# Patient Record
Sex: Female | Born: 1955
Health system: Southern US, Community
[De-identification: ages and names within clinical notes are randomized; demographics above are authoritative.]

## PROBLEM LIST (undated history)

## (undated) DIAGNOSIS — L309 Dermatitis, unspecified: Secondary | ICD-10-CM

## (undated) DIAGNOSIS — M797 Fibromyalgia: Secondary | ICD-10-CM

## (undated) DIAGNOSIS — B370 Candidal stomatitis: Secondary | ICD-10-CM

## (undated) DIAGNOSIS — F419 Anxiety disorder, unspecified: Secondary | ICD-10-CM

## (undated) DIAGNOSIS — C801 Malignant (primary) neoplasm, unspecified: Secondary | ICD-10-CM

## (undated) DIAGNOSIS — N390 Urinary tract infection, site not specified: Secondary | ICD-10-CM

## (undated) DIAGNOSIS — G4730 Sleep apnea, unspecified: Secondary | ICD-10-CM

## (undated) DIAGNOSIS — R7303 Prediabetes: Secondary | ICD-10-CM

## (undated) HISTORY — DX: Urinary tract infection, site not specified: N39.0

## (undated) HISTORY — DX: Candidal stomatitis: B37.0

## (undated) HISTORY — DX: Dermatitis, unspecified: L30.9

## (undated) HISTORY — PX: HERNIA REPAIR: SHX51

---

## 2004-03-30 LAB — HM COLONOSCOPY: HM Colonoscopy: NORMAL

## 2004-06-02 ENCOUNTER — Encounter: Payer: Self-pay | Admitting: Family Medicine

## 2004-10-17 ENCOUNTER — Ambulatory Visit: Payer: Self-pay | Admitting: Oncology

## 2004-12-12 ENCOUNTER — Ambulatory Visit: Payer: Self-pay | Admitting: Oncology

## 2005-02-27 ENCOUNTER — Ambulatory Visit: Payer: Self-pay | Admitting: Oncology

## 2005-04-29 ENCOUNTER — Ambulatory Visit: Payer: Self-pay | Admitting: Oncology

## 2008-09-25 ENCOUNTER — Encounter: Payer: Self-pay | Admitting: Family Medicine

## 2008-10-04 ENCOUNTER — Ambulatory Visit: Payer: Self-pay | Admitting: Family Medicine

## 2008-10-04 DIAGNOSIS — N951 Menopausal and female climacteric states: Secondary | ICD-10-CM | POA: Insufficient documentation

## 2008-10-04 DIAGNOSIS — M797 Fibromyalgia: Secondary | ICD-10-CM | POA: Insufficient documentation

## 2008-10-09 ENCOUNTER — Telehealth: Payer: Self-pay | Admitting: Family Medicine

## 2008-12-04 ENCOUNTER — Ambulatory Visit: Payer: Self-pay | Admitting: Family Medicine

## 2008-12-04 LAB — CONVERTED CEMR LAB: LDL Cholesterol: 108 mg/dL

## 2008-12-06 LAB — CONVERTED CEMR LAB
Direct LDL: 107 mg/dL
Hgb A1c MFr Bld: 5.3 % (ref 4.6–6.5)
Total CHOL/HDL Ratio: 6
VLDL: 40.8 mg/dL — ABNORMAL HIGH (ref 0.0–40.0)

## 2008-12-20 ENCOUNTER — Ambulatory Visit: Payer: Self-pay | Admitting: Family Medicine

## 2008-12-20 DIAGNOSIS — E781 Pure hyperglyceridemia: Secondary | ICD-10-CM | POA: Insufficient documentation

## 2009-01-08 ENCOUNTER — Encounter: Payer: Self-pay | Admitting: Family Medicine

## 2009-01-10 ENCOUNTER — Ambulatory Visit: Payer: Self-pay | Admitting: Family Medicine

## 2009-01-10 ENCOUNTER — Encounter: Payer: Self-pay | Admitting: Family Medicine

## 2009-01-23 ENCOUNTER — Encounter (INDEPENDENT_AMBULATORY_CARE_PROVIDER_SITE_OTHER): Payer: Self-pay | Admitting: *Deleted

## 2009-03-13 ENCOUNTER — Ambulatory Visit: Payer: Self-pay | Admitting: Family Medicine

## 2009-03-15 LAB — CONVERTED CEMR LAB
Total CHOL/HDL Ratio: 6
Vit D, 25-Hydroxy: 20 ng/mL — ABNORMAL LOW (ref 30–89)

## 2009-05-14 ENCOUNTER — Encounter: Payer: Self-pay | Admitting: Family Medicine

## 2009-05-20 ENCOUNTER — Telehealth: Payer: Self-pay | Admitting: Family Medicine

## 2009-06-07 ENCOUNTER — Telehealth: Payer: Self-pay | Admitting: Family Medicine

## 2009-06-20 ENCOUNTER — Telehealth: Payer: Self-pay | Admitting: Family Medicine

## 2009-09-23 ENCOUNTER — Telehealth (INDEPENDENT_AMBULATORY_CARE_PROVIDER_SITE_OTHER): Payer: Self-pay | Admitting: *Deleted

## 2009-10-01 ENCOUNTER — Encounter: Payer: Self-pay | Admitting: Family Medicine

## 2010-01-10 ENCOUNTER — Telehealth (INDEPENDENT_AMBULATORY_CARE_PROVIDER_SITE_OTHER): Payer: Self-pay | Admitting: *Deleted

## 2010-01-14 ENCOUNTER — Ambulatory Visit: Payer: Self-pay | Admitting: Family Medicine

## 2010-01-14 DIAGNOSIS — R5383 Other fatigue: Secondary | ICD-10-CM

## 2010-01-14 DIAGNOSIS — R5381 Other malaise: Secondary | ICD-10-CM | POA: Insufficient documentation

## 2010-01-14 LAB — CONVERTED CEMR LAB
ALT: 23 units/L (ref 0–35)
AST: 21 units/L (ref 0–37)
Alkaline Phosphatase: 95 units/L (ref 39–117)
Bilirubin, Direct: 0.1 mg/dL (ref 0.0–0.3)
Chloride: 107 meq/L (ref 96–112)
Direct LDL: 68 mg/dL
Potassium: 4.2 meq/L (ref 3.5–5.1)
Sodium: 141 meq/L (ref 135–145)
Total Bilirubin: 0.6 mg/dL (ref 0.3–1.2)
Total CHOL/HDL Ratio: 6
Triglycerides: 263 mg/dL — ABNORMAL HIGH (ref 0.0–149.0)

## 2010-01-15 LAB — CONVERTED CEMR LAB: Vit D, 25-Hydroxy: 22 ng/mL — ABNORMAL LOW (ref 30–89)

## 2010-01-16 ENCOUNTER — Ambulatory Visit: Payer: Self-pay | Admitting: Family Medicine

## 2010-01-16 ENCOUNTER — Other Ambulatory Visit: Admission: RE | Admit: 2010-01-16 | Discharge: 2010-01-16 | Payer: Self-pay | Admitting: Family Medicine

## 2010-01-16 LAB — HM PAP SMEAR

## 2010-01-24 LAB — CONVERTED CEMR LAB: Pap Smear: NEGATIVE

## 2010-01-27 ENCOUNTER — Encounter (INDEPENDENT_AMBULATORY_CARE_PROVIDER_SITE_OTHER): Payer: Self-pay | Admitting: *Deleted

## 2010-02-06 ENCOUNTER — Encounter: Payer: Self-pay | Admitting: Family Medicine

## 2010-02-06 ENCOUNTER — Ambulatory Visit: Payer: Self-pay | Admitting: Family Medicine

## 2010-02-14 ENCOUNTER — Encounter: Payer: Self-pay | Admitting: Family Medicine

## 2010-02-14 LAB — HM MAMMOGRAPHY: HM Mammogram: NORMAL

## 2010-05-01 NOTE — Letter (Signed)
Summary: Results Follow up Letter  [place] at [facility]  [address] [place], [place] [address]   Phone: [phone]  Fax: [phone]    01/27/2010 MRN: [id number]     Jillian [name] [address] [place], [place]  [address]    Dear [name],  The following are the results of your recent test(s):  Test         Result    Pap Smear:        Normal __x___  Not Normal _____ Comments:Repeat in 1 year ______________________________________________________ Cholesterol: LDL(Bad cholesterol):         Your goal is less than:         HDL (Good cholesterol):       Your goal is more than: Comments:  ______________________________________________________ Mammogram:        Normal _____  Not Normal _____ Comments:  ___________________________________________________________________ Hemoccult:        Normal _____  Not normal _______ Comments:    _____________________________________________________________________ Other Tests:    We routinely do not discuss normal results over the telephone.  If you desire a copy of the results, or you have any questions about this information we can discuss them at your next office visit.   Sincerely,  Kerby Nora MD

## 2010-05-01 NOTE — Progress Notes (Signed)
Summary: wellbutrin  Phone Note Refill Request Message from:  [employer] on June 20, 2009 7:39 AM  Refills Requested: Medication #1:  WELLBUTRIN SR 150 MG XR12H-TAB Take 1 tablet by mouth two times a day   Supply Requested: 1 month Patient was advised last refill which was on the 11th that she needed a follow up has not been seen in 1 year please advise   Method Requested: Electronic Initial call taken by: Benny Lennert CMA Duncan Dull),  June 20, 2009 7:41 AM  Follow-up for Phone Call        No further refills until CPE appt made. (overdue per my records).refill untill appt day.  Follow-up by: Kerby Nora MD,  June 20, 2009 11:38 AM  Additional Follow-up for Phone Call Additional follow up Details #1::        Pt has been seen within the last year, she had a physical in september.  She is upset that this has not been refilled. OK to send in refills until 9/11?      Lowella Petties CMA  June 21, 2009 11:30 AM  I don't know how Herbert Seta and i both missed that..okay to refill one year. Please call and apologize to patient for the mistake.   Additional Follow-up by: Kerby Nora MD,  June 21, 2009 12:52 PM    Prescriptions: WELLBUTRIN SR 150 MG XR12H-TAB (BUPROPION HCL) Take 1 tablet by mouth two times a day  #60 x 6   Entered by:   Benny Lennert CMA (AAMA)   Authorized by:   Kerby Nora MD   Signed by:   Benny Lennert CMA (AAMA) on 06/21/2009   Method used:   Electronically to        [facility] [address] [phone]* (retail)       [address], [place]  [address]       Ph: [phone]       Fax: [phone]   RxID:   [id number]

## 2010-05-01 NOTE — Progress Notes (Signed)
  Phone Note Refill Request Message from:  [employer] on May 20, 2009 2:40 PM  Refills Requested: Medication #1:  TRAZODONE HCL 50 MG TABS Takes 25 - 50 mg. at bedtime   Supply Requested: 1 month [facility] [address]   Method Requested: Telephone to Pharmacy Initial call taken by: Benny Lennert CMA Duncan Dull),  May 20, 2009 2:40 PM  Follow-up for Phone Call        Rx called to pharmacy Follow-up by: Benny Lennert CMA Duncan Dull),  May 21, 2009 8:34 AM    Prescriptions: TRAZODONE HCL 50 MG TABS (TRAZODONE HCL) Takes 25 - 50 mg. at bedtime  #30 x 3   Entered and Authorized by:   Kerby Nora MD   Signed by:   Kerby Nora MD on 05/20/2009   Method used:   Telephoned to ...       [facility] [address] [phone]* (retail)       [address] [place]  [address]       Ph: [phone]       Fax: [phone]   RxID:   [id number]

## 2010-05-01 NOTE — Progress Notes (Signed)
Summary: Rx Wellbutrin  Phone Note Refill Request Call back at [phone] Message from:  [facility] on June 07, 2009 7:47 AM  Refills Requested: Medication #1:  WELLBUTRIN SR 150 MG XR12H-TAB Take 1 tablet by mouth two times a day   Last Refilled: 05/07/2009 Received faxed refill request, please advise   Method Requested: Electronic Initial call taken by: Linde Gillis CMA [place] Dull),  June 07, 2009 7:48 AM  Follow-up for Phone Call        no office visit > 1 year, f/u with Dr. Ermalene Searing indicated  cc: Dr. Ermalene Searing Follow-up by: Hannah Beat MD,  June 07, 2009 8:09 AM  Additional Follow-up for Phone Call Additional follow up Details #1::        Patient advised as instructed.  She is very upset says that she doesn't understand why Dr. Patsy Lager will not fill her medication while Dr. Ermalene Searing is out.  She will be out of medication completely if she waits for Dr. Ermalene Searing to return.  She says if this is the kind treatment she will continue to get from our office regarding filling her medications, she may think about changing doctors.   Additional Follow-up by: Linde Gillis CMA Duncan Dull),  June 07, 2009 8:46 AM    Additional Follow-up for Phone Call Additional follow up Details #2::    ok to call in refill, #30, 0 refills for 1 month only.  Routine follow-up yearly minimally is needed for chronic medications, particularly antidepressants. Do not want her to run out acutely.  F/u Dr. Ermalene Searing  Follow-up by: Hannah Beat MD,  June 07, 2009 9:22 AM  Additional Follow-up for Phone Call Additional follow up Details #3:: Details for Additional Follow-up Action Taken: Refill called into [facility], and patient notified.   Additional Follow-up by: Linde Gillis CMA Duncan Dull),  June 07, 2009 9:37 AM

## 2010-05-01 NOTE — Letter (Signed)
Summary: [facility]  [facility]   Imported By: Lanelle Bal 10/09/2009 12:33:04  _____________________________________________________________________  External Attachment:    Type:   Image     Comment:   External Document

## 2010-05-01 NOTE — Letter (Signed)
Summary: [facility]  [facility]   Imported By: Lanelle Bal 05/21/2009 09:45:13  _____________________________________________________________________  External Attachment:    Type:   Image     Comment:   External Document

## 2010-05-01 NOTE — Letter (Signed)
Summary: Results Follow up Letter  [place] at [facility]  [address] [place], [place] [address]   Phone: [phone]  Fax: [phone]    02/14/2010 MRN: [id number]  Rosiland Vonbargen [address] [place], [place]  [address]  Dear Ms. FUNDERBURG,  The following are the results of your recent test(s):  Test         Result    Pap Smear:        Normal _____  Not Normal _____ Comments: ______________________________________________________ Cholesterol: LDL(Bad cholesterol):         Your goal is less than:         HDL (Good cholesterol):       Your goal is more than: Comments:  ______________________________________________________ Mammogram:        Normal __X___  Not Normal _____ Comments: Repeat in one year.  ___________________________________________________________________ Hemoccult:        Normal _____  Not normal _______ Comments:    _____________________________________________________________________ Other Tests:    We routinely do not discuss normal results over the telephone.  If you desire a copy of the results, or you have any questions about this information we can discuss them at your next office visit.   Sincerely,      Dr. Karleen [place] Copland

## 2010-05-01 NOTE — Assessment & Plan Note (Signed)
Summary: cpx/alc   Vital Signs:  Patient profile:   55 year old female Height:      68 inches Weight:      195.0 pounds BMI:     29.76 Temp:     98.6 degrees F oral Pulse rate:   84 / minute Pulse rhythm:   regular BP sitting:   100 / 70  (left arm) Cuff size:   large  Vitals Entered By: Benny Lennert CMA Duncan Dull) (January 16, 2010 11:14 AM)  History of Present Illness: Chief complaint cpx (3 negative pap)   The patient is here for annual wellness exam and preventative care.     Doing well overall. No acute concerns.  Has been in weight watchers in last few months.  Has lost 17 lbs in last year. Exercising 20 minutes 5 tmes a week walking.  Fibromyalgia, well controlled on wellbutrin, lyrica, tramadol and vitamin supplements.  Seeing Dr. Corliss Skains for this issue.  Working Community education officer.   Prediabetes resolved.Marland Kitchen FBS went from 105 to 90.  Problems Prior to Update: 1)  Neoplasm, Malignant, Colon, Family Hx, Father  (ICD-V16.0) 2)  Screening, Colon Cancer  (ICD-V76.51) 3)  Other Malaise and Fatigue  (ICD-780.79) 4)  Physical Examination  (ICD-V70.0) 5)  Routine Gynecological Examination  (ICD-V72.31) 6)  Other Screening Mammogram  (ICD-V76.12) 7)  Hypertriglyceridemia  (ICD-272.1) 8)  Menopause, Early  (ICD-627.2) 9)  R/O Fibroids, Uterus  (ICD-218.9) 10)  Fibromyalgia  (ICD-729.1)  Current Medications (verified): 1)  Wellbutrin Sr 150 Mg Xr12h-Tab (Bupropion Hcl) .... Take 1 Tablet By Mouth Two Times A Day 2)  Lyrica 75 Mg Caps (Pregabalin) .... Take 1 Tablet By Mouth Three Times A Day 3)  Tramadol Hcl 50 Mg Tabs (Tramadol Hcl) .... Take 2 Tablets By Mouth Two Times A Day  (May Go To 6 Tabs For Flares). 4)  Calcium Carbonate-Vitamin D 600-400 Mg-Unit  Tabs (Calcium Carbonate-Vitamin D) .... Take 1 Tablet By Mouth Three Times A Day With Meals 5)  Vitamin D (Ergocalciferol) 50000 Unit Caps (Ergocalciferol) .Marland Kitchen.. 1 Cap By Mouth Weekly X 6 Weeks 6)  Magnesium Malate  Powd  (Magnesium Malate) .... Take 1 Tablet By Mouth Three Times A Day With Meals 7)  Manganese 15 Mg Tabs (Manganese) .... 40 or 50 Mg.  Takes 1 Tablet At [employer] [facility] 8)  Thiamine Hcl 100 Mg Tabs (Thiamine Hcl) .... Takes 1 Tablet At [facility] 9)  Co Q 10 60 Mg Caps (Coenzyme Q10) .... Take 1 Capsule By Mouth Three Times A Day With Meals. 10)  Vitamin D3 50000 Unit Caps (Cholecalciferol) .Marland Kitchen.. 1 Cap By Mouth Weekly X 12 Weeks  Allergies: 1)  ! * All Grains 2)  ! * Yeast 3)  ! * Soy 4)  ! * Coffee 5)  ! * Vinegar 6)  ! * All Food Additives 7)  ! * Sugar Substitutes  Past History:  Past medical, surgical, family and social histories (including risk factors) reviewed, and no changes noted (except as noted below).  Past Surgical History: 2005 ventral hernia repair 2006 Bx of lymph nodes for ? sarcoid lymphoma per Dr. Honor Junes Onc.Marland KitchenMarland KitchenHad extensive work up..negative except fluctuating lymph node size. Neg neuro and rheum work up. 2009 MRI pelvis: no fibroid, no cancer seen...she just has uterus that is retroverted and protrudes on exam  Family History: Reviewed history from 10/04/2008 and no changes required. father: colon cancer age 65, HTN, high cholesterol mother: healthy 8 siblings, 4 with DM, 1 with HTN MGM: colon  cancer, HTN aunt colon cancer PGF: anuerysm no MI early age  Social History: Reviewed history from 10/04/2008 and no changes required. Occupation: Charity fundraiser works in [employer] Married 2 children: healthy except some OCD, anxiety, depression Never Smoked Alcohol use-no Drug use-no Regular exercise-yes, but seasonal, walks, swims Diet: healthy, low carbohydrates, n caffeine  Review of Systems General:  Denies fatigue and fever. CV:  Denies chest pain or discomfort. Resp:  Denies shortness of breath. GI:  Denies abdominal pain, bloody stools, constipation, and diarrhea. GU:  Denies abnormal vaginal bleeding and dysuria. Derm:  Denies lesion(s). Psych:  Denies anxiety and  depression.  Physical Exam  General:  Well-developed,well-nourished,in no acute distress; alert,appropriate and cooperative throughout examination Ears:  External ear exam shows no significant lesions or deformities.  Otoscopic examination reveals clear canals, tympanic membranes are intact bilaterally without bulging, retraction, inflammation or discharge. Hearing is grossly normal bilaterally. Nose:  External nasal examination shows no deformity or inflammation. Nasal mucosa are pink and moist without lesions or exudates. Mouth:  Oral mucosa and oropharynx without lesions or exudates.  Teeth in good repair. Neck:  no carotid bruit or thyromegaly no cervical or supraclavicular lymphadenopathy  Chest Wall:  No deformities, masses, or tenderness noted. Breasts:  No mass, nodules, thickening, tenderness, bulging, retraction, inflamation, nipple discharge or skin changes noted.   Lungs:  Normal respiratory effort, chest expands symmetrically. Lungs are clear to auscultation, no crackles or wheezes. Heart:  Normal rate and regular rhythm. S1 and S2 normal without gallop, murmur, click, rub or other extra sounds. Abdomen:  Bowel sounds positive,abdomen soft and non-tender without masses, organomegaly or hernias noted. Genitalia:  Pelvic Exam:        External: normal female genitalia without lesions or masses        Vagina: normal without lesions or masses                Adnexa: normal bimanual exam without masses or fullness        Uterus: firm area, stable palpated approx midway to umbilicus        Pap smear: not performed Pulses:  R and L posterior tibial pulses are full and equal bilaterally  Extremities:  no edmea  Neurologic:  No cranial nerve deficits noted. Station and gait are normal. DTRs are symmetrical throughout. Sensory, motor and coordinative functions appear intact. Skin:  Intact without suspicious lesions or rashes Psych:  Cognition and judgment appear intact. Alert and  cooperative with normal attention span and concentration. No apparent delusions, illusions, hallucinations   Impression & Recommendations:  Problem # 1:  PHYSICAL EXAMINATION (ICD-V70.0) The patient's preventative maintenance and recommended screening tests for an annual wellness exam were reviewed in full today. Brought up to date unless services declined.  Counselled on the importance of diet, exercise, and its role in overall health and mortality. The patient's FH and SH was reviewed, including their home life, tobacco status, and drug and alcohol status.     Problem # 2:  ROUTINE GYNECOLOGICAL EXAMINATION (ICD-V72.31) PAP pending.     Problem # 3:  HYPERTRIGLYCERIDEMIA (ICD-272.1) Encouraged exercise, weight loss, healthy eating habits. Increase fish oil. Rechek in 6 months.   Problem # 4:  R/O FIBROIDS, UTERUS (ICD-218.9) Thought to have fibroid oby PE at previous MD and here..seen on [place] but on pelvic MRI...no fibroid, no abnormality..thought to be retroverted uterus/artifact.   Complete Medication List: 1)  Wellbutrin Sr 150 Mg Xr12h-tab (Bupropion hcl) .... Take 1 tablet by mouth two  times a day 2)  Lyrica 75 Mg Caps (Pregabalin) .... Take 1 tablet by mouth three times a day 3)  Tramadol Hcl 50 Mg Tabs (Tramadol hcl) .... Take 2 tablets by mouth two times a day  (may go to 6 tabs for flares). 4)  Calcium Carbonate-vitamin D 600-400 Mg-unit Tabs (Calcium carbonate-vitamin d) .... Take 1 tablet by mouth three times a day with meals 5)  Vitamin D (ergocalciferol) 50000 Unit Caps (Ergocalciferol) .Marland Kitchen.. 1 cap by mouth weekly x 6 weeks 6)  Magnesium Malate Powd (Magnesium malate) .... Take 1 tablet by mouth three times a day with meals 7)  Manganese 15 Mg Tabs (Manganese) .... 40 or 50 mg.  takes 1 tablet at dinner 8)  Thiamine Hcl 100 Mg Tabs (Thiamine hcl) .... Takes 1 tablet at dinner 9)  Co Q 10 60 Mg Caps (Coenzyme q10) .... Take 1 capsule by mouth three times a day with  meals. 10)  Vitamin D3 50000 Unit Caps (Cholecalciferol) .Marland Kitchen.. 1 cap by mouth weekly x 12 weeks  Other Orders: Gastroenterology Referral (GI) Radiology Referral (Radiology)  Patient Instructions: 1)  Increase fish oil to 2000 -3000mg  divided daily 2)  Recheck fasting LIPIDS in 6 months Dx 272.0    3)  Referral Appointment Information 4)  Day/Date: 5)  Time: 6)  Place/MD: 7)  Address: 8)  Phone/Fax: 9)  Patient given appointment information. Information/Orders faxed/mailed. 10)    11)  Please schedule a follow-up appointment in 1 year.  Prescriptions: VITAMIN D3 50000 UNIT CAPS (CHOLECALCIFEROL) 1 cap by mouth weekly x 12 weeks  #12 x 0   Entered and Authorized by:   Kerby Nora MD   Signed by:   Kerby Nora MD on 01/16/2010   Method used:   Electronically to        [facility] [address] (retail)       [address]       [place], [place]  [address]       Ph: [phone]       Fax: [phone]   RxID:   [id number]    Orders Added: 1)  Gastroenterology Referral [GI] 2)  Est. Patient 40-64 years [phone] 3)  Radiology Referral [Radiology]    Current Allergies (reviewed today): ! * ALL GRAINS ! * YEAST ! * SOY ! * COFFEE ! * VINEGAR ! * ALL FOOD ADDITIVES ! * SUGAR SUBSTITUTES Flu Vaccine Next Due:  Refused TD Result Date:  03/30/2004 TD Result:  given TD Next Due:  10 yr      Past Surgical History:    Reviewed history from 10/04/2008 and no changes required:       2005 ventral hernia repair       2006 Bx of lymph nodes for ? sarcoid lymphoma per Dr. Honor Junes Onc.Marland KitchenMarland KitchenHad extensive work up..negative except fluctuating lymph node size. Neg neuro and rheum work up.       2009 MRI pelvis: no fibroid, no cancer seen...she just has uterus that is retroverted and protrudes on exam

## 2010-05-01 NOTE — Progress Notes (Signed)
----   Converted from flag ---- ---- 01/08/2010 12:26 PM, Kerby Nora MD wrote: Dx 272.0 CMET, lipids, vit D Dx 780.79  ---- 01/08/2010 12:05 PM, Liane Comber CMA (AAMA) wrote: Lab orders please! Good Morning! This pt is scheduled for cpx labs Tuesday, which labs to draw and dx codes to use? Thanks Tasha ------------------------------

## 2010-05-01 NOTE — Progress Notes (Signed)
  Phone Note Call from Patient Call back at Home Phone [phone]   Caller: Patient Call For: Aram Beecham / Jamesetta So Summary of Call: Patient phoned in this morning saying that she had received a notice saying that she had missed her lab appointment on 09/18/2009.  She says she never had that appointment because she didn't have it written down.  I tracked the appointment back from her last CPX in September where she was instructed to have labs again in 3 months (December 2010) and she had those drawn.  The results from that lab report instructed her to have labs again in 6 months (June 2011) which is this appointment in question.  Heather scheduled the appointment at that time and the patient agreed that she had heard from those labs because she was aware to increase her Vitamin D supplement which was part of the instructions at that time.  She was concerned about a charge for this missed appointment.  I explained that I did not know if there was a charge for missed lab appointments and that I could let her talk to Cynthia/Phyllis but she said she didn't want to talk to anyone, she just wasn't going to pay the bill. She then asked if she should reschedule the lab appointment and I told her that Dr. Ermalene Searing had requested that lab in 6 months.  She then asked if she could refuse to reschedule and I told her that was her decision but the 6 months lab was Dr. Daphine Deutscher plan of treatment.  She then said she was going to refuse because her cholesterol was fine and this was a waste of money for [employer].  Initial call taken by: Delilah Shan CMA (AAMA),  September 23, 2009 9:01 AM     Appended Document:  FYI to Dr. Ermalene Searing.... Pt has decline to repeat labs in and 6 mths.Marland KitchenMarland KitchenMarland KitchenSee office notes.Marland KitchenMarland KitchenCdavis 09-23-2009   Appended Document: Orders Update Cholesterol recommended to be checked in 6 months given HDL not at goal and previous high triglycerides.Marland Kitchenon fish oil.  If she does not wish to assure  improvement of HDl...that is fine.  Recheck 1 year from last date. Kerby Nora MD  September 24, 2009 1:21 PM   Clinical Lists Changes

## 2010-07-09 ENCOUNTER — Other Ambulatory Visit: Payer: Self-pay | Admitting: Family Medicine

## 2010-07-09 DIAGNOSIS — E7800 Pure hypercholesterolemia, unspecified: Secondary | ICD-10-CM

## 2010-07-10 ENCOUNTER — Other Ambulatory Visit: Payer: Self-pay | Admitting: Family Medicine

## 2010-07-11 ENCOUNTER — Other Ambulatory Visit: Payer: Self-pay | Admitting: *Deleted

## 2010-07-14 ENCOUNTER — Other Ambulatory Visit: Payer: Self-pay

## 2010-08-17 ENCOUNTER — Other Ambulatory Visit: Payer: Self-pay | Admitting: Family Medicine

## 2010-10-06 ENCOUNTER — Telehealth: Payer: Self-pay | Admitting: *Deleted

## 2010-10-06 DIAGNOSIS — Z86010 Personal history of colonic polyps: Secondary | ICD-10-CM

## 2010-10-06 NOTE — Telephone Encounter (Signed)
Pt wants referral for colonoscopy, she wants to see Dr. Mechele Collin.  Her last one was about 6 years ago.

## 2010-10-06 NOTE — Telephone Encounter (Signed)
Pre-visit for Colonoscopy at [facility] with Fransico Setters on 11/20/2010 at 9:15am.

## 2010-11-28 ENCOUNTER — Other Ambulatory Visit: Payer: Self-pay | Admitting: Family Medicine

## 2010-12-26 ENCOUNTER — Ambulatory Visit: Payer: Self-pay | Admitting: Unknown Physician Specialty

## 2011-01-05 ENCOUNTER — Encounter: Payer: Self-pay | Admitting: Family Medicine

## 2011-03-06 ENCOUNTER — Telehealth: Payer: Self-pay | Admitting: Family Medicine

## 2011-03-06 ENCOUNTER — Other Ambulatory Visit (INDEPENDENT_AMBULATORY_CARE_PROVIDER_SITE_OTHER): Payer: PRIVATE HEALTH INSURANCE

## 2011-03-06 DIAGNOSIS — E7800 Pure hypercholesterolemia, unspecified: Secondary | ICD-10-CM

## 2011-03-06 DIAGNOSIS — E781 Pure hyperglyceridemia: Secondary | ICD-10-CM

## 2011-03-06 LAB — LIPID PANEL: HDL: 35.6 mg/dL — ABNORMAL LOW (ref >39.00)

## 2011-03-06 LAB — COMPREHENSIVE METABOLIC PANEL
Albumin: 4.1 g/dL (ref 3.5–5.2)
CO2: 28 mEq/L (ref 19–32)
Calcium: 9.3 mg/dL (ref 8.4–10.5)
GFR: 58.13 mL/min — ABNORMAL LOW (ref >60.00)
Glucose, Bld: 90 mg/dL (ref 70–99)
Sodium: 140 mEq/L (ref 135–145)
Total Bilirubin: 0.6 mg/dL (ref 0.3–1.2)
Total Protein: 6.7 g/dL (ref 6.0–8.3)

## 2011-03-06 NOTE — Telephone Encounter (Signed)
Message copied by Excell Seltzer on Fri Mar 06, 2011  8:21 AM ------      Message from: [place], [place] J      Created: Thu Feb 26, 2011 12:51 PM      Regarding: orders for 12.7       Patient is scheduled for CPX labs, please order future labs, Thanks , Camelia Eng

## 2011-03-12 ENCOUNTER — Other Ambulatory Visit: Payer: Self-pay | Admitting: Family Medicine

## 2011-03-13 ENCOUNTER — Encounter: Payer: Self-pay | Admitting: Family Medicine

## 2011-03-25 ENCOUNTER — Encounter: Payer: Self-pay | Admitting: Family Medicine

## 2011-03-26 ENCOUNTER — Encounter: Payer: Self-pay | Admitting: Family Medicine

## 2011-03-26 ENCOUNTER — Ambulatory Visit (INDEPENDENT_AMBULATORY_CARE_PROVIDER_SITE_OTHER): Payer: PRIVATE HEALTH INSURANCE | Admitting: Family Medicine

## 2011-03-26 DIAGNOSIS — E781 Pure hyperglyceridemia: Secondary | ICD-10-CM

## 2011-03-26 DIAGNOSIS — IMO0001 Reserved for inherently not codable concepts without codable children: Secondary | ICD-10-CM

## 2011-03-26 NOTE — Progress Notes (Signed)
Subjective:    Patient ID: Jillian [name], female    DOB: 11-23-1955, 55 y.o.   MRN: [id number]  HPI  The patient is here for annual wellness exam and preventative care.    Fibromyalgia, well controlled on wellbutrin, lyrica (three times a day), tramadol  (three times a day) and vitamin supplements.  Also on wellbutrin (mood well controlled) Seeing Dr. Corliss Skains for this issue.  Working Community education officer.  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  Hypertriglyceridemia: Improved from last year,almost at goal <150 on fish oil 2000 mg divided daily.  LDL at goal, HDL low but increase 10 points from last year. Regular exercise, healthy eating habits.. But decreased some over holidays.  Review of Systems  Constitutional: Negative for fever, fatigue and unexpected weight change.  HENT: Negative for ear pain, congestion, sore throat, sneezing, trouble swallowing and sinus pressure.   Eyes: Negative for pain and itching.  Respiratory: Negative for cough, shortness of breath and wheezing.   Cardiovascular: Negative for chest pain, palpitations and leg swelling.  Gastrointestinal: Negative for nausea, abdominal pain, diarrhea, constipation and blood in stool.  Genitourinary: Negative for dysuria, hematuria, vaginal discharge, difficulty urinating and menstrual problem.  Skin: Negative for rash.  Neurological: Negative for syncope, weakness, light-headedness, numbness and headaches.  Psychiatric/Behavioral: Negative for confusion and dysphoric mood. The patient is not nervous/anxious.        Objective:   Physical Exam  Constitutional: Vital signs are normal. She appears well-developed and well-nourished. She is cooperative.  Non-toxic appearance. She does not appear ill. No distress.  HENT:  Head: Normocephalic.  Right Ear: Hearing, tympanic membrane, external ear and ear canal normal.  Left Ear: Hearing, tympanic membrane, external ear and ear canal normal.  Nose: Nose normal.  Eyes:  Conjunctivae, EOM and lids are normal. Pupils are equal, round, and reactive to light. No foreign bodies found.  Neck: Trachea normal and normal range of motion. Neck supple. Carotid bruit is not present. No mass and no thyromegaly present.  Cardiovascular: Normal rate, regular rhythm, S1 normal, S2 normal, normal heart sounds and intact distal pulses.  Exam reveals no gallop.   No murmur heard. Pulmonary/Chest: Effort normal and breath sounds normal. No respiratory distress. She has no wheezes. She has no rhonchi. She has no rales.  Abdominal: Soft. Normal appearance and bowel sounds are normal. She exhibits no distension, no fluid wave, no abdominal bruit and no mass. There is no hepatosplenomegaly. There is no tenderness. There is no rebound, no guarding and no CVA tenderness. No hernia.  Genitourinary: Rectum normal, vagina normal and uterus normal. No breast swelling, tenderness, discharge or bleeding. Pelvic exam was performed with patient prone. There is no rash, tenderness or lesion on the right labia. There is no rash, tenderness or lesion on the left labia. Uterus is not enlarged and not tender. Right adnexum displays no mass, no tenderness and no fullness. Left adnexum displays no mass, no tenderness and no fullness.       No pap performed.. On 2 year schedule.  Lymphadenopathy:    She has no cervical adenopathy.    She has no axillary adenopathy.  Neurological: She is alert. She has normal strength. No cranial nerve deficit or sensory deficit.  Skin: Skin is warm, dry and intact. No rash noted.  Psychiatric: Her speech is normal and behavior is normal. Judgment normal. Her mood appears not anxious. Cognition and memory are normal. She does not exhibit a depressed mood.  Assessment & Plan:  CPX: The patient's preventative maintenance and recommended screening tests for an annual wellness exam were reviewed in full today. Brought up to date unless services  declined.  Counselled on the importance of diet, exercise, and its role in overall health and mortality. The patient's FH and SH was reviewed, including their home life, tobacco status, and drug and alcohol status.   Vaccines:Uptodate Td. Flu refused due to SE in past. Mammo: plans every 2 years..no family history, no personal changes or fibrocystic disease. DVE/pap:nml pap/dve 12/2009 on q2  year schedule. Colon: father with colon cancer age 33.. Colonoscopy 12/26/2010: no polyps, on q 5 year schedule.

## 2011-03-26 NOTE — Patient Instructions (Signed)
Continue working on exercise, weight loss and healthy eating.

## 2011-03-26 NOTE — Assessment & Plan Note (Signed)
Improved

## 2011-03-26 NOTE — Assessment & Plan Note (Signed)
Stable control on current regimen.  Last note reviewed from Dr. Algis Downs..

## 2011-04-15 ENCOUNTER — Other Ambulatory Visit: Payer: Self-pay | Admitting: Family Medicine

## 2011-05-18 ENCOUNTER — Other Ambulatory Visit: Payer: Self-pay | Admitting: Family Medicine

## 2011-06-23 ENCOUNTER — Other Ambulatory Visit: Payer: Self-pay | Admitting: Family Medicine

## 2011-11-10 ENCOUNTER — Telehealth: Payer: Self-pay | Admitting: *Deleted

## 2011-11-10 NOTE — Telephone Encounter (Signed)
Agree with cipro for work.

## 2011-11-10 NOTE — Telephone Encounter (Signed)
Pt walked in complaining of UTI type symptoms- pain and pressure after urinating, mild nausea, small amounts of blood in her urine.  A nurse at her job gave her cipro 250 mg's, twice daily for 3 days.  She has taken one so far today.  She asks if this is ok for her to take, states she doesn't have an allergy to cipro.  Advised ok for her to try, but offered appt tomorrow morning for UA and culture.  Pt states she is going to try the cipro and see how she feels- she is very busy at work and it would be difficult for her to come in tomorrow.  Advised her to drink lots of water also and call back with any concerns or if not better.

## 2012-01-28 ENCOUNTER — Other Ambulatory Visit: Payer: Self-pay | Admitting: Family Medicine

## 2012-01-29 NOTE — Telephone Encounter (Signed)
Pt has a cpx scheduled for 04/02/12, I refilled meds until that appt.

## 2012-03-31 ENCOUNTER — Ambulatory Visit (INDEPENDENT_AMBULATORY_CARE_PROVIDER_SITE_OTHER): Payer: PRIVATE HEALTH INSURANCE | Admitting: Family Medicine

## 2012-03-31 ENCOUNTER — Other Ambulatory Visit (HOSPITAL_COMMUNITY)
Admission: RE | Admit: 2012-03-31 | Discharge: 2012-03-31 | Disposition: A | Discharge status: Home / Self Care | Payer: PRIVATE HEALTH INSURANCE | Source: Ambulatory Visit | Attending: Family Medicine | Admitting: Family Medicine

## 2012-03-31 ENCOUNTER — Encounter: Payer: Self-pay | Admitting: Family Medicine

## 2012-03-31 VITALS — BP 120/72 | HR 90 | Temp 98.5°F | Ht 68.0 in | Wt 201.0 lb

## 2012-03-31 DIAGNOSIS — Z01419 Encounter for gynecological examination (general) (routine) without abnormal findings: Secondary | ICD-10-CM

## 2012-03-31 DIAGNOSIS — Z1231 Encounter for screening mammogram for malignant neoplasm of breast: Secondary | ICD-10-CM

## 2012-03-31 DIAGNOSIS — E781 Pure hyperglyceridemia: Secondary | ICD-10-CM

## 2012-03-31 NOTE — Assessment & Plan Note (Signed)
Due for lab re-eval. Encouraged exercise, weight loss, healthy eating habits.

## 2012-03-31 NOTE — Patient Instructions (Addendum)
Return for fasting labs in next week or so.  Get back on track with regular exercise and healthy eating. Stop at front desk to schedule mammogram

## 2012-03-31 NOTE — Progress Notes (Signed)
The patient is here for annual wellness exam and preventative care.   Fibromyalgia, well controlled on wellbutrin, lyrica (three times a day), tramadol (three times a day) and vitamin supplements. Also on wellbutrin (mood well controlled)  Seeing Dr. Corliss Skains for this issue.Marland Kitchen Has next appt in next few months. 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   Hypertriglyceridemia: Due for re-eval. Lab Results  Component Value Date   CHOL 169 03/06/2011   HDL 35.60* 03/06/2011   LDLCALC 103* 03/06/2011   LDLDIRECT 68.0 01/14/2010   TRIG 151.0* 03/06/2011   CHOLHDL 5 03/06/2011   Minimal exercise,  Usually healthy eating habits.. But decreased some over holidays.   Review of Systems  Constitutional: Negative for fever, fatigue and unexpected weight change.  HENT: Negative for ear pain, congestion, sore throat, sneezing, trouble swallowing and sinus pressure.  Eyes: Negative for pain and itching.  Respiratory: Negative for cough, shortness of breath and wheezing.  Cardiovascular: Negative for chest pain, palpitations and leg swelling.  Gastrointestinal: Negative for nausea, abdominal pain, diarrhea, constipation and blood in stool.  Genitourinary: Negative for dysuria, hematuria, vaginal discharge, difficulty urinating and menstrual problem.  Skin: Negative for rash.  Neurological: Negative for syncope, weakness, light-headedness, numbness and headaches.  Psychiatric/Behavioral: Negative for confusion and dysphoric mood. The patient is not nervous/anxious.   Objective:   Physical Exam  Constitutional: Vital signs are normal. She appears well-developed and well-nourished. She is cooperative. Non-toxic appearance. She does not appear ill. No distress.  HENT:  Head: Normocephalic.  Right Ear: Hearing, tympanic membrane, external ear and ear canal normal.  Left Ear: Hearing, tympanic membrane, external ear and ear canal normal.  Nose: Nose normal.  Eyes: Conjunctivae, EOM and lids are  normal. Pupils are equal, round, and reactive to light. No foreign bodies found.  Neck: Trachea normal and normal range of motion. Neck supple. Carotid bruit is not present. No mass and no thyromegaly present.  Cardiovascular: Normal rate, regular rhythm, S1 normal, S2 normal, normal heart sounds and intact distal pulses. Exam reveals no gallop.  No murmur heard.  Pulmonary/Chest: Effort normal and breath sounds normal. No respiratory distress. She has no wheezes. She has no rhonchi. She has no rales.  Abdominal: Soft. Normal appearance and bowel sounds are normal. She exhibits no distension, no fluid wave, no abdominal bruit and no mass. There is no hepatosplenomegaly. There is no tenderness. There is no rebound, no guarding and no CVA tenderness. No hernia.  Genitourinary: Rectum normal, vagina normal and uterus normal. No breast swelling, tenderness, discharge or bleeding. Pelvic exam was performed with patient supine. There is no rash, tenderness or lesion on the right labia. There is no rash, tenderness or lesion on the left labia. Uterus is not enlarged and not tender. Right adnexum displays no mass, no tenderness and no fullness. Left adnexum displays no mass, no tenderness and no fullness.  Pap performed. Cervix normal, internal vaginal exam nml Lymphadenopathy:  She has no cervical adenopathy.  She has no axillary adenopathy.  Neurological: She is alert. She has normal strength. No cranial nerve deficit or sensory deficit.  Skin: Skin is warm, dry and intact. No rash noted.  Psychiatric: Her speech is normal and behavior is normal. Judgment normal. Her mood appears not anxious. Cognition and memory are normal. She does not exhibit a depressed mood.   Assessment & Plan:   CPX: The patient's preventative maintenance and recommended screening tests for an annual wellness exam were reviewed in full today.  Brought up to date unless services declined.  Counselled on the importance of diet,  exercise, and its role in overall health and mortality.  The patient's FH and SH was reviewed, including their home life, tobacco status, and drug and alcohol status.   Vaccines: Uptodate Td. Flu refused due to SE in past.  Mammo: plans every 2 years..no family history, no personal changes or fibrocystic disease. Last mammo 2011  DVE/pap:nml pap/dve 12/2009 on q2 year schedule, due this year 2013 after this every 3 years. Colon: father with colon cancer age 53.. Colonoscopy 12/26/2010: no polyps, on q 5 year schedule. Nonsmoker Bone density: went through early menopause but no other  risk factors. Begin screening at age 26.

## 2012-04-05 ENCOUNTER — Encounter: Payer: Self-pay | Admitting: *Deleted

## 2012-04-11 ENCOUNTER — Other Ambulatory Visit (INDEPENDENT_AMBULATORY_CARE_PROVIDER_SITE_OTHER): Payer: PRIVATE HEALTH INSURANCE

## 2012-04-11 DIAGNOSIS — E781 Pure hyperglyceridemia: Secondary | ICD-10-CM

## 2012-04-11 LAB — COMPREHENSIVE METABOLIC PANEL
ALT: 31 U/L (ref 0–35)
Albumin: 3.8 g/dL (ref 3.5–5.2)
Alkaline Phosphatase: 69 U/L (ref 39–117)
Potassium: 4.5 mEq/L (ref 3.5–5.1)
Sodium: 141 mEq/L (ref 135–145)
Total Bilirubin: 0.5 mg/dL (ref 0.3–1.2)
Total Protein: 6.5 g/dL (ref 6.0–8.3)

## 2012-04-11 LAB — LDL CHOLESTEROL, DIRECT: Direct LDL: 87.1 mg/dL

## 2012-04-11 LAB — LIPID PANEL
Total CHOL/HDL Ratio: 6
VLDL: 43 mg/dL — ABNORMAL HIGH (ref 0.0–40.0)

## 2012-04-12 ENCOUNTER — Encounter: Payer: Self-pay | Admitting: *Deleted

## 2012-05-21 ENCOUNTER — Other Ambulatory Visit: Payer: Self-pay | Admitting: Family Medicine

## 2012-06-06 ENCOUNTER — Telehealth: Payer: Self-pay | Admitting: Family Medicine

## 2012-06-06 NOTE — Telephone Encounter (Signed)
Caller: Reena/Patient; Phone: [phone]; Reason for Call: Patient states she was last seen in Jan 2014 and at that time she has discussed with provider about changing her Wellbutrin to Cymbalta.  Pt reports she would like to give Cymbalta a try.  PLEASE F/U WITH PT, THANK YOU.

## 2012-06-07 MED ORDER — DULOXETINE HCL 30 MG PO CPEP: ORAL_CAPSULE | ORAL | Status: DC

## 2012-06-07 NOTE — Telephone Encounter (Signed)
Have pt stop wellbutrin then start cymbalta 30 mg the first day she is off wellbutrin so there are no days off any med.

## 2012-06-07 NOTE — Telephone Encounter (Signed)
Patient advised.

## 2012-06-15 ENCOUNTER — Ambulatory Visit: Payer: Self-pay | Admitting: Family Medicine

## 2012-06-15 ENCOUNTER — Encounter: Payer: Self-pay | Admitting: Family Medicine

## 2012-06-16 ENCOUNTER — Encounter: Payer: Self-pay | Admitting: *Deleted

## 2012-06-30 ENCOUNTER — Ambulatory Visit (INDEPENDENT_AMBULATORY_CARE_PROVIDER_SITE_OTHER): Payer: PRIVATE HEALTH INSURANCE | Admitting: Family Medicine

## 2012-06-30 ENCOUNTER — Encounter: Payer: Self-pay | Admitting: Family Medicine

## 2012-06-30 VITALS — BP 100/80 | HR 84 | Temp 98.2°F | Wt 203.8 lb

## 2012-06-30 DIAGNOSIS — IMO0001 Reserved for inherently not codable concepts without codable children: Secondary | ICD-10-CM

## 2012-06-30 NOTE — Patient Instructions (Signed)
Try taking 60 mg at bedtime of cymbalta x 1 week  If still SE try 30 mg only at bedtime.. if still sleepy SE with this we will try next effexor (venlafaxine)

## 2012-06-30 NOTE — Progress Notes (Signed)
  Subjective:    Patient ID: Jillian [name], female    DOB: January 24, 1956, 57 y.o.   MRN: [id number]  HPI 57 year old female presents for discussion of medications tro treat fibromyalgia.   She has been on cymbalta since  3/10. She has not been tolerating cymbalta at 60 mg daily.  She has been able to cut back on tramadol significantly. Her issue is she falling asleep during the day. She is taking twice a day. lyrica (three times a day), tramadol (three times a day) and vitamin supplements.   Had weight gain with sertraline in past.. 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   Review of Systems  Constitutional: Negative for fever and fatigue.  HENT: Negative for ear pain.   Eyes: Negative for pain.  Respiratory: Negative for chest tightness and shortness of breath.   Cardiovascular: Negative for chest pain, palpitations and leg swelling.  Gastrointestinal: Negative for abdominal pain.  Genitourinary: Negative for dysuria.       Objective:   Physical Exam  Constitutional: Vital signs are normal. She appears well-developed and well-nourished. She is cooperative.  Non-toxic appearance. She does not appear ill. No distress.  HENT:  Head: Normocephalic.  Right Ear: Hearing, tympanic membrane, external ear and ear canal normal. Tympanic membrane is not erythematous, not retracted and not bulging.  Left Ear: Hearing, tympanic membrane, external ear and ear canal normal. Tympanic membrane is not erythematous, not retracted and not bulging.  Nose: No mucosal edema or rhinorrhea. Right sinus exhibits no maxillary sinus tenderness and no frontal sinus tenderness. Left sinus exhibits no maxillary sinus tenderness and no frontal sinus tenderness.  Mouth/Throat: Uvula is midline, oropharynx is clear and moist and mucous membranes are normal.  Eyes: Conjunctivae, EOM and lids are normal. Pupils are equal, round, and reactive to light. No foreign bodies found.  Neck: Trachea normal and normal range of motion. Neck  supple. Carotid bruit is not present. No mass and no thyromegaly present.  Cardiovascular: Normal rate, regular rhythm, S1 normal, S2 normal, normal heart sounds, intact distal pulses and normal pulses.  Exam reveals no gallop and no friction rub.   No murmur heard. Pulmonary/Chest: Effort normal and breath sounds normal. Not tachypneic. No respiratory distress. She has no decreased breath sounds. She has no wheezes. She has no rhonchi. She has no rales.  Abdominal: Soft. Normal appearance and bowel sounds are normal. There is no tenderness.  Neurological: She is alert.  Skin: Skin is warm, dry and intact. No rash noted.  Psychiatric: Her speech is normal and behavior is normal. Judgment and thought content normal. Her mood appears not anxious. Cognition and memory are normal. She does not exhibit a depressed mood.          Assessment & Plan:

## 2012-06-30 NOTE — Assessment & Plan Note (Signed)
She will try lower dose or bedtime dosing trial of cymbalta. If SE remain we will try effexor. She has concerns of weight gain with other meds like SSRI or TCAs.

## 2012-07-05 ENCOUNTER — Telehealth: Payer: Self-pay

## 2012-07-05 MED ORDER — VENLAFAXINE HCL ER 37.5 MG PO CP24: ORAL_CAPSULE | ORAL | Status: DC

## 2012-07-05 NOTE — Telephone Encounter (Signed)
Sent in Rx

## 2012-07-05 NOTE — Telephone Encounter (Signed)
Please call pt.. Verify that she has tried cymbalta at noght as well as the lower dose of cymbalta...still having SE I presume.  if so... We will change to venlafaxine.Marland Kitchen let me know what pharm and if above is true and I will send it in.

## 2012-07-05 NOTE — Telephone Encounter (Signed)
Pt left v/m to discuss med change for efferxor as discussed 06/30/12 visit; [facility] [address]. Left v/m for pt to call back.

## 2012-07-05 NOTE — Telephone Encounter (Signed)
Patient has already stopped taken the cymbalta. WOULD like effexor sent in

## 2012-08-15 ENCOUNTER — Telehealth: Payer: Self-pay

## 2012-08-15 NOTE — Telephone Encounter (Signed)
Pt left v/m' pt taking Effexor 37.5 mg taking 2 capsules daily for 6 weeks; pt feeling better (fibromyalgia pain is better but pt said taking 2 effexor has helped the feeling low sensation or depression) but pt thinks if increase effexor to 3 caps daily would be more effective dose.[facility] [address]pt request call back.

## 2012-08-15 NOTE — Telephone Encounter (Signed)
Okay to increase to 3 tabs daily.. change med list and refill as needed.

## 2012-08-15 NOTE — Telephone Encounter (Signed)
Patient advised of results.

## 2012-08-24 ENCOUNTER — Other Ambulatory Visit: Payer: Self-pay

## 2012-08-24 MED ORDER — VENLAFAXINE HCL ER 37.5 MG PO CP24: 112.5000 mg | ORAL_CAPSULE | Freq: Every day | ORAL | Status: DC

## 2012-08-24 NOTE — Telephone Encounter (Signed)
Pt request refill effexor xr to [facility] [address] Advise pt done. See 08/15/12 phone note.

## 2012-11-14 ENCOUNTER — Other Ambulatory Visit: Payer: Self-pay | Admitting: *Deleted

## 2012-11-14 MED ORDER — VENLAFAXINE HCL ER 37.5 MG PO CP24: 112.5000 mg | ORAL_CAPSULE | Freq: Every day | ORAL | Status: DC

## 2012-11-14 NOTE — Telephone Encounter (Signed)
Received faxed refill request from pharmacy. Refill sent to pharmacy electronically. 

## 2012-11-25 ENCOUNTER — Ambulatory Visit (INDEPENDENT_AMBULATORY_CARE_PROVIDER_SITE_OTHER): Payer: PRIVATE HEALTH INSURANCE | Admitting: Family Medicine

## 2012-11-25 ENCOUNTER — Encounter: Payer: Self-pay | Admitting: Family Medicine

## 2012-11-25 VITALS — BP 100/62 | HR 85 | Temp 98.6°F | Ht 68.0 in | Wt 205.0 lb

## 2012-11-25 DIAGNOSIS — IMO0001 Reserved for inherently not codable concepts without codable children: Secondary | ICD-10-CM

## 2012-11-25 DIAGNOSIS — F411 Generalized anxiety disorder: Secondary | ICD-10-CM | POA: Insufficient documentation

## 2012-11-25 MED ORDER — DULOXETINE HCL 20 MG PO CPEP: 20.0000 mg | ORAL_CAPSULE | Freq: Every day | ORAL | Status: DC

## 2012-11-25 NOTE — Patient Instructions (Addendum)
Add cymbalta to regimen at 20 mg daily. Continue other medications.  Keep an eye on signs of seratonin syndrome.. Heat, fever, jitteryness. Stop immediately if you have these.  Follow up in 1 month.

## 2012-11-25 NOTE — Assessment & Plan Note (Signed)
Will try to add low dose cymbalta given it was so effective for her ( had itching SE at higher dose)  She is aware of seratonin syndrome risks and will watch for these.  Follow up in 1 month.

## 2012-11-25 NOTE — Assessment & Plan Note (Signed)
Improved control on venlafaxine. 

## 2012-11-25 NOTE — Progress Notes (Signed)
57 year old female presents for discussion of medications to treat fibromyalgia.    She did not  tolerate cymbalta at 60 mg daily earlier this year. It helped with her symptoms but her issue was falling asleep during the day.   At last OV in 06/2012 we started venlafaxine and have tirated up now to 3 tabs daily.  She has not noted dramatic improvement with this... But her mood is  much better. She is also on lyrica (three times a day), tramadol (three times a day) and vitamin supplements.  Had weight gain with sertraline in past. Neurontin was ineffective. She is seeing Dr. Alric Quan her PA.Marland Kitchen  She has been trying to walk... She has added water exercise once a week.  She brings in calander with her pain levels... She has had fluctuations 1-3 up to 7-8.  She is getting desperate given this falre is longer than ever... Lasting almost the last year.  She is unable to function at work and home.   Review of Systems  Constitutional: Negative for fever and fatigue.  HENT: Negative for ear pain.  Eyes: Negative for pain.  Respiratory: Negative for chest tightness and shortness of breath.  Cardiovascular: Negative for chest pain, palpitations and leg swelling.  Gastrointestinal: Negative for abdominal pain.  Genitourinary: Negative for dysuria.  Objective:   Physical Exam  Constitutional: Vital signs are normal. She appears well-developed and well-nourished. She is cooperative. Non-toxic appearance. She does not appear ill. No distress.  HENT:  Head: Normocephalic.  Right Ear: Hearing, tympanic membrane, external ear and ear canal normal. Tympanic membrane is not erythematous, not retracted and not bulging.  Left Ear: Hearing, tympanic membrane, external ear and ear canal normal. Tympanic membrane is not erythematous, not retracted and not bulging.  Nose: No mucosal edema or rhinorrhea. Right sinus exhibits no maxillary sinus tenderness and no frontal sinus tenderness. Left sinus exhibits no  maxillary sinus tenderness and no frontal sinus tenderness.  Mouth/Throat: Uvula is midline, oropharynx is clear and moist and mucous membranes are normal.  Eyes: Conjunctivae, EOM and lids are normal. Pupils are equal, round, and reactive to light. No foreign bodies found.  Neck: Trachea normal and normal range of motion. Neck supple. Carotid bruit is not present. No mass and no thyromegaly present.  Cardiovascular: Normal rate, regular rhythm, S1 normal, S2 normal, normal heart sounds, intact distal pulses and normal pulses. Exam reveals no gallop and no friction rub.  No murmur heard.  Pulmonary/Chest: Effort normal and breath sounds normal. Not tachypneic. No respiratory distress. She has no decreased breath sounds. She has no wheezes. She has no rhonchi. She has no rales.  Abdominal: Soft. Normal appearance and bowel sounds are normal. There is no tenderness.  Neurological: She is alert.  Skin: Skin is warm, dry and intact. No rash noted.  Psychiatric: Her speech is normal and behavior is normal. Judgment and thought content normal. Her mood appears not anxious. Cognition and memory are normal. She does not exhibit a depressed mood.  Trigger points in upper, lower back, chest, lower arm, lower legs, knees, hands.. All B.

## 2012-11-29 ENCOUNTER — Telehealth: Payer: Self-pay

## 2012-12-27 ENCOUNTER — Ambulatory Visit: Payer: PRIVATE HEALTH INSURANCE | Admitting: Family Medicine

## 2013-01-03 ENCOUNTER — Encounter: Payer: Self-pay | Admitting: Family Medicine

## 2013-01-03 ENCOUNTER — Ambulatory Visit (INDEPENDENT_AMBULATORY_CARE_PROVIDER_SITE_OTHER): Payer: PRIVATE HEALTH INSURANCE | Admitting: Family Medicine

## 2013-01-03 VITALS — BP 106/60 | HR 88 | Temp 98.9°F | Ht 68.0 in | Wt 208.0 lb

## 2013-01-03 DIAGNOSIS — IMO0001 Reserved for inherently not codable concepts without codable children: Secondary | ICD-10-CM

## 2013-01-03 DIAGNOSIS — F411 Generalized anxiety disorder: Secondary | ICD-10-CM

## 2013-01-03 NOTE — Assessment & Plan Note (Signed)
Well controlled. Continue current medication.  

## 2013-01-03 NOTE — Progress Notes (Signed)
57 year old female presents for follow up of fibromyalgia.   She did not tolerate cymbalta at 60 mg daily earlier this year.  It helped with her symptoms but her issue was falling asleep during the day.  She is also on lyrica (three times a day), tramadol (three times a day) and vitamin supplements.   At last OV in 06/2012 we started venlafaxine and have tirated up now to 3 tabs daily. She has not noted dramatic improvement with this... But her mood is much better.   At last OV 11/2012:  8-9/10 ofpian scalae. Had weight gain with sertraline in past. Neurontin was ineffective.  She is seeing Dr. Alric Quan her PA.Marland Kitchen  She has been trying to walk... She has added water exercise once a week.  She brings in calander with her pain levels... She has had fluctuations 1-3 up to 7-8.  She is getting desperate given this falre is longer than ever... Lasting almost the last year.  She is unable to function at work and home.  Added back low dose of cymbalta to try to avoid SE at higher doses.  Today: She reports back on cymbalta 20 mg daily. She has had dramatic improvement in pain. She had some initial fatigue... But now tolerating f0-2 pon pain scale ter she gradually titrated up frequency of use to daily. Still good days and bad but overall more good day than bad!  She has been able to exercise.  Anxiety well controlled.  She would like me to follow fibromyalgia here instead of Dr. Thera Flake.      Review of Systems  Constitutional: Negative for fever and fatigue.  HENT: Negative for ear pain.  Eyes: Negative for pain.  Respiratory: Negative for chest tightness and shortness of breath.  Cardiovascular: Negative for chest pain, palpitations and leg swelling.  Gastrointestinal: Negative for abdominal pain.  Genitourinary: Negative for dysuria.  Objective:   Physical Exam  Constitutional: Vital signs are normal. She appears well-developed and well-nourished. She is cooperative. Non-toxic  appearance. She does not appear ill. No distress.  HENT:  Head: Normocephalic.  Right Ear: Hearing, tympanic membrane, external ear and ear canal normal. Tympanic membrane is not erythematous, not retracted and not bulging.  Left Ear: Hearing, tympanic membrane, external ear and ear canal normal. Tympanic membrane is not erythematous, not retracted and not bulging.  Nose: No mucosal edema or rhinorrhea. Right sinus exhibits no maxillary sinus tenderness and no frontal sinus tenderness. Left sinus exhibits no maxillary sinus tenderness and no frontal sinus tenderness.  Mouth/Throat: Uvula is midline, oropharynx is clear and moist and mucous membranes are normal.  Eyes: Conjunctivae, EOM and lids are normal. Pupils are equal, round, and reactive to light. No foreign bodies found.  Neck: Trachea normal and normal range of motion. Neck supple. Carotid bruit is not present. No mass and no thyromegaly present.  Cardiovascular: Normal rate, regular rhythm, S1 normal, S2 normal, normal heart sounds, intact distal pulses and normal pulses. Exam reveals no gallop and no friction rub.  No murmur heard.  Pulmonary/Chest: Effort normal and breath sounds normal. Not tachypneic. No respiratory distress. She has no decreased breath sounds. She has no wheezes. She has no rhonchi. She has no rales.  Abdominal: Soft. Normal appearance and bowel sounds are normal. There is no tenderness.  Neurological: She is alert.  Skin: Skin is warm, dry and intact. No rash noted.  Psychiatric: Her speech is normal and behavior is normal. Judgment and thought content normal. Her mood  appears not anxious. Cognition and memory are normal. She does not exhibit a depressed mood.  ONLY trigger points in chest, lower legs, knees,... . Much fewer than before.

## 2013-01-03 NOTE — Assessment & Plan Note (Signed)
Improvement with addition of cymbalta. Encouraged exercise, weight loss, healthy eating habits.

## 2013-01-03 NOTE — Patient Instructions (Signed)
Schedule CPX with fasting labs prior in 03/2013

## 2013-01-03 NOTE — Progress Notes (Signed)
  Subjective:    Patient ID: Jillian [name], female    DOB: [birth year], 57 y.o.   MRN: [id number]  HPI      Review of Systems     Objective:   Physical Exam        Assessment & Plan:

## 2013-01-20 ENCOUNTER — Other Ambulatory Visit: Payer: Self-pay

## 2013-01-20 MED ORDER — VENLAFAXINE HCL ER 37.5 MG PO CP24: 112.5000 mg | ORAL_CAPSULE | Freq: Every day | ORAL | Status: DC

## 2013-01-20 MED ORDER — TRAMADOL HCL 50 MG PO TABS: 100.0000 mg | ORAL_TABLET | Freq: Two times a day (BID) | ORAL | Status: DC

## 2013-01-20 NOTE — Telephone Encounter (Signed)
Tramadol called to [facility]. [facility] [place].

## 2013-01-20 NOTE — Telephone Encounter (Signed)
Jillian [name] notified Tramadol has been called to her pharmacy.

## 2013-01-20 NOTE — Telephone Encounter (Signed)
Pt left v/m had discussed with Dr Ermalene Searing about filling pts rheumatology meds.pt request refill tramadol to [facility]. Pt request cb when refilled.

## 2013-01-25 ENCOUNTER — Other Ambulatory Visit: Payer: Self-pay | Admitting: *Deleted

## 2013-01-25 NOTE — Telephone Encounter (Signed)
Last office visit 01/03/2013.  Ok to refill? 

## 2013-01-26 MED ORDER — DOXEPIN HCL 6 MG PO TABS: 6.0000 mg | ORAL_TABLET | Freq: Every evening | ORAL | Status: DC | PRN

## 2013-03-06 ENCOUNTER — Other Ambulatory Visit: Payer: Self-pay | Admitting: *Deleted

## 2013-03-06 NOTE — Telephone Encounter (Signed)
Received fax refill request for Methocarbamol 500 mg, take one tablet by mouth three times daily.  Not on current medication list. Last office visit 01/03/2013.  Ok to refill?

## 2013-03-07 MED ORDER — METHOCARBAMOL 500 MG PO TABS: 500.0000 mg | ORAL_TABLET | Freq: Three times a day (TID) | ORAL | Status: DC

## 2013-03-16 ENCOUNTER — Other Ambulatory Visit: Payer: Self-pay | Admitting: *Deleted

## 2013-03-16 MED ORDER — PREGABALIN 75 MG PO CAPS: ORAL_CAPSULE | ORAL | Status: DC

## 2013-03-16 NOTE — Telephone Encounter (Signed)
Last office visit 01/03/2013.  Ok to refill? 

## 2013-04-14 ENCOUNTER — Other Ambulatory Visit: Payer: Self-pay | Admitting: *Deleted

## 2013-04-14 MED ORDER — VENLAFAXINE HCL ER 37.5 MG PO CP24: 112.5000 mg | ORAL_CAPSULE | Freq: Every day | ORAL | Status: DC

## 2013-04-14 MED ORDER — DOXEPIN HCL 6 MG PO TABS: 6.0000 mg | ORAL_TABLET | Freq: Every evening | ORAL | Status: DC | PRN

## 2013-04-17 ENCOUNTER — Other Ambulatory Visit: Payer: Self-pay | Admitting: *Deleted

## 2013-04-17 MED ORDER — TRAMADOL HCL 50 MG PO TABS: 100.0000 mg | ORAL_TABLET | Freq: Two times a day (BID) | ORAL | Status: DC

## 2013-04-17 NOTE — Telephone Encounter (Signed)
Last office visit 01/03/2013.  Ok to refill?

## 2013-04-18 NOTE — Telephone Encounter (Signed)
Called to [facility].

## 2013-04-19 ENCOUNTER — Telehealth: Payer: Self-pay | Admitting: Family Medicine

## 2013-04-19 ENCOUNTER — Other Ambulatory Visit: Payer: Self-pay

## 2013-04-19 NOTE — Telephone Encounter (Signed)
Pt called in to make an apptmt for CPE and was surprised that your 1st available is not until 07/11/2013, which she scheduled.  She is concerned about getting her meds refilled.  Will she need to come in prior to 07/11/2013 in order to continue to get her refills? Thank you.

## 2013-04-19 NOTE — Telephone Encounter (Signed)
Pt called requesting status of cymbalta refill and requesting another revised refill of Tramadol quantity #180 to Jillian [name] said she previously discussed with Jillian [name] that Jillian [name] was prescribing Tramadol 50 mg taking 2 tabs three times a day and that is the way pt wants the prescription to read. Our med list has 2 tabs twice a day.( I did not change instructions or quantity on Tramadol until Jillian [name] decision.) Pt said she has not made her appt for CPX in Jan. Pt said today is just 04/19/13. Pt said she will scheduled CPX but wants cb about these medications.

## 2013-04-19 NOTE — Telephone Encounter (Signed)
Last filled 11/25/12 with 3 refills--please advise

## 2013-04-20 MED ORDER — TRAMADOL HCL 50 MG PO TABS: 100.0000 mg | ORAL_TABLET | Freq: Three times a day (TID) | ORAL | Status: DC

## 2013-04-20 MED ORDER — DULOXETINE HCL 20 MG PO CPEP: 20.0000 mg | ORAL_CAPSULE | Freq: Every day | ORAL | Status: DC

## 2013-04-20 NOTE — Telephone Encounter (Signed)
Left mssg for pt to return call.

## 2013-04-20 NOTE — Telephone Encounter (Signed)
Called to [place].  Left message for Ginnie that prescriptions have been sent to her pharmacy.

## 2013-04-20 NOTE — Telephone Encounter (Signed)
Rx changed as discussed and sent in

## 2013-04-20 NOTE — Telephone Encounter (Signed)
You can add her on at an earlier time for the cpx and cancel the 4/14 appt... Needs to be 30 min slot.

## 2013-04-24 NOTE — Telephone Encounter (Signed)
Pt scheduled for 05/25/2013

## 2013-05-01 ENCOUNTER — Other Ambulatory Visit: Payer: Self-pay | Admitting: Family Medicine

## 2013-05-01 NOTE — Telephone Encounter (Signed)
Called Lyrica to [facility]. [place].... Gasper Sells MA Student

## 2013-05-01 NOTE — Telephone Encounter (Signed)
Last office visit 01/03/2013.  CPX scheduled for 05/25/2013.  Last filled 03/06/2013.  Ok to refill?

## 2013-05-18 ENCOUNTER — Telehealth: Payer: Self-pay | Admitting: Family Medicine

## 2013-05-18 ENCOUNTER — Other Ambulatory Visit: Payer: PRIVATE HEALTH INSURANCE

## 2013-05-18 DIAGNOSIS — E781 Pure hyperglyceridemia: Secondary | ICD-10-CM

## 2013-05-18 NOTE — Telephone Encounter (Signed)
Message copied by Jinny Sanders on Thu May 18, 2013  1:07 AM ------      Message from: Ellamae Sia      Created: Fri May 12, 2013  4:36 PM      Regarding: Lab orders for Thursday, 2.19.15       Patient is scheduled for CPX labs, please order future labs, Thanks , Terri       ------

## 2013-05-22 ENCOUNTER — Other Ambulatory Visit: Payer: Self-pay | Admitting: Family Medicine

## 2013-05-22 NOTE — Telephone Encounter (Signed)
Last office visit 01-03-13. Ok to refill?

## 2013-05-23 ENCOUNTER — Other Ambulatory Visit (INDEPENDENT_AMBULATORY_CARE_PROVIDER_SITE_OTHER): Payer: PRIVATE HEALTH INSURANCE

## 2013-05-23 DIAGNOSIS — E781 Pure hyperglyceridemia: Secondary | ICD-10-CM

## 2013-05-23 LAB — COMPREHENSIVE METABOLIC PANEL
ALBUMIN: 4 g/dL (ref 3.5–5.2)
ALT: 34 U/L (ref 0–35)
AST: 25 U/L (ref 0–37)
Alkaline Phosphatase: 101 U/L (ref 39–117)
BUN: 16 mg/dL (ref 6–23)
CHLORIDE: 105 meq/L (ref 96–112)
CO2: 29 mEq/L (ref 19–32)
CREATININE: 0.8 mg/dL (ref 0.4–1.2)
Calcium: 9.3 mg/dL (ref 8.4–10.5)
GFR: 81.6 mL/min (ref >60.00)
GLUCOSE: 101 mg/dL — AB (ref 70–99)
POTASSIUM: 4.4 meq/L (ref 3.5–5.1)
Sodium: 140 mEq/L (ref 135–145)
Total Bilirubin: 0.6 mg/dL (ref 0.3–1.2)
Total Protein: 6.6 g/dL (ref 6.0–8.3)

## 2013-05-23 LAB — LIPID PANEL
CHOLESTEROL: 145 mg/dL (ref 0–200)
HDL: 29.4 mg/dL — AB (ref >39.00)
LDL Cholesterol: 76 mg/dL (ref 0–99)
Total CHOL/HDL Ratio: 5
Triglycerides: 196 mg/dL — ABNORMAL HIGH (ref 0.0–149.0)
VLDL: 39.2 mg/dL (ref 0.0–40.0)

## 2013-05-23 NOTE — Telephone Encounter (Signed)
Call to [facility]. [place].

## 2013-05-25 ENCOUNTER — Encounter: Payer: PRIVATE HEALTH INSURANCE | Admitting: Family Medicine

## 2013-06-13 ENCOUNTER — Ambulatory Visit (INDEPENDENT_AMBULATORY_CARE_PROVIDER_SITE_OTHER): Payer: PRIVATE HEALTH INSURANCE | Admitting: Family Medicine

## 2013-06-13 ENCOUNTER — Encounter: Payer: Self-pay | Admitting: Family Medicine

## 2013-06-13 VITALS — BP 104/60 | HR 84 | Temp 98.1°F | Ht 68.0 in | Wt 205.8 lb

## 2013-06-13 DIAGNOSIS — Z01419 Encounter for gynecological examination (general) (routine) without abnormal findings: Secondary | ICD-10-CM

## 2013-06-13 DIAGNOSIS — E781 Pure hyperglyceridemia: Secondary | ICD-10-CM

## 2013-06-13 DIAGNOSIS — Z0000 Encounter for general adult medical examination without abnormal findings: Secondary | ICD-10-CM

## 2013-06-13 DIAGNOSIS — IMO0001 Reserved for inherently not codable concepts without codable children: Secondary | ICD-10-CM

## 2013-06-13 MED ORDER — DULOXETINE HCL 20 MG PO CPEP: 40.0000 mg | ORAL_CAPSULE | Freq: Every day | ORAL | Status: DC

## 2013-06-13 NOTE — Assessment & Plan Note (Signed)
Improving control.  Encouraged exercise, weight loss, healthy eating habits.  

## 2013-06-13 NOTE — Progress Notes (Signed)
The patient is here for annual wellness exam and preventative care.   Fibromyalgia, well controlled on wellbutrin, lyrica (three times a day), tramadol (three times a day) and vitamin supplements. Also on wellbutrin (mood well controlled)  Seeing Dr. Estanislado Pandy for this issue.Jillian [name] Has next appt in next few months.  Working Biochemist, clinical.  Decreased flares over the winter, has started to worsen with spring months.  Hypertriglyceridemia: LDLat goal and trig improved. On fish oil. Lab Results  Component Value Date   CHOL 145 05/23/2013   HDL 29.40* 05/23/2013   LDLCALC 76 05/23/2013   LDLDIRECT 87.1 04/11/2012   TRIG 196.0* 05/23/2013   CHOLHDL 5 05/23/2013    She has started fitness challenge at work. Usually healthy eating habits. Wt Readings from Last 3 Encounters:  06/13/13 205 lb 12 oz (93.328 kg)  01/03/13 208 lb (94.348 kg)  11/25/12 205 lb (92.987 kg)     Review of Systems  Constitutional: Negative for fever, fatigue and unexpected weight change.  HENT: Negative for ear pain, congestion, sore throat, sneezing, trouble swallowing and sinus pressure.  Eyes: Negative for pain and itching.  Respiratory: Negative for cough, shortness of breath and wheezing.  Cardiovascular: Negative for chest pain, palpitations and leg swelling.  Gastrointestinal: Negative for nausea, abdominal pain, diarrhea,  Some increase in constipation in last few years and  No blood in stool.  Genitourinary: Negative for dysuria, hematuria, vaginal discharge, difficulty urinating and menstrual problem.  Skin: Negative for rash.  Neurological: Negative for syncope, weakness, light-headedness, numbness and headaches.  Psychiatric/Behavioral: Negative for confusion and dysphoric mood. The patient is not nervous/anxious.  Objective: Physical Exam  Constitutional: Vital signs are normal. She appears well-developed and well-nourished. She is cooperative. Non-toxic appearance. She does not appear ill. No distress.  HENT:   Head: Normocephalic.  Right Ear: Hearing, tympanic membrane, external ear and ear canal normal.  Left Ear: Hearing, tympanic membrane, external ear and ear canal normal.  Nose: Nose normal.  Eyes: Conjunctivae, EOM and lids are normal. Pupils are equal, round, and reactive to light. No foreign bodies found.  Neck: Trachea normal and normal range of motion. Neck supple. Carotid bruit is not present. No mass and no thyromegaly present.  Cardiovascular: Normal rate, regular rhythm, S1 normal, S2 normal, normal heart sounds and intact distal pulses. Exam reveals no gallop.  No murmur heard.  Pulmonary/Chest: Effort normal and breath sounds normal. No respiratory distress. She has no wheezes. She has no rhonchi. She has no rales.  Abdominal: Soft. Normal appearance and bowel sounds are normal. She exhibits no distension, no fluid wave, no abdominal bruit and no mass. There is no hepatosplenomegaly. There is no tenderness. There is no rebound, no guarding and no CVA tenderness. No hernia.  Genitourinary: Rectum normal, vagina normal and uterus normal. No breast swelling, tenderness, discharge or bleeding. Pelvic exam was performed with patient supine. There is no rash, tenderness or lesion on the right labia. There is no rash, tenderness or lesion on the left labia. Uterus is not enlarged and not tender. Right adnexum displays no mass, no tenderness and no fullness. Left adnexum displays no mass, no tenderness and no fullness.  Pap performed. Cervix normal, internal vaginal exam nml Lymphadenopathy:  She has no cervical adenopathy.  She has no axillary adenopathy.  Neurological: She is alert. She has normal strength. No cranial nerve deficit or sensory deficit.  Skin: Skin is warm, dry and intact. No rash noted.  Psychiatric: Her speech is normal and behavior is  normal. Judgment normal. Her mood appears not anxious. Cognition and memory are normal. She does not exhibit a depressed mood.   Assessment &  Plan: CPX: The patient's preventative maintenance and recommended screening tests for an annual wellness exam were reviewed in full today.  Brought up to date unless services declined.  Counselled on the importance of diet, exercise, and its role in overall health and mortality.  The patient's FH and SH was reviewed, including their home life, tobacco status, and drug and alcohol status.   Vaccines: Uptodate Td.  Mammo: plans every 2 years..no family history, no personal changes or fibrocystic disease. Last mammo 03/2012  DVE/pap:nml pap/dve nml 03/2012 after this every 3 years.  Colon: father with colon cancer age 27.. Colonoscopy 12/26/2010: no polyps, on q 5 year schedule.  Nonsmoker  Bone density: went through early menopause but no other risk factors. Begin screening at age 77.

## 2013-06-13 NOTE — Assessment & Plan Note (Signed)
Increasing flares, tolerating cymbalta better now taking it at bedtime.  Increase to 40 mg daily.

## 2013-06-13 NOTE — Patient Instructions (Addendum)
Increase cymbalta to 40 mg daily. Call if not improving as expected in 1 month.    Hypertriglyceridemia  Diet for High blood levels of Triglycerides Most fats in food are triglycerides. Triglycerides in your blood are stored as fat in your body. High levels of triglycerides in your blood may put you at a greater risk for heart disease and stroke.  Normal triglyceride levels are less than 150 mg/dL. Borderline high levels are 150-199 mg/dl. High levels are 200 - 499 mg/dL, and very high triglyceride levels are greater than 500 mg/dL. The decision to treat high triglycerides is generally based on the level. For people with borderline or high triglyceride levels, treatment includes weight loss and exercise. Drugs are recommended for people with very high triglyceride levels. Many people who need treatment for high triglyceride levels have metabolic syndrome. This syndrome is a collection of disorders that often include: insulin resistance, high blood pressure, blood clotting problems, high cholesterol and triglycerides. TESTING PROCEDURE FOR TRIGLYCERIDES  You should not eat 4 hours before getting your triglycerides measured. The normal range of triglycerides is between 10 and 250 milligrams per deciliter (mg/dl). Some people may have extreme levels (1000 or above), but your triglyceride level may be too high if it is above 150 mg/dl, depending on what other risk factors you have for heart disease.  People with high blood triglycerides may also have high blood cholesterol levels. If you have high blood cholesterol as well as high blood triglycerides, your risk for heart disease is probably greater than if you only had high triglycerides. High blood cholesterol is one of the main risk factors for heart disease. CHANGING YOUR DIET  Your weight can affect your blood triglyceride level. If you are more than 20% above your ideal body weight, you may be able to lower your blood triglycerides by losing weight.  Eating less and exercising regularly is the best way to combat this. Fat provides more calories than any other food. The best way to lose weight is to eat less fat. Only 30% of your total calories should come from fat. Less than 7% of your diet should come from saturated fat. A diet low in fat and saturated fat is the same as a diet to decrease blood cholesterol. By eating a diet lower in fat, you may lose weight, lower your blood cholesterol, and lower your blood triglyceride level.  Eating a diet low in fat, especially saturated fat, may also help you lower your blood triglyceride level. Ask your dietitian to help you figure how much fat you can eat based on the number of calories your caregiver has prescribed for you.  Exercise, in addition to helping with weight loss may also help lower triglyceride levels.   Alcohol can increase blood triglycerides. You may need to stop drinking alcoholic beverages.  Too much carbohydrate in your diet may also increase your blood triglycerides. Some complex carbohydrates are necessary in your diet. These may include bread, rice, potatoes, other starchy vegetables and cereals.  Reduce "simple" carbohydrates. These may include pure sugars, candy, honey, and jelly without losing other nutrients. If you have the kind of high blood triglycerides that is affected by the amount of carbohydrates in your diet, you will need to eat less sugar and less high-sugar foods. Your caregiver can help you with this.  Adding 2-4 grams of fish oil (EPA+ DHA) may also help lower triglycerides. Speak with your caregiver before adding any supplements to your regimen. Following the Diet  Maintain  your ideal weight. Your caregivers can help you with a diet. Generally, eating less food and getting more exercise will help you lose weight. Joining a weight control group may also help. Ask your caregivers for a good weight control group in your area.  Eat low-fat foods instead of high-fat  foods. This can help you lose weight too.  These foods are lower in fat. Eat MORE of these:   Dried beans, peas, and lentils.  Egg whites.  Low-fat cottage cheese.  Fish.  Lean cuts of meat, such as round, sirloin, rump, and flank (cut extra fat off meat you fix).  Whole grain breads, cereals and pasta.  Skim and nonfat dry milk.  Low-fat yogurt.  Poultry without the skin.  Cheese made with skim or part-skim milk, such as mozzarella, parmesan, farmers', ricotta, or pot cheese. These are higher fat foods. Eat LESS of these:   Whole milk and foods made from whole milk, such as American, blue, cheddar, monterey jack, and swiss cheese  High-fat meats, such as luncheon meats, sausages, knockwurst, bratwurst, hot dogs, ribs, corned beef, ground pork, and regular ground beef.  Fried foods. Limit saturated fats in your diet. Substituting unsaturated fat for saturated fat may decrease your blood triglyceride level. You will need to read package labels to know which products contain saturated fats.  These foods are high in saturated fat. Eat LESS of these:   Fried pork skins.  Whole milk.  Skin and fat from poultry.  Palm oil.  Butter.  Shortening.  Cream cheese.  Berniece Salines.  Margarines and baked goods made from listed oils.  Vegetable shortenings.  Chitterlings.  Fat from meats.  Coconut oil.  Palm kernel oil.  Lard.  Cream.  Sour cream.  Fatback.  Coffee whiteners and non-dairy creamers made with these oils.  Cheese made from whole milk. Use unsaturated fats (both polyunsaturated and monounsaturated) moderately. Remember, even though unsaturated fats are better than saturated fats; you still want a diet low in total fat.  These foods are high in unsaturated fat:   Canola oil.  Sunflower oil.  Mayonnaise.  Almonds.  Peanuts.  Pine nuts.  Margarines made with these oils.  Safflower oil.  Olive oil.  Avocados.  Cashews.  Peanut  butter.  Sunflower seeds.  Soybean oil.  Peanut oil.  Olives.  Pecans.  Walnuts.  Pumpkin seeds. Avoid sugar and other high-sugar foods. This will decrease carbohydrates without decreasing other nutrients. Sugar in your food goes rapidly to your blood. When there is excess sugar in your blood, your liver may use it to make more triglycerides. Sugar also contains calories without other important nutrients.  Eat LESS of these:   Sugar, brown sugar, powdered sugar, jam, jelly, preserves, honey, syrup, molasses, pies, candy, cakes, cookies, frosting, pastries, colas, soft drinks, punches, fruit drinks, and regular gelatin.  Avoid alcohol. Alcohol, even more than sugar, may increase blood triglycerides. In addition, alcohol is high in calories and low in nutrients. Ask for sparkling water, or a diet soft drink instead of an alcoholic beverage. Suggestions for planning and preparing meals   Bake, broil, grill or roast meats instead of frying.  Remove fat from meats and skin from poultry before cooking.  Add spices, herbs, lemon juice or vinegar to vegetables instead of salt, rich sauces or gravies.  Use a non-stick skillet without fat or use no-stick sprays.  Cool and refrigerate stews and broth. Then remove the hardened fat floating on the surface before serving.  Refrigerate  meat drippings and skim off fat to make low-fat gravies.  Serve more fish.  Use less butter, margarine and other high-fat spreads on bread or vegetables.  Use skim or reconstituted non-fat dry milk for cooking.  Cook with low-fat cheeses.  Substitute low-fat yogurt or cottage cheese for all or part of the sour cream in recipes for sauces, dips or congealed salads.  Use half yogurt/half mayonnaise in salad recipes.  Substitute evaporated skim milk for cream. Evaporated skim milk or reconstituted non-fat dry milk can be whipped and substituted for whipped cream in certain recipes.  Choose fresh fruits  for dessert instead of high-fat foods such as pies or cakes. Fruits are naturally low in fat. When Dining Out   Order low-fat appetizers such as fruit or vegetable juice, pasta with vegetables or tomato sauce.  Select clear, rather than cream soups.  Ask that dressings and gravies be served on the side. Then use less of them.  Order foods that are baked, broiled, poached, steamed, stir-fried, or roasted.  Ask for margarine instead of butter, and use only a small amount.  Drink sparkling water, unsweetened tea or coffee, or diet soft drinks instead of alcohol or other sweet beverages. QUESTIONS AND ANSWERS ABOUT OTHER FATS IN THE BLOOD: SATURATED FAT, TRANS FAT, AND CHOLESTEROL What is trans fat? Trans fat is a type of fat that is formed when vegetable oil is hardened through a process called hydrogenation. This process helps makes foods more solid, gives them shape, and prolongs their shelf life. Trans fats are also called hydrogenated or partially hydrogenated oils.  What do saturated fat, trans fat, and cholesterol in foods have to do with heart disease? Saturated fat, trans fat, and cholesterol in the diet all raise the level of LDL "bad" cholesterol in the blood. The higher the LDL cholesterol, the greater the risk for coronary heart disease (CHD). Saturated fat and trans fat raise LDL similarly.  What foods contain saturated fat, trans fat, and cholesterol? High amounts of saturated fat are found in animal products, such as fatty cuts of meat, chicken skin, and full-fat dairy products like butter, whole milk, cream, and cheese, and in tropical vegetable oils such as palm, palm kernel, and coconut oil. Trans fat is found in some of the same foods as saturated fat, such as vegetable shortening, some margarines (especially hard or stick margarine), crackers, cookies, baked goods, fried foods, salad dressings, and other processed foods made with partially hydrogenated vegetable oils. Small  amounts of trans fat also occur naturally in some animal products, such as milk products, beef, and lamb. Foods high in cholesterol include liver, other organ meats, egg yolks, shrimp, and full-fat dairy products. How can I use the new food label to make heart-healthy food choices? Check the Nutrition Facts panel of the food label. Choose foods lower in saturated fat, trans fat, and cholesterol. For saturated fat and cholesterol, you can also use the Percent Daily Value (%DV): 5% DV or less is low, and 20% DV or more is high. (There is no %DV for trans fat.) Use the Nutrition Facts panel to choose foods low in saturated fat and cholesterol, and if the trans fat is not listed, read the ingredients and limit products that list shortening or hydrogenated or partially hydrogenated vegetable oil, which tend to be high in trans fat. POINTS TO REMEMBER:   Discuss your risk for heart disease with your caregivers, and take steps to reduce risk factors.  Change your diet. Choose foods  that are low in saturated fat, trans fat, and cholesterol.  Add exercise to your daily routine if it is not already being done. Participate in physical activity of moderate intensity, like brisk walking, for at least 30 minutes on most, and preferably all days of the week. No time? Break the 30 minutes into three, 10-minute segments during the day.  Stop smoking. If you do smoke, contact your caregiver to discuss ways in which they can help you quit.  Do not use street drugs.  Maintain a normal weight.  Maintain a healthy blood pressure.  Keep up with your blood work for checking the fats in your blood as directed by your caregiver. Document Released: 01/02/2004 Document Revised: 09/15/2011 Document Reviewed: 07/30/2008 [facility] Patient Information 2014 [place].

## 2013-06-13 NOTE — Progress Notes (Signed)
Pre visit review using our clinic review tool, if applicable. No additional management support is needed unless otherwise documented below in the visit note. 

## 2013-06-15 ENCOUNTER — Other Ambulatory Visit: Payer: Self-pay | Admitting: Family Medicine

## 2013-06-15 NOTE — Telephone Encounter (Signed)
Electronic refill request, please advise  

## 2013-06-16 NOTE — Telephone Encounter (Signed)
Rx called in as prescribed 

## 2013-06-19 ENCOUNTER — Other Ambulatory Visit: Payer: Self-pay | Admitting: Family Medicine

## 2013-06-19 NOTE — Telephone Encounter (Signed)
Last office visit 06/13/2013.  Ok to refill? 

## 2013-06-20 NOTE — Telephone Encounter (Signed)
Called to [facility].

## 2013-07-04 ENCOUNTER — Other Ambulatory Visit: Payer: PRIVATE HEALTH INSURANCE

## 2013-07-11 ENCOUNTER — Encounter: Payer: PRIVATE HEALTH INSURANCE | Admitting: Family Medicine

## 2013-07-11 ENCOUNTER — Other Ambulatory Visit: Payer: Self-pay | Admitting: Family Medicine

## 2013-07-19 ENCOUNTER — Other Ambulatory Visit: Payer: Self-pay | Admitting: Family Medicine

## 2013-07-19 NOTE — Telephone Encounter (Signed)
Last office visit 06/13/2013.  Last refilled on 06/19/2013 for #180.  Ok to refill?

## 2013-07-20 NOTE — Telephone Encounter (Signed)
Called to [facility].

## 2013-08-01 ENCOUNTER — Telehealth: Payer: Self-pay

## 2013-08-01 MED ORDER — DULOXETINE HCL 60 MG PO CPEP: 60.0000 mg | ORAL_CAPSULE | Freq: Every day | ORAL | Status: DC

## 2013-08-01 NOTE — Telephone Encounter (Signed)
Can increase to 60 mg, can send in new rx if needed.

## 2013-08-01 NOTE — Telephone Encounter (Signed)
Jillian [name] notified to increase Cymbalta to 60 mg at bedtime.  New prescription sent to [facility].

## 2013-08-01 NOTE — Telephone Encounter (Signed)
Pt left v/m requesting increase of Cymbalta from 40 mg at hs; fibromyalgia has flared up.pt said has previously discussed with Dr Diona Browner.[facility] [address]Please advise.

## 2013-08-02 ENCOUNTER — Other Ambulatory Visit: Payer: Self-pay | Admitting: Family Medicine

## 2013-08-02 NOTE — Telephone Encounter (Signed)
Last office visit 06/13/2013.  Ok to refill? 

## 2013-08-02 NOTE — Telephone Encounter (Addendum)
Lyrica called to [place].

## 2013-08-17 ENCOUNTER — Other Ambulatory Visit: Payer: Self-pay | Admitting: Family Medicine

## 2013-08-17 NOTE — Telephone Encounter (Signed)
Last office visit 06/13/2013.  Last refilled 07/20/2013 for #180.  Ok to refill?

## 2013-08-17 NOTE — Telephone Encounter (Signed)
Called to [facility] [address] [place]. 

## 2013-08-31 ENCOUNTER — Telehealth: Payer: Self-pay

## 2013-08-31 MED ORDER — DULOXETINE HCL 30 MG PO CPEP
90.0000 mg | ORAL_CAPSULE | Freq: Every day | ORAL | Status: DC
Start: 2013-08-31 — End: 2013-12-08

## 2013-08-31 MED ORDER — DULOXETINE HCL 30 MG PO CPEP: ORAL_CAPSULE | ORAL | Status: DC

## 2013-08-31 NOTE — Telephone Encounter (Signed)
Let pt know no 90 mg tab... Sent in 30 mg for her to take with 60 mg tab.

## 2013-08-31 NOTE — Telephone Encounter (Signed)
Jillian [name] notified as instructed by telephone.  She would prefer to do (3) 30 mg tablets instead of having to pay for 2 prescriptions.  Resent Rx to [facility] for 30 mg to take 3 tablets daily.

## 2013-08-31 NOTE — Telephone Encounter (Signed)
Noted  

## 2013-08-31 NOTE — Telephone Encounter (Signed)
Pt left v/m; pt doing well on cymbalta 60 mg taking once daily; has helped fibromyalgia. Pt request increase of cymbalta to 90 mg to see if will help more with fibromyalgia. [place] request cb.

## 2013-09-15 ENCOUNTER — Other Ambulatory Visit: Payer: Self-pay | Admitting: Family Medicine

## 2013-09-15 NOTE — Telephone Encounter (Signed)
rx called into pharmacy

## 2013-10-16 ENCOUNTER — Other Ambulatory Visit: Payer: Self-pay | Admitting: Family Medicine

## 2013-10-16 NOTE — Telephone Encounter (Signed)
Tramadol called to [facility] [address] [place]. 

## 2013-10-16 NOTE — Telephone Encounter (Signed)
Last office visit 06/13/2013.  Ok to refill?

## 2013-11-09 ENCOUNTER — Other Ambulatory Visit: Payer: Self-pay | Admitting: Family Medicine

## 2013-11-15 ENCOUNTER — Other Ambulatory Visit: Payer: Self-pay | Admitting: Family Medicine

## 2013-11-15 NOTE — Telephone Encounter (Signed)
Electronic refill request, please advise  

## 2013-11-16 NOTE — Telephone Encounter (Signed)
Tramadol and Lyrica called to [facility] [address].

## 2013-12-08 ENCOUNTER — Other Ambulatory Visit: Payer: Self-pay | Admitting: Family Medicine

## 2013-12-15 ENCOUNTER — Other Ambulatory Visit: Payer: Self-pay | Admitting: Family Medicine

## 2013-12-15 NOTE — Telephone Encounter (Signed)
Called to [facility] [address] [place]. 

## 2013-12-15 NOTE — Telephone Encounter (Signed)
Last office visit 06/13/2013.  Last refilled 11/16/2013 for #180 with no refills.  Ok to refill?

## 2013-12-17 ENCOUNTER — Other Ambulatory Visit: Payer: Self-pay | Admitting: Family Medicine

## 2013-12-17 NOTE — Telephone Encounter (Signed)
Last office visit 06/13/2013.  Ok to refill?

## 2013-12-18 NOTE — Telephone Encounter (Signed)
Called to [facility] [address] [place]. 

## 2013-12-31 ENCOUNTER — Other Ambulatory Visit: Payer: Self-pay | Admitting: Family Medicine

## 2013-12-31 NOTE — Telephone Encounter (Signed)
Last office visit 06/13/2013.  Ok to refill?

## 2014-01-12 ENCOUNTER — Other Ambulatory Visit: Payer: Self-pay | Admitting: Family Medicine

## 2014-01-12 NOTE — Telephone Encounter (Signed)
Called to [facility] [address] [place]. 

## 2014-01-12 NOTE — Telephone Encounter (Signed)
Last office visit 06/13/2013.  Last refilled 12/15/2013 for #180.  Ok to refill?

## 2014-01-19 ENCOUNTER — Other Ambulatory Visit: Payer: Self-pay | Admitting: Family Medicine

## 2014-01-19 NOTE — Telephone Encounter (Signed)
Lyrica prescription faxed to [facility] [phone].

## 2014-01-19 NOTE — Telephone Encounter (Signed)
Last office visit 06/11/2013.  Last refilled 12/17/2013 for #120 with no refills. Ok to refill?

## 2014-02-12 ENCOUNTER — Other Ambulatory Visit: Payer: Self-pay | Admitting: Family Medicine

## 2014-02-12 NOTE — Telephone Encounter (Signed)
Last office visit 06/13/2013.  Last refilled 01/12/2014 for #180.  Ok to refill?

## 2014-02-13 NOTE — Telephone Encounter (Signed)
Called to [facility] [address] [place]. 

## 2014-03-01 ENCOUNTER — Other Ambulatory Visit: Payer: Self-pay | Admitting: Family Medicine

## 2014-03-02 NOTE — Telephone Encounter (Signed)
Last office visit 06/13/2013.  Last refilled 12/31/2013 for #90 with no refills.  Ok to refill?

## 2014-03-14 ENCOUNTER — Other Ambulatory Visit: Payer: Self-pay | Admitting: Family Medicine

## 2014-03-14 NOTE — Telephone Encounter (Signed)
Last office visit 06/13/2013.  Last refilled 02/12/2014 for #180.  Ok to refill?

## 2014-03-15 NOTE — Telephone Encounter (Signed)
Called to [facility] [address] [place]. 

## 2014-04-12 ENCOUNTER — Other Ambulatory Visit: Payer: Self-pay | Admitting: Family Medicine

## 2014-04-12 NOTE — Telephone Encounter (Signed)
Last office visit 06/13/2013.  Last refilled 03/15/2014 for #180 with no refills.  Ok to refill?

## 2014-04-12 NOTE — Telephone Encounter (Signed)
Called to [facility] [address] [place]. 

## 2014-04-26 ENCOUNTER — Other Ambulatory Visit: Payer: Self-pay | Admitting: Dermatology

## 2014-04-28 ENCOUNTER — Other Ambulatory Visit: Payer: Self-pay | Admitting: Family Medicine

## 2014-05-11 ENCOUNTER — Other Ambulatory Visit: Payer: Self-pay | Admitting: Family Medicine

## 2014-05-11 NOTE — Telephone Encounter (Signed)
Last office visit 06/03/2013.  Last refilled Robaxin 03/02/2014 for #90 with no refills.  Tramadol 04/12/2014 for #180 with no refills.  Ok to refill?

## 2014-05-11 NOTE — Telephone Encounter (Signed)
Tramadol called to [facility] [address].

## 2014-05-21 ENCOUNTER — Other Ambulatory Visit: Payer: Self-pay | Admitting: Family Medicine

## 2014-05-21 NOTE — Telephone Encounter (Signed)
Last office visit 06/13/2013.  Last refilled 01/19/2014 for #120 with 3 refills.  Ok to refill?

## 2014-05-22 NOTE — Telephone Encounter (Signed)
Called to [facility] [address] [place]. 

## 2014-06-11 ENCOUNTER — Other Ambulatory Visit: Payer: Self-pay | Admitting: Family Medicine

## 2014-06-11 NOTE — Telephone Encounter (Signed)
Last office visit 06/13/2013.  No future appointments scheduled.  Last refilled 05/11/2014 for #180 with no refills.  Ok to refill?

## 2014-06-11 NOTE — Telephone Encounter (Signed)
Called to [facility] [address] [place]. 

## 2014-06-13 ENCOUNTER — Telehealth: Payer: Self-pay

## 2014-06-13 MED ORDER — BUPROPION HCL ER (XL) 150 MG PO TB24: 150.0000 mg | ORAL_TABLET | Freq: Every day | ORAL | Status: DC

## 2014-06-13 NOTE — Telephone Encounter (Signed)
Okay to make the change. Stop cymbalta, START WELLBUTRIN XL 150 MG DAILY in AM Rx sent in.

## 2014-06-13 NOTE — Telephone Encounter (Signed)
Pt left v/m; pt has CPX scheduled 08/30/14; pt wants to know until pt is seen in 08/2014 if Dr Diona Browner would stop duloxetine and prescribe wellbutrin. Cymbalta helps with the pain but makes pt very sleepy; pt thinks wellbutrin may give pt boost and not cause sleepiness.pt request cb.[facility] [address]

## 2014-06-14 NOTE — Telephone Encounter (Signed)
Left message on confidential voicemail informing patient of medication change.

## 2014-06-15 ENCOUNTER — Other Ambulatory Visit: Payer: Self-pay | Admitting: *Deleted

## 2014-06-15 NOTE — Telephone Encounter (Signed)
Called patient. Notified her of Dr Rometta Emery comments. Patient verbalized understanding.

## 2014-06-15 NOTE — Addendum Note (Signed)
Addended by: Jacqualin Combes on: 06/15/2014 02:33 PM   Modules accepted: Medications

## 2014-06-15 NOTE — Telephone Encounter (Signed)
That is okay, please change med list to correspond.

## 2014-06-15 NOTE — Telephone Encounter (Signed)
Pt received message and called back; pt said the Cymbalta(duloxtine does make her sleepy but does not want to discontinue that med). When pt starts the wellbutrin pt wants to stop the venlafaxine. Is that OK? Pt request cb.

## 2014-06-15 NOTE — Telephone Encounter (Signed)
Med list has been updated 

## 2014-06-28 ENCOUNTER — Other Ambulatory Visit: Payer: Self-pay | Admitting: Family Medicine

## 2014-06-28 NOTE — Telephone Encounter (Signed)
Last office visit 06/13/2013.  CPE scheduled 08/30/2014.  Last refilled 05/21/2014 for #120.  Ok to refill?

## 2014-06-29 NOTE — Telephone Encounter (Signed)
Called to [facility] [address] [place]. 

## 2014-07-11 ENCOUNTER — Other Ambulatory Visit: Payer: Self-pay | Admitting: Family Medicine

## 2014-07-12 NOTE — Telephone Encounter (Signed)
Last office visit 06/13/2013.  CPE scheduled 08/30/2014.  Tramadol last refilled 06/11/2014 for #180 with no refills.  Robaxin last refilled 05/11/2014 for #90 with no refills. Ok to refill?

## 2014-07-13 NOTE — Telephone Encounter (Signed)
Tramadol called to [facility] [address].

## 2014-08-02 ENCOUNTER — Other Ambulatory Visit: Payer: Self-pay | Admitting: Family Medicine

## 2014-08-02 NOTE — Telephone Encounter (Signed)
Last office visit 06/13/2013.  CPE scheduled for 08/30/2014.  Last refilled 06/29/2014 for #120 with no refills.  Ok to refill?

## 2014-08-03 NOTE — Telephone Encounter (Signed)
Called to [facility] [address] [place]. 

## 2014-08-09 ENCOUNTER — Other Ambulatory Visit: Payer: Self-pay | Admitting: Family Medicine

## 2014-08-09 NOTE — Telephone Encounter (Signed)
Last office visit 06/13/2013. CPE scheduled 08/30/2014.  Last refilled 07/12/2014 for #180 with no refills.  Ok to refill?

## 2014-08-10 NOTE — Telephone Encounter (Signed)
Called to [facility] [address] [place]. 

## 2014-08-30 ENCOUNTER — Ambulatory Visit (INDEPENDENT_AMBULATORY_CARE_PROVIDER_SITE_OTHER): Payer: PRIVATE HEALTH INSURANCE | Admitting: Family Medicine

## 2014-08-30 ENCOUNTER — Encounter: Payer: Self-pay | Admitting: Family Medicine

## 2014-08-30 VITALS — BP 98/62 | HR 76 | Temp 98.7°F | Ht 67.5 in | Wt 188.8 lb

## 2014-08-30 DIAGNOSIS — Z23 Encounter for immunization: Secondary | ICD-10-CM | POA: Diagnosis not present

## 2014-08-30 DIAGNOSIS — E781 Pure hyperglyceridemia: Secondary | ICD-10-CM

## 2014-08-30 DIAGNOSIS — Z0000 Encounter for general adult medical examination without abnormal findings: Secondary | ICD-10-CM | POA: Diagnosis not present

## 2014-08-30 DIAGNOSIS — M797 Fibromyalgia: Secondary | ICD-10-CM

## 2014-08-30 LAB — COMPREHENSIVE METABOLIC PANEL
ALBUMIN: 4.4 g/dL (ref 3.5–5.2)
ALK PHOS: 93 U/L (ref 39–117)
ALT: 32 U/L (ref 0–35)
AST: 25 U/L (ref 0–37)
BUN: 13 mg/dL (ref 6–23)
CHLORIDE: 103 meq/L (ref 96–112)
CO2: 31 meq/L (ref 19–32)
CREATININE: 0.86 mg/dL (ref 0.40–1.20)
Calcium: 9.4 mg/dL (ref 8.4–10.5)
GFR: 71.52 mL/min (ref >60.00)
Glucose, Bld: 85 mg/dL (ref 70–99)
Potassium: 4.2 mEq/L (ref 3.5–5.1)
Sodium: 137 mEq/L (ref 135–145)
TOTAL PROTEIN: 7 g/dL (ref 6.0–8.3)
Total Bilirubin: 0.4 mg/dL (ref 0.2–1.2)

## 2014-08-30 LAB — LIPID PANEL
Cholesterol: 151 mg/dL (ref 0–200)
HDL: 29 mg/dL — AB (ref >39.00)
LDL CALC: 91 mg/dL (ref 0–99)
NonHDL: 122
Total CHOL/HDL Ratio: 5
Triglycerides: 155 mg/dL — ABNORMAL HIGH (ref 0.0–149.0)
VLDL: 31 mg/dL (ref 0.0–40.0)

## 2014-08-30 NOTE — Assessment & Plan Note (Signed)
Stable on cymbalta and lyrica

## 2014-08-30 NOTE — Progress Notes (Signed)
Pre visit review using our clinic review tool, if applicable. No additional management support is needed unless otherwise documented below in the visit note. 

## 2014-08-30 NOTE — Patient Instructions (Addendum)
Stop at lab on way out. Schedule mammogram on your own every 2 years.

## 2014-08-30 NOTE — Progress Notes (Signed)
The patient is here for annual wellness exam and preventative   Fibromyalgia, well controlled on cymbalta,, lyrica (three times a day), tramadol (three times a day) and vitamin supplements. Also on wellbutrin (mood well controlled)  Working fulltime.  Decreased flares over the winter, has started to worsen with spring months   Elevated Cholesterol: Due for re-eval. Lab Results  Component Value Date   CHOL 145 05/23/2013   HDL 29.40* 05/23/2013   LDLCALC 76 05/23/2013   LDLDIRECT 87.1 04/11/2012   TRIG 196.0* 05/23/2013   CHOLHDL 5 05/23/2013  Using medications without problems: Muscle aches:  Diet compliance: healthy Exercise: Has fit bit, walking  3-4 days a week. Other complaints:  Wt Readings from Last 3 Encounters:  08/30/14 188 lb 12 oz (85.616 kg)  06/13/13 205 lb 12 oz (93.328 kg)  01/03/13 208 lb (94.348 kg)     Review of Systems  Constitutional: Negative for fever, fatigue and unexpected weight change.  HENT: Negative for ear pain, congestion, sore throat, sneezing, trouble swallowing and sinus pressure.  Eyes: Negative for pain and itching.  Respiratory: Negative for cough, shortness of breath and wheezing.  Cardiovascular: Negative for chest pain, palpitations and leg swelling.  Gastrointestinal: Negative for nausea, abdominal pain, diarrhea, no constipation anymore with flax seeds. No blood in stool.  Genitourinary: Negative for dysuria, hematuria, vaginal discharge, difficulty urinating and menstrual problem.  Skin: Negative for rash.  Neurological: Negative for syncope, weakness, light-headedness, numbness and headaches.  Psychiatric/Behavioral: Negative for confusion and dysphoric mood. The patient is not nervous/anxious.  Objective: Physical Exam  Constitutional: Vital signs are normal. She appears well-developed and well-nourished. She is cooperative. Non-toxic appearance. She does not appear ill. No distress.  HENT:  Head: Normocephalic.   Right Ear: Hearing, tympanic membrane, external ear and ear canal normal.  Left Ear: Hearing, tympanic membrane, external ear and ear canal normal.  Nose: Nose normal.  Eyes: Conjunctivae, EOM and lids are normal. Pupils are equal, round, and reactive to light. No foreign bodies found.  Neck: Trachea normal and normal range of motion. Neck supple. Carotid bruit is not present. No mass and no thyromegaly present.  Cardiovascular: Normal rate, regular rhythm, S1 normal, S2 normal, normal heart sounds and intact distal pulses. Exam reveals no gallop.  No murmur heard.  Pulmonary/Chest: Effort normal and breath sounds normal. No respiratory distress. She has no wheezes. She has no rhonchi. She has no rales.  Abdominal: Soft. Normal appearance and bowel sounds are normal. She exhibits no distension, no fluid wave, no abdominal bruit and no mass. There is no hepatosplenomegaly. There is no tenderness. There is no rebound, no guarding and no CVA tenderness. No hernia.  Genitourinary: Rectum normal, vagina normal and uterus normal. No breast swelling, tenderness, discharge or bleeding. Pelvic exam was performed with patient supine. There is no rash, tenderness or lesion on the right labia. There is no rash, tenderness or lesion on the left labia. Uterus is not enlarged and not tender. Right adnexum displays no mass, no tenderness and no fullness. Left adnexum displays no mass, no tenderness and no fullness.  Pap NOT performed. Cervix normal, internal vaginal exam nml Lymphadenopathy:  She has no cervical adenopathy.  She has no axillary adenopathy.  Neurological: She is alert. She has normal strength. No cranial nerve deficit or sensory deficit.  Skin: Skin is warm, dry and intact. No rash noted.  Psychiatric: Her speech is normal and behavior is normal. Judgment normal. Her mood appears not anxious. Cognition and  memory are normal. She does not exhibit a depressed mood.   Assessment & Plan: CPX:  The patient's preventative maintenance and recommended screening tests for an annual wellness exam were reviewed in full today.  Brought up to date unless services declined.  Counselled on the importance of diet, exercise, and its role in overall health and mortality.  The patient's FH and SH was reviewed, including their home life, tobacco status, and drug and alcohol status.   Vaccines: Uptodate Td.  Mammo: plans every 2 years..no family history, no personal changes or fibrocystic disease. Last mammo 03/2012  DVE/pap:nml pap/dve nml 03/2012 after this every 3 years.  DVE yearly. Colon: father with colon cancer age 33.. Colonoscopy 12/26/2010: no polyps, on q 5 year schedule.  Nonsmoker  Bone density: went through early menopause but no other risk factors. Begin screening at age 25.

## 2014-08-30 NOTE — Addendum Note (Signed)
Addended by: Carter Kitten on: 08/30/2014 12:34 PM   Modules accepted: Orders

## 2014-08-30 NOTE — Assessment & Plan Note (Signed)
Due for re-eval. 

## 2014-08-31 ENCOUNTER — Other Ambulatory Visit: Payer: Self-pay | Admitting: Family Medicine

## 2014-08-31 ENCOUNTER — Encounter: Payer: Self-pay | Admitting: *Deleted

## 2014-08-31 NOTE — Telephone Encounter (Signed)
Called to [facility] [address] [place]. 

## 2014-08-31 NOTE — Telephone Encounter (Signed)
Last office visit 08/30/2014.  Last refilled 08/02/2014 for #120 with no refill.  Ok to refill?

## 2014-09-04 ENCOUNTER — Other Ambulatory Visit: Payer: Self-pay | Admitting: Family Medicine

## 2014-09-04 DIAGNOSIS — Z1231 Encounter for screening mammogram for malignant neoplasm of breast: Secondary | ICD-10-CM

## 2014-09-07 ENCOUNTER — Ambulatory Visit
Admission: RE | Admit: 2014-09-07 | Discharge: 2014-09-07 | Disposition: A | Discharge status: Home / Self Care | Payer: PRIVATE HEALTH INSURANCE | Source: Ambulatory Visit | Attending: Family Medicine | Admitting: Family Medicine

## 2014-09-07 DIAGNOSIS — Z1231 Encounter for screening mammogram for malignant neoplasm of breast: Secondary | ICD-10-CM | POA: Insufficient documentation

## 2014-09-07 HISTORY — DX: Malignant (primary) neoplasm, unspecified: C80.1

## 2014-09-10 ENCOUNTER — Other Ambulatory Visit: Payer: Self-pay | Admitting: Family Medicine

## 2014-09-10 NOTE — Telephone Encounter (Signed)
Last office visit 08/30/2014.  Last refilled 08/09/2014 for #180.  Ok to refill?

## 2014-09-10 NOTE — Telephone Encounter (Signed)
Called to [facility] [address] [place]. 

## 2014-09-28 ENCOUNTER — Other Ambulatory Visit: Payer: Self-pay | Admitting: Family Medicine

## 2014-09-28 NOTE — Telephone Encounter (Signed)
Called into [facility] [address] [place] 

## 2014-09-28 NOTE — Telephone Encounter (Signed)
Last office visit 08/30/2014.  Last refilled 08/31/2014 for #120 with no refills.  Ok to refill?

## 2014-10-09 ENCOUNTER — Other Ambulatory Visit: Payer: Self-pay | Admitting: Family Medicine

## 2014-10-09 NOTE — Telephone Encounter (Signed)
Last office visit 08/30/2014.  Last refilled 09/10/2014 for #180 with no refills. Ok to refill?

## 2014-10-09 NOTE — Telephone Encounter (Signed)
Tramadol called in to [facility] [place].

## 2014-10-14 ENCOUNTER — Other Ambulatory Visit: Payer: Self-pay | Admitting: Family Medicine

## 2014-10-15 NOTE — Telephone Encounter (Signed)
Last office visit 08/30/2014.  Ok to refill?

## 2014-10-28 ENCOUNTER — Other Ambulatory Visit: Payer: Self-pay | Admitting: Family Medicine

## 2014-10-28 NOTE — Telephone Encounter (Signed)
Last office visit 08/30/2014.  Last refilled 09/28/2014 for #120 with no refills. Ok to refill?

## 2014-10-29 NOTE — Telephone Encounter (Signed)
Called into [facility] [address]  

## 2014-11-06 ENCOUNTER — Other Ambulatory Visit: Payer: Self-pay | Admitting: Family Medicine

## 2014-11-06 NOTE — Telephone Encounter (Signed)
Rx called in as directed.   

## 2014-11-06 NOTE — Telephone Encounter (Signed)
Ok to refill 

## 2014-11-12 ENCOUNTER — Telehealth: Payer: Self-pay

## 2014-11-12 NOTE — Telephone Encounter (Signed)
Pt left v/m; pt request increase in cymbalta for fibromyalgia being worse to [facility]. Pain has been worse the last 2 weeks. Pt last annual exam 08/30/14.pt request cb.

## 2014-11-13 MED ORDER — DULOXETINE HCL 30 MG PO CPEP: 90.0000 mg | ORAL_CAPSULE | Freq: Every day | ORAL | Status: DC

## 2014-11-13 NOTE — Telephone Encounter (Signed)
Patient returned Donna's call.  Please call patient back at [phone].

## 2014-11-13 NOTE — Telephone Encounter (Signed)
Left message for Ms. Lamorte to return my call. 

## 2014-11-13 NOTE — Telephone Encounter (Signed)
Okay to increase medication to 90 mg. She will need to take a 30 mg and her current 60 mg to increase or we can send in 3 X 30 mg tablets.. Which ever pt would like.  Please refill as needed. Have pt make follow up on fibromyalgia in 1 month to re-eval on higher dose.

## 2014-11-13 NOTE — Telephone Encounter (Signed)
Jillian [name] notified as instructed by telephone.  Patient prefers to take 3 capsules daily.  New prescription for Cymbalta 30 mg sent to [facility] as requested.  Patient will call back and schedule one month follow up with Dr. Diona Browner.

## 2014-12-06 ENCOUNTER — Other Ambulatory Visit: Payer: Self-pay | Admitting: Family Medicine

## 2014-12-06 NOTE — Telephone Encounter (Signed)
Called into [facility] [address] [place] 

## 2014-12-06 NOTE — Telephone Encounter (Signed)
Last office visit 08/30/2014.  Last refilled Lyrica 10/29/2014 for #120 with no refills.  Tramadol 11/06/2014 #180 with no refills.  Ok to refill?

## 2014-12-28 ENCOUNTER — Encounter: Payer: Self-pay | Admitting: Family Medicine

## 2014-12-28 ENCOUNTER — Ambulatory Visit (INDEPENDENT_AMBULATORY_CARE_PROVIDER_SITE_OTHER): Payer: PRIVATE HEALTH INSURANCE | Admitting: Family Medicine

## 2014-12-28 VITALS — BP 128/83 | HR 85 | Temp 98.6°F | Ht 67.5 in | Wt 192.8 lb

## 2014-12-28 DIAGNOSIS — M797 Fibromyalgia: Secondary | ICD-10-CM

## 2014-12-28 NOTE — Patient Instructions (Signed)
Continue current meds.  Exercise as you are able!

## 2014-12-28 NOTE — Assessment & Plan Note (Signed)
Tolerable control on current regimen. Continue.  Exercise as able.

## 2014-12-28 NOTE — Progress Notes (Signed)
Pre visit review using our clinic review tool, if applicable. No additional management support is needed unless otherwise documented below in the visit note. 

## 2014-12-28 NOTE — Progress Notes (Signed)
   Subjective:    Patient ID: Jillian [name], female    DOB: November 13, 1955, 59 y.o.   MRN: [id number]  HPI   59 year old female presents for  1 month follow up on  Fibromyalgia.   She is treated with cymbalta 60 mg, lyrica (three times a day), tramadol (three times a day) and vitamin supplements. Also on wellbutrin (mood well controlled)  Working fulltime.  Decreased flares over the winter.  ON a phone note on 8/16... She reported increased symptoms and was told to increase cymbalta to 90 mg daily   Today she reports she continues to have good and bad days.  She she has had more managable symptoms on higher dose cymbalta. Decreased energy. NO SE on higher dose. She expects improved symptoms with cold weather coming in. Using tramadol three times a day.  Not able to exercise but plans to restart in cool weather.      Review of Systems  Constitutional: Negative for fever and fatigue.  HENT: Negative for ear pain.   Eyes: Negative for pain.  Respiratory: Negative for chest tightness and shortness of breath.   Cardiovascular: Negative for chest pain, palpitations and leg swelling.  Gastrointestinal: Negative for abdominal pain.  Genitourinary: Negative for dysuria.       Objective:   Physical Exam  Constitutional: Vital signs are normal. She appears well-developed and well-nourished. She is cooperative.  Non-toxic appearance. She does not appear ill. No distress.  HENT:  Head: Normocephalic.  Right Ear: Hearing, tympanic membrane, external ear and ear canal normal. Tympanic membrane is not erythematous, not retracted and not bulging.  Left Ear: Hearing, tympanic membrane, external ear and ear canal normal. Tympanic membrane is not erythematous, not retracted and not bulging.  Nose: No mucosal edema or rhinorrhea. Right sinus exhibits no maxillary sinus tenderness and no frontal sinus tenderness. Left sinus exhibits no maxillary sinus tenderness and no frontal sinus tenderness.    Mouth/Throat: Uvula is midline, oropharynx is clear and moist and mucous membranes are normal.  Eyes: Conjunctivae, EOM and lids are normal. Pupils are equal, round, and reactive to light. Lids are everted and swept, no foreign bodies found.  Neck: Trachea normal and normal range of motion. Neck supple. Carotid bruit is not present. No thyroid mass and no thyromegaly present.  Cardiovascular: Normal rate, regular rhythm, S1 normal, S2 normal, normal heart sounds, intact distal pulses and normal pulses.  Exam reveals no gallop and no friction rub.   No murmur heard. Pulmonary/Chest: Effort normal and breath sounds normal. No tachypnea. No respiratory distress. She has no decreased breath sounds. She has no wheezes. She has no rhonchi. She has no rales.  Abdominal: Soft. Normal appearance and bowel sounds are normal. There is no tenderness.  Neurological: She is alert.  Skin: Skin is warm, dry and intact. No rash noted.  Psychiatric: Her speech is normal and behavior is normal. Judgment and thought content normal. Her mood appears not anxious. Cognition and memory are normal. She does not exhibit a depressed mood.   12/18 trigger points today       Assessment & Plan:

## 2015-02-01 ENCOUNTER — Other Ambulatory Visit: Payer: Self-pay | Admitting: Family Medicine

## 2015-02-01 NOTE — Telephone Encounter (Signed)
Last office visit 12/28/2014.  Both last refilled on 12/06/2014.  Ok to refill?

## 2015-02-01 NOTE — Telephone Encounter (Signed)
Lyrica and Tramadol called into [facility] [address]

## 2015-03-01 ENCOUNTER — Other Ambulatory Visit: Payer: Self-pay | Admitting: Family Medicine

## 2015-03-01 NOTE — Telephone Encounter (Signed)
Refills called into [facility] [address].

## 2015-03-01 NOTE — Telephone Encounter (Signed)
Last office visit 12/28/2014.  Last refilled 02/01/2015.  Ok to refill?

## 2015-04-02 ENCOUNTER — Other Ambulatory Visit: Payer: Self-pay | Admitting: Family Medicine

## 2015-04-03 NOTE — Telephone Encounter (Signed)
Pt left v/m requesting status of refill request wellbutrin,tramadol and lyrica. Pt request cb.

## 2015-04-03 NOTE — Telephone Encounter (Signed)
Last office visit 12/28/2014.  Last refilled 03/01/2015.  Ok to refill?

## 2015-04-03 NOTE — Telephone Encounter (Signed)
Pt left v/m; pt request cb about refill of tramadol and lyrica; pt has been on med for 10 years and has taken last doses today. Pt will be out of med on 04/04/15.

## 2015-04-04 NOTE — Telephone Encounter (Signed)
Lyrica and Tramadol called into [facility] [address].  Jillian [name] notified refills have been called into her pharmacy.  Also advised to call for refills at least 3 days in advance before she is going to run out.

## 2015-04-04 NOTE — Telephone Encounter (Signed)
Remind patient to request refills 1 week before running out.   OK.  Lyrica #120, 0 ref  Tramadol, #180, 0 ref

## 2015-05-01 ENCOUNTER — Other Ambulatory Visit: Payer: Self-pay | Admitting: Family Medicine

## 2015-05-01 NOTE — Telephone Encounter (Signed)
Last office visit 12/28/2014.  Last refilled 04/04/2015.  Ok to refill?

## 2015-05-02 NOTE — Telephone Encounter (Signed)
Lyrica and Tramadol called into [facility] [place].

## 2015-05-30 ENCOUNTER — Other Ambulatory Visit: Payer: Self-pay | Admitting: Family Medicine

## 2015-05-31 NOTE — Telephone Encounter (Signed)
Pres[facility] [phone]. 

## 2015-06-26 ENCOUNTER — Other Ambulatory Visit: Payer: Self-pay | Admitting: Family Medicine

## 2015-06-26 NOTE — Telephone Encounter (Signed)
Last office visit 12/28/2014.  Last refilled 05/30/2015.  Ok to refill?

## 2015-06-27 NOTE — Telephone Encounter (Signed)
Lyrica and Tramadol called into [facility] [address].

## 2015-07-24 ENCOUNTER — Other Ambulatory Visit: Payer: Self-pay | Admitting: Family Medicine

## 2015-07-24 NOTE — Telephone Encounter (Signed)
Last office visit 12/28/2014.  Last refilled 06/27/2015.  Ok to refill?

## 2015-07-25 NOTE — Telephone Encounter (Signed)
Lyrica and Tramadol called into [facility] [address].

## 2015-08-22 ENCOUNTER — Other Ambulatory Visit: Payer: Self-pay | Admitting: Family Medicine

## 2015-08-22 NOTE — Telephone Encounter (Signed)
Lyrica and Tramadol called into [facility] [address].

## 2015-08-22 NOTE — Telephone Encounter (Signed)
Last office visit 12/11/2014.  Last refilled 07/25/2015.  Ok to refill?

## 2015-09-19 ENCOUNTER — Other Ambulatory Visit: Payer: Self-pay | Admitting: Family Medicine

## 2015-09-19 NOTE — Telephone Encounter (Signed)
Last office visit 12/28/2014.  Last refilled 08/22/2015.  Ok to refill?

## 2015-09-19 NOTE — Telephone Encounter (Signed)
Tramadol and Lyrica called into [facility] [address].

## 2015-10-02 ENCOUNTER — Other Ambulatory Visit: Payer: Self-pay | Admitting: Family Medicine

## 2015-10-02 NOTE — Telephone Encounter (Signed)
Last office visit 12/28/2014.  Last refilled 06/27/2015 for #90 with no refills.  Ok to refill?

## 2015-10-18 ENCOUNTER — Other Ambulatory Visit (INDEPENDENT_AMBULATORY_CARE_PROVIDER_SITE_OTHER): Payer: PRIVATE HEALTH INSURANCE

## 2015-10-18 DIAGNOSIS — R7989 Other specified abnormal findings of blood chemistry: Secondary | ICD-10-CM | POA: Diagnosis not present

## 2015-10-18 DIAGNOSIS — Z0000 Encounter for general adult medical examination without abnormal findings: Secondary | ICD-10-CM

## 2015-10-18 DIAGNOSIS — Z1159 Encounter for screening for other viral diseases: Secondary | ICD-10-CM

## 2015-10-18 DIAGNOSIS — E559 Vitamin D deficiency, unspecified: Secondary | ICD-10-CM | POA: Diagnosis not present

## 2015-10-18 LAB — CBC WITH DIFFERENTIAL/PLATELET
BASOS ABS: 0 10*3/uL (ref 0.0–0.1)
Basophils Relative: 0.3 % (ref 0.0–3.0)
EOS ABS: 0 10*3/uL (ref 0.0–0.7)
Eosinophils Relative: 0 % (ref 0.0–5.0)
HEMATOCRIT: 39.4 % (ref 36.0–46.0)
Hemoglobin: 13.3 g/dL (ref 12.0–15.0)
LYMPHS PCT: 44.2 % (ref 12.0–46.0)
Lymphs Abs: 2.1 10*3/uL (ref 0.7–4.0)
MCHC: 33.8 g/dL (ref 30.0–36.0)
MCV: 89.4 fl (ref 78.0–100.0)
MONOS PCT: 9 % (ref 3.0–12.0)
Monocytes Absolute: 0.4 10*3/uL (ref 0.1–1.0)
Neutro Abs: 2.2 10*3/uL (ref 1.4–7.7)
Neutrophils Relative %: 46.5 % (ref 43.0–77.0)
PLATELETS: 210 10*3/uL (ref 150.0–400.0)
RBC: 4.41 Mil/uL (ref 3.87–5.11)
RDW: 12.7 % (ref 11.5–15.5)
WBC: 4.8 10*3/uL (ref 4.0–10.5)

## 2015-10-18 LAB — COMPREHENSIVE METABOLIC PANEL
ALK PHOS: 92 U/L (ref 39–117)
ALT: 26 U/L (ref 0–35)
AST: 23 U/L (ref 0–37)
Albumin: 4.4 g/dL (ref 3.5–5.2)
BUN: 17 mg/dL (ref 6–23)
CO2: 29 meq/L (ref 19–32)
Calcium: 9.7 mg/dL (ref 8.4–10.5)
Chloride: 104 mEq/L (ref 96–112)
Creatinine, Ser: 1.04 mg/dL (ref 0.40–1.20)
GFR: 57.22 mL/min — AB (ref >60.00)
GLUCOSE: 117 mg/dL — AB (ref 70–99)
POTASSIUM: 4.4 meq/L (ref 3.5–5.1)
Sodium: 138 mEq/L (ref 135–145)
Total Bilirubin: 0.4 mg/dL (ref 0.2–1.2)
Total Protein: 7.1 g/dL (ref 6.0–8.3)

## 2015-10-18 LAB — LIPID PANEL
CHOL/HDL RATIO: 5
Cholesterol: 172 mg/dL (ref 0–200)
HDL: 33.4 mg/dL — AB (ref >39.00)
NONHDL: 138.14
TRIGLYCERIDES: 209 mg/dL — AB (ref 0.0–149.0)
VLDL: 41.8 mg/dL — ABNORMAL HIGH (ref 0.0–40.0)

## 2015-10-18 LAB — TSH: TSH: 1.79 u[IU]/mL (ref 0.35–4.50)

## 2015-10-18 LAB — LDL CHOLESTEROL, DIRECT: Direct LDL: 97 mg/dL

## 2015-10-18 LAB — VITAMIN D 25 HYDROXY (VIT D DEFICIENCY, FRACTURES): VITD: 21.88 ng/mL — AB (ref 30.00–100.00)

## 2015-10-19 LAB — HEPATITIS C ANTIBODY: HCV Ab: NEGATIVE

## 2015-10-21 ENCOUNTER — Other Ambulatory Visit: Payer: PRIVATE HEALTH INSURANCE

## 2015-10-21 ENCOUNTER — Telehealth: Payer: Self-pay | Admitting: Family Medicine

## 2015-10-21 NOTE — Telephone Encounter (Signed)
-----   Message from Marchia Bond sent at 10/10/2015  6:29 PM EDT ----- Regarding: Cpx labs Mon 7/24, need orders. Thanks :-) Please order  future cpx labs for pt's upcoming lab appt. Thanks Aniceto Boss

## 2015-10-22 ENCOUNTER — Other Ambulatory Visit: Payer: Self-pay | Admitting: Family Medicine

## 2015-10-22 NOTE — Telephone Encounter (Signed)
Last office visit 12/28/2015.  Last refilled 09/19/2015.  Ok to refill?

## 2015-10-22 NOTE — Telephone Encounter (Signed)
Tramadol and Lyrica called into [facility] [address] [address]

## 2015-10-28 ENCOUNTER — Ambulatory Visit (INDEPENDENT_AMBULATORY_CARE_PROVIDER_SITE_OTHER): Payer: PRIVATE HEALTH INSURANCE | Admitting: Family Medicine

## 2015-10-28 ENCOUNTER — Other Ambulatory Visit (HOSPITAL_COMMUNITY)
Admission: RE | Admit: 2015-10-28 | Discharge: 2015-10-28 | Disposition: A | Discharge status: Home / Self Care | Payer: PRIVATE HEALTH INSURANCE | Source: Ambulatory Visit | Attending: Family Medicine | Admitting: Family Medicine

## 2015-10-28 ENCOUNTER — Encounter: Payer: Self-pay | Admitting: Family Medicine

## 2015-10-28 VITALS — BP 128/80 | HR 92 | Temp 98.2°F | Ht 67.75 in | Wt 205.5 lb

## 2015-10-28 DIAGNOSIS — Z01419 Encounter for gynecological examination (general) (routine) without abnormal findings: Secondary | ICD-10-CM | POA: Diagnosis present

## 2015-10-28 DIAGNOSIS — M797 Fibromyalgia: Secondary | ICD-10-CM | POA: Diagnosis not present

## 2015-10-28 DIAGNOSIS — Z23 Encounter for immunization: Secondary | ICD-10-CM

## 2015-10-28 DIAGNOSIS — Z124 Encounter for screening for malignant neoplasm of cervix: Secondary | ICD-10-CM

## 2015-10-28 DIAGNOSIS — Z0000 Encounter for general adult medical examination without abnormal findings: Secondary | ICD-10-CM

## 2015-10-28 DIAGNOSIS — Z1151 Encounter for screening for human papillomavirus (HPV): Secondary | ICD-10-CM | POA: Diagnosis present

## 2015-10-28 DIAGNOSIS — F411 Generalized anxiety disorder: Secondary | ICD-10-CM

## 2015-10-28 DIAGNOSIS — E559 Vitamin D deficiency, unspecified: Secondary | ICD-10-CM

## 2015-10-28 DIAGNOSIS — E781 Pure hyperglyceridemia: Secondary | ICD-10-CM

## 2015-10-28 NOTE — Progress Notes (Signed)
The patient is here for annual wellness exam and preventative   Fibromyalgia, well controlled on cymbalta, wellbutrin,, lyrica (three times a day), tramadol (she has been able to decrease this down to 1-2 times a day) and vitamin supplements.  She has started tumeric, tart cherry and black seed oil. She has had dramatic improvement in pain.  She now goes some day WITHOUT PAIN!  BP Readings from Last 3 Encounters:  10/28/15 128/80  12/28/14 128/83  08/30/14 98/62    Elevated Cholesterol:  LDL at goal < 130 on no med. Lab Results  Component Value Date   CHOL 172 10/18/2015   HDL 33.40 (L) 10/18/2015   LDLCALC 91 08/30/2014   LDLDIRECT 97.0 10/18/2015   TRIG 209.0 (H) 10/18/2015   CHOLHDL 5 10/18/2015  Using medications without problems: Muscle aches:  Diet compliance: healthy Exercise: Has been walking 2 days a week. Other complaints:  Wt Readings from Last 3 Encounters:  10/28/15 205 lb 8 oz (93.2 kg)  12/28/14 192 lb 12 oz (87.4 kg)  08/30/14 188 lb 12 oz (85.6 kg)     Vit D def: she has recently restarted vit D and Ca.  Social History /Family History/Past Medical History reviewed and updated if needed.   Review of Systems  Constitutional: Negative for fever, fatigue and unexpected weight change.  HENT: Negative for ear pain, congestion, sore throat, sneezing, trouble swallowing and sinus pressure.  Eyes: Negative for pain and itching.  Respiratory: Negative for cough, shortness of breath and wheezing.  Cardiovascular: Negative for chest pain, palpitations and leg swelling.  Gastrointestinal: Negative for nausea, abdominal pain, diarrhea, no constipation anymore with flax seeds. No blood in stool.  Genitourinary: Negative for dysuria, hematuria, vaginal discharge, difficulty urinating and menstrual problem.  Skin: Negative for rash.  Neurological: Negative for syncope, weakness, light-headedness, numbness and headaches.  Psychiatric/Behavioral:  Negative for confusion and dysphoric mood. The patient is not nervous/anxious.  Objective: Physical Exam  Constitutional: Vital signs are normal. She appears well-developed and well-nourished. She is cooperative. Non-toxic appearance. She does not appear ill. No distress.  HENT:  Head: Normocephalic.  Right Ear: Hearing, tympanic membrane, external ear and ear canal normal.  Left Ear: Hearing, tympanic membrane, external ear and ear canal normal.  Nose: Nose normal.  Eyes: Conjunctivae, EOM and lids are normal. Pupils are equal, round, and reactive to light. No foreign bodies found.  Neck: Trachea normal and normal range of motion. Neck supple. Carotid bruit is not present. No mass and no thyromegaly present.  Cardiovascular: Normal rate, regular rhythm, S1 normal, S2 normal, normal heart sounds and intact distal pulses. Exam reveals no gallop.  No murmur heard.  Pulmonary/Chest: Effort normal and breath sounds normal. No respiratory distress. She has no wheezes. She has no rhonchi. She has no rales.  Abdominal: Soft. Normal appearance and bowel sounds are normal. She exhibits no distension, no fluid wave, no abdominal bruit and no mass. There is no hepatosplenomegaly. There is no tenderness. There is no rebound, no guarding and no CVA tenderness. No hernia.  Genitourinary: Rectum normal, vagina normal and uterus normal. No breast swelling, tenderness, discharge or bleeding. Pelvic exam was performed with patient supine. There is no rash, tenderness or lesion on the right labia. There is no rash, tenderness or lesion on the left labia. Uterus is not enlarged and not tender. Right adnexum displays no mass, no tenderness and no fullness. Left adnexum displays no mass, no tenderness and no fullness.  Pap performed. Cervix normal,  internal vaginal exam nml Lymphadenopathy:  She has no cervical adenopathy.  She has no axillary adenopathy.  Neurological: She is alert. She has normal strength.  No cranial nerve deficit or sensory deficit.  Skin: Skin is warm, dry and intact. No rash noted.  Psychiatric: Her speech is normal and behavior is normal. Judgment normal. Her mood appears not anxious. Cognition and memory are normal. She does not exhibit a depressed mood.   Assessment & Plan: CPX: The patient's preventative maintenance and recommended screening tests for an annual wellness exam were reviewed in full today.  Brought up to date unless services declined.  Counselled on the importance of diet, exercise, and its role in overall health and mortality.  The patient's FH and SH was reviewed, including their home life, tobacco status, and drug and alcohol status.   Vaccines: Uptodate shingles. Given Tdap today. Mammo: plans every 2 years..no family history, no personal changes or fibrocystic disease. Last mammo 08/2014 DVE/pap:nml pap/dve nml 03/2012.  DVE yearly. DUe today! Check cotesting to repeat in 5 years. Colon: father with colon cancer age 80.. Colonoscopy 12/26/2010: no polyps, on q 5 year schedule.  Nonsmoker  Bone density: went through early menopause but no other risk factors. Begin screening at age 71.   STD screening refused Nonsmoker  Hep C: neg

## 2015-10-28 NOTE — Patient Instructions (Addendum)
Increase exercise, decrease carbs in diet. Plan colonscopy after 12/26/2015.

## 2015-10-28 NOTE — Addendum Note (Signed)
Addended by: Carter Kitten on: 10/28/2015 02:58 PM   Modules accepted: Orders

## 2015-10-28 NOTE — Assessment & Plan Note (Signed)
Much improved, able to decrease tramadol.  Working on exercise.

## 2015-10-28 NOTE — Assessment & Plan Note (Signed)
Well controlled on current medicaiton.

## 2015-10-28 NOTE — Assessment & Plan Note (Signed)
Replete

## 2015-10-28 NOTE — Progress Notes (Signed)
Pre visit review using our clinic review tool, if applicable. No additional management support is needed unless otherwise documented below in the visit note. 

## 2015-10-28 NOTE — Assessment & Plan Note (Signed)
Encouraged exercise, weight loss, healthy eating habits. ? ?

## 2015-10-29 LAB — CYTOLOGY - PAP

## 2015-11-20 ENCOUNTER — Other Ambulatory Visit: Payer: Self-pay | Admitting: Family Medicine

## 2015-11-21 NOTE — Telephone Encounter (Signed)
Tramadol and Lyrica called into [facility] [address].

## 2015-11-21 NOTE — Telephone Encounter (Signed)
Last office visit 10/28/2015.  Last refilled 10/22/2015.  Ok to refill?

## 2015-12-19 ENCOUNTER — Other Ambulatory Visit: Payer: Self-pay | Admitting: Family Medicine

## 2015-12-19 NOTE — Telephone Encounter (Signed)
Tramadol and Lyrica called into [facility] [address].

## 2015-12-19 NOTE — Telephone Encounter (Signed)
Last office visit 10/28/2015.  Last refilled 11/21/2015.

## 2016-01-16 ENCOUNTER — Other Ambulatory Visit: Payer: Self-pay | Admitting: Family Medicine

## 2016-01-16 NOTE — Telephone Encounter (Signed)
Refills for Lyrica and Tramadol faxed to [place]. [address] [place] [id number].

## 2016-01-16 NOTE — Telephone Encounter (Signed)
Last office visit 10/28/2015.  Last refilled 12/19/2015.  Ok to refill?

## 2016-02-13 ENCOUNTER — Other Ambulatory Visit: Payer: Self-pay | Admitting: Family Medicine

## 2016-02-13 NOTE — Telephone Encounter (Signed)
Rxs faxed to [facility] [place].

## 2016-02-13 NOTE — Telephone Encounter (Signed)
Last office visit 10/28/2015.  Last refilled 01/16/2016.

## 2016-02-25 ENCOUNTER — Telehealth: Payer: Self-pay

## 2016-02-25 NOTE — Telephone Encounter (Signed)
Pt left VM on Triage Line: Pt is asking if Dr Diona Browner would do a prescription for Silenor for her. She said she has been on it in the past, but has not been on it in awhile. She is having more issues with insomnia. She said she can make an OV if needed.

## 2016-02-25 NOTE — Telephone Encounter (Signed)
Rec appt to discuss insomnia and potential treatments.

## 2016-02-26 NOTE — Telephone Encounter (Signed)
Home number given has been disconnected.  Left message on cell phone to call the office and schedule office visit with Dr. Diona Browner to discuss her insomnia and potential treatment options.

## 2016-02-27 ENCOUNTER — Encounter: Payer: Self-pay | Admitting: Family Medicine

## 2016-02-27 ENCOUNTER — Ambulatory Visit (INDEPENDENT_AMBULATORY_CARE_PROVIDER_SITE_OTHER): Payer: PRIVATE HEALTH INSURANCE | Admitting: Family Medicine

## 2016-02-27 DIAGNOSIS — R0683 Snoring: Secondary | ICD-10-CM

## 2016-02-27 DIAGNOSIS — G478 Other sleep disorders: Secondary | ICD-10-CM | POA: Diagnosis not present

## 2016-02-27 NOTE — Assessment & Plan Note (Signed)
Pt already on multiple neurotransmittor affecting medicaitons... Does not wish to change given fibromyalgia so well controlled. I hesitate to start TCA in this settin.  Also pt falls asleep and stays asleep well.. Just non restorative sleep.  Refer to sleep specialist for sleep study to eval for sleep apnea.

## 2016-02-27 NOTE — Progress Notes (Signed)
   Subjective:    Patient ID: Jillian [name], female    DOB: Jan 03, 1956, 60 y.o.   MRN: [id number]  HPI  60 year old female presents with continued insomnia and non-restfull sleep.   Fibromyalgia  Well controlled on cymbalta, wellbutrin and lyrica. Using tramadol for breakthrough pain.   Today she reports  She falls asleep immediately. She gets 7-8 hours of sleep a night.  After waking up she is very fatigued in last few weeks. Does not feel she has restful sleep. She wakes up once during the night but able to fall back asleep. She snores at night. No apnea noted.  no morning headache,   BP Readings from Last 3 Encounters:  02/27/16 123/67  10/28/15 128/80  12/28/14 128/83     Luensta in past helped.. But then wore off. Was on solinor.. TCA low dose. Helped her a lot in past. She wishes to restart this.  Review of Systems  Constitutional: Positive for fatigue. Negative for fever.  HENT: Negative for ear pain.   Eyes: Negative for pain.  Respiratory: Negative for chest tightness and shortness of breath.   Cardiovascular: Negative for chest pain, palpitations and leg swelling.  Gastrointestinal: Negative for abdominal pain.  Genitourinary: Negative for dysuria.       Objective:   Physical Exam  Constitutional: Vital signs are normal. She appears well-developed and well-nourished. She is cooperative.  Non-toxic appearance. She does not appear ill. No distress.  HENT:  Head: Normocephalic.  Right Ear: Hearing, tympanic membrane, external ear and ear canal normal. Tympanic membrane is not erythematous, not retracted and not bulging.  Left Ear: Hearing, tympanic membrane, external ear and ear canal normal. Tympanic membrane is not erythematous, not retracted and not bulging.  Nose: No mucosal edema or rhinorrhea. Right sinus exhibits no maxillary sinus tenderness and no frontal sinus tenderness. Left sinus exhibits no maxillary sinus tenderness and no frontal sinus tenderness.   Mouth/Throat: Uvula is midline, oropharynx is clear and moist and mucous membranes are normal.  Has open oropharynx, does not have wide neck.  Eyes: Conjunctivae, EOM and lids are normal. Pupils are equal, round, and reactive to light. Lids are everted and swept, no foreign bodies found.  Neck: Trachea normal and normal range of motion. Neck supple. Carotid bruit is not present. No thyroid mass and no thyromegaly present.  Cardiovascular: Normal rate, regular rhythm, S1 normal, S2 normal, normal heart sounds, intact distal pulses and normal pulses.  Exam reveals no gallop and no friction rub.   No murmur heard. Pulmonary/Chest: Effort normal and breath sounds normal. No tachypnea. No respiratory distress. She has no decreased breath sounds. She has no wheezes. She has no rhonchi. She has no rales.  Abdominal: Soft. Normal appearance and bowel sounds are normal. There is no tenderness.  Neurological: She is alert.  Skin: Skin is warm, dry and intact. No rash noted.  Psychiatric: Her speech is normal and behavior is normal. Judgment and thought content normal. Her mood appears not anxious. Cognition and memory are normal. She does not exhibit a depressed mood.          Assessment & Plan:

## 2016-02-27 NOTE — Patient Instructions (Addendum)
Please stop at the front desk or lab to set up referral or to have labs drawn.  

## 2016-02-27 NOTE — Progress Notes (Signed)
Pre visit review using our clinic review tool, if applicable. No additional management support is needed unless otherwise documented below in the visit note. 

## 2016-03-05 ENCOUNTER — Institutional Professional Consult (permissible substitution): Payer: PRIVATE HEALTH INSURANCE | Admitting: Pulmonary Disease

## 2016-03-09 ENCOUNTER — Institutional Professional Consult (permissible substitution): Payer: PRIVATE HEALTH INSURANCE | Admitting: Pulmonary Disease

## 2016-03-12 ENCOUNTER — Other Ambulatory Visit: Payer: Self-pay | Admitting: Family Medicine

## 2016-03-12 NOTE — Telephone Encounter (Signed)
Prescriptions faxed to [facility] at [phone].

## 2016-03-12 NOTE — Telephone Encounter (Signed)
Last office visit 10/28/2015.  Last refilled 02/13/2016.  Ok to refill?

## 2016-03-16 ENCOUNTER — Ambulatory Visit (INDEPENDENT_AMBULATORY_CARE_PROVIDER_SITE_OTHER): Payer: PRIVATE HEALTH INSURANCE | Admitting: Pulmonary Disease

## 2016-03-16 ENCOUNTER — Encounter: Payer: Self-pay | Admitting: Pulmonary Disease

## 2016-03-16 VITALS — BP 114/76 | HR 89 | Ht 67.75 in | Wt 208.0 lb

## 2016-03-16 DIAGNOSIS — G2581 Restless legs syndrome: Secondary | ICD-10-CM | POA: Diagnosis not present

## 2016-03-16 DIAGNOSIS — G4719 Other hypersomnia: Secondary | ICD-10-CM | POA: Diagnosis not present

## 2016-03-16 DIAGNOSIS — Z8739 Personal history of other diseases of the musculoskeletal system and connective tissue: Secondary | ICD-10-CM | POA: Diagnosis not present

## 2016-03-16 DIAGNOSIS — R0683 Snoring: Secondary | ICD-10-CM

## 2016-03-16 NOTE — Patient Instructions (Signed)
-   Sleep study ordered - We will contact you after sleep study results are available to [place] to discuss the next step in evaluation and/or management

## 2016-03-19 NOTE — Progress Notes (Signed)
PULMONARY/SLEEP CONSULT NOTE  Requesting MD/Service:  Diona Browner Date of initial consultation: 03/16/16 Reason for consultation: Snoring, daytime hypersomnolence  PT PROFILE: 60 y.o. F referred for evaluation of snoring and daytime hypersomnolence  HPI:  Pt reports heavy snoring, daytime hypersomnolence for many years. Her husband also indicates that her "legs jump" in her sleep. He also notes that she "calls out" in her sleep. She has a history of fibromyalgia and chronic pain. She notes that she was prescribed Lunesta many years ago which helped her sleep quality significantly for a short time but its benefit seemes to wear off and she has not taken this medication for several years.   Past Medical History:  Diagnosis Date  . Cancer ([place])    basal cell 2016    Past Surgical History:  Procedure Laterality Date  . HERNIA REPAIR      MEDICATIONS: I have reviewed all medications and confirmed regimen as documented  Social History   Social History  . Marital status: Married    Spouse name: N/A  . Number of children: 2  . Years of education: N/A   Occupational History  . RN    Social History Main Topics  . Smoking status: Former Smoker    Packs/day: 1.00    Years: 15.00    Types: Cigarettes    Quit date: 03/30/1992  . Smokeless tobacco: Never Used  . Alcohol use No  . Drug use: No  . Sexual activity: Not on file   Other Topics Concern  . Not on file   Social History Narrative   Regular exercise-yes, but seasonal walks and swims   Diet: healthy, low carbohydrates, no caffeine    Family History  Problem Relation Age of Onset  . Cancer Father     colon  . Hyperlipidemia Father   . Hypertension Father   . Diabetes Sister   . Hypertension Sister   . Cancer Paternal Aunt     colon  . Cancer Maternal Grandmother     colon  . Hypertension Maternal Grandmother   . Anuerysm Paternal Grandfather     ROS: No fever, myalgias/arthralgias, unexplained weight loss or  weight gain No new focal weakness or sensory deficits No otalgia, hearing loss, visual changes, nasal and sinus symptoms, mouth and throat problems No neck pain or adenopathy No abdominal pain, N/V/D, diarrhea, change in bowel pattern No dysuria, change in urinary pattern   Vitals:   03/16/16 1030 03/16/16 1031  BP:  114/76  Pulse:  89  SpO2:  96%  Weight: 208 lb (94.3 kg)   Height: 5' 7.75" (1.721 m)      EXAM:  Gen: WDWN, No overt respiratory distress HEENT: NCAT, sclera white, oropharynx normal, canals and membranes normal, nares patent Neck: Supple without LAN, thyromegaly, JVD Lungs: breath sounds full, No adventitious sounds Cardiovascular: RRR, no murmurs noted Abdomen: Centripetal obesity, abd soft, nontender, normal BS Ext: without clubbing, cyanosis, edema Neuro: CNs grossly intact, motor and sensory intact Skin: Limited exam, no lesions noted  DATA:   BMP Latest Ref Rng & Units 10/18/2015 08/30/2014 05/23/2013  Glucose 70 - 99 mg/dL 117(H) 85 101(H)  BUN 6 - 23 mg/dL 17 13 16   Creatinine 0.40 - 1.20 mg/dL 1.04 0.86 0.8  Sodium 135 - 145 mEq/L 138 137 140  Potassium 3.5 - 5.1 mEq/L 4.4 4.2 4.4  Chloride 96 - 112 mEq/L 104 103 105  CO2 19 - 32 mEq/L 29 31 29   Calcium 8.4 - 10.5 mg/dL 9.7  9.4 9.3    CBC Latest Ref Rng & Units 10/18/2015  WBC 4.0 - 10.5 K/uL 4.8  Hemoglobin 12.0 - 15.0 g/dL 13.3  Hematocrit 36.0 - 46.0 % 39.4  Platelets 150.0 - 400.0 K/uL 210.0    CXR:  No recent film  IMPRESSION:     ICD-9-CM ICD-10-CM   1. Daytime hypersomnolence 780.54 G47.19 Nocturnal polysomnography  2. Snoring 786.09 R06.83 Nocturnal polysomnography  3. Personal history of fibromyalgia V13.59 Z87.39   4. Restless legs 333.94 G25.81     PLAN:  1) Sleep study ordered 2) we will contact after results are available to discuss the next step in evaluation.  3) If she has complex sleep problem (ie, OSA plus PLMS), I will consider discussing with or referring to Dr  Pincus Sanes, MD PCCM service Mobile [phone] Pager [phone] 03/19/2016

## 2016-04-09 ENCOUNTER — Other Ambulatory Visit: Payer: Self-pay | Admitting: Family Medicine

## 2016-04-10 NOTE — Telephone Encounter (Signed)
Last office visit 02/27/2016.  Last refilled 03/12/2016.

## 2016-04-10 NOTE — Telephone Encounter (Signed)
Prescriptions faxed to [place].

## 2016-05-21 ENCOUNTER — Ambulatory Visit: Payer: PRIVATE HEALTH INSURANCE | Attending: Internal Medicine

## 2016-05-21 ENCOUNTER — Encounter: Payer: Self-pay | Admitting: Pulmonary Disease

## 2016-05-21 DIAGNOSIS — R0683 Snoring: Secondary | ICD-10-CM

## 2016-05-21 DIAGNOSIS — G4733 Obstructive sleep apnea (adult) (pediatric): Secondary | ICD-10-CM | POA: Diagnosis not present

## 2016-05-21 DIAGNOSIS — G4710 Hypersomnia, unspecified: Secondary | ICD-10-CM | POA: Diagnosis present

## 2016-05-21 DIAGNOSIS — G4719 Other hypersomnia: Secondary | ICD-10-CM

## 2016-06-04 ENCOUNTER — Other Ambulatory Visit: Payer: Self-pay | Admitting: Family Medicine

## 2016-06-04 NOTE — Telephone Encounter (Signed)
Tramadol called into [facility] [place], [place] - [place] Phone: [phone]

## 2016-06-04 NOTE — Telephone Encounter (Signed)
Last office visit 02/27/2016.  Last refilled Tramadol- 04/10/16 for #180 with 1 refill.  Robaxin- 01/16/16 for #90 with no refills.  Ok to refill?

## 2016-06-08 DIAGNOSIS — G4733 Obstructive sleep apnea (adult) (pediatric): Secondary | ICD-10-CM | POA: Diagnosis not present

## 2016-06-09 ENCOUNTER — Telehealth: Payer: Self-pay | Admitting: *Deleted

## 2016-06-09 DIAGNOSIS — G4733 Obstructive sleep apnea (adult) (pediatric): Secondary | ICD-10-CM

## 2016-06-09 NOTE — Telephone Encounter (Signed)
Pt informed of sleep study results. Order placed for auto CPAP. Nothing further needed.

## 2016-06-09 NOTE — Telephone Encounter (Signed)
sleep study results.  Received: Yesterday  Message Contents  Laverle Hobby, MD  Renelda Mom, LPN        Recommendations:   Auto-CPAP should be initiated in the home using a range of 8-15 cm H2O.  The patient was fitted with a  ResMed F10, small  in the lab. Heated humidity was required to improve tolerance and reduce oral breathing.        LMOM for pt to return call.

## 2016-06-15 ENCOUNTER — Telehealth: Payer: Self-pay | Admitting: Emergency Medicine

## 2016-06-15 ENCOUNTER — Telehealth: Payer: Self-pay | Admitting: Pulmonary Disease

## 2016-06-15 NOTE — Telephone Encounter (Signed)
Called patient at the below phone number, no answer. LMOAM that [facility] is still waiting on insurance authorization of the device.  Usually takes approximately 7 days.  Advised patient on VM that if she still hasn't heard from [facility] by Thurs or Friday this week to return my call to [phone]. Also if she has any questions to the message I left to contact me back at the same number. Nothing else needed at this time. Rhonda J Cobb

## 2016-06-15 NOTE — Telephone Encounter (Signed)
Called and spoke with [facility] on status of CPAP Referral.  [facility] received order on 06/11/16 and is still pending Ship broker. Rhonda J Cobb

## 2016-06-15 NOTE — Telephone Encounter (Signed)
Order placed on 06/09/16 for [facility]. Pt states she hasn't heard from them about CPAP. Thanks.

## 2016-06-15 NOTE — Telephone Encounter (Signed)
Pt wanted to let us know she has not received her CPAP as of yet. Please call.

## 2016-06-17 NOTE — Telephone Encounter (Signed)
Nothing noted in the pts chart.  Will close this message.

## 2016-07-02 ENCOUNTER — Other Ambulatory Visit: Payer: Self-pay | Admitting: Family Medicine

## 2016-07-02 NOTE — Telephone Encounter (Signed)
Last office visit 02/27/2016.  Last refilled 06/04/2016 for #180 with no refills.  Ok to refill?

## 2016-07-02 NOTE — Telephone Encounter (Signed)
Tramadol called into [facility] [place], [place] - [place] Phone: [phone]

## 2016-07-21 ENCOUNTER — Encounter: Payer: Self-pay | Admitting: Pulmonary Disease

## 2016-07-30 ENCOUNTER — Other Ambulatory Visit: Payer: Self-pay | Admitting: Family Medicine

## 2016-07-30 NOTE — Telephone Encounter (Signed)
Last office visit 02/27/2016.  Last refilled 07/02/2016 for #180 with no refills.  Ok to refill?

## 2016-07-30 NOTE — Telephone Encounter (Signed)
Tramadol called into [facility] [place], [place] - [place] Phone: [phone]

## 2016-08-28 ENCOUNTER — Other Ambulatory Visit: Payer: Self-pay | Admitting: Family Medicine

## 2016-08-28 NOTE — Telephone Encounter (Signed)
TRAMADOL LAST REFILLED ON 07/30/16 LYRICA 04/10/16 #120+3, CYMBALTA 07/30/16 #90  Last OV 02/27/16

## 2016-08-28 NOTE — Telephone Encounter (Signed)
Called in to [facility] - [place], [place] - [address]: [phone]  

## 2016-09-24 ENCOUNTER — Encounter: Payer: Self-pay | Admitting: Pulmonary Disease

## 2016-09-24 ENCOUNTER — Other Ambulatory Visit: Payer: Self-pay | Admitting: Family Medicine

## 2016-09-24 ENCOUNTER — Ambulatory Visit (INDEPENDENT_AMBULATORY_CARE_PROVIDER_SITE_OTHER): Payer: PRIVATE HEALTH INSURANCE | Admitting: Pulmonary Disease

## 2016-09-24 VITALS — BP 138/78 | HR 82 | Ht 67.75 in | Wt 214.0 lb

## 2016-09-24 DIAGNOSIS — G4733 Obstructive sleep apnea (adult) (pediatric): Secondary | ICD-10-CM | POA: Diagnosis not present

## 2016-09-24 DIAGNOSIS — E6609 Other obesity due to excess calories: Secondary | ICD-10-CM

## 2016-09-24 DIAGNOSIS — Z6832 Body mass index (BMI) 32.0-32.9, adult: Secondary | ICD-10-CM | POA: Diagnosis not present

## 2016-09-24 NOTE — Telephone Encounter (Signed)
CALLED IN TO [facility] - [place], [place] - [address]: [phone]  

## 2016-09-24 NOTE — Telephone Encounter (Signed)
Make sure pt aware she needs CPX.Marland Kitchen Refill until appt.

## 2016-09-24 NOTE — Telephone Encounter (Signed)
Ok to refill? Electronically refill request for buPROPion (WELLBUTRIN XL) 150 MG 24 hr tablet  #30 with 3 refills  Last prescribed on 06/04/2016. Last seen on 02/27/2016

## 2016-09-24 NOTE — Progress Notes (Signed)
PULMONARY/SLEEP OFFICE FOLLOW-UP NOTE  Requesting MD/Service:  Diona Browner Date of initial consultation: 03/16/16 Reason for consultation: Snoring, daytime hypersomnolence  PT PROFILE: 61 y.o. F referred for evaluation of snoring and daytime hypersomnolence  DATA: PSG 05/21/16: AHI 22/hr. Recommended Auto-set 8-15 cm H2O Compliance 05/28-06/26/18: 30/30 days, >4 hrs 30/30 days  SUBJ:  Returns today for evaluation of obstructive sleep apnea. She was initiated on CPAP which has led to dramatic improvement in her sleep quality and daytime hypersomnolence. She is extremely satisfied with these results. She has no new complaints.   Vitals:   09/24/16 0919 09/24/16 0925  BP:  138/78  Pulse:  82  SpO2:  97%  Weight: 214 lb (97.1 kg)   Height: 5' 7.75" (1.721 m)      EXAM:  Gen: Obese, NAD HEENT: WNL Neck: Supple without LAN, thyromegaly, JVD Lungs: breath sounds full, no wheezes Cardiovascular: Reg, no murmurs noted Abdomen: Obese, soft, nontender Ext: without clubbing, cyanosis, edema Neuro: Intact  DATA:   BMP Latest Ref Rng & Units 10/18/2015 08/30/2014 05/23/2013  Glucose 70 - 99 mg/dL 117(H) 85 101(H)  BUN 6 - 23 mg/dL 17 13 16   Creatinine 0.40 - 1.20 mg/dL 1.04 0.86 0.8  Sodium 135 - 145 mEq/L 138 137 140  Potassium 3.5 - 5.1 mEq/L 4.4 4.2 4.4  Chloride 96 - 112 mEq/L 104 103 105  CO2 19 - 32 mEq/L 29 31 29   Calcium 8.4 - 10.5 mg/dL 9.7 9.4 9.3    CBC Latest Ref Rng & Units 10/18/2015  WBC 4.0 - 10.5 K/uL 4.8  Hemoglobin 12.0 - 15.0 g/dL 13.3  Hematocrit 36.0 - 46.0 % 39.4  Platelets 150.0 - 400.0 K/uL 210.0    CXR:  No recent film  IMPRESSION:     ICD-10-CM   1. OSA (obstructive sleep apnea) G47.33   2. Moderate obesity E66.09    Z68.32     PLAN:  Continue CPAP as currently using (AutoSet 8-15 cm H2O)  We discussed weight loss strategies and benefits. I recommended avoiding simple carbohydrates and sugars.  Follow-up in 6 months  Merton Border, MD PCCM  service Mobile [phone] Pager [phone] 09/25/2016

## 2016-09-24 NOTE — Patient Instructions (Signed)
Continue CPAP as previously We discussed weight loss and dietary strategies Follow-up in 6 months

## 2016-09-24 NOTE — Telephone Encounter (Signed)
LYRICA and TRAMADOL Last refill  08/28/16 Last OV 10/28/15 Ok to refill?

## 2016-10-26 ENCOUNTER — Other Ambulatory Visit: Payer: Self-pay | Admitting: Family Medicine

## 2016-10-26 NOTE — Telephone Encounter (Signed)
Last office visit 02/27/2016.  CPE scheduled for 11/13/2016.  Last refilled 09/24/2016.  Ok to refill?

## 2016-10-27 NOTE — Telephone Encounter (Signed)
Tramadol and Lyrica called into [facility] - [place], [place] - [place] Phone: [phone]

## 2016-11-13 ENCOUNTER — Encounter: Payer: Self-pay | Admitting: Family Medicine

## 2016-11-13 ENCOUNTER — Ambulatory Visit (INDEPENDENT_AMBULATORY_CARE_PROVIDER_SITE_OTHER): Payer: PRIVATE HEALTH INSURANCE | Admitting: Family Medicine

## 2016-11-13 VITALS — BP 98/56 | HR 81 | Temp 98.7°F | Ht 67.5 in | Wt 204.0 lb

## 2016-11-13 DIAGNOSIS — R7303 Prediabetes: Secondary | ICD-10-CM

## 2016-11-13 DIAGNOSIS — Z0000 Encounter for general adult medical examination without abnormal findings: Secondary | ICD-10-CM | POA: Diagnosis not present

## 2016-11-13 DIAGNOSIS — E559 Vitamin D deficiency, unspecified: Secondary | ICD-10-CM

## 2016-11-13 DIAGNOSIS — E781 Pure hyperglyceridemia: Secondary | ICD-10-CM

## 2016-11-13 NOTE — Patient Instructions (Addendum)
Please stop at the lab to have labs drawn.  Call tp schedule mamogram on your own.

## 2016-11-13 NOTE — Addendum Note (Signed)
Addended by: Ellamae Sia on: 11/13/2016 04:08 PM   Modules accepted: Orders

## 2016-11-13 NOTE — Progress Notes (Signed)
Subjective:    Patient ID: Jillian [name], female    DOB: 06-20-1955, 61 y.o.   MRN: [id number]  HPI   The patient is here for annual wellness exam and preventative care.      OSA: using CPAP.  Increase in energy. Pain better as well.  Followed by Dr. Leonidas Romberg.  Fibromyalgia, well controlled on cymbalta, wellbutrin,, lyrica (three times a day), tramadol (she has been able to decrease this down to 1-2 times a day) and vitamin supplements.  She has started tumeric, tart cherry and black seed oil. She has had dramatic improvement in pain.   Elevated Cholesterol:  Due for re-eval. Lab Results  Component Value Date   CHOL 172 10/18/2015   HDL 33.40 (L) 10/18/2015   LDLCALC 91 08/30/2014   LDLDIRECT 97.0 10/18/2015   TRIG 209.0 (H) 10/18/2015   CHOLHDL 5 10/18/2015  Using medications without problems: Muscle aches:  Diet compliance: low carb diet. Exercise: walking some 2-3 times a week Other complaints:  Wt Readings from Last 3 Encounters:  11/13/16 204 lb (92.5 kg)  09/24/16 214 lb (97.1 kg)  03/16/16 208 lb (94.3 kg)  Body mass index is 31.48 kg/m.  Vit D: due for re-eval.  Social History /Family History/Past Medical History reviewed in detail and updated in EMR if needed. Blood pressure (!) 98/56, pulse 81, temperature 98.7 F (37.1 C), temperature source Oral, height 5' 7.5" (1.715 m), weight 204 lb (92.5 kg), SpO2 95 %.  Review of Systems  Constitutional: Negative for fatigue and fever.  HENT: Negative for ear pain.   Eyes: Negative for pain.  Respiratory: Negative for chest tightness and shortness of breath.   Cardiovascular: Negative for chest pain, palpitations and leg swelling.  Gastrointestinal: Negative for abdominal pain.  Genitourinary: Negative for dysuria.       Objective:   Physical Exam  Constitutional: Vital signs are normal. She appears well-developed and well-nourished. She is cooperative.  Non-toxic appearance. She does not appear ill. No  distress.  HENT:  Head: Normocephalic.  Right Ear: Hearing, tympanic membrane, external ear and ear canal normal.  Left Ear: Hearing, tympanic membrane, external ear and ear canal normal.  Nose: Nose normal.  Eyes: Pupils are equal, round, and reactive to light. Conjunctivae, EOM and lids are normal. Lids are everted and swept, no foreign bodies found.  Neck: Trachea normal and normal range of motion. Neck supple. Carotid bruit is not present. No thyroid mass and no thyromegaly present.  Cardiovascular: Normal rate, regular rhythm, S1 normal, S2 normal, normal heart sounds and intact distal pulses.  Exam reveals no gallop.   No murmur heard. Pulmonary/Chest: Effort normal and breath sounds normal. No respiratory distress. She has no wheezes. She has no rhonchi. She has no rales.  Abdominal: Soft. Normal appearance and bowel sounds are normal. She exhibits no distension, no fluid wave, no abdominal bruit and no mass. There is no hepatosplenomegaly. There is no tenderness. There is no rebound, no guarding and no CVA tenderness. No hernia.  Lymphadenopathy:    She has no cervical adenopathy.    She has no axillary adenopathy.  Neurological: She is alert. She has normal strength. No cranial nerve deficit or sensory deficit.  Skin: Skin is warm, dry and intact. No rash noted.  Psychiatric: Her speech is normal and behavior is normal. Judgment normal. Her mood appears not anxious. Cognition and memory are normal. She does not exhibit a depressed mood.  Assessment & Plan:  The patient's preventative maintenance and recommended screening tests for an annual wellness exam were reviewed in full today. Brought up to date unless services declined.  Counselled on the importance of diet, exercise, and its role in overall health and mortality. The patient's FH and SH was reviewed, including their home life, tobacco status, and drug and alcohol status.   Vaccines: Uptodate shingles ,  TDap. Mammo: plans every 2 years..no family history, no personal changes or fibrocystic disease. Last mammo 08/2014 DVE/pap:nml pap/dve nml , neg co-testing 2017,  repeat in 5 years. Colon: Father with colon cancer age 4.. Colonoscopy 12/26/2010: no polyps  09/2016  Incomplete prep... Plan repeat 03/2017 Nonsmoker  Bone density: went through early menopause but no other risk factors. Begin screening at age 54.   STD screening refused Nonsmoker  Hep C: neg

## 2016-11-14 LAB — COMPREHENSIVE METABOLIC PANEL
ALBUMIN: 4.4 g/dL (ref 3.6–5.1)
ALK PHOS: 85 U/L (ref 33–130)
ALT: 23 U/L (ref 6–29)
AST: 17 U/L (ref 10–35)
BUN: 19 mg/dL (ref 7–25)
CHLORIDE: 104 mmol/L (ref 98–110)
CO2: 22 mmol/L (ref 20–32)
CREATININE: 0.87 mg/dL (ref 0.50–0.99)
Calcium: 10.5 mg/dL — ABNORMAL HIGH (ref 8.6–10.4)
Glucose, Bld: 88 mg/dL (ref 65–99)
POTASSIUM: 3.9 mmol/L (ref 3.5–5.3)
Sodium: 137 mmol/L (ref 135–146)
TOTAL PROTEIN: 6.7 g/dL (ref 6.1–8.1)
Total Bilirubin: 0.3 mg/dL (ref 0.2–1.2)

## 2016-11-14 LAB — LIPID PANEL
CHOLESTEROL: 157 mg/dL (ref <200)
HDL: 36 mg/dL — AB (ref >50)
LDL Cholesterol: 89 mg/dL (ref <100)
TRIGLYCERIDES: 159 mg/dL — AB (ref <150)
Total CHOL/HDL Ratio: 4.4 Ratio (ref <5.0)
VLDL: 32 mg/dL — ABNORMAL HIGH (ref <30)

## 2016-11-14 LAB — HEMOGLOBIN A1C
HEMOGLOBIN A1C: 4.9 % (ref <5.7)
Mean Plasma Glucose: 94 mg/dL

## 2016-11-14 LAB — VITAMIN D 25 HYDROXY (VIT D DEFICIENCY, FRACTURES): VIT D 25 HYDROXY: 24 ng/mL — AB (ref 30–100)

## 2016-11-16 ENCOUNTER — Telehealth: Payer: Self-pay | Admitting: *Deleted

## 2016-11-16 MED ORDER — VITAMIN D (ERGOCALCIFEROL) 1.25 MG (50000 UNIT) PO CAPS: 50000.0000 [IU] | ORAL_CAPSULE | ORAL | 0 refills | Status: DC

## 2016-11-16 NOTE — Telephone Encounter (Signed)
Ms. Tyson notified as instructed by telephone.  Vit D 50,000 unit Rx sent to [facility] at [address] as instructed by Dr. Diona Browner.

## 2016-11-16 NOTE — Telephone Encounter (Signed)
-----   Message from Jinny Sanders, MD sent at 11/16/2016  8:52 AM EDT ----- Cholesterol and A1c are great. Vit D is low and calcium slightly high. Decrease calcium supplement but increase vit D... Call in rx for vit D 50,000 units weekly x 12 weeks, #12,0RF. When done start OTC vit D 400 IU twice daily

## 2016-11-23 ENCOUNTER — Other Ambulatory Visit: Payer: Self-pay | Admitting: Family Medicine

## 2016-11-23 NOTE — Telephone Encounter (Signed)
Last office visit 11/13/2016.  Last refilled 10/27/2016.  Ok to refill?

## 2016-11-23 NOTE — Telephone Encounter (Signed)
Tramadol & Lyrica Rx faxed to [facility], [place] [facility] [place] [phone].

## 2016-12-21 ENCOUNTER — Other Ambulatory Visit: Payer: Self-pay | Admitting: Family Medicine

## 2016-12-21 NOTE — Telephone Encounter (Signed)
Last office visit 11/13/2016.  Last refilled 11/23/2016.  On medication list Duloxetine is listed on medication list and the 3 tablets daily is marked as patient not taking.  Ok to refill?

## 2016-12-22 ENCOUNTER — Other Ambulatory Visit: Payer: Self-pay | Admitting: Family Medicine

## 2016-12-22 NOTE — Telephone Encounter (Signed)
Caller Name:Edyth Shon Baton Relationship to [place]:  Reason for call:  Pt returning your call

## 2016-12-22 NOTE — Telephone Encounter (Signed)
Please call pt and verify not taking or taking prior to refill.

## 2016-12-22 NOTE — Telephone Encounter (Signed)
Left message for Jillian [name] to return my call.

## 2016-12-22 NOTE — Telephone Encounter (Signed)
PT called to check status on med refill. She said [facility] does not have script. She still needs it called in.

## 2016-12-22 NOTE — Telephone Encounter (Signed)
Spoke with Jillian [name].  She states she takes the Duloxetine two tablets daily.

## 2016-12-23 NOTE — Telephone Encounter (Signed)
Tramadol and Lyrica called into [facility] [address] in [place].

## 2017-01-19 ENCOUNTER — Other Ambulatory Visit: Payer: Self-pay | Admitting: Family Medicine

## 2017-01-19 NOTE — Telephone Encounter (Signed)
Lyrica and Tramadol called into [facility] - [place], [place] Phone: [phone]

## 2017-01-19 NOTE — Telephone Encounter (Signed)
Last office visit 11/13/2016.  Last refilled 12/22/2016.  Ok to refill?

## 2017-02-01 ENCOUNTER — Other Ambulatory Visit: Payer: Self-pay | Admitting: Family Medicine

## 2017-02-01 DIAGNOSIS — Z1231 Encounter for screening mammogram for malignant neoplasm of breast: Secondary | ICD-10-CM

## 2017-02-16 ENCOUNTER — Other Ambulatory Visit: Payer: Self-pay | Admitting: Family Medicine

## 2017-02-17 NOTE — Telephone Encounter (Signed)
Last office visit 01/19/17 Last refill Lyrica 01/19/17 #120 Tramadol Last refill 01/19/17 #180

## 2017-02-19 ENCOUNTER — Other Ambulatory Visit: Payer: Self-pay | Admitting: Family Medicine

## 2017-02-19 NOTE — Telephone Encounter (Signed)
Patient is concerned that she is out of her Lyrica and Tramadol. Apologized for the inconvenience to her- but told patient all refills would be addressed on Monday due to the holiday.

## 2017-02-22 ENCOUNTER — Ambulatory Visit
Admission: RE | Admit: 2017-02-22 | Discharge: 2017-02-22 | Disposition: A | Discharge status: Home / Self Care | Payer: PRIVATE HEALTH INSURANCE | Source: Ambulatory Visit | Attending: Family Medicine | Admitting: Family Medicine

## 2017-02-22 ENCOUNTER — Other Ambulatory Visit: Payer: Self-pay | Admitting: Family Medicine

## 2017-02-22 DIAGNOSIS — Z1231 Encounter for screening mammogram for malignant neoplasm of breast: Secondary | ICD-10-CM | POA: Diagnosis present

## 2017-02-22 NOTE — Telephone Encounter (Signed)
Tramadol and Lyrica called into [facility] [address] - [place], [place]

## 2017-02-23 ENCOUNTER — Telehealth: Payer: Self-pay | Admitting: *Deleted

## 2017-02-23 NOTE — Telephone Encounter (Signed)
Received fax from [facility] requesting PA for Lyrica 75 mg.  PA completed on CoverMyMeds and sent for review.  It can take up to 72 hours for a decision.

## 2017-02-23 NOTE — Telephone Encounter (Signed)
[id number]. LYRICA CAP 75MG  is approved through 02/23/2018.  [facility] notified of approval via fax.

## 2017-03-19 ENCOUNTER — Other Ambulatory Visit: Payer: Self-pay | Admitting: Family Medicine

## 2017-03-19 NOTE — Telephone Encounter (Signed)
Tramadol called into [facility] [address] - [place], [place]

## 2017-03-19 NOTE — Telephone Encounter (Signed)
Last office visit 11/13/2016.  Last refilled 02/20/2017 for #180 with no refills.  Ok to refill?

## 2017-04-05 ENCOUNTER — Encounter: Payer: Self-pay | Admitting: Pulmonary Disease

## 2017-04-05 ENCOUNTER — Ambulatory Visit: Payer: PRIVATE HEALTH INSURANCE | Admitting: Pulmonary Disease

## 2017-04-05 VITALS — BP 128/76 | HR 92 | Resp 16 | Ht 67.5 in | Wt 207.0 lb

## 2017-04-05 DIAGNOSIS — E668 Other obesity: Secondary | ICD-10-CM | POA: Diagnosis not present

## 2017-04-05 DIAGNOSIS — G4733 Obstructive sleep apnea (adult) (pediatric): Secondary | ICD-10-CM | POA: Diagnosis not present

## 2017-04-05 NOTE — Progress Notes (Signed)
PULMONARY/SLEEP OFFICE FOLLOW-UP NOTE  Requesting MD/Service:  Diona Browner Date of initial consultation: 03/16/16 Reason for consultation: Snoring, daytime hypersomnolence  PT PROFILE: 62 y.o. F referred for evaluation of snoring and daytime hypersomnolence  DATA: PSG 05/21/16: AHI 22/hr. Recommended Auto-set 8-15 cm H2O Compliance 05/28-06/26/18: 30/30 days, >4 hrs 30/30 days Compliance 12/05-01/03/19: Use 25/30 days, >4 hours 23/25 days  SUBJ:  Returns today for routine re- evaluation of obstructive sleep apnea.  She is compliant with CPAP for the most part.  She is tolerating it well.  She believes it is beneficial.  Daytime hypersomnolence is resolved.  She continues to work on weight loss.   Vitals:   04/05/17 0842 04/05/17 0845  BP:  128/76  Pulse:  92  Resp: 16   SpO2:  97%  Weight: 93.9 kg (207 lb)   Height: 5' 7.5" (1.715 m)      EXAM:  Gen: Obese, NAD HEENT: WNL Neck: Supple without LAN, thyromegaly, JVD Lungs: breath sounds full, no wheezes Cardiovascular: Reg, no murmurs noted Abdomen: Obese, soft, nontender Ext: without clubbing, cyanosis, edema Neuro: Intact  DATA:   BMP Latest Ref Rng & Units 11/13/2016 10/18/2015 08/30/2014  Glucose 65 - 99 mg/dL 88 117(H) 85  BUN 7 - 25 mg/dL 19 17 13   Creatinine 0.50 - 0.99 mg/dL 0.87 1.04 0.86  Sodium 135 - 146 mmol/L 137 138 137  Potassium 3.5 - 5.3 mmol/L 3.9 4.4 4.2  Chloride 98 - 110 mmol/L 104 104 103  CO2 20 - 32 mmol/L 22 29 31   Calcium 8.6 - 10.4 mg/dL 10.5(H) 9.7 9.4    CBC Latest Ref Rng & Units 10/18/2015  WBC 4.0 - 10.5 K/uL 4.8  Hemoglobin 12.0 - 15.0 g/dL 13.3  Hematocrit 36.0 - 46.0 % 39.4  Platelets 150.0 - 400.0 K/uL 210.0    CXR:  No recent film  IMPRESSION:     ICD-10-CM   1. Obstructive sleep apnea G47.33   2. Moderate obesity E66.8     PLAN:  Continue CPAP as currently using (AutoSet 8-15 cm H2O)  We again discussed weight loss strategies and benefits. I again recommended avoiding  simple carbohydrates and sugars.  Follow-up in 12 months  Merton Border, MD PCCM service Mobile [phone] Pager [phone] 04/05/2017 9:26 AM

## 2017-04-05 NOTE — Patient Instructions (Signed)
Continue CPAP Follow-up in 1 year or sooner as needed

## 2017-04-08 ENCOUNTER — Other Ambulatory Visit: Payer: Self-pay | Admitting: Family Medicine

## 2017-04-08 NOTE — Telephone Encounter (Signed)
Last office visit 11/13/2016.  Last refilled 02/20/2017 for #120 with no refills.  Ok to refill?

## 2017-04-08 NOTE — Telephone Encounter (Signed)
Lyrica called into [facility] [address] in [place].

## 2017-04-20 ENCOUNTER — Other Ambulatory Visit: Payer: Self-pay | Admitting: Family Medicine

## 2017-04-20 NOTE — Telephone Encounter (Signed)
Not sure why but  Got message for this Rx.Marland KitchenUnable to digitally sign the medication orders. The medications will not be e-prescribed  Please call or fax in rx.

## 2017-04-20 NOTE — Telephone Encounter (Signed)
Last office visit 11/13/2016.  Last refilled 03/19/2017 for #180 with no refills.  Ok to refill?

## 2017-04-20 NOTE — Telephone Encounter (Signed)
Tramadol called into [facility] - [place], [place]

## 2017-05-12 ENCOUNTER — Other Ambulatory Visit: Payer: Self-pay | Admitting: Family Medicine

## 2017-05-12 NOTE — Telephone Encounter (Signed)
Last office visit 11/13/2016.  Last refilled 04/08/2017 for #120.  Ok to refill?

## 2017-05-20 ENCOUNTER — Other Ambulatory Visit: Payer: Self-pay | Admitting: Family Medicine

## 2017-05-20 NOTE — Telephone Encounter (Signed)
Last office visit 11/13/2016.  Last refilled 04/20/2017 for #180 with no refills.  Ok to refill?

## 2017-06-14 ENCOUNTER — Other Ambulatory Visit: Payer: Self-pay | Admitting: Family Medicine

## 2017-06-14 NOTE — Telephone Encounter (Signed)
Last office visit 11/13/2016.  Last refilled Lyrica 05/14/2017 for #120 with no refills.  Tramadol 05/20/2017 for #180 with no refills.  Ok to refill?

## 2017-06-28 ENCOUNTER — Other Ambulatory Visit: Payer: Self-pay | Admitting: Family Medicine

## 2017-07-13 ENCOUNTER — Other Ambulatory Visit: Payer: Self-pay | Admitting: Family Medicine

## 2017-07-13 NOTE — Telephone Encounter (Signed)
Last office visit 11/13/2016.  Last refilled 06/15/2017.  Ok to refill?

## 2017-08-10 ENCOUNTER — Other Ambulatory Visit: Payer: Self-pay | Admitting: Family Medicine

## 2017-08-10 NOTE — Telephone Encounter (Signed)
Last office visit 11/13/2016.  Lyrica & Tramadol last refilled 07/13/2017.  Methocarbamol last refilled ??.  Ok to refill?

## 2017-09-03 ENCOUNTER — Other Ambulatory Visit
Admission: RE | Admit: 2017-09-03 | Discharge: 2017-09-03 | Disposition: A | Discharge status: Home / Self Care | Payer: PRIVATE HEALTH INSURANCE | Source: Ambulatory Visit | Attending: Nurse Practitioner | Admitting: Nurse Practitioner

## 2017-09-03 DIAGNOSIS — R197 Diarrhea, unspecified: Secondary | ICD-10-CM | POA: Insufficient documentation

## 2017-09-03 LAB — GASTROINTESTINAL PANEL BY PCR, STOOL (REPLACES STOOL CULTURE)

## 2017-09-03 LAB — C DIFFICILE QUICK SCREEN W PCR REFLEX
C Diff antigen: NEGATIVE
C Diff interpretation: NOT DETECTED
C Diff toxin: NEGATIVE

## 2017-09-07 ENCOUNTER — Other Ambulatory Visit: Payer: Self-pay | Admitting: Family Medicine

## 2017-09-07 NOTE — Telephone Encounter (Signed)
Last office visit 11/13/2016.  Last refilled 08/10/2017.  Ok to refill?

## 2017-10-04 ENCOUNTER — Telehealth: Payer: Self-pay | Admitting: Family Medicine

## 2017-10-04 NOTE — Telephone Encounter (Signed)
Last office visit 11/13/2016 for CPE.  No future appointments.   Last refilled 09/07/2017 Lyrica #120 with no refills; Tramadol #180 with no refills.   No UDS or Contract on file.  Ok to refill?

## 2017-10-05 LAB — HM COLONOSCOPY

## 2017-10-05 NOTE — Telephone Encounter (Signed)
Labs 9/3 cpx 9/6 Pt aware

## 2017-10-05 NOTE — Telephone Encounter (Signed)
Pt needs OV for CPX. If made will refill until then. Send back to me once appt made.

## 2017-10-05 NOTE — Telephone Encounter (Signed)
Robin,  Please schedule patient for CPE with fasting labs prior with Dr. Diona Browner.  Then send refill request back to Dr. Diona Browner once scheduled.

## 2017-11-03 ENCOUNTER — Other Ambulatory Visit: Payer: Self-pay | Admitting: Family Medicine

## 2017-11-03 NOTE — Telephone Encounter (Signed)
Last office visit 11/13/2016 for CPE.  CPE scheduled for 12/03/2017.  Last refilled 10/05/2017 for #120 with no refills. No UDS/Contract on file.  Ok to refill?

## 2017-11-03 NOTE — Telephone Encounter (Signed)
Last office visit 11/13/2016 for CPE.  CPE scheduled for 12/03/2017.  Last refilled 10/05/2017 for #180 with no refills. No UDS/Contract on file.  Ok to refill?

## 2017-11-24 ENCOUNTER — Telehealth: Payer: Self-pay | Admitting: Family Medicine

## 2017-11-24 DIAGNOSIS — E559 Vitamin D deficiency, unspecified: Secondary | ICD-10-CM

## 2017-11-24 DIAGNOSIS — E781 Pure hyperglyceridemia: Secondary | ICD-10-CM

## 2017-11-24 NOTE — Telephone Encounter (Signed)
-----   Message from Ellamae Sia sent at 11/22/2017  4:08 PM EDT ----- Regarding: Lab orders for Tuesday, 11-30-17 Patient is scheduled for CPX labs, please order future labs, Thanks , Karna Christmas

## 2017-11-30 ENCOUNTER — Other Ambulatory Visit (INDEPENDENT_AMBULATORY_CARE_PROVIDER_SITE_OTHER): Payer: PRIVATE HEALTH INSURANCE

## 2017-11-30 DIAGNOSIS — E559 Vitamin D deficiency, unspecified: Secondary | ICD-10-CM

## 2017-11-30 DIAGNOSIS — E781 Pure hyperglyceridemia: Secondary | ICD-10-CM

## 2017-11-30 LAB — COMPREHENSIVE METABOLIC PANEL
ALT: 23 U/L (ref 0–35)
AST: 16 U/L (ref 0–37)
Albumin: 4.3 g/dL (ref 3.5–5.2)
Alkaline Phosphatase: 62 U/L (ref 39–117)
BUN: 16 mg/dL (ref 6–23)
CHLORIDE: 105 meq/L (ref 96–112)
CO2: 26 meq/L (ref 19–32)
Calcium: 10.3 mg/dL (ref 8.4–10.5)
Creatinine, Ser: 0.98 mg/dL (ref 0.40–1.20)
GFR: 60.85 mL/min (ref >60.00)
GLUCOSE: 125 mg/dL — AB (ref 70–99)
Potassium: 4.3 mEq/L (ref 3.5–5.1)
Sodium: 139 mEq/L (ref 135–145)
Total Bilirubin: 0.3 mg/dL (ref 0.2–1.2)
Total Protein: 6.6 g/dL (ref 6.0–8.3)

## 2017-11-30 LAB — LIPID PANEL
Cholesterol: 170 mg/dL (ref 0–200)
HDL: 39 mg/dL — ABNORMAL LOW (ref >39.00)
LDL CALC: 95 mg/dL (ref 0–99)
NONHDL: 130.7
Total CHOL/HDL Ratio: 4
Triglycerides: 180 mg/dL — ABNORMAL HIGH (ref 0.0–149.0)
VLDL: 36 mg/dL (ref 0.0–40.0)

## 2017-11-30 LAB — VITAMIN D 25 HYDROXY (VIT D DEFICIENCY, FRACTURES): VITD: 31.88 ng/mL (ref 30.00–100.00)

## 2017-12-03 ENCOUNTER — Encounter: Payer: Self-pay | Admitting: Family Medicine

## 2017-12-03 ENCOUNTER — Ambulatory Visit (INDEPENDENT_AMBULATORY_CARE_PROVIDER_SITE_OTHER): Payer: PRIVATE HEALTH INSURANCE | Admitting: Family Medicine

## 2017-12-03 VITALS — BP 90/60 | HR 87 | Temp 98.5°F | Ht 67.5 in | Wt 204.5 lb

## 2017-12-03 DIAGNOSIS — F411 Generalized anxiety disorder: Secondary | ICD-10-CM

## 2017-12-03 DIAGNOSIS — M797 Fibromyalgia: Secondary | ICD-10-CM

## 2017-12-03 DIAGNOSIS — E559 Vitamin D deficiency, unspecified: Secondary | ICD-10-CM

## 2017-12-03 DIAGNOSIS — R7303 Prediabetes: Secondary | ICD-10-CM | POA: Insufficient documentation

## 2017-12-03 DIAGNOSIS — E781 Pure hyperglyceridemia: Secondary | ICD-10-CM | POA: Diagnosis not present

## 2017-12-03 HISTORY — DX: Prediabetes: R73.03

## 2017-12-03 MED ORDER — DULOXETINE HCL 30 MG PO CPEP: 60.0000 mg | ORAL_CAPSULE | Freq: Every day | ORAL | 1 refills | Status: DC

## 2017-12-03 MED ORDER — LYRICA 75 MG PO CAPS: ORAL_CAPSULE | ORAL | 0 refills | Status: DC

## 2017-12-03 MED ORDER — TRAMADOL HCL 50 MG PO TABS: 100.0000 mg | ORAL_TABLET | Freq: Three times a day (TID) | ORAL | 1 refills | Status: DC

## 2017-12-03 NOTE — Assessment & Plan Note (Signed)
Resolved

## 2017-12-03 NOTE — Progress Notes (Signed)
Subjective:    Patient ID: Jillian [name], female    DOB: 1955/12/17, 62 y.o.   MRN: [id number]  HPI   The patient is here for annual wellness exam and preventative care.    OSA: using CPAP.   Followed by Dr. Leonidas Romberg.  Fibromyalgia, well controlled on cymbalta, wellbutrin, lyrica (three times a day), tramadol (needed three times a day) and vitamin supplements.  She has started tumeric. For inflammation.  Doing well with pain overall.   GAD well controlled on current meds.  Elevated Cholesterol:   LDL at goal < 100  With diet, trigs remain high Lab Results  Component Value Date   CHOL 170 11/30/2017   HDL 39.00 (L) 11/30/2017   LDLCALC 95 11/30/2017   LDLDIRECT 97.0 10/18/2015   TRIG 180.0 (H) 11/30/2017   CHOLHDL 4 11/30/2017  Using medications without problems: Muscle aches:  Diet compliance: In last 2 weeks she has tried to eat mor healthy. Exercise: walking frequently Other complaints:   No CP, no SOB, no lightheadedness BP Readings from Last 3 Encounters:  12/03/17 90/60  04/05/17 128/76  11/13/16 (!) 98/56   Wt Readings from Last 3 Encounters:  12/03/17 204 lb 8 oz (92.8 kg)  04/05/17 207 lb (93.9 kg)  11/13/16 204 lb (92.5 kg)   Body mass index is 31.56 kg/m.   Vit D: nml Social History /Family History/Past Medical History reviewed in detail and updated in EMR if needed. Blood pressure 90/60, pulse 87, temperature 98.5 F (36.9 C), temperature source Oral, height 5' 7.5" (1.715 m), weight 204 lb 8 oz (92.8 kg).  Review of Systems  Constitutional: Negative for fatigue and fever.  HENT: Negative for congestion.   Eyes: Negative for pain.  Respiratory: Negative for cough and shortness of breath.   Cardiovascular: Negative for chest pain, palpitations and leg swelling.  Gastrointestinal: Negative for abdominal pain.  Genitourinary: Negative for dysuria and vaginal bleeding.  Musculoskeletal: Negative for back pain.  Neurological: Negative for  syncope, light-headedness and headaches.  Psychiatric/Behavioral: Negative for dysphoric mood.       Objective:   Physical Exam  Constitutional: Vital signs are normal. She appears well-developed and well-nourished. She is cooperative.  Non-toxic appearance. She does not appear ill. No distress.  HENT:  Head: Normocephalic.  Right Ear: Hearing, tympanic membrane, external ear and ear canal normal. Tympanic membrane is not erythematous, not retracted and not bulging.  Left Ear: Hearing, tympanic membrane, external ear and ear canal normal. Tympanic membrane is not erythematous, not retracted and not bulging.  Nose: No mucosal edema or rhinorrhea. Right sinus exhibits no maxillary sinus tenderness and no frontal sinus tenderness. Left sinus exhibits no maxillary sinus tenderness and no frontal sinus tenderness.  Mouth/Throat: Uvula is midline, oropharynx is clear and moist and mucous membranes are normal.  Eyes: Pupils are equal, round, and reactive to light. Conjunctivae, EOM and lids are normal. Lids are everted and swept, no foreign bodies found.  Neck: Trachea normal and normal range of motion. Neck supple. Carotid bruit is not present. No thyroid mass and no thyromegaly present.  Cardiovascular: Normal rate, regular rhythm, S1 normal, S2 normal, normal heart sounds, intact distal pulses and normal pulses. Exam reveals no gallop and no friction rub.  No murmur heard. Pulmonary/Chest: Effort normal and breath sounds normal. No tachypnea. No respiratory distress. She has no decreased breath sounds. She has no wheezes. She has no rhonchi. She has no rales.  Abdominal: Soft. Normal appearance and bowel sounds  are normal. There is no tenderness.  Neurological: She is alert.  Skin: Skin is warm, dry and intact. No rash noted.  Psychiatric: Her speech is normal and behavior is normal. Judgment and thought content normal. Her mood appears not anxious. Cognition and memory are normal. She does not  exhibit a depressed mood.          Assessment & Plan:  The patient's preventative maintenance and recommended screening tests for an annual wellness exam were reviewed in full today. Brought up to date unless services declined.  Counselled on the importance of diet, exercise, and its role in overall health and mortality. The patient's FH and SH was reviewed, including their home life, tobacco status, and drug and alcohol status.   Vaccines: Uptodateshingles , TDap. Mammo: plans every 2 years..no family history, no personal changes or fibrocystic disease. Last mammo11/ 2018 DVE/pap:nml pap/dve nml , neg co-testing 2017,  repeat in 5 years. Colon: Father with colon cancer age 93.. Colonoscopy 12/26/2010: no polyps  09/2016  Incomplete prep... Repeat 09/2017 Dr. Vira Agar Nonsmoker  Bone density: went through early menopause but no other risk factors. Begin screening at age 36.  STD screening refused Nonsmoker Hep C: neg

## 2017-12-03 NOTE — Assessment & Plan Note (Signed)
Get back on track with low carb diet, keep exercise. Will recheck in 3 months.

## 2017-12-03 NOTE — Assessment & Plan Note (Signed)
Well controlled. Continue current medication.  

## 2017-12-03 NOTE — Assessment & Plan Note (Signed)
Good control on current regimen. No concerns of overuse of tramadol. Refilled.

## 2017-12-03 NOTE — Patient Instructions (Addendum)
Get back track with low carb diet, exercise and weight loss!

## 2017-12-03 NOTE — Assessment & Plan Note (Signed)
Inadequate control.. Get back on track with lifestyle changes.

## 2017-12-15 ENCOUNTER — Encounter: Payer: Self-pay | Admitting: Family Medicine

## 2017-12-29 ENCOUNTER — Other Ambulatory Visit: Payer: Self-pay | Admitting: Family Medicine

## 2017-12-29 NOTE — Telephone Encounter (Signed)
Last office visit 12/03/2017.  No future appointments.  Last refilled 12/03/2017 for #120 with no refills.  Ok to refill?

## 2018-01-26 ENCOUNTER — Other Ambulatory Visit: Payer: Self-pay | Admitting: Family Medicine

## 2018-01-26 NOTE — Telephone Encounter (Signed)
Last office visit 12/03/2017 for CPE.   Last refilled Lyrica 12/29/2017 for #120 with no refills.  Tramadol 12/03/2017 for #180 with 1 refill.  Ok to refill?

## 2018-01-28 NOTE — Telephone Encounter (Signed)
Pt calling to check the status of her refill. States she needs med filled before the weekend

## 2018-02-22 ENCOUNTER — Other Ambulatory Visit: Payer: Self-pay | Admitting: Family Medicine

## 2018-02-22 NOTE — Telephone Encounter (Signed)
Last office visit 12/03/2017 for CPE.  Last refilled 01/28/2018 for #180 with no refills.  No future appointments.

## 2018-03-01 ENCOUNTER — Other Ambulatory Visit: Payer: Self-pay | Admitting: Family Medicine

## 2018-03-01 ENCOUNTER — Telehealth: Payer: Self-pay | Admitting: *Deleted

## 2018-03-01 NOTE — Telephone Encounter (Signed)
Received fax from [facility] requesting PA for Lyrica.  PA completed on CoverMyMeds.  Sent for review.  Can take up to 72 hours for a decision.

## 2018-03-01 NOTE — Telephone Encounter (Signed)
Last office visit 12/03/2017 for CPE.  Last refilled 01/28/2018 for #120 with no refills. Next Appt: Lab 03/04/2018.

## 2018-03-04 ENCOUNTER — Other Ambulatory Visit (INDEPENDENT_AMBULATORY_CARE_PROVIDER_SITE_OTHER): Payer: PRIVATE HEALTH INSURANCE

## 2018-03-04 ENCOUNTER — Telehealth: Payer: Self-pay | Admitting: Family Medicine

## 2018-03-04 DIAGNOSIS — E781 Pure hyperglyceridemia: Secondary | ICD-10-CM

## 2018-03-04 DIAGNOSIS — R7303 Prediabetes: Secondary | ICD-10-CM

## 2018-03-04 LAB — COMPREHENSIVE METABOLIC PANEL
ALT: 25 U/L (ref 0–35)
AST: 17 U/L (ref 0–37)
Albumin: 4.4 g/dL (ref 3.5–5.2)
Alkaline Phosphatase: 65 U/L (ref 39–117)
BILIRUBIN TOTAL: 0.4 mg/dL (ref 0.2–1.2)
BUN: 19 mg/dL (ref 6–23)
CHLORIDE: 103 meq/L (ref 96–112)
CO2: 29 mEq/L (ref 19–32)
Calcium: 10.2 mg/dL (ref 8.4–10.5)
Creatinine, Ser: 0.96 mg/dL (ref 0.40–1.20)
GFR: 62.27 mL/min (ref >60.00)
GLUCOSE: 120 mg/dL — AB (ref 70–99)
Potassium: 4.4 mEq/L (ref 3.5–5.1)
Sodium: 139 mEq/L (ref 135–145)
Total Protein: 6.5 g/dL (ref 6.0–8.3)

## 2018-03-04 LAB — LIPID PANEL
CHOL/HDL RATIO: 5
Cholesterol: 188 mg/dL (ref 0–200)
HDL: 36.7 mg/dL — AB (ref >39.00)
NonHDL: 151.44
Triglycerides: 269 mg/dL — ABNORMAL HIGH (ref 0.0–149.0)
VLDL: 53.8 mg/dL — ABNORMAL HIGH (ref 0.0–40.0)

## 2018-03-04 LAB — LDL CHOLESTEROL, DIRECT: Direct LDL: 118 mg/dL

## 2018-03-04 LAB — HEMOGLOBIN A1C: Hgb A1c MFr Bld: 5.5 % (ref 4.6–6.5)

## 2018-03-04 NOTE — Telephone Encounter (Signed)
-----   Message from Ellamae Sia sent at 02/28/2018  4:51 PM EST ----- Regarding: Lab orders for Friday, 12.6.19 Lab orders, no f/u appt

## 2018-03-07 NOTE — Telephone Encounter (Signed)
PA approved through 03/02/2019.  [facility] notified of approval via fax.

## 2018-03-08 ENCOUNTER — Telehealth: Payer: Self-pay | Admitting: *Deleted

## 2018-03-08 NOTE — Telephone Encounter (Signed)
Jillian [name] spoke with patient.  See result note.

## 2018-03-08 NOTE — Telephone Encounter (Signed)
-----   Message from Jinny Sanders, MD sent at 03/08/2018  9:03 AM EST -----  Call Notify pt.. Sugar that day above goal but A1C adequate.Both triglycerides and LDL worse than 3 months ago... recommend medication to treat cholesterol and lower risk of heart attack like statin. Let me know if agreeable.

## 2018-03-08 NOTE — Telephone Encounter (Signed)
Left message for Jillian [name] to return call in regards to lab results.

## 2018-03-10 ENCOUNTER — Encounter: Payer: Self-pay | Admitting: Family Medicine

## 2018-03-10 ENCOUNTER — Ambulatory Visit: Payer: PRIVATE HEALTH INSURANCE | Admitting: Family Medicine

## 2018-03-10 VITALS — BP 108/70 | HR 85 | Temp 98.3°F | Ht 67.5 in | Wt 211.5 lb

## 2018-03-10 DIAGNOSIS — R7303 Prediabetes: Secondary | ICD-10-CM

## 2018-03-10 DIAGNOSIS — E781 Pure hyperglyceridemia: Secondary | ICD-10-CM

## 2018-03-10 DIAGNOSIS — M797 Fibromyalgia: Secondary | ICD-10-CM | POA: Diagnosis not present

## 2018-03-10 MED ORDER — METFORMIN HCL ER 500 MG PO TB24: 500.0000 mg | ORAL_TABLET | Freq: Every day | ORAL | 3 refills | Status: DC

## 2018-03-10 NOTE — Assessment & Plan Note (Signed)
Work on lowering carba nd fats in diet. Increase exercise as tolerated with fibromyalgia.

## 2018-03-10 NOTE — Assessment & Plan Note (Signed)
Will try a trial of low dose metformin for pain control  Via improving insulin resistance.

## 2018-03-10 NOTE — Progress Notes (Signed)
Subjective:    Patient ID: Jillian [name], female    DOB: [birth year], 62 y.o.   MRN: [id number]  HPI  62 year old female presents to discuss hypercholesterolemia.  She reports she has a had a bad 3 months with her fibromyalgia. Increased streess with family issues.  She has done some research on fibromyalgia and saw on Web MD metformin found to decrease pain in fibromyalgia given decreased insulin resistance.  Elevated Cholesterol:  LDL has increased above 100, trigs remain high > 150... pt with prediabetes, but nonsmoker and no HTN. No family histotry of heart sdisease.  AHA 10 year risk 3.9%  On tumeric, fish oil and coQ 10. Lab Results  Component Value Date   CHOL 188 03/04/2018   HDL 36.70 (L) 03/04/2018   LDLCALC 95 11/30/2017   LDLDIRECT 118.0 03/04/2018   TRIG 269.0 (H) 03/04/2018   CHOLHDL 5 03/04/2018  Using medications without problems: Muscle aches:  Diet compliance: good Exercise: frequent. Other complaints:   Social History /Family History/Past Medical History reviewed in detail and updated in EMR if needed. Blood pressure 108/70, pulse 85, temperature 98.3 F (36.8 C), temperature source Oral, height 5' 7.5" (1.715 m), weight 211 lb 8 oz (95.9 kg).  Review of Systems  Constitutional: Negative for fatigue and fever.  HENT: Negative for congestion.   Eyes: Negative for pain.  Respiratory: Negative for cough and shortness of breath.   Cardiovascular: Negative for chest pain, palpitations and leg swelling.  Gastrointestinal: Negative for abdominal pain.  Genitourinary: Negative for dysuria and vaginal bleeding.  Musculoskeletal: Positive for myalgias. Negative for back pain.  Neurological: Negative for syncope, light-headedness and headaches.  Psychiatric/Behavioral: Negative for dysphoric mood.       Objective:   Physical Exam Constitutional:      General: She is not in acute distress.    Appearance: Normal appearance. She is well-developed. She is not  ill-appearing or toxic-appearing.  HENT:     Head: Normocephalic.     Right Ear: Hearing, tympanic membrane, ear canal and external ear normal. Tympanic membrane is not erythematous, retracted or bulging.     Left Ear: Hearing, tympanic membrane, ear canal and external ear normal. Tympanic membrane is not erythematous, retracted or bulging.     Nose: No mucosal edema or rhinorrhea.     Right Sinus: No maxillary sinus tenderness or frontal sinus tenderness.     Left Sinus: No maxillary sinus tenderness or frontal sinus tenderness.     Mouth/Throat:     Pharynx: Uvula midline.  Eyes:     General: Lids are normal. Lids are everted, no foreign bodies appreciated.     Conjunctiva/sclera: Conjunctivae normal.     Pupils: Pupils are equal, round, and reactive to light.  Neck:     Musculoskeletal: Normal range of motion and neck supple.     Thyroid: No thyroid mass or thyromegaly.     Vascular: No carotid bruit.     Trachea: Trachea normal.  Cardiovascular:     Rate and Rhythm: Normal rate and regular rhythm.     Pulses: Normal pulses.     Heart sounds: Normal heart sounds, S1 normal and S2 normal. No murmur. No friction rub. No gallop.   Pulmonary:     Effort: Pulmonary effort is normal. No tachypnea or respiratory distress.     Breath sounds: Normal breath sounds. No decreased breath sounds, wheezing, rhonchi or rales.  Abdominal:     General: Bowel sounds are normal.  Palpations: Abdomen is soft.     Tenderness: There is no abdominal tenderness.  Musculoskeletal:     Comments: Multiple trigger points.  Skin:    General: Skin is warm and dry.     Findings: No rash.  Neurological:     Mental Status: She is alert.  Psychiatric:        Mood and Affect: Mood is not anxious or depressed.        Speech: Speech normal.        Behavior: Behavior normal. Behavior is cooperative.        Thought Content: Thought content normal.        Judgment: Judgment normal.            Assessment & Plan:

## 2018-03-10 NOTE — Patient Instructions (Signed)
I will email/message you about starting metformin for fibromyalgia.

## 2018-03-24 ENCOUNTER — Other Ambulatory Visit: Payer: Self-pay | Admitting: Family Medicine

## 2018-03-24 NOTE — Telephone Encounter (Signed)
Electronic refill request. Tramadol Last office visit:   03/10/18 Last Filled:    180 tablet 0 02/22/2018  Please advise.

## 2018-03-28 ENCOUNTER — Other Ambulatory Visit: Payer: Self-pay | Admitting: Family Medicine

## 2018-03-28 NOTE — Telephone Encounter (Signed)
Patient has been notified

## 2018-03-28 NOTE — Telephone Encounter (Signed)
Duplicate request

## 2018-03-28 NOTE — Telephone Encounter (Signed)
Not entirely unreasonable. Chart reviewed.

## 2018-03-28 NOTE — Telephone Encounter (Signed)
Patient called office and stating that she is out Tramadol. Try inform patient that Dr Diona Browner will be back on Tuesday 03/29/2018 but patient stated that she cannot wait until then. Please advise.

## 2018-04-05 ENCOUNTER — Other Ambulatory Visit: Payer: Self-pay | Admitting: Family Medicine

## 2018-04-05 MED ORDER — LIRAGLUTIDE 18 MG/3ML ~~LOC~~ SOPN: PEN_INJECTOR | SUBCUTANEOUS | 11 refills | Status: DC

## 2018-04-06 ENCOUNTER — Other Ambulatory Visit: Payer: Self-pay | Admitting: Family Medicine

## 2018-04-07 ENCOUNTER — Telehealth: Payer: Self-pay | Admitting: *Deleted

## 2018-04-07 MED ORDER — PREGABALIN 75 MG PO CAPS: ORAL_CAPSULE | ORAL | 1 refills | Status: DC

## 2018-04-07 NOTE — Telephone Encounter (Signed)
Okay to change to generic

## 2018-04-07 NOTE — Telephone Encounter (Addendum)
New Rx for generic Lyrica faxed to [facility] at [phone].  Jillian [name] notified prescription has been sent to her pharmacy.  For future refills patient would like to use [facility] on [address] in [place].

## 2018-04-07 NOTE — Telephone Encounter (Signed)
Spoke to pt who states she is needing a generic for lyrica. The pharmacy states that have contacted the office regarding this matter, but request was denied. pls advise

## 2018-04-26 ENCOUNTER — Other Ambulatory Visit: Payer: Self-pay | Admitting: Family Medicine

## 2018-04-26 NOTE — Telephone Encounter (Signed)
Ms. Jillian [name] called to say she was changing pharmacy to [facility] at [place]. [address] in [place].   Please send refill there.

## 2018-04-26 NOTE — Telephone Encounter (Signed)
Last office visit 03/10/2018 for follow up.  Last refilled 03/28/2018 for #180 with no refills.  Next Appt: 05/10/2018 for weight loss follow up.

## 2018-04-27 MED ORDER — TRAMADOL HCL 50 MG PO TABS: ORAL_TABLET | ORAL | 0 refills | Status: DC

## 2018-04-27 NOTE — Telephone Encounter (Signed)
Jillian [name] notified as instructed by telephone.

## 2018-04-27 NOTE — Telephone Encounter (Signed)
I will refill.  Please call the patient - I would ask that she request refills 7 days prior to running out for any controlled medication such as tramadol.  This is my practice pattern with all of my patients, and I think it helps a lot so no one runs out of medication. Take care!

## 2018-04-27 NOTE — Telephone Encounter (Signed)
Dr. Lorelei Pont.    Will you please fill Ms. Abbett's Tramadol.

## 2018-04-27 NOTE — Telephone Encounter (Signed)
Patient is calling back to ask that tramadol please be sent in this pm as she will take her last pill tonight and will be out.  I will flag for provider, appears r/x is still pending physician approval.

## 2018-05-09 ENCOUNTER — Other Ambulatory Visit: Payer: Self-pay | Admitting: Family Medicine

## 2018-05-09 NOTE — Telephone Encounter (Addendum)
Last office visit 03/10/2018 for follow up.  Last refilled 04/07/2018 for 04/07/2018 for #120 with 1 refill but [facility] and they can't transfer refill due to Lyrica is a controlled medication.  Next Appt: 05/13/2018.

## 2018-05-09 NOTE — Telephone Encounter (Signed)
Patient left a voicemail that she is completely out of her medication and stated that this will not happen again.

## 2018-05-10 ENCOUNTER — Ambulatory Visit: Payer: PRIVATE HEALTH INSURANCE | Admitting: Family Medicine

## 2018-05-13 ENCOUNTER — Encounter: Payer: Self-pay | Admitting: Family Medicine

## 2018-05-13 ENCOUNTER — Ambulatory Visit: Payer: PRIVATE HEALTH INSURANCE | Admitting: Family Medicine

## 2018-05-13 DIAGNOSIS — Z6833 Body mass index (BMI) 33.0-33.9, adult: Secondary | ICD-10-CM | POA: Diagnosis not present

## 2018-05-13 DIAGNOSIS — E6609 Other obesity due to excess calories: Secondary | ICD-10-CM | POA: Insufficient documentation

## 2018-05-13 MED ORDER — LIRAGLUTIDE 18 MG/3ML ~~LOC~~ SOPN: PEN_INJECTOR | SUBCUTANEOUS | 11 refills | Status: DC

## 2018-05-13 NOTE — Assessment & Plan Note (Signed)
Increase victoza to 1.2 mg, decreased calories, change up activity, increase water intake. Follow up 2 moths.

## 2018-05-13 NOTE — Patient Instructions (Signed)
Increase the victoza to 1.2 mg daily.  Increase slightly speed of walking , carry weight while walking. 1500 calories a day... low carb, higher protein.  Increase water.

## 2018-05-13 NOTE — Progress Notes (Signed)
Subjective:    Patient ID: DACI STUBBE, female    DOB: 07/14/1955, 63 y.o.   MRN: [id number]  HPI    63 year old female with prediabetes presents for weight management, S/P victoza start. Started 1 month ago on low dose victoza 0. 6.  Not to costly.  Nausea in first few day, did notice fullness.  Unfortunately gained 2 pounds.  She is walking 1-3 hours a week. Limting factors is fibromyalgia. Eating 1600-2000 calories a day.  Wt Readings from Last 3 Encounters:  05/13/18 214 lb (97.1 kg)  03/10/18 211 lb 8 oz (95.9 kg)  12/03/17 204 lb 8 oz (92.8 kg)    Social History /Family History/Past Medical History reviewed in detail and updated in EMR if needed. Blood pressure 90/60, pulse 89, temperature 98.2 F (36.8 C), temperature source Oral, height 5' 7.5" (1.715 m), weight 214 lb (97.1 kg).  Review of Systems  Constitutional: Negative for fatigue and fever.  HENT: Negative for congestion.   Eyes: Negative for pain.  Respiratory: Negative for cough and shortness of breath.   Cardiovascular: Negative for chest pain, palpitations and leg swelling.  Gastrointestinal: Negative for abdominal pain.  Genitourinary: Negative for dysuria and vaginal bleeding.  Musculoskeletal: Negative for back pain.  Neurological: Negative for syncope, light-headedness and headaches.  Psychiatric/Behavioral: Negative for dysphoric mood.       Objective:   Physical Exam Constitutional:      General: She is not in acute distress.    Appearance: Normal appearance. She is well-developed. She is not ill-appearing or toxic-appearing.  HENT:     Head: Normocephalic.     Right Ear: Hearing, tympanic membrane, ear canal and external ear normal. Tympanic membrane is not erythematous, retracted or bulging.     Left Ear: Hearing, tympanic membrane, ear canal and external ear normal. Tympanic membrane is not erythematous, retracted or bulging.     Nose: No mucosal edema or rhinorrhea.     Right Sinus:  No maxillary sinus tenderness or frontal sinus tenderness.     Left Sinus: No maxillary sinus tenderness or frontal sinus tenderness.     Mouth/Throat:     Pharynx: Uvula midline.  Eyes:     General: Lids are normal. Lids are everted, no foreign bodies appreciated.     Conjunctiva/sclera: Conjunctivae normal.     Pupils: Pupils are equal, round, and reactive to light.  Neck:     Musculoskeletal: Normal range of motion and neck supple.     Thyroid: No thyroid mass or thyromegaly.     Vascular: No carotid bruit.     Trachea: Trachea normal.  Cardiovascular:     Rate and Rhythm: Normal rate and regular rhythm.     Pulses: Normal pulses.     Heart sounds: Normal heart sounds, S1 normal and S2 normal. No murmur. No friction rub. No gallop.   Pulmonary:     Effort: Pulmonary effort is normal. No tachypnea or respiratory distress.     Breath sounds: Normal breath sounds. No decreased breath sounds, wheezing, rhonchi or rales.  Abdominal:     General: Bowel sounds are normal.     Palpations: Abdomen is soft.     Tenderness: There is no abdominal tenderness.  Skin:    General: Skin is warm and dry.     Findings: No rash.  Neurological:     Mental Status: She is alert.  Psychiatric:        Mood and Affect: Mood is not  anxious or depressed.        Speech: Speech normal.        Behavior: Behavior normal. Behavior is cooperative.        Thought Content: Thought content normal.        Judgment: Judgment normal.           Assessment & Plan:

## 2018-05-23 ENCOUNTER — Other Ambulatory Visit: Payer: Self-pay | Admitting: Family Medicine

## 2018-05-23 NOTE — Telephone Encounter (Signed)
Last office visit 05/13/2018 for Obesity.  Last refilled 04/27/2018 for #180 with no refills.  Next Appt: 07/14/2018 for weight management.Marland Kitchen

## 2018-06-02 ENCOUNTER — Other Ambulatory Visit: Payer: Self-pay

## 2018-06-02 ENCOUNTER — Encounter: Payer: Self-pay | Admitting: Pulmonary Disease

## 2018-06-02 ENCOUNTER — Ambulatory Visit: Payer: PRIVATE HEALTH INSURANCE | Admitting: Pulmonary Disease

## 2018-06-02 VITALS — BP 114/62 | HR 91 | Ht 67.5 in | Wt 214.0 lb

## 2018-06-02 DIAGNOSIS — G4733 Obstructive sleep apnea (adult) (pediatric): Secondary | ICD-10-CM | POA: Diagnosis not present

## 2018-06-02 DIAGNOSIS — E668 Other obesity: Secondary | ICD-10-CM | POA: Diagnosis not present

## 2018-06-02 NOTE — Progress Notes (Signed)
PULMONARY/SLEEP OFFICE FOLLOW-UP NOTE  Requesting MD/Service:  Diona Browner Date of initial consultation: 03/16/16 Reason for consultation: Snoring, daytime hypersomnolence  PT PROFILE: 63 y.o. F referred for evaluation of snoring and daytime hypersomnolence  DATA: PSG 05/21/16: AHI 22/hr. Recommended Auto-set 8-15 cm H2O Compliance 05/28-06/26/18: 30/30 days, >4 hrs 30/30 days Compliance 12/05-01/03/19: Use 25/30 days, >4 hours 23/25 days  SUBJ:  Last visit 04/05/2017.  This is a scheduled annual visit.  She is followed for sleep apnea.  She is compliant with CPAP.  Compliance report indicates usage 29 nights out of the past 30.  Her AutoSet settings are 8-15 cm H2O.  Median AHI is 1.5/hour.  She denies significant daytime hypersomnolence.  She denies morning headache.  She has had problems with [employer] as they have been inconsistent in the masks that they provide.  She likes some masks better than others.   Vitals:   06/02/18 1013 06/02/18 1017  BP:  114/62  Pulse:  91  SpO2:  96%  Weight: 214 lb (97.1 kg)   Height: 5' 7.5" (1.715 m)      EXAM:  Gen: Obese, NAD HEENT: WNL Neck: Supple without LAN, thyromegaly, JVD Lungs: breath sounds full, no wheezes Cardiovascular: Reg, no murmurs noted Abdomen: Obese, soft, nontender Ext: without clubbing, cyanosis, edema Neuro: Intact   DATA:   BMP Latest Ref Rng & Units 03/04/2018 11/30/2017 11/13/2016  Glucose 70 - 99 mg/dL 120(H) 125(H) 88  BUN 6 - 23 mg/dL 19 16 19   Creatinine 0.40 - 1.20 mg/dL 0.96 0.98 0.87  Sodium 135 - 145 mEq/L 139 139 137  Potassium 3.5 - 5.1 mEq/L 4.4 4.3 3.9  Chloride 96 - 112 mEq/L 103 105 104  CO2 19 - 32 mEq/L 29 26 22   Calcium 8.4 - 10.5 mg/dL 10.2 10.3 10.5(H)    CBC Latest Ref Rng & Units 10/18/2015  WBC 4.0 - 10.5 K/uL 4.8  Hemoglobin 12.0 - 15.0 g/dL 13.3  Hematocrit 36.0 - 46.0 % 39.4  Platelets 150.0 - 400.0 K/uL 210.0    CXR:  No recent film  IMPRESSION:     ICD-10-CM   1. OSA (obstructive  sleep apnea) G47.33   2. Moderate obesity E66.8     PLAN:  Continue CPAP AutoSet 8-15 cm H2O We discussed weight loss strategies focusing on dietary strategies that might be beneficial Follow-up in 12 months.  Call sooner if needed  Merton Border, MD PCCM service Mobile [phone] Pager [phone] 06/03/2018 8:27 AM

## 2018-06-02 NOTE — Patient Instructions (Signed)
Continue CPAP with sleep We discussed weight loss strategies.  Work with Dr. Diona Browner sizing the best strategy for you  Aloe up in 1 year.  Call sooner if needed

## 2018-06-20 ENCOUNTER — Other Ambulatory Visit: Payer: Self-pay | Admitting: Family Medicine

## 2018-06-20 NOTE — Telephone Encounter (Signed)
Last office visit 05/13/2018 follow up.  Last refilled 05/23/2018 for #180 with no refills.  Next Appt: 07/14/2018 for weight management.

## 2018-06-30 ENCOUNTER — Other Ambulatory Visit: Payer: Self-pay | Admitting: Family Medicine

## 2018-06-30 NOTE — Telephone Encounter (Signed)
Last office visit 05/13/2018 for weight management.  Last refilled 05/09/2018 for 120 with 1 refill. Next Appt 07/14/2018.

## 2018-07-01 ENCOUNTER — Telehealth: Payer: Self-pay | Admitting: Family Medicine

## 2018-07-01 NOTE — Telephone Encounter (Signed)
Medication refilled.  Robin: Call pt to change 4/16 OV to [facility]

## 2018-07-01 NOTE — Telephone Encounter (Signed)
This encounter was created in error - please disregard.

## 2018-07-01 NOTE — Telephone Encounter (Signed)
Pt called back and wants appointment canceled until coronavirus is under control. Appt canceled.

## 2018-07-01 NOTE — Telephone Encounter (Signed)
Left message asking pt to call office  See below message from dr Diona Browner

## 2018-07-06 ENCOUNTER — Other Ambulatory Visit: Payer: Self-pay | Admitting: *Deleted

## 2018-07-06 MED ORDER — PREGABALIN 75 MG PO CAPS: ORAL_CAPSULE | ORAL | 1 refills | Status: DC

## 2018-07-06 NOTE — Telephone Encounter (Signed)
Patient called stating that the pharmacy requested this refill last week. See refill on 06/30/18.

## 2018-07-06 NOTE — Telephone Encounter (Signed)
Does not look like refill was ever signed by Dr. Diona Browner but since she noted medication refilled, I will go ahead and send in.  Rx printed and faxed to Jay at [phone].

## 2018-07-06 NOTE — Telephone Encounter (Signed)
Last office visit 05/13/2018 for weight management.  Last refilled 05/09/2018 for #120 with 1 refill.  No future appointments.

## 2018-07-06 NOTE — Telephone Encounter (Signed)
Pt ok with doxyme appointment

## 2018-07-14 ENCOUNTER — Ambulatory Visit (INDEPENDENT_AMBULATORY_CARE_PROVIDER_SITE_OTHER): Payer: PRIVATE HEALTH INSURANCE | Admitting: Family Medicine

## 2018-07-14 ENCOUNTER — Ambulatory Visit: Payer: PRIVATE HEALTH INSURANCE | Admitting: Family Medicine

## 2018-07-14 ENCOUNTER — Encounter: Payer: Self-pay | Admitting: Family Medicine

## 2018-07-14 VITALS — Ht 67.5 in | Wt 209.0 lb

## 2018-07-14 DIAGNOSIS — N898 Other specified noninflammatory disorders of vagina: Secondary | ICD-10-CM | POA: Diagnosis not present

## 2018-07-14 DIAGNOSIS — R635 Abnormal weight gain: Secondary | ICD-10-CM | POA: Diagnosis not present

## 2018-07-14 DIAGNOSIS — E6609 Other obesity due to excess calories: Secondary | ICD-10-CM | POA: Diagnosis not present

## 2018-07-14 DIAGNOSIS — Z6833 Body mass index (BMI) 33.0-33.9, adult: Secondary | ICD-10-CM

## 2018-07-14 DIAGNOSIS — H9213 Otorrhea, bilateral: Secondary | ICD-10-CM | POA: Diagnosis not present

## 2018-07-14 MED ORDER — FLUCONAZOLE 150 MG PO TABS: 150.0000 mg | ORAL_TABLET | Freq: Once | ORAL | 0 refills | Status: AC

## 2018-07-14 NOTE — Patient Instructions (Addendum)
Stop victoza as ineffective.  For a possible yeast infection.. take fluconazole x 1 tab.  If vaginal symptoms are not improving.. make in office visit for vaginal exam.  Call if ear symptoms are not improving with stopping victoza and using trimacinolone cream twice daily x 2 weeks.

## 2018-07-14 NOTE — Assessment & Plan Note (Signed)
No improvement with victoza despite increased dose. Possible SE. Stop .  Encouraged exercise, weight loss, healthy eating habits.

## 2018-07-14 NOTE — Progress Notes (Signed)
VIRTUAL VISIT Due to national recommendations of social distancing due to [place] 19, a virtual visit is felt to be most appropriate for this patient at this time.   I connected with the patient on 07/14/18 at  9:15 AM EDT by virtual telehealth platform and verified that I am speaking with the correct person using two identifiers.   I discussed the limitations, risks, security and privacy concerns of performing an evaluation and management service by  virtual telehealth platform and the availability of in person appointments. I also discussed with the patient that there may be a patient responsible charge related to this service. The patient expressed understanding and agreed to proceed.  Patient location: Home Provider Location: [facility] Participants: Jillian [name] and Jillian [name]   Chief Complaint  Patient presents with  . Follow-up    Weight Management    History of Present Illness: 63 year old female presents for follow up weight management.   Class 1 obesity  At last OV 3 months ago pt was started  On victoza,  Increased to 1.2 mg daily  On 05/13/2018.   She reports  At home weight is not significantly different than before.  She has been doping intermittant fasting.  Low carbs.. 50-100 mg  Only drinking water.  Exercise: limited by fibromyalgia... walking daily but slowly. Wt Readings from Last 3 Encounters:  07/14/18 209 lb (94.8 kg)  06/02/18 214 lb (97.1 kg)  05/13/18 214 lb (97.1 kg)    She recently has noted bilateral ears draining clear fluids. No pain in ears, no sinus pain, no fever, no cough Clear fluid, itchy ear canal.   Triamcinolone in  canal helps temporarily.   She has some vaginal  Itching and burning... no discharge. Monistat helps.   [place] if all this is due to victoza SE.  COVID 19 screen No recent travel or known exposure to [place] The patient denies respiratory symptoms of COVID 19 at this time.  The importance of social  distancing was discussed today.   Review of Systems  Constitutional: Positive for malaise/fatigue. Negative for chills and fever.  HENT: Negative for congestion and ear pain.   Eyes: Negative for pain and redness.  Respiratory: Negative for cough and shortness of breath.   Cardiovascular: Negative for chest pain, palpitations and leg swelling.  Gastrointestinal: Negative for abdominal pain, blood in stool, constipation, diarrhea, nausea and vomiting.  Genitourinary: Negative for dysuria.  Musculoskeletal: Negative for falls and myalgias.  Skin: Negative for rash.  Neurological: Negative for dizziness.  Psychiatric/Behavioral: Negative for depression. The patient is not nervous/anxious.       Past Medical History:  Diagnosis Date  . Cancer ([place])    basal cell 2016    reports that she quit smoking about 26 years ago. Her smoking use included cigarettes. She has a 15.00 pack-year smoking history. She has never used smokeless tobacco. She reports that she does not drink alcohol or use drugs.   Current Outpatient Medications:  .  buPROPion (WELLBUTRIN XL) 150 MG 24 hr tablet, TAKE 1 TABLET(150 MG) BY MOUTH DAILY, Disp: 90 tablet, Rfl: 1 .  cholecalciferol (VITAMIN D) 400 units TABS tablet, Take 400 Units by mouth 2 (two) times daily., Disp: , Rfl:  .  Coenzyme Q10 (CO Q 10) 100 MG CAPS, Take 1 capsule by mouth daily., Disp: , Rfl:  .  DULoxetine (CYMBALTA) 30 MG capsule, TAKE 2 CAPSULES BY MOUTH DAILY, Disp: 180 capsule, Rfl: 1 .  fish oil-omega-3  fatty acids 1000 MG capsule, Take 3 g by mouth daily. , Disp: , Rfl:  .  liraglutide (VICTOZA) 18 MG/3ML SOPN, 1.2mg  inj SQ daily, Disp: 3 mL, Rfl: 11 .  Magnesium Malate 1250 (141.7 Mg) MG TABS, Take one tablet in the morning and two tablets at bedtime, Disp: , Rfl:  .  metFORMIN (GLUCOPHAGE-XR) 500 MG 24 hr tablet, Take 1 tablet (500 mg total) by mouth daily with breakfast., Disp: 30 tablet, Rfl: 3 .  methocarbamol (ROBAXIN) 500 MG tablet, TAKE  1 TABLET BY MOUTH THREE TIMES DAILY, Disp: 90 tablet, Rfl: 0 .  pregabalin (LYRICA) 75 MG capsule, TAKE ONE CAPSULE BY MOUTH EVERY MORNING, 1 CAPSULE AT NOON AND 2 CAPSULES EVERY NIGHT AT BEDTIME, Disp: 120 capsule, Rfl: 1 .  traMADol (ULTRAM) 50 MG tablet, TAKE 2 TABLETS(100 MG) BY MOUTH THREE TIMES DAILY, Disp: 180 tablet, Rfl: 0 .  TURMERIC PO, Take 2 tablets by mouth 2 (two) times daily., Disp: , Rfl:    Observations/Objective: Height 5' 7.5" (1.715 m), weight 209 lb (94.8 kg).  Physical Exam  Physical Exam Constitutional:      General: She is not in acute distress. Pulmonary:     Effort: Pulmonary effort is normal. No respiratory distress.  Neurological:     Mental Status: She is alert and oriented to person, place, and time.  Psychiatric:        Mood and Affect: Mood normal.        Behavior: Behavior normal.   Assessment and Plan  Weight gain Eval for thyroid issues.  Ear discharge, bilateral Possible chronic otitis externa... if not resolving with topical steroid and stopping victoza.. consider drops and or exam.\  No S/S of infection. Pt given return precautions.  Class 1 obesity due to excess calories without serious comorbidity with body mass index (BMI) of 33.0 to 33.9 in adult No improvement with victoza despite increased dose. Possible SE. Stop .  Encouraged exercise, weight loss, healthy eating habits.   Vagina itching Possible yeast infection.. treat with fluconazole. If not improving  Will need vaginal exam and wet prep in person OV.     I discussed the assessment and treatment plan with the patient. The patient was provided an opportunity to ask questions and all were answered. The patient agreed with the plan and demonstrated an understanding of the instructions.   The patient was advised to call back or seek an in-person evaluation if the symptoms worsen or if the condition fails to improve as anticipated.     Jillian Lofts, MD

## 2018-07-14 NOTE — Assessment & Plan Note (Signed)
Eval for thyroid issues.

## 2018-07-14 NOTE — Assessment & Plan Note (Signed)
Possible yeast infection.. treat with fluconazole. If not improving  Will need vaginal exam and wet prep in person OV.

## 2018-07-14 NOTE — Assessment & Plan Note (Signed)
Possible chronic otitis externa... if not resolving with topical steroid and stopping victoza.. consider drops and or exam.\  No S/S of infection. Pt given return precautions.

## 2018-07-18 ENCOUNTER — Other Ambulatory Visit: Payer: Self-pay | Admitting: Family Medicine

## 2018-07-18 NOTE — Telephone Encounter (Signed)
Last office visit 07/14/2018 for Weight Management.  Last refilled 06/20/2018 for #180 with no refills.  No future appointments with PCP.

## 2018-07-19 ENCOUNTER — Other Ambulatory Visit (INDEPENDENT_AMBULATORY_CARE_PROVIDER_SITE_OTHER): Payer: PRIVATE HEALTH INSURANCE

## 2018-07-19 ENCOUNTER — Other Ambulatory Visit: Payer: Self-pay

## 2018-07-19 DIAGNOSIS — R635 Abnormal weight gain: Secondary | ICD-10-CM

## 2018-07-19 LAB — T4, FREE: Free T4: 0.82 ng/dL (ref 0.60–1.60)

## 2018-07-19 LAB — T3, FREE: T3, Free: 2.8 pg/mL (ref 2.3–4.2)

## 2018-07-19 LAB — TSH: TSH: 2.03 u[IU]/mL (ref 0.35–4.50)

## 2018-07-31 ENCOUNTER — Other Ambulatory Visit: Payer: Self-pay | Admitting: Family Medicine

## 2018-08-01 NOTE — Telephone Encounter (Signed)
Last office visit 07/14/2018 weight management.  Last refilled 08/10/2017 for #90 with no refills. No future appointments.

## 2018-08-15 ENCOUNTER — Other Ambulatory Visit: Payer: Self-pay | Admitting: Family Medicine

## 2018-08-15 NOTE — Telephone Encounter (Signed)
Last visit 07/14/2018 weight management.  Last refilled 07/18/2018 for #180 with no refills.  No future appointments.

## 2018-08-19 ENCOUNTER — Encounter: Payer: Self-pay | Admitting: Family Medicine

## 2018-08-19 ENCOUNTER — Ambulatory Visit (INDEPENDENT_AMBULATORY_CARE_PROVIDER_SITE_OTHER): Payer: PRIVATE HEALTH INSURANCE | Admitting: Family Medicine

## 2018-08-19 DIAGNOSIS — M797 Fibromyalgia: Secondary | ICD-10-CM | POA: Diagnosis not present

## 2018-08-19 MED ORDER — PREGABALIN 150 MG PO CAPS: ORAL_CAPSULE | ORAL | 0 refills | Status: DC

## 2018-08-19 NOTE — Progress Notes (Signed)
Appointment 6/5 Pt aware

## 2018-08-19 NOTE — Progress Notes (Signed)
VIRTUAL VISIT Due to national recommendations of social distancing due to [place] 19, a virtual visit is felt to be most appropriate for this patient at this time.   I connected with the patient on 08/19/18 at 11:20 AM EDT by virtual telehealth platform and verified that I am speaking with the correct person using two identifiers.   I discussed the limitations, risks, security and privacy concerns of performing an evaluation and management service by  virtual telehealth platform and the availability of in person appointments. I also discussed with the patient that there may be a patient responsible charge related to this service. The patient expressed understanding and agreed to proceed.  Patient location: Home Provider Location: [facility] Participants: Eliezer Lofts and Enis Gash   Chief Complaint  Patient presents with  . Fibromyalgia    Having a bad flareup for several weeks.    History of Present Illness: 63 year old female presents for flare up of fibromyalgia.  She reports worsening of pain in legs, shoulders, arms, hips and pelvic areas.  keeping her up at night  Fatigue in legs with walking.  She thinks that the changeing of season has triggered this issue... but this is much worse than usual. Had to take a few days off from work.  She previously was using  Tramadol 2 tabs three times daily for pain She is also taking lyrica 150 mg twice daily,  cymbalta 60 mg, wellbutrin XL and robaxin prn   She denies depression and anxiety.   Sedation on higher dose of cy COVID 19 screen No recent travel or known exposure to [place] The patient denies respiratory symptoms of COVID 19 at this time.  The importance of social distancing was discussed today.   Review of Systems  Constitutional: Positive for malaise/fatigue. Negative for chills and fever.  HENT: Negative for congestion and ear pain.   Eyes: Negative for pain and redness.  Respiratory: Negative for cough  and shortness of breath.   Cardiovascular: Negative for chest pain, palpitations and leg swelling.  Gastrointestinal: Negative for abdominal pain, blood in stool, constipation, diarrhea, nausea and vomiting.  Genitourinary: Negative for dysuria.  Musculoskeletal: Positive for joint pain and myalgias. Negative for falls.  Skin: Negative for rash.  Neurological: Negative for dizziness.  Psychiatric/Behavioral: Negative for depression, substance abuse and suicidal ideas. The patient has insomnia. The patient is not nervous/anxious.       Past Medical History:  Diagnosis Date  . Cancer ([place])    basal cell 2016    reports that she quit smoking about 26 years ago. Her smoking use included cigarettes. She has a 15.00 pack-year smoking history. She has never used smokeless tobacco. She reports that she does not drink alcohol or use drugs.   Current Outpatient Medications:  .  Alpha-Lipoic Acid 600 MG TABS, Take by mouth., Disp: , Rfl:  .  buPROPion (WELLBUTRIN XL) 150 MG 24 hr tablet, TAKE 1 TABLET(150 MG) BY MOUTH DAILY, Disp: 90 tablet, Rfl: 1 .  cholecalciferol (VITAMIN D) 400 units TABS tablet, Take 400 Units by mouth 2 (two) times daily., Disp: , Rfl:  .  Coenzyme Q10 (CO Q 10) 100 MG CAPS, Take 1 capsule by mouth daily., Disp: , Rfl:  .  DULoxetine (CYMBALTA) 30 MG capsule, TAKE 2 CAPSULES BY MOUTH DAILY, Disp: 180 capsule, Rfl: 1 .  fish oil-omega-3 fatty acids 1000 MG capsule, Take 3 g by mouth daily. , Disp: , Rfl:  .  Magnesium Malate  1250 (141.7 Mg) MG TABS, Take one tablet in the morning and two tablets at bedtime, Disp: , Rfl:  .  methocarbamol (ROBAXIN) 500 MG tablet, TAKE 1 TABLET BY MOUTH THREE TIMES DAILY, Disp: 90 tablet, Rfl: 0 .  pregabalin (LYRICA) 75 MG capsule, TAKE ONE CAPSULE BY MOUTH EVERY MORNING, 1 CAPSULE AT NOON AND 2 CAPSULES EVERY NIGHT AT BEDTIME, Disp: 120 capsule, Rfl: 1 .  traMADol (ULTRAM) 50 MG tablet, TAKE 2 TABLETS(100 MG) BY MOUTH THREE TIMES DAILY, Disp:  180 tablet, Rfl: 0 .  TURMERIC PO, Take 2 tablets by mouth 2 (two) times daily., Disp: , Rfl:    Observations/Objective: Weight 211 lb (95.7 kg).  Physical Exam  Physical Exam Constitutional:      General: The patient is not in acute distress. Pulmonary:     Effort: Pulmonary effort is normal. No respiratory distress.  Neurological:     Mental Status: The patient is alert and oriented to person, place, and time.  Psychiatric:        Mood and Affect: Mood normal.        Behavior: Behavior normal.   Assessment and Plan   Fibromyalgia Inadequate control despite multiple medications. Increase lyrica to max ( 450 mg daily) ... if no SE and not effective enough increase Cymbalta to 90 mg daily.  Limit tramadol to 100 mg TID. Follow up in 2 weeks.   I discussed the assessment and treatment plan with the patient. The patient was provided an opportunity to ask questions and all were answered. The patient agreed with the plan and demonstrated an understanding of the instructions.   The patient was advised to call back or seek an in-person evaluation if the symptoms worsen or if the condition fails to improve as anticipated.     Eliezer Lofts, MD

## 2018-08-19 NOTE — Assessment & Plan Note (Signed)
Inadequate control despite multiple medications. Increase lyrica to max ( 450 mg daily) ... if no SE and not effective enough increase Cymbalta to 90 mg daily.  Limit tramadol to 100 mg TID. Follow up in 2 weeks.

## 2018-08-19 NOTE — Patient Instructions (Signed)
Increase lyrica to 150 mg three times daily  I not enough improvement after 1 week, can increase to 90 mg of cymbalta daily.

## 2018-09-02 ENCOUNTER — Encounter: Payer: Self-pay | Admitting: Family Medicine

## 2018-09-02 ENCOUNTER — Ambulatory Visit (INDEPENDENT_AMBULATORY_CARE_PROVIDER_SITE_OTHER): Payer: PRIVATE HEALTH INSURANCE | Admitting: Family Medicine

## 2018-09-02 DIAGNOSIS — M797 Fibromyalgia: Secondary | ICD-10-CM | POA: Diagnosis not present

## 2018-09-02 NOTE — Assessment & Plan Note (Addendum)
Improved control of flare with higher dose Cymbalta and lyrica.  Pt still has many bad days and is overwhelmed with her fibromyalgia.  We will given the lyrica and Cymbalta more time to improve symptoms.  I will look into safe of long acting tramadol.  Offered referral back to rheum or pain MD... pt not interested at this time.   encouraged stress reduction, healthy lifestyle changes.

## 2018-09-02 NOTE — Progress Notes (Signed)
VIRTUAL VISIT Due to national recommendations of social distancing due to [place] 19, a virtual visit is felt to be most appropriate for this patient at this time.   I connected with the patient on 09/02/18 at  9:40 AM EDT by virtual telehealth platform and verified that I am speaking with the correct person using two identifiers.   I discussed the limitations, risks, security and privacy concerns of performing an evaluation and management service by  virtual telehealth platform and the availability of in person appointments. I also discussed with the patient that there may be a patient responsible charge related to this service. The patient expressed understanding and agreed to proceed.  Patient location: Home Provider Location: [facility] Participants: Eliezer Lofts and Enis Gash   Chief Complaint  Patient presents with  . Follow-up    Fibromyalgia    History of Present Illness:  63 year old female patient presents for 2 week  follow up fibromyalgia flare.  At last OV increased lyrica to 150 mg TID.  She reports she has noted some improvement in flare 60 % better ( going on for 5 weeks).  she still feels overwhelmed by the fibromyalgia.  NO SE to higher dose.. no oversedation.  Cymbalta up to 90 mg daily.., in last week. Burning in skin, skin raw, pain over enti She has had 2 good days in last 2 weeks. Worse with weather fronts. When she takes the tramadol.. she does feel relief  She has started Nooom for weight loss.. diet and exercise.  COVID 19 screen No recent travel or known exposure to COVID19 The patient denies respiratory symptoms of COVID 19 at this time.  The importance of social distancing was discussed today.   Review of Systems  Constitutional: Negative for chills and fever.  HENT: Negative for congestion and ear pain.   Eyes: Negative for pain and redness.  Respiratory: Negative for cough and shortness of breath.   Cardiovascular: Negative for  chest pain, palpitations and leg swelling.  Gastrointestinal: Negative for abdominal pain, blood in stool, constipation, diarrhea, nausea and vomiting.  Genitourinary: Negative for dysuria.  Musculoskeletal: Negative for falls and myalgias.  Skin: Negative for rash.  Neurological: Negative for dizziness.  Psychiatric/Behavioral: Negative for depression. The patient is not nervous/anxious.       Past Medical History:  Diagnosis Date  . Cancer ([place])    basal cell 2016    reports that she quit smoking about 26 years ago. Her smoking use included cigarettes. She has a 15.00 pack-year smoking history. She has never used smokeless tobacco. She reports that she does not drink alcohol or use drugs.   Current Outpatient Medications:  .  Alpha-Lipoic Acid 600 MG TABS, Take by mouth., Disp: , Rfl:  .  buPROPion (WELLBUTRIN XL) 150 MG 24 hr tablet, TAKE 1 TABLET(150 MG) BY MOUTH DAILY, Disp: 90 tablet, Rfl: 1 .  cholecalciferol (VITAMIN D) 400 units TABS tablet, Take 400 Units by mouth 2 (two) times daily., Disp: , Rfl:  .  Coenzyme Q10 (CO Q 10) 100 MG CAPS, Take 1 capsule by mouth daily., Disp: , Rfl:  .  DULoxetine (CYMBALTA) 30 MG capsule, TAKE 2 CAPSULES BY MOUTH DAILY, Disp: 180 capsule, Rfl: 1 .  fish oil-omega-3 fatty acids 1000 MG capsule, Take 3 g by mouth daily. , Disp: , Rfl:  .  Magnesium Malate 1250 (141.7 Mg) MG TABS, Take one tablet in the morning and two tablets at bedtime, Disp: , Rfl:  .  methocarbamol (ROBAXIN) 500 MG tablet, TAKE 1 TABLET BY MOUTH THREE TIMES DAILY, Disp: 90 tablet, Rfl: 0 .  pregabalin (LYRICA) 150 MG capsule, TAKE ONE CAPSULE BY MOUTH  Three times daily, Disp: 90 capsule, Rfl: 0 .  traMADol (ULTRAM) 50 MG tablet, TAKE 2 TABLETS(100 MG) BY MOUTH THREE TIMES DAILY, Disp: 180 tablet, Rfl: 0 .  TURMERIC PO, Take 2 tablets by mouth 2 (two) times daily., Disp: , Rfl:    Observations/Objective: Height 5' 7.5" (1.715 m), weight 209 lb (94.8 kg).  Physical Exam   Physical Exam Constitutional:      General: The patient is not in acute distress. Pulmonary:     Effort: Pulmonary effort is normal. No respiratory distress.  Neurological:     Mental Status: The patient is alert and oriented to person, place, and time.  Psychiatric:        Mood and Affect: Mood normal.        Behavior: Behavior normal.   Assessment and Plan Fibromyalgia Improved control of flare with higher dose Cymbalta and lyrica.  Pt still has many bad days and is overwhelmed with her fibromyalgia.  We will given the lyrica and Cymbalta more time to improve symptoms.  I will look into safe of long acting tramadol.  Offered referral back to rheum or pain MD... pt not interested at this time.   encouraged stress reduction, healthy lifestyle changes.     I discussed the assessment and treatment plan with the patient. The patient was provided an opportunity to ask questions and all were answered. The patient agreed with the plan and demonstrated an understanding of the instructions.   The patient was advised to call back or seek an in-person evaluation if the symptoms worsen or if the condition fails to improve as anticipated.     Eliezer Lofts, MD

## 2018-09-13 ENCOUNTER — Other Ambulatory Visit: Payer: Self-pay | Admitting: Family Medicine

## 2018-09-13 NOTE — Telephone Encounter (Signed)
Last office visit 09/02/2018 for fibromyalgia.  Last refilled 08/16/2018 for #180 with no refills.  No future appointments.

## 2018-09-19 ENCOUNTER — Other Ambulatory Visit: Payer: Self-pay | Admitting: Family Medicine

## 2018-09-19 NOTE — Telephone Encounter (Signed)
Last office visit 09/02/2018 for fibromyalgia.  Last refilled 08/19/2018 for #90 with no refills.  CPE scheduled 12/13/2018.

## 2018-10-10 ENCOUNTER — Other Ambulatory Visit: Payer: Self-pay | Admitting: Family Medicine

## 2018-10-10 NOTE — Telephone Encounter (Signed)
Electronic refill request Tramadol Last office visit 09/02/18 Last refill 09/13/18 #180

## 2018-10-18 ENCOUNTER — Other Ambulatory Visit: Payer: Self-pay | Admitting: Family Medicine

## 2018-10-18 NOTE — Telephone Encounter (Signed)
Cannot use electronic prescribing today.. please call in.

## 2018-10-18 NOTE — Telephone Encounter (Signed)
RX called in .

## 2018-10-18 NOTE — Telephone Encounter (Signed)
Electronic refill request Lyrica Last office visit 09/02/18 Last refill 09/20/18 #90

## 2018-11-02 ENCOUNTER — Ambulatory Visit: Payer: PRIVATE HEALTH INSURANCE | Admitting: Family Medicine

## 2018-11-02 ENCOUNTER — Encounter: Payer: Self-pay | Admitting: Family Medicine

## 2018-11-02 ENCOUNTER — Other Ambulatory Visit: Payer: Self-pay

## 2018-11-02 VITALS — BP 120/64 | HR 81 | Temp 98.5°F | Ht 67.5 in | Wt 216.2 lb

## 2018-11-02 DIAGNOSIS — M2391 Unspecified internal derangement of right knee: Secondary | ICD-10-CM

## 2018-11-02 NOTE — Progress Notes (Signed)
Jillian [name] T. Jillian Inks, MD Primary Care and [place] at [facility] [place], [address] Phone: [phone]  FAX: [phone]  Jillian [name] - 63 y.o. female  MRN [id number]  Date of Birth: [birth year]  Visit Date: 11/02/2018  PCP: Jinny Sanders, MD  Referred by: Jinny Sanders, MD  Chief Complaint  Patient presents with  . Knee Pain    Right-Hurt while doing Yoga 3 weeks ago   Subjective:   Jillian [name] is a 63 y.o. very pleasant female patient with Body mass index is 33.37 kg/m. who presents with the following:  Woke up one morning and her R knee was really hurting her. Very painful to walk and that increases the more that walks.  She had been exercising more, and she been doing some yoga.  She wonders whether this could have been what began her knee pain.  She is having posteromedial knee pain.  She has had a minimal effusion, and this is better today compared to prior days where it was larger.  She is not had any trauma that she can think of.  She has no significant history of prior fractures, operative interventions in this region.  3-4 voltaren get, motrin.    Better and then up and down.     Past Medical History, Surgical History, Social History, Family History, Problem List, Medications, and Allergies have been reviewed and updated if relevant.  Patient Active Problem List   Diagnosis Date Noted  . Vagina itching 07/14/2018  . Ear discharge, bilateral 07/14/2018  . Weight gain 07/14/2018  . Class 1 obesity due to excess calories without serious comorbidity with body mass index (BMI) of 33.0 to 33.9 in adult 05/13/2018  . Prediabetes 12/03/2017  . Snoring 02/27/2016  . Non-restorative sleep 02/27/2016  . Vitamin D deficiency 10/28/2015  . Generalized anxiety disorder 11/25/2012  . HYPERTRIGLYCERIDEMIA 12/20/2008  . MENOPAUSE, EARLY 10/04/2008  . Fibromyalgia 10/04/2008    Past Medical History:   Diagnosis Date  . Cancer ([place])    basal cell 2016    Past Surgical History:  Procedure Laterality Date  . HERNIA REPAIR      Social History   Socioeconomic History  . Marital status: Married    Spouse name: Not on file  . Number of children: 2  . Years of education: Not on file  . Highest education level: Not on file  Occupational History  . Occupation: Therapist, sports  Social Needs  . Financial resource strain: Not on file  . Food insecurity    Worry: Not on file    Inability: Not on file  . Transportation needs    Medical: Not on file    Non-medical: Not on file  Tobacco Use  . Smoking status: Former Smoker    Packs/day: 1.00    Years: 15.00    Pack years: 15.00    Types: Cigarettes    Quit date: 03/30/1992    Years since quitting: 26.6  . Smokeless tobacco: Never Used  Substance and Sexual Activity  . Alcohol use: No  . Drug use: No  . Sexual activity: Not on file  Lifestyle  . Physical activity    Days per week: Not on file    Minutes per session: Not on file  . Stress: Not on file  Relationships  . Social Herbalist on phone: Not on file    Gets together: Not on file  Attends religious service: Not on file    Active member of club or organization: Not on file    Attends meetings of clubs or organizations: Not on file    Relationship status: Not on file  . Intimate partner violence    Fear of current or ex partner: Not on file    Emotionally abused: Not on file    Physically abused: Not on file    Forced sexual activity: Not on file  Other Topics Concern  . Not on file  Social History Narrative   Regular exercise-yes, but seasonal walks and swims   Diet: healthy, low carbohydrates, no caffeine    Family History  Problem Relation Age of Onset  . Cancer Father        colon  . Hyperlipidemia Father   . Hypertension Father   . Diabetes Sister   . Hypertension Sister   . Cancer Paternal Aunt        colon  . Cancer Maternal Grandmother         colon  . Hypertension Maternal Grandmother   . Anuerysm Paternal Grandfather     No Known Allergies  Medication list reviewed and updated in full in [place].  GEN: No fevers, chills. Nontoxic. Primarily MSK c/o today. MSK: Detailed in the HPI GI: tolerating PO intake without difficulty Neuro: No numbness, parasthesias, or tingling associated. Otherwise the pertinent positives of the ROS are noted above.   Objective:   BP 120/64   Pulse 81   Temp 98.5 F (36.9 C) (Temporal)   Ht 5' 7.5" (1.715 m)   Wt 216 lb 4 oz (98.1 kg)   SpO2 95%   BMI 33.37 kg/m    GEN: WDWN, NAD, Non-toxic, Alert & Oriented x 3 HEENT: Atraumatic, Normocephalic.  Ears and Nose: No external deformity. EXTR: No clubbing/cyanosis/edema NEURO: Normal gait.  PSYCH: Normally interactive. Conversant. Not depressed or anxious appearing.  Calm demeanor.    Right knee:Marland Kitchen  Full extension.  Flexion to 110 degrees.  No pain with patellar manipulation.  Posteromedial joint line is TTP. Stable to varus and valgus stress.  ACL and PCL are intact.  Bounce home test is equivocal.  Flexion pinch is positive.  McMurray's is positive for pain.  Radiology: No results found.  Assessment and Plan:     ICD-10-CM   1. Internal derangement of right knee  M23.91    >25 minutes spent in face to face time with patient, >50% spent in counselling or coordination of care   Degenerative meniscal tear most likely posteromedial.  Continue with conservative care, NSAIDs, Tylenol, icing, Voltaren gel.  Continue with knee brace that she already has.  Compressive neoprene brace may also be appropriate.  Follow-up 2 months.  At that point consideration of other conservative treatment such as knee injection and/or physical therapy is appropriate.  Follow-up: No follow-ups on file.  No orders of the defined types were placed in this encounter.  No orders of the defined types were placed in this  encounter.   Signed,  Maud Deed. Kanden Carey, MD   Outpatient Encounter Medications as of 11/02/2018  Medication Sig  . Alpha-Lipoic Acid 600 MG TABS Take by mouth.  Marland Kitchen buPROPion (WELLBUTRIN XL) 150 MG 24 hr tablet TAKE 1 TABLET(150 MG) BY MOUTH DAILY  . cholecalciferol (VITAMIN D) 400 units TABS tablet Take 400 Units by mouth 2 (two) times daily.  . Coenzyme Q10 (CO Q 10) 100 MG CAPS Take 1 capsule by mouth daily.  Marland Kitchen  DULoxetine (CYMBALTA) 30 MG capsule Take 90 mg by mouth daily.  . fish oil-omega-3 fatty acids 1000 MG capsule Take 3 g by mouth daily.   . Magnesium Malate 1250 (141.7 Mg) MG TABS Take one tablet in the morning and two tablets at bedtime  . methocarbamol (ROBAXIN) 500 MG tablet TAKE 1 TABLET BY MOUTH THREE TIMES DAILY  . pregabalin (LYRICA) 150 MG capsule TAKE 1 CAPSULE BY MOUTH THREE TIMES DAILY  . traMADol (ULTRAM) 50 MG tablet TAKE 2 TABLETS(100 MG) BY MOUTH THREE TIMES DAILY  . TURMERIC PO Take 2 tablets by mouth 2 (two) times daily.  . [DISCONTINUED] DULoxetine (CYMBALTA) 30 MG capsule TAKE 2 CAPSULES BY MOUTH DAILY (Patient taking differently: 90 mg. )   No facility-administered encounter medications on file as of 11/02/2018.

## 2018-11-07 ENCOUNTER — Other Ambulatory Visit: Payer: Self-pay | Admitting: Family Medicine

## 2018-11-07 NOTE — Telephone Encounter (Signed)
Last office visit 11/02/2018 with Dr. Lorelei Pont for knee pain.  Last refilled 10/11/2018 for #180 with no refills.  CPE scheduled for 12/13/2018.

## 2018-11-16 ENCOUNTER — Ambulatory Visit: Payer: PRIVATE HEALTH INSURANCE | Admitting: Family Medicine

## 2018-11-16 ENCOUNTER — Encounter: Payer: Self-pay | Admitting: Family Medicine

## 2018-11-16 ENCOUNTER — Other Ambulatory Visit: Payer: Self-pay

## 2018-11-16 ENCOUNTER — Ambulatory Visit (INDEPENDENT_AMBULATORY_CARE_PROVIDER_SITE_OTHER)
Admission: RE | Admit: 2018-11-16 | Discharge: 2018-11-16 | Disposition: A | Discharge status: Home / Self Care | Payer: PRIVATE HEALTH INSURANCE | Source: Ambulatory Visit | Attending: Family Medicine | Admitting: Family Medicine

## 2018-11-16 VITALS — BP 104/64 | HR 85 | Temp 97.4°F | Ht 67.5 in | Wt 220.5 lb

## 2018-11-16 DIAGNOSIS — M25561 Pain in right knee: Secondary | ICD-10-CM

## 2018-11-16 NOTE — Progress Notes (Signed)
Jillian [name] T. Jillian Akhter, MD Primary Care and [place] at [facility] [place], [address] Phone: [phone]  FAX: [phone]  Jillian BALLA - 63 y.o. [name]  MRN [id number]  Date of Birth: 07-08-1955  Visit Date: 11/16/2018  PCP: Jinny Sanders, MD  Referred by: Jinny Sanders, MD  Chief Complaint  Patient presents with  . Knee Pain    Right   Subjective:   Jillian [name] is a 63 y.o. very pleasant [name] patient with Body mass index is 34.03 kg/m. who presents with the following:  Last time I saw the patient the patient was doing reasonably okay.  Over the last 2 weeks she has gotten dramatically worse and now she is having difficulty bearing weight.  She does not have a significant effusion but she does have a mild effusion.  She does have medial pain diffusely.  She has been doing home rehab without much significant success.  She has not had any locking up of the joint or other mechanical symptoms.  2 weeks it has gotten a lot worse.  Had been doing  3 1/2 weeks before then 5 1/2 weeks.   Flexion pinch is wildly pos Bounce home is pos  MW in [facility] for knee -   11/02/2018 Last OV with Owens Loffler, MD  Woke up one morning and her R knee was really hurting her. Very painful to walk and that increases the more that walks.  She had been exercising more, and she been doing some yoga.  She wonders whether this could have been what began her knee pain.  She is having posteromedial knee pain.  She has had a minimal effusion, and this is better today compared to prior days where it was larger.  She is not had any trauma that she can think of.  She has no significant history of prior fractures, operative interventions in this region.  3-4 voltaren get, motrin.    Better and then up and down.   Past Medical History, Surgical History, Social History, Family History, Problem List, Medications, and Allergies have been  reviewed and updated if relevant.  Patient Active Problem List   Diagnosis Date Noted  . Vagina itching 07/14/2018  . Ear discharge, bilateral 07/14/2018  . Weight gain 07/14/2018  . Class 1 obesity due to excess calories without serious comorbidity with body mass index (BMI) of 33.0 to 33.9 in adult 05/13/2018  . Prediabetes 12/03/2017  . Snoring 02/27/2016  . Non-restorative sleep 02/27/2016  . Vitamin D deficiency 10/28/2015  . Generalized anxiety disorder 11/25/2012  . HYPERTRIGLYCERIDEMIA 12/20/2008  . MENOPAUSE, EARLY 10/04/2008  . Fibromyalgia 10/04/2008    Past Medical History:  Diagnosis Date  . Cancer ([place])    basal cell 2016    Past Surgical History:  Procedure Laterality Date  . HERNIA REPAIR      Social History   Socioeconomic History  . Marital status: Married    Spouse name: Not on file  . Number of children: 2  . Years of education: Not on file  . Highest education level: Not on file  Occupational History  . Occupation: Therapist, sports  Social Needs  . Financial resource strain: Not on file  . Food insecurity    Worry: Not on file    Inability: Not on file  . Transportation needs    Medical: Not on file    Non-medical: Not on file  Tobacco Use  .  Smoking status: Former Smoker    Packs/day: 1.00    Years: 15.00    Pack years: 15.00    Types: Cigarettes    Quit date: 03/30/1992    Years since quitting: 26.6  . Smokeless tobacco: Never Used  Substance and Sexual Activity  . Alcohol use: No  . Drug use: No  . Sexual activity: Not on file  Lifestyle  . Physical activity    Days per week: Not on file    Minutes per session: Not on file  . Stress: Not on file  Relationships  . Social Herbalist on phone: Not on file    Gets together: Not on file    Attends religious service: Not on file    Active member of club or organization: Not on file    Attends meetings of clubs or organizations: Not on file    Relationship status: Not on file  .  Intimate partner violence    Fear of current or ex partner: Not on file    Emotionally abused: Not on file    Physically abused: Not on file    Forced sexual activity: Not on file  Other Topics Concern  . Not on file  Social History Narrative   Regular exercise-yes, but seasonal walks and swims   Diet: healthy, low carbohydrates, no caffeine    Family History  Problem Relation Age of Onset  . Cancer Father        colon  . Hyperlipidemia Father   . Hypertension Father   . Diabetes Sister   . Hypertension Sister   . Cancer Paternal Aunt        colon  . Cancer Maternal Grandmother        colon  . Hypertension Maternal Grandmother   . Anuerysm Paternal Grandfather     No Known Allergies  Medication list reviewed and updated in full in [place].  GEN: No fevers, chills. Nontoxic. Primarily MSK c/o today. MSK: Detailed in the HPI GI: tolerating PO intake without difficulty Neuro: No numbness, parasthesias, or tingling associated. Otherwise the pertinent positives of the ROS are noted above.   Objective:   BP 104/64   Pulse 85   Temp (!) 97.4 F (36.3 C) (Temporal)   Ht 5' 7.5" (1.715 m)   Wt 220 lb 8 oz (100 kg)   SpO2 92%   BMI 34.03 kg/m    GEN: WDWN, NAD, Non-toxic, Alert & Oriented x 3 HEENT: Atraumatic, Normocephalic.  Ears and Nose: No external deformity. EXTR: No clubbing/cyanosis/edema NEURO: ANTALGIC GAIT PSYCH: Normally interactive. Conversant. Not depressed or anxious appearing.  Calm demeanor.    Right knee lacks 5 degrees of extension.  Flexion to 100 degrees.  Mild effusion.  Stable to varus and valgus stress.  Unable to tolerate ACL and PCL exam.  Bounce home test is significantly positive.  Flexion pinch test of the knee is dramatically positive with flexion even beyond 90 degrees.  Unable to tolerate other special testing of the knee secondary to pain.  She does have pain primarily medially.  Radiology: Dg Knee 4 Views W/patella  Right  Result Date: 11/16/2018 CLINICAL DATA:  63 year old presenting with a 6 week history of severe RIGHT knee pain. No known injuries. EXAM: RIGHT KNEE - COMPLETE 4+ VIEW COMPARISON:  None. FINDINGS: No evidence of acute, subacute or healed fractures. Moderate MEDIAL and patellofemoral compartment joint space narrowing. LATERAL compartment joint space well-preserved. Mild enthesopathic spurring at the insertion  of the patellar tendon on the INFERIOR patella and the tibial tubercle. Mild enthesopathic spurring at the insertion of the quadriceps tendon on the SUPERIOR patella. Well-preserved bone mineral density. Small joint effusion. No evidence of a patellar tracking abnormality on the sunrise view. IMPRESSION: 1. Moderate MEDIAL and patellofemoral compartment osteoarthritis. 2. Small joint effusion. Electronically Signed   By: Evangeline Dakin M.D.   On: 11/16/2018 14:00    Assessment and Plan:     ICD-10-CM   1. Acute pain of right knee  M25.561 DG Knee 4 Views W/Patella Right    MR Knee Right Wo Contrast   The patient has done quite poorly particularly in the last 2 weeks, dramatic change from baseline even on her last exam by me 2 weeks ago.  Obtain an MRI of the right knee without contrast.  Clinical concern is occult tibial plateau fracture not seen on plain x-ray, tibial plateau insufficiency fracture.  Meniscal pathology would also be in the differential diagnosis.  Follow-up: No follow-ups on file.  No orders of the defined types were placed in this encounter.  Orders Placed This Encounter  Procedures  . DG Knee 4 Views W/Patella Right  . MR Knee Right Wo Contrast    Signed,  Constantine Ruddick T. Naquan Garman, MD   Outpatient Encounter Medications as of 11/16/2018  Medication Sig  . Alpha-Lipoic Acid 600 MG TABS Take by mouth.  Marland Kitchen buPROPion (WELLBUTRIN XL) 150 MG 24 hr tablet TAKE 1 TABLET(150 MG) BY MOUTH DAILY  . cholecalciferol (VITAMIN D) 400 units TABS tablet Take 400 Units by mouth 2  (two) times daily.  . Coenzyme Q10 (CO Q 10) 100 MG CAPS Take 1 capsule by mouth daily.  . DULoxetine (CYMBALTA) 30 MG capsule Take 90 mg by mouth daily.  . fish oil-omega-3 fatty acids 1000 MG capsule Take 3 g by mouth daily.   . Magnesium Malate 1250 (141.7 Mg) MG TABS Take one tablet in the morning and two tablets at bedtime  . methocarbamol (ROBAXIN) 500 MG tablet TAKE 1 TABLET BY MOUTH THREE TIMES DAILY  . pregabalin (LYRICA) 150 MG capsule TAKE 1 CAPSULE BY MOUTH THREE TIMES DAILY  . traMADol (ULTRAM) 50 MG tablet TAKE 2 TABLETS(100 MG) BY MOUTH THREE TIMES DAILY  . TURMERIC PO Take 2 tablets by mouth 2 (two) times daily.   No facility-administered encounter medications on file as of 11/16/2018.

## 2018-11-28 ENCOUNTER — Ambulatory Visit
Admission: RE | Admit: 2018-11-28 | Discharge: 2018-11-28 | Disposition: A | Discharge status: Home / Self Care | Payer: No Typology Code available for payment source | Source: Ambulatory Visit | Attending: Family Medicine | Admitting: Family Medicine

## 2018-11-28 ENCOUNTER — Other Ambulatory Visit: Payer: Self-pay

## 2018-11-28 DIAGNOSIS — M25561 Pain in right knee: Secondary | ICD-10-CM | POA: Insufficient documentation

## 2018-11-30 ENCOUNTER — Telehealth: Payer: Self-pay

## 2018-11-30 ENCOUNTER — Other Ambulatory Visit: Payer: Self-pay | Admitting: Family Medicine

## 2018-11-30 DIAGNOSIS — S83206A Unspecified tear of unspecified meniscus, current injury, right knee, initial encounter: Secondary | ICD-10-CM

## 2018-11-30 DIAGNOSIS — M1711 Unilateral primary osteoarthritis, right knee: Secondary | ICD-10-CM

## 2018-11-30 NOTE — Progress Notes (Signed)
OS referral

## 2018-11-30 NOTE — Telephone Encounter (Signed)
Pt left v/m requesting cb with MRI of knee results and next step.Please advise.

## 2018-11-30 NOTE — Telephone Encounter (Signed)
This has been done.

## 2018-12-06 ENCOUNTER — Other Ambulatory Visit: Payer: Self-pay | Admitting: *Deleted

## 2018-12-06 MED ORDER — TRAMADOL HCL 50 MG PO TABS: ORAL_TABLET | ORAL | 0 refills | Status: DC

## 2018-12-06 NOTE — Telephone Encounter (Signed)
Last office visit 11/16/2018 with Dr. Lorelei Pont for knee pain.  Last refilled 11/08/2018 for #180 with no refills.  CPE scheduled for 12/13/2018.

## 2018-12-12 ENCOUNTER — Other Ambulatory Visit: Payer: Self-pay

## 2018-12-12 ENCOUNTER — Emergency Department: Payer: PRIVATE HEALTH INSURANCE

## 2018-12-12 ENCOUNTER — Inpatient Hospital Stay
Admission: EM | Admit: 2018-12-12 | Discharge: 2018-12-15 | DRG: 581 | Disposition: A | Discharge status: Home / Self Care | Payer: PRIVATE HEALTH INSURANCE | Attending: Internal Medicine | Admitting: Internal Medicine

## 2018-12-12 ENCOUNTER — Encounter: Payer: Self-pay | Admitting: Emergency Medicine

## 2018-12-12 DIAGNOSIS — E669 Obesity, unspecified: Secondary | ICD-10-CM | POA: Diagnosis present

## 2018-12-12 DIAGNOSIS — Z87891 Personal history of nicotine dependence: Secondary | ICD-10-CM | POA: Diagnosis not present

## 2018-12-12 DIAGNOSIS — F329 Major depressive disorder, single episode, unspecified: Secondary | ICD-10-CM | POA: Diagnosis present

## 2018-12-12 DIAGNOSIS — Z79899 Other long term (current) drug therapy: Secondary | ICD-10-CM

## 2018-12-12 DIAGNOSIS — R220 Localized swelling, mass and lump, head: Secondary | ICD-10-CM | POA: Diagnosis not present

## 2018-12-12 DIAGNOSIS — L03211 Cellulitis of face: Principal | ICD-10-CM | POA: Diagnosis present

## 2018-12-12 DIAGNOSIS — Z20828 Contact with and (suspected) exposure to other viral communicable diseases: Secondary | ICD-10-CM | POA: Diagnosis present

## 2018-12-12 DIAGNOSIS — M272 Inflammatory conditions of jaws: Secondary | ICD-10-CM | POA: Diagnosis present

## 2018-12-12 DIAGNOSIS — L0291 Cutaneous abscess, unspecified: Secondary | ICD-10-CM | POA: Diagnosis present

## 2018-12-12 DIAGNOSIS — L03221 Cellulitis of neck: Secondary | ICD-10-CM

## 2018-12-12 DIAGNOSIS — Z6833 Body mass index (BMI) 33.0-33.9, adult: Secondary | ICD-10-CM | POA: Diagnosis not present

## 2018-12-12 LAB — CBC WITH DIFFERENTIAL/PLATELET
Abs Immature Granulocytes: 0.05 10*3/uL (ref 0.00–0.07)
Basophils Absolute: 0.1 10*3/uL (ref 0.0–0.1)
Basophils Relative: 1 %
Eosinophils Absolute: 0.1 10*3/uL (ref 0.0–0.5)
Eosinophils Relative: 1 %
HCT: 38.5 % (ref 36.0–46.0)
Hemoglobin: 12.8 g/dL (ref 12.0–15.0)
Immature Granulocytes: 1 %
Lymphocytes Relative: 16 %
Lymphs Abs: 1.7 10*3/uL (ref 0.7–4.0)
MCH: 30.9 pg (ref 26.0–34.0)
MCHC: 33.2 g/dL (ref 30.0–36.0)
MCV: 93 fL (ref 80.0–100.0)
Monocytes Absolute: 1.1 10*3/uL — ABNORMAL HIGH (ref 0.1–1.0)
Monocytes Relative: 10 %
Neutro Abs: 7.8 10*3/uL — ABNORMAL HIGH (ref 1.7–7.7)
Neutrophils Relative %: 71 %
Platelets: 205 10*3/uL (ref 150–400)
RBC: 4.14 MIL/uL (ref 3.87–5.11)
RDW: 12.2 % (ref 11.5–15.5)
WBC: 10.9 10*3/uL — ABNORMAL HIGH (ref 4.0–10.5)
nRBC: 0 % (ref 0.0–0.2)

## 2018-12-12 LAB — COMPREHENSIVE METABOLIC PANEL
ALT: 36 U/L (ref 0–44)
AST: 22 U/L (ref 15–41)
Albumin: 4 g/dL (ref 3.5–5.0)
Alkaline Phosphatase: 90 U/L (ref 38–126)
Anion gap: 8 (ref 5–15)
BUN: 22 mg/dL (ref 8–23)
CO2: 24 mmol/L (ref 22–32)
Calcium: 9.4 mg/dL (ref 8.9–10.3)
Chloride: 104 mmol/L (ref 98–111)
Creatinine, Ser: 0.83 mg/dL (ref 0.44–1.00)
GFR calc Af Amer: >60 mL/min (ref >60)
GFR calc non Af Amer: >60 mL/min (ref >60)
Glucose, Bld: 97 mg/dL (ref 70–99)
Potassium: 4.4 mmol/L (ref 3.5–5.1)
Sodium: 136 mmol/L (ref 135–145)
Total Bilirubin: 0.6 mg/dL (ref 0.3–1.2)
Total Protein: 6.7 g/dL (ref 6.5–8.1)

## 2018-12-12 LAB — LACTIC ACID, PLASMA: Lactic Acid, Venous: 0.7 mmol/L (ref 0.5–1.9)

## 2018-12-12 MED ORDER — METHOCARBAMOL 500 MG PO TABS
500.0000 mg | ORAL_TABLET | Freq: Three times a day (TID) | ORAL | Status: DC | PRN
  Filled 2018-12-12: qty 1

## 2018-12-12 MED ORDER — SODIUM CHLORIDE 0.9 % IV SOLN
3.0000 g | Freq: Four times a day (QID) | INTRAVENOUS | Status: DC
  Administered 2018-12-12 – 2018-12-15 (×10): 3 g via INTRAVENOUS
  Filled 2018-12-12 (×5): qty 8
  Filled 2018-12-12 (×3): qty 3
  Filled 2018-12-12: qty 8
  Filled 2018-12-12 (×3): qty 3
  Filled 2018-12-12: qty 8
  Filled 2018-12-12 (×4): qty 3

## 2018-12-12 MED ORDER — DULOXETINE HCL 30 MG PO CPEP
90.0000 mg | ORAL_CAPSULE | Freq: Every day | ORAL | Status: DC
  Administered 2018-12-13 – 2018-12-15 (×3): 90 mg via ORAL
  Filled 2018-12-12 (×3): qty 3

## 2018-12-12 MED ORDER — ENOXAPARIN SODIUM 40 MG/0.4ML ~~LOC~~ SOLN
40.0000 mg | SUBCUTANEOUS | Status: DC
  Administered 2018-12-12 – 2018-12-14 (×3): 40 mg via SUBCUTANEOUS
  Filled 2018-12-12 (×3): qty 0.4

## 2018-12-12 MED ORDER — OXYCODONE-ACETAMINOPHEN 5-325 MG PO TABS
1.0000 | ORAL_TABLET | Freq: Once | ORAL | Status: AC
  Administered 2018-12-12: 1 via ORAL
  Filled 2018-12-12: qty 1

## 2018-12-12 MED ORDER — LIDOCAINE-PRILOCAINE 2.5-2.5 % EX CREA
TOPICAL_CREAM | Freq: Once | CUTANEOUS | Status: AC
  Administered 2018-12-12: 16:00:00 via TOPICAL
  Filled 2018-12-12: qty 5

## 2018-12-12 MED ORDER — CO Q 10 100 MG PO CAPS: 1.0000 | ORAL_CAPSULE | Freq: Every day | ORAL | Status: DC

## 2018-12-12 MED ORDER — ONDANSETRON HCL 4 MG PO TABS: 4.0000 mg | ORAL_TABLET | Freq: Four times a day (QID) | ORAL | Status: DC | PRN

## 2018-12-12 MED ORDER — OMEGA-3-ACID ETHYL ESTERS 1 G PO CAPS: 1000.0000 mg | ORAL_CAPSULE | Freq: Every day | ORAL | Status: DC

## 2018-12-12 MED ORDER — CHOLECALCIFEROL 10 MCG (400 UNIT) PO TABS
400.0000 [IU] | ORAL_TABLET | Freq: Two times a day (BID) | ORAL | Status: DC
  Administered 2018-12-12 – 2018-12-15 (×6): 400 [IU] via ORAL
  Filled 2018-12-12 (×7): qty 1

## 2018-12-12 MED ORDER — POLYETHYLENE GLYCOL 3350 17 G PO PACK: 17.0000 g | PACK | Freq: Every day | ORAL | Status: DC | PRN

## 2018-12-12 MED ORDER — OMEGA-3-ACID ETHYL ESTERS 1 G PO CAPS
3000.0000 mg | ORAL_CAPSULE | Freq: Every day | ORAL | Status: DC
  Administered 2018-12-13 – 2018-12-15 (×3): 3000 mg via ORAL
  Filled 2018-12-12 (×3): qty 3

## 2018-12-12 MED ORDER — HYDROCODONE-ACETAMINOPHEN 5-325 MG PO TABS
1.0000 | ORAL_TABLET | ORAL | Status: DC | PRN
  Administered 2018-12-12 – 2018-12-14 (×4): 2 via ORAL
  Administered 2018-12-14: 1 via ORAL
  Filled 2018-12-12 (×4): qty 2
  Filled 2018-12-12: qty 1

## 2018-12-12 MED ORDER — SODIUM CHLORIDE 0.9 % IV SOLN
3.0000 g | Freq: Once | INTRAVENOUS | Status: AC
  Administered 2018-12-12: 3 g via INTRAVENOUS
  Filled 2018-12-12: qty 8

## 2018-12-12 MED ORDER — ACETAMINOPHEN 650 MG RE SUPP: 650.0000 mg | Freq: Four times a day (QID) | RECTAL | Status: DC | PRN

## 2018-12-12 MED ORDER — PREGABALIN 75 MG PO CAPS
150.0000 mg | ORAL_CAPSULE | Freq: Three times a day (TID) | ORAL | Status: DC
  Administered 2018-12-12 – 2018-12-15 (×8): 150 mg via ORAL
  Filled 2018-12-12 (×8): qty 2

## 2018-12-12 MED ORDER — TRAMADOL HCL 50 MG PO TABS
100.0000 mg | ORAL_TABLET | Freq: Three times a day (TID) | ORAL | Status: DC
  Administered 2018-12-12 – 2018-12-15 (×8): 100 mg via ORAL
  Filled 2018-12-12 (×8): qty 2

## 2018-12-12 MED ORDER — BUPROPION HCL ER (XL) 150 MG PO TB24
150.0000 mg | ORAL_TABLET | Freq: Every day | ORAL | Status: DC
  Administered 2018-12-13 – 2018-12-15 (×3): 150 mg via ORAL
  Filled 2018-12-12 (×3): qty 1

## 2018-12-12 MED ORDER — MAGNESIUM MALATE 1250 (141.7 MG) MG PO TABS: 1.0000 | ORAL_TABLET | ORAL | Status: DC

## 2018-12-12 MED ORDER — IOHEXOL 300 MG/ML  SOLN
75.0000 mL | Freq: Once | INTRAMUSCULAR | Status: AC | PRN
  Administered 2018-12-12: 75 mL via INTRAVENOUS

## 2018-12-12 MED ORDER — ACETAMINOPHEN 325 MG PO TABS: 650.0000 mg | ORAL_TABLET | Freq: Four times a day (QID) | ORAL | Status: DC | PRN

## 2018-12-12 MED ORDER — TURMERIC 500 MG PO CAPS: 1000.0000 mg | ORAL_CAPSULE | Freq: Two times a day (BID) | ORAL | Status: DC

## 2018-12-12 MED ORDER — ONDANSETRON HCL 4 MG/2ML IJ SOLN: 4.0000 mg | Freq: Four times a day (QID) | INTRAMUSCULAR | Status: DC | PRN

## 2018-12-12 MED ORDER — BUPIVACAINE HCL (PF) 0.5 % IJ SOLN
30.0000 mL | Freq: Once | INTRAMUSCULAR | Status: AC
  Administered 2018-12-12: 30 mL
  Filled 2018-12-12: qty 30

## 2018-12-12 NOTE — Progress Notes (Signed)
PHARMACIST - PHYSICIAN ORDER COMMUNICATION  CONCERNING: P&T Medication Policy on Herbal Medications  DESCRIPTION:  This patient's order for:  Turmeric, Co Q-10, Magnesium malate  has been noted.  This product(s) is classified as an "herbal" or natural product. Due to a lack of definitive safety studies or FDA approval, nonstandard manufacturing practices, plus the potential risk of unknown drug-drug interactions while on inpatient medications, the Pharmacy and Therapeutics Committee does not permit the use of "herbal" or natural products of this type within [facility].   ACTION TAKEN: The pharmacy department is unable to verify this order at this time and your patient has been informed of this safety policy. Please reevaluate patient's clinical condition at discharge and address if the herbal or natural product(s) should be resumed at that time.

## 2018-12-12 NOTE — ED Notes (Signed)
ED TO INPATIENT HANDOFF REPORT  ED Nurse Name and Phone #: Joelene Millin [phone]  S Name/Age/Gender Jillian [name] 63 y.o. female Room/Bed: ED10A/ED10A  Code Status   Code Status: Not on file  Home/SNF/Other Home Patient oriented to: self, place, time and situation Is this baseline? Yes   Triage Complete: Triage complete  Chief Complaint Abcess  Triage Note Pt here for abscess to left jaw.  Was started on abx yesterday AM so has had over 24 hrs now of abx with increasing redness.  Swelling to left jaw and redness expanding from lower jaw to left cheek and down to neck and just above suprasternal notch.  No fever in triage.  Has been using motrin/tylenol; last dose 6 AM.     Allergies No Known Allergies  Level of Care/Admitting Diagnosis ED Disposition    ED Disposition Condition [facility] Area: [place] [[id number]]  Level of Care: Med-Surg [16]  Covid Evaluation: N/A  Diagnosis: Abscess [id number]  Admitting Physician: Bettey Costa [id number]  Attending Physician: MODY, Ulice Bold [id number]  Estimated length of stay: past midnight tomorrow  Certification:: I certify this patient will need inpatient services for at least 2 midnights  PT Class (Do Not Modify): Inpatient [101]  PT Acc Code (Do Not Modify): Private [1]       B Medical/Surgery History Past Medical History:  Diagnosis Date  . Cancer ([place])    basal cell 2016   Past Surgical History:  Procedure Laterality Date  . HERNIA REPAIR       A IV Location/Drains/Wounds Patient Lines/Drains/Airways Status   Active Line/Drains/Airways    Name:   Placement date:   Placement time:   Site:   Days:   Peripheral IV 12/12/18 Right Antecubital   12/12/18    1520    Antecubital   less than 1          Intake/Output Last 24 hours No intake or output data in the 24 hours ending 12/12/18 1949  Labs/Imaging Results for orders placed or performed during the hospital encounter of 12/12/18  (from the past 48 hour(s))  Lactic acid, plasma     Status: None   Collection Time: 12/12/18 12:54 PM  Result Value Ref Range   Lactic Acid, Venous 0.7 0.5 - 1.9 mmol/L    Comment: Performed at [facility], [address] [place], [place] [address]  Comprehensive metabolic panel     Status: None   Collection Time: 12/12/18 12:54 PM  Result Value Ref Range   Sodium 136 135 - 145 mmol/L   Potassium 4.4 3.5 - 5.1 mmol/L   Chloride 104 98 - 111 mmol/L   CO2 24 22 - 32 mmol/L   Glucose, Bld 97 70 - 99 mg/dL   BUN 22 8 - 23 mg/dL   Creatinine, Ser 0.83 0.44 - 1.00 mg/dL   Calcium 9.4 8.9 - 10.3 mg/dL   Total Protein 6.7 6.5 - 8.1 g/dL   Albumin 4.0 3.5 - 5.0 g/dL   AST 22 15 - 41 U/L   ALT 36 0 - 44 U/L   Alkaline Phosphatase 90 38 - 126 U/L   Total Bilirubin 0.6 0.3 - 1.2 mg/dL   GFR calc non Af Amer >60 >60 mL/min   GFR calc Af Amer >60 >60 mL/min   Anion gap 8 5 - 15    Comment: Performed at [facility], [address] [place], [place] [address]  CBC with Differential  Status: Abnormal   Collection Time: 12/12/18 12:54 PM  Result Value Ref Range   WBC 10.9 (H) 4.0 - 10.5 K/uL   RBC 4.14 3.87 - 5.11 MIL/uL   Hemoglobin 12.8 12.0 - 15.0 g/dL   HCT 38.5 36.0 - 46.0 %   MCV 93.0 80.0 - 100.0 fL   MCH 30.9 26.0 - 34.0 pg   MCHC 33.2 30.0 - 36.0 g/dL   RDW 12.2 11.5 - 15.5 %   Platelets 205 150 - 400 K/uL   nRBC 0.0 0.0 - 0.2 %   Neutrophils Relative % 71 %   Neutro Abs 7.8 (H) 1.7 - 7.7 K/uL   Lymphocytes Relative 16 %   Lymphs Abs 1.7 0.7 - 4.0 K/uL   Monocytes Relative 10 %   Monocytes Absolute 1.1 (H) 0.1 - 1.0 K/uL   Eosinophils Relative 1 %   Eosinophils Absolute 0.1 0.0 - 0.5 K/uL   Basophils Relative 1 %   Basophils Absolute 0.1 0.0 - 0.1 K/uL   Immature Granulocytes 1 %   Abs Immature Granulocytes 0.05 0.00 - 0.07 K/uL    Comment: Performed at [facility], [address], [place], [place] [address]   Ct Soft Tissue Neck W  Contrast  Result Date: 12/12/2018 CLINICAL DATA:  Left jaw abscess. Increasing redness and swelling despite antibiotic treatment. EXAM: CT NECK WITH CONTRAST TECHNIQUE: Multidetector CT imaging of the neck was performed using the standard protocol following the bolus administration of intravenous contrast. CONTRAST:  70mL OMNIPAQUE IOHEXOL 300 MG/ML  SOLN COMPARISON:  None. FINDINGS: Pharynx and larynx: No pharyngeal mass or swelling is identified. The airway is patent. Salivary glands: No inflammation, mass, or stone. Thyroid: Unremarkable. Lymph nodes: Subcentimeter short axis left level I and II lymph nodes are slightly more prominent on the left and likely reactive. Vascular: Major vascular structures of the neck are patent. Limited intracranial: Unremarkable. Visualized orbits: Unremarkable. Mastoids and visualized paranasal sinuses: Mild mucosal thickening in the left maxillary sinus. Clear mastoid air cells. Skeleton: Mild cervical spondylosis. No suspicious osseous lesion. Upper chest: Clear lung apices. Other: Inflammatory changes are present in the predominantly subcutaneous soft tissues of the left lower face overlying the mandibular body. No organized fluid collection is present, and there is no evidence of significant deep space inflammation. IMPRESSION: Superficial inflammation in the left lower face compatible with cellulitis. No abscess. Electronically Signed   By: Logan Bores M.D.   On: 12/12/2018 17:49    Pending Labs Unresulted Labs (From admission, onward)    Start     Ordered   12/12/18 1527  Blood culture (routine x 2)  BLOOD CULTURE X 2,   STAT     12/12/18 1526   12/12/18 1522  SARS CORONAVIRUS 2 (TAT 6-24 HRS) Nasopharyngeal Nasopharyngeal Swab  (Asymptomatic/Tier 2 Patients Labs)  ONCE - STAT,   STAT    Question Answer Comment  Is this test for diagnosis or screening Screening   Symptomatic for COVID-19 as defined by CDC No   Hospitalized for COVID-19 No   Admitted to ICU  for COVID-19 No   Previously tested for COVID-19 No   Resident in a congregate (group) care setting Unknown   Employed in healthcare setting Unknown   Pregnant No      12/12/18 1521   Signed and Held  HIV antibody (Routine Testing)  Once,   R     Signed and Held   Signed and Held  CBC  (enoxaparin (LOVENOX)  CrCl >/= 30 ml/min)  Once,   R    Comments: Baseline for enoxaparin therapy IF NOT ALREADY DRAWN.  Notify MD if PLT < 100 K.    Signed and Held   Signed and Held  Creatinine, serum  (enoxaparin (LOVENOX)    CrCl >/= 30 ml/min)  Once,   R    Comments: Baseline for enoxaparin therapy IF NOT ALREADY DRAWN.    Signed and Held   Signed and Held  Creatinine, serum  (enoxaparin (LOVENOX)    CrCl >/= 30 ml/min)  Weekly,   R    Comments: while on enoxaparin therapy    Signed and Held          Vitals/Pain Today's Vitals   12/12/18 1250 12/12/18 1500 12/12/18 1530 12/12/18 1600  BP:  (!) 70/59 100/84 117/67  Pulse:  80 78 80  Resp:    14  Temp:      TempSrc:      SpO2:  96% 96% 94%  Weight:      Height:      PainSc: 3        Isolation Precautions No active isolations  Medications Medications  lidocaine-prilocaine (EMLA) cream ( Topical Given 12/12/18 1552)  bupivacaine (MARCAINE) 0.5 % injection 30 mL (30 mLs Infiltration Given by Other 12/12/18 1627)  Ampicillin-Sulbactam (UNASYN) 3 g in sodium chloride 0.9 % 100 mL IVPB (0 g Intravenous Stopped 12/12/18 1722)  iohexol (OMNIPAQUE) 300 MG/ML solution 75 mL (75 mLs Intravenous Contrast Given 12/12/18 1718)  oxyCODONE-acetaminophen (PERCOCET/ROXICET) 5-325 MG per tablet 1 tablet (1 tablet Oral Given 12/12/18 1706)    Mobility walks Low fall risk   Focused Assessments Cellulitis    R Recommendations: See Admitting Provider Note  Report given to:   Additional Notes:

## 2018-12-12 NOTE — ED Provider Notes (Signed)
[facility] Emergency Department Provider Note    First MD Initiated Contact with Patient 12/12/18 1459     (approximate)  I have reviewed the triage vital signs and the nursing notes.   HISTORY  Chief Complaint Abscess    HPI Jillian [name]   the below listed past medical history presents the ER for 48 hours of progressively worsening left jaw pain swelling erythema and now streaking down her neck and having pain and discomfort under her jaw.  Was started on doxycycline after tele-med visit yesterday.  Has not had any improvement so came to the ER today.  Denies any trauma.  Has had recent evaluation at the dentist which was reassuring.   Past Medical History:  Diagnosis Date  . Cancer ([place])    basal cell 2016   Family History  Problem Relation Age of Onset  . Cancer Father        colon  . Hyperlipidemia Father   . Hypertension Father   . Diabetes Sister   . Hypertension Sister   . Cancer Paternal Aunt        colon  . Cancer Maternal Grandmother        colon  . Hypertension Maternal Grandmother   . Anuerysm Paternal Grandfather    Past Surgical History:  Procedure Laterality Date  . HERNIA REPAIR     Patient Active Problem List   Diagnosis Date Noted  . Abscess 12/12/2018  . Vagina itching 07/14/2018  . Ear discharge, bilateral 07/14/2018  . Weight gain 07/14/2018  . Class 1 obesity due to excess calories without serious comorbidity with body mass index (BMI) of 33.0 to 33.9 in adult 05/13/2018  . Prediabetes 12/03/2017  . Snoring 02/27/2016  . Non-restorative sleep 02/27/2016  . Vitamin D deficiency 10/28/2015  . Generalized anxiety disorder 11/25/2012  . HYPERTRIGLYCERIDEMIA 12/20/2008  . MENOPAUSE, EARLY 10/04/2008  . Fibromyalgia 10/04/2008      Prior to Admission medications   Medication Sig Start Date End Date Taking? Authorizing Provider  Alpha-Lipoic Acid 600 MG TABS Take 600 mg by mouth daily.     Yes [provider]  buPROPion (WELLBUTRIN XL) 150 MG 24 hr tablet TAKE 1 TABLET(150 MG) BY MOUTH DAILY Patient taking differently: Take 150 mg by mouth daily.  06/20/18  Yes Bedsole, Amy E, MD  cholecalciferol (VITAMIN D) 400 units TABS tablet Take 400 Units by mouth 2 (two) times daily.   Yes [provider]  Coenzyme Q10 (CO Q 10) 100 MG CAPS Take 1 capsule by mouth daily.   Yes [provider]  DULoxetine (CYMBALTA) 30 MG capsule Take 90 mg by mouth daily.   Yes [provider]  fish oil-omega-3 fatty acids 1000 MG capsule Take 3,000 mg by mouth daily.    Yes [provider]  Magnesium Malate 1250 (141.7 Mg) MG TABS Take 1-2 tablets by mouth See admin instructions. Take 1 tablet (141.7mg ) by mouth every morning and take 2 tablets (283.4mg ) by mouth every evening   Yes [provider]  methocarbamol (ROBAXIN) 500 MG tablet TAKE 1 TABLET BY MOUTH THREE TIMES DAILY Patient taking differently: Take 500 mg by mouth 3 (three) times daily as needed for muscle spasms.  08/02/18  Yes Bedsole, Amy E, MD  pregabalin (LYRICA) 150 MG capsule TAKE 1 CAPSULE BY MOUTH THREE TIMES DAILY Patient taking differently: Take 150 mg by mouth 3 (three) times daily.  10/18/18  Yes Bedsole, Amy  E, MD  traMADol (ULTRAM) 50 MG tablet Take two tablets by mouth three times a day. Patient taking differently: Take 100 mg by mouth 3 (three) times daily.  12/06/18  Yes Bedsole, Amy E, MD  Turmeric 500 MG CAPS Take 1,000 mg by mouth 2 (two) times daily.    Yes [provider]    Allergies Patient has no known allergies.    Social History Social History   Tobacco Use  . Smoking status: Former Smoker    Packs/day: 1.00    Years: 15.00    Pack years: 15.00    Types: Cigarettes    Quit date: 03/30/1992    Years since quitting: 26.7  . Smokeless tobacco: Never Used  Substance Use Topics  . Alcohol use: No  . Drug use: No    Review of Systems Patient denies  headaches, rhinorrhea, blurry vision, numbness, shortness of breath, chest pain, edema, cough, abdominal pain, nausea, vomiting, diarrhea, dysuria, fevers, rashes or hallucinations unless otherwise stated above in HPI. ____________________________________________   PHYSICAL EXAM:  VITAL SIGNS: Vitals:   12/12/18 1530 12/12/18 1600  BP: 100/84 117/67  Pulse: 78 80  Resp:  14  Temp:    SpO2: 96% 94%    Constitutional: Alert and oriented.  Eyes: Conjunctivae are normal.  Head: Atraumatic. Nose: No congestion/rhinnorhea. Mouth/Throat: Mucous membranes are moist.  No sublingual fullness but there is tenderness crossing midline under the jaw.  There is a fluctuant tender abscess of the left jawline with erythema streaking down the neck.  There is no trismus.  No intraoral lesion. Neck: No stridor. Painless ROM.  Cardiovascular: Normal rate, regular rhythm. Grossly normal heart sounds.  Good peripheral circulation. Respiratory: Normal respiratory effort.  No retractions. Lungs CTAB. Gastrointestinal: Soft and nontender. No distention. No abdominal bruits. No CVA tenderness. Genitourinary:  Musculoskeletal: No lower extremity tenderness nor edema.  No joint effusions. Neurologic:  Normal speech and language. No gross focal neurologic deficits are appreciated. No facial droop Skin:  Skin is warm, dry and intact. No rash noted. Psychiatric: Mood and affect are normal. Speech and behavior are normal.  ____________________________________________   LABS (all labs ordered are listed, but only abnormal results are displayed)  Results for orders placed or performed during the hospital encounter of 12/12/18 (from the past 24 hour(s))  Lactic acid, plasma     Status: None   Collection Time: 12/12/18 12:54 PM  Result Value Ref Range   Lactic Acid, Venous 0.7 0.5 - 1.9 mmol/L  Comprehensive metabolic panel     Status: None   Collection Time: 12/12/18 12:54 PM  Result Value Ref Range   Sodium  136 135 - 145 mmol/L   Potassium 4.4 3.5 - 5.1 mmol/L   Chloride 104 98 - 111 mmol/L   CO2 24 22 - 32 mmol/L   Glucose, Bld 97 70 - 99 mg/dL   BUN 22 8 - 23 mg/dL   Creatinine, Ser 0.83 0.44 - 1.00 mg/dL   Calcium 9.4 8.9 - 10.3 mg/dL   Total Protein 6.7 6.5 - 8.1 g/dL   Albumin 4.0 3.5 - 5.0 g/dL   AST 22 15 - 41 U/L   ALT 36 0 - 44 U/L   Alkaline Phosphatase 90 38 - 126 U/L   Total Bilirubin 0.6 0.3 - 1.2 mg/dL   GFR calc non Af Amer >60 >60 mL/min   GFR calc Af Amer >60 >60 mL/min   Anion gap 8 5 - 15  CBC with Differential  Status: Abnormal   Collection Time: 12/12/18 12:54 PM  Result Value Ref Range   WBC 10.9 (H) 4.0 - 10.5 K/uL   RBC 4.14 3.87 - 5.11 MIL/uL   Hemoglobin 12.8 12.0 - 15.0 g/dL   HCT 38.5 36.0 - 46.0 %   MCV 93.0 80.0 - 100.0 fL   MCH 30.9 26.0 - 34.0 pg   MCHC 33.2 30.0 - 36.0 g/dL   RDW 12.2 11.5 - 15.5 %   Platelets 205 150 - 400 K/uL   nRBC 0.0 0.0 - 0.2 %   Neutrophils Relative % 71 %   Neutro Abs 7.8 (H) 1.7 - 7.7 K/uL   Lymphocytes Relative 16 %   Lymphs Abs 1.7 0.7 - 4.0 K/uL   Monocytes Relative 10 %   Monocytes Absolute 1.1 (H) 0.1 - 1.0 K/uL   Eosinophils Relative 1 %   Eosinophils Absolute 0.1 0.0 - 0.5 K/uL   Basophils Relative 1 %   Basophils Absolute 0.1 0.0 - 0.1 K/uL   Immature Granulocytes 1 %   Abs Immature Granulocytes 0.05 0.00 - 0.07 K/uL   ____________________________________________ ____________________________________________  RADIOLOGY  EMERGENCY DEPARTMENT US SOFT TISSUE INTERPRETATION "Study: Limited Soft Tissue Ultrasound"  INDICATIONS: Soft tissue infection Multiple views of the body part were obtained in real-time with a multi-frequency linear probe  PERFORMED BY: Myself IMAGES ARCHIVED?: No SIDE:Left BODY PART:Neck INTERPRETATION:  Abcess present and Cellulitis present    ____________________________________________   PROCEDURES  Procedure(s) performed:  Marland KitchenMarland KitchenIncision and Drainage  Date/Time:  12/12/2018 4:38 PM Performed by: Merlyn Lot, MD Authorized by: Merlyn Lot, MD   Consent:    Consent obtained:  Verbal   Consent given by:  Patient   Risks discussed:  Bleeding, infection, incomplete drainage and pain   Alternatives discussed:  Alternative treatment, delayed treatment and observation Location:    Type:  Abscess Anesthesia (see MAR for exact dosages):    Anesthesia method:  Local infiltration and topical application   Local anesthetic:  Lidocaine 1% w/o epi and bupivacaine 0.5% w/o epi Procedure type:    Complexity:  Complex Procedure details:    Incision types:  Single straight   Incision depth:  Subcutaneous   Scalpel blade:  11   Wound management:  Probed and deloculated   Drainage:  Purulent and bloody   Drainage amount:  Moderate   Wound treatment:  Wound left open Post-procedure details:    Patient tolerance of procedure:  Tolerated well, no immediate complications      Critical Care performed: no ____________________________________________   INITIAL IMPRESSION / ASSESSMENT AND PLAN / ED COURSE  Pertinent labs & imaging results that were available during my care of the patient were reviewed by me and considered in my medical decision making (see chart for details).   DDX: Abscess, cellulitis, Ludwig's, parotitis  ZOWIE HOFFMEYER is a 63 y.o. who presents to the ED with symptoms as described above.  Does have obvious source of cellulitic changes in the left jaw abscess which was I&D as described above with purulent drainage.  She is currently protecting her airway but does have some mandibular firmness with cellulitic changes concerning for early Ludwig's.  Patient will be given IV Unasyn.  She is already on antibiotics and given concern for early Ludwig's will discuss with hospitalist for admission.     The patient was evaluated in Emergency Department today for the symptoms described in the history of present illness. He/she was  evaluated in the context of the global  COVID-19 pandemic, which necessitated consideration that the patient might be at risk for infection with the SARS-CoV-2 virus that causes COVID-19. Institutional protocols and algorithms that pertain to the evaluation of patients at risk for COVID-19 are in a state of rapid change based on information released by regulatory bodies including the CDC and federal and state organizations. These policies and algorithms were followed during the patient's care in the ED.  As part of my medical decision making, I reviewed the following data within the [facility] notes reviewed and incorporated, Labs reviewed, notes from prior ED visits and [place] Controlled Substance Database   ____________________________________________   FINAL CLINICAL IMPRESSION(S) / ED DIAGNOSES  Final diagnoses:  Abscess  Cellulitis of neck      NEW MEDICATIONS STARTED DURING THIS VISIT:  New Prescriptions   No medications on file     Note:  This document was prepared using Dragon voice recognition software and may include unintentional dictation errors.    Merlyn Lot, MD 12/12/18 1940

## 2018-12-12 NOTE — ED Triage Notes (Signed)
Pt here for abscess to left jaw.  Was started on abx yesterday AM so has had over 24 hrs now of abx with increasing redness.  Swelling to left jaw and redness expanding from lower jaw to left cheek and down to neck and just above suprasternal notch.  No fever in triage.  Has been using motrin/tylenol; last dose 6 AM.

## 2018-12-12 NOTE — H&P (Signed)
[facility] NAME: Jillian [name]    MR#:  [id number]  DATE OF BIRTH:  25-Mar-1956  DATE OF ADMISSION:  12/12/2018  PRIMARY CARE PHYSICIAN: Jinny Sanders, MD   REQUESTING/REFERRING PHYSICIAN: dr Quentin Cornwall  CHIEF COMPLAINT:   Jaw abscess HISTORY OF PRESENT ILLNESS:  Jillian [name]  is a 63 y.o. female with a known history of depression who presents today to the emergency room due to concern of jaw abscess. Patient reports few days ago she noticed pimple on her jaw over the past several days it had gotten worse.  She called tele-doc yesterday and they prescribed her doxycycline.  She took 3 doses however she notices that the abscess and redness has increased. Emergency physician incised and drained the abscess. Patient does report that she may have had an ingrown hair at that spot.  PAST MEDICAL HISTORY:   Past Medical History:  Diagnosis Date  . Cancer ([place])    basal cell 2016    PAST SURGICAL HISTORY:   Past Surgical History:  Procedure Laterality Date  . HERNIA REPAIR      SOCIAL HISTORY:   Social History   Tobacco Use  . Smoking status: Former Smoker    Packs/day: 1.00    Years: 15.00    Pack years: 15.00    Types: Cigarettes    Quit date: 03/30/1992    Years since quitting: 26.7  . Smokeless tobacco: Never Used  Substance Use Topics  . Alcohol use: No    FAMILY HISTORY:   Family History  Problem Relation Age of Onset  . Cancer Father        colon  . Hyperlipidemia Father   . Hypertension Father   . Diabetes Sister   . Hypertension Sister   . Cancer Paternal Aunt        colon  . Cancer Maternal Grandmother        colon  . Hypertension Maternal Grandmother   . Anuerysm Paternal Grandfather     DRUG ALLERGIES:  No Known Allergies  REVIEW OF SYSTEMS:   Review of Systems  Constitutional: Negative.  Negative for chills, fever and malaise/fatigue.  HENT: Negative.  Negative for ear discharge, ear  pain, hearing loss, nosebleeds and sore throat.   Eyes: Negative.  Negative for blurred vision and pain.  Respiratory: Negative.  Negative for cough, hemoptysis, shortness of breath and wheezing.   Cardiovascular: Negative.  Negative for chest pain, palpitations and leg swelling.  Gastrointestinal: Negative.  Negative for abdominal pain, blood in stool, diarrhea, nausea and vomiting.  Genitourinary: Negative.  Negative for dysuria.  Musculoskeletal: Negative.  Negative for back pain.  Skin:       Jaw abscess left side  Neurological: Negative for dizziness, tremors, speech change, focal weakness, seizures and headaches.  Endo/Heme/Allergies: Negative.  Does not bruise/bleed easily.  Psychiatric/Behavioral: Negative.  Negative for depression, hallucinations and suicidal ideas.    MEDICATIONS AT HOME:   Prior to Admission medications   Medication Sig Start Date End Date Taking? Authorizing Provider  Alpha-Lipoic Acid 600 MG TABS Take 600 mg by mouth daily.    Yes [provider]  buPROPion (WELLBUTRIN XL) 150 MG 24 hr tablet TAKE 1 TABLET(150 MG) BY MOUTH DAILY Patient taking differently: Take 150 mg by mouth daily.  06/20/18  Yes Bedsole, Amy E, MD  cholecalciferol (VITAMIN D) 400 units TABS tablet Take 400 Units by mouth 2 (two) times daily.   Yes [provider]  Coenzyme Q10 (CO Q 10) 100 MG CAPS Take 1 capsule by mouth daily.   Yes [provider]  DULoxetine (CYMBALTA) 30 MG capsule Take 90 mg by mouth daily.   Yes [provider]  fish oil-omega-3 fatty acids 1000 MG capsule Take 3,000 mg by mouth daily.    Yes [provider]  Magnesium Malate 1250 (141.7 Mg) MG TABS Take 1-2 tablets by mouth See admin instructions. Take 1 tablet (141.7mg ) by mouth every morning and take 2 tablets (283.4mg ) by mouth every evening   Yes [provider]  methocarbamol (ROBAXIN) 500 MG tablet TAKE 1 TABLET BY MOUTH THREE TIMES DAILY Patient taking  differently: Take 500 mg by mouth 3 (three) times daily as needed for muscle spasms.  08/02/18  Yes Bedsole, Amy E, MD  pregabalin (LYRICA) 150 MG capsule TAKE 1 CAPSULE BY MOUTH THREE TIMES DAILY Patient taking differently: Take 150 mg by mouth 3 (three) times daily.  10/18/18  Yes Bedsole, Amy E, MD  traMADol (ULTRAM) 50 MG tablet Take two tablets by mouth three times a day. Patient taking differently: Take 100 mg by mouth 3 (three) times daily.  12/06/18  Yes Bedsole, Amy E, MD  Turmeric 500 MG CAPS Take 1,000 mg by mouth 2 (two) times daily.    Yes [provider]      VITAL SIGNS:  Blood pressure 117/67, pulse 80, temperature 98.2 F (36.8 C), temperature source Oral, resp. rate 14, height 5\' 8"  (1.727 m), weight 100.7 kg, SpO2 94 %.  PHYSICAL EXAMINATION:   Physical Exam Constitutional:      General: She is not in acute distress. HENT:     Head: Normocephalic.     Mouth/Throat:     Mouth: Mucous membranes are moist.  Eyes:     General: No scleral icterus. Neck:     Musculoskeletal: Normal range of motion and neck supple.     Vascular: No JVD.     Trachea: No tracheal deviation.  Cardiovascular:     Rate and Rhythm: Normal rate and regular rhythm.     Heart sounds: Normal heart sounds. No murmur. No friction rub. No gallop.   Pulmonary:     Effort: Pulmonary effort is normal. No respiratory distress.     Breath sounds: Normal breath sounds. No wheezing or rales.  Chest:     Chest wall: No tenderness.  Abdominal:     General: Bowel sounds are normal. There is no distension.     Palpations: Abdomen is soft. There is no mass.     Tenderness: There is no abdominal tenderness. There is no guarding or rebound.  Musculoskeletal: Normal range of motion.  Skin:    General: Skin is warm.     Findings: Erythema present. No rash.     Comments: Status post I&D left jaw abscess  Neurological:     General: No focal deficit present.     Mental Status: She is alert and oriented  to person, place, and time. Mental status is at baseline.  Psychiatric:        Judgment: Judgment normal.       LABORATORY PANEL:   CBC Recent Labs  Lab 12/12/18 1254  WBC 10.9*  HGB 12.8  HCT 38.5  PLT 205   ------------------------------------------------------------------------------------------------------------------  Chemistries  Recent Labs  Lab 12/12/18 1254  NA 136  K 4.4  CL 104  CO2 24  GLUCOSE 97  BUN 22  CREATININE 0.83  CALCIUM 9.4  AST 22  ALT 36  ALKPHOS 90  BILITOT 0.6   ------------------------------------------------------------------------------------------------------------------  Cardiac Enzymes No results for input(s): TROPONINI in the last 168 hours. ------------------------------------------------------------------------------------------------------------------  RADIOLOGY:  No results found.  EKG:  No orders found for this or any previous visit.  IMPRESSION AND PLAN:   63 year old female with depression who presents to the emergency room due to increasing pain and swelling of her left jaw.  1.  Left jaw abscess status post I&D by ER physician with cellulitis Follow-up on CT scan Continue Unasyn   2.  Depression: Continue Wellbutrin and duloxetine   All the records are reviewed and case discussed with ED provider. Management plans discussed with the patient and she is in agreement  CODE STATUS: FULL  TOTAL TIME TAKING CARE OF THIS PATIENT: 45 minutes.    Bettey Costa M.D on 12/12/2018 at 5:28 PM  Between 7am to 6pm - Pager - [phone]  After 6pm go to [email or web address] - password [email or web address]  Office  [phone]  CC: Primary care physician; Jinny Sanders, MD

## 2018-12-13 ENCOUNTER — Encounter: Payer: PRIVATE HEALTH INSURANCE | Admitting: Family Medicine

## 2018-12-13 DIAGNOSIS — L0291 Cutaneous abscess, unspecified: Secondary | ICD-10-CM

## 2018-12-13 DIAGNOSIS — L03221 Cellulitis of neck: Secondary | ICD-10-CM

## 2018-12-13 LAB — SARS CORONAVIRUS 2 (TAT 6-24 HRS): SARS Coronavirus 2: NEGATIVE

## 2018-12-13 MED ORDER — VANCOMYCIN HCL 10 G IV SOLR
2000.0000 mg | Freq: Once | INTRAVENOUS | Status: AC
  Administered 2018-12-13: 2000 mg via INTRAVENOUS
  Filled 2018-12-13: qty 2000

## 2018-12-13 MED ORDER — VANCOMYCIN HCL IN DEXTROSE 750-5 MG/150ML-% IV SOLN
750.0000 mg | Freq: Two times a day (BID) | INTRAVENOUS | Status: DC
  Administered 2018-12-14 (×3): 750 mg via INTRAVENOUS
  Filled 2018-12-13 (×6): qty 150

## 2018-12-13 MED ORDER — SODIUM CHLORIDE 0.9 % IV SOLN
INTRAVENOUS | Status: DC | PRN
  Administered 2018-12-13: 04:00:00 via INTRAVENOUS

## 2018-12-13 MED ORDER — LIDOCAINE HCL (PF) 1 % IJ SOLN
5.0000 mL | Freq: Once | INTRAMUSCULAR | Status: AC
  Administered 2018-12-13: 5 mL via SUBCUTANEOUS
  Filled 2018-12-13: qty 5

## 2018-12-13 NOTE — Plan of Care (Signed)

## 2018-12-13 NOTE — Consult Note (Addendum)
[name] SURGICAL CONSULTATION NOTE (initial) - [id number]   HISTORY OF PRESENT ILLNESS (HPI):  63 y.o. female presented to [facility] ED yesterday (09/14) for evaluation of left jaw pain. Patient reported 48 hours of worsening left jaw pain with associated swelling and erythema to that area. This has begun to streak down her neck as well. She had a tele-medicine visit and was started on PO doxycycline. However, she continued to worsen which prompted ED visit. She denied any associated fevers, chills, trouble swallowing, voice changes, cough, or difficulty breathing. No previous trauma to that area. No history of similar. Work up in the ED was concerning for very mild leukocytosis and cellulitis with abscess to the left jaw. An I&D was preformed by the ED physician. She also underwent CT scan which showed superficial soft tissue inflammation without abscess., She was admitted to the medicine service for IV Abx and monitoring.   Surgery is consulted by hosptiailst physician Dr. Posey Pronto, MD in this context for evaluation and management of left jaw cellulitis and possible abscess.   PAST MEDICAL HISTORY (PMH):  Past Medical History:  Diagnosis Date  . Cancer ([place])    basal cell 2016     PAST SURGICAL HISTORY [facility]):  Past Surgical History:  Procedure Laterality Date  . HERNIA REPAIR       MEDICATIONS:  Prior to Admission medications   Medication Sig Start Date End Date Taking? Authorizing Provider  Alpha-Lipoic Acid 600 MG TABS Take 600 mg by mouth daily.    Yes [provider]  buPROPion (WELLBUTRIN XL) 150 MG 24 hr tablet TAKE 1 TABLET(150 MG) BY MOUTH DAILY Patient taking differently: Take 150 mg by mouth daily.  06/20/18  Yes Bedsole, Amy E, MD  cholecalciferol (VITAMIN D) 400 units TABS tablet Take 400 Units by mouth 2 (two) times daily.   Yes [provider]  Coenzyme Q10 (CO Q 10) 100 MG CAPS Take 1 capsule by mouth daily.   Yes [provider]   DULoxetine (CYMBALTA) 30 MG capsule Take 90 mg by mouth daily.   Yes [provider]  fish oil-omega-3 fatty acids 1000 MG capsule Take 3,000 mg by mouth daily.    Yes [provider]  Magnesium Malate 1250 (141.7 Mg) MG TABS Take 1-2 tablets by mouth See admin instructions. Take 1 tablet (141.7mg ) by mouth every morning and take 2 tablets (283.4mg ) by mouth every evening   Yes [provider]  methocarbamol (ROBAXIN) 500 MG tablet TAKE 1 TABLET BY MOUTH THREE TIMES DAILY Patient taking differently: Take 500 mg by mouth 3 (three) times daily as needed for muscle spasms.  08/02/18  Yes Bedsole, Amy E, MD  pregabalin (LYRICA) 150 MG capsule TAKE 1 CAPSULE BY MOUTH THREE TIMES DAILY Patient taking differently: Take 150 mg by mouth 3 (three) times daily.  10/18/18  Yes Bedsole, Amy E, MD  traMADol (ULTRAM) 50 MG tablet Take two tablets by mouth three times a day. Patient taking differently: Take 100 mg by mouth 3 (three) times daily.  12/06/18  Yes Bedsole, Amy E, MD  Turmeric 500 MG CAPS Take 1,000 mg by mouth 2 (two) times daily.    Yes [provider]     ALLERGIES:  No Known Allergies   SOCIAL HISTORY:  Social History   Socioeconomic History  . Marital status: Married    Spouse name: Not on file  . Number of children: 2  . Years of education: Not on file  .  Highest education level: Not on file  Occupational History  . Occupation: Therapist, sports  Social Needs  . Financial resource strain: Not on file  . Food insecurity    Worry: Not on file    Inability: Not on file  . Transportation needs    Medical: Not on file    Non-medical: Not on file  Tobacco Use  . Smoking status: Former Smoker    Packs/day: 1.00    Years: 15.00    Pack years: 15.00    Types: Cigarettes    Quit date: 03/30/1992    Years since quitting: 26.7  . Smokeless tobacco: Never Used  Substance and Sexual Activity  . Alcohol use: No  . Drug use: No  . Sexual activity: Not on file   Lifestyle  . Physical activity    Days per week: Not on file    Minutes per session: Not on file  . Stress: Not on file  Relationships  . Social Herbalist on phone: Not on file    Gets together: Not on file    Attends religious service: Not on file    Active member of club or organization: Not on file    Attends meetings of clubs or organizations: Not on file    Relationship status: Not on file  . Intimate partner violence    Fear of current or ex partner: Not on file    Emotionally abused: Not on file    Physically abused: Not on file    Forced sexual activity: Not on file  Other Topics Concern  . Not on file  Social History Narrative   Regular exercise-yes, but seasonal walks and swims   Diet: healthy, low carbohydrates, no caffeine     FAMILY HISTORY:  Family History  Problem Relation Age of Onset  . Cancer Father        colon  . Hyperlipidemia Father   . Hypertension Father   . Diabetes Sister   . Hypertension Sister   . Cancer Paternal Aunt        colon  . Cancer Maternal Grandmother        colon  . Hypertension Maternal Grandmother   . Anuerysm Paternal Grandfather       REVIEW OF SYSTEMS:  Review of Systems  Constitutional: Negative for chills and fever.  HENT: Negative for sore throat.   Respiratory: Negative for cough, shortness of breath and stridor.   Cardiovascular: Negative for chest pain and palpitations.  Gastrointestinal: Negative for abdominal pain, nausea and vomiting.  Skin:       + Cellulitis (Left Jaw)  Neurological: Negative for dizziness and headaches.  All other systems reviewed and are negative.   VITAL SIGNS:  Temp:  [98 F (36.7 C)-98.4 F (36.9 C)] 98 F (36.7 C) (09/15 0400) Pulse Rate:  [78-94] 92 (09/15 0400) Resp:  [14-19] 15 (09/15 0400) BP: (70-141)/(53-84) 108/53 (09/15 0400) SpO2:  [94 %-98 %] 96 % (09/15 0400) Weight:  [100.4 kg-100.7 kg] 100.4 kg (09/14 2017)     Height: 5\' 8"  (172.7 cm) Weight: 100.4  kg BMI (Calculated): 33.66   INTAKE/OUTPUT:  09/14 0701 - 09/15 0700 In: 106.3 [IV Piggyback:106.3] Out: -   PHYSICAL EXAM:  Physical Exam Vitals signs and nursing note reviewed.  Constitutional:      General: She is not in acute distress.    Appearance: Normal appearance. She is obese. She is not ill-appearing or toxic-appearing.  HENT:     Head:  Normocephalic and atraumatic.      Comments: No airway compromise    Mouth/Throat:     Mouth: Mucous membranes are moist. No angioedema.     Pharynx: Oropharynx is clear. No pharyngeal swelling.  Cardiovascular:     Rate and Rhythm: Normal rate and regular rhythm.     Pulses: Normal pulses.     Heart sounds: No murmur. No friction rub. No gallop.   Pulmonary:     Effort: Pulmonary effort is normal. No respiratory distress.     Breath sounds: No stridor. No wheezing.  Genitourinary:    Comments: Deferred Musculoskeletal:     Right lower leg: No edema.     Left lower leg: No edema.  Skin:    Findings: Erythema present.       Neurological:     General: No focal deficit present.     Mental Status: She is alert and oriented to person, place, and time.  Psychiatric:        Mood and Affect: Mood normal.        Behavior: Behavior normal.     Left Jaw (12/13/2018 - 11:09 AM):        Labs:  CBC Latest Ref Rng & Units 12/12/2018 10/18/2015  WBC 4.0 - 10.5 K/uL 10.9(H) 4.8  Hemoglobin 12.0 - 15.0 g/dL 12.8 13.3  Hematocrit 36.0 - 46.0 % 38.5 39.4  Platelets 150 - 400 K/uL 205 210.0   CMP Latest Ref Rng & Units 12/12/2018 03/04/2018 11/30/2017  Glucose 70 - 99 mg/dL 97 120(H) 125(H)  BUN 8 - 23 mg/dL 22 19 16   Creatinine 0.44 - 1.00 mg/dL 0.83 0.96 0.98  Sodium 135 - 145 mmol/L 136 139 139  Potassium 3.5 - 5.1 mmol/L 4.4 4.4 4.3  Chloride 98 - 111 mmol/L 104 103 105  CO2 22 - 32 mmol/L 24 29 26   Calcium 8.9 - 10.3 mg/dL 9.4 10.2 10.3  Total Protein 6.5 - 8.1 g/dL 6.7 6.5 6.6  Total Bilirubin 0.3 - 1.2 mg/dL 0.6 0.4 0.3   Alkaline Phos 38 - 126 U/L 90 65 62  AST 15 - 41 U/L 22 17 16   ALT 0 - 44 U/L 36 25 23     Imaging studies:   CT Neck Soft Tissue (12/12/2018) personally reviewed showing soft tissue swelling of the left jaw without appreciable fluid collection/abscess, and radiologist report reviewed below:  IMPRESSION: Superficial inflammation in the left lower face compatible with cellulitis. No abscess.    PROCEDURES 12/13/18 11:03 AM  Procedure: Incision and Drainage (cpt: 10060)  Indication: Left Jaw Cellulitis/Abscess  Preforming Provider: Edison Simon, PA-C  Anesthesia: 23ml 1% Lidocaine, Intradermally  Description of the Procedure: Informed consent was obtained prior to starting. The area was prepped in typical standard fashion. 7ml of 1% Lidocaine was injected intradermally and adequate anesthesia was achieved. Kelly forceps were used through the patient's 2 cm incision to the left jaw from I&D in ED in attempt to break up any loculations. The wound was probed in all directions revealing primarily induration. Minimal serosanguinous drainage was appreciated. This was cultured. No purulence was expressible. The patient tolerated this well. All sharps were accounted for and disposed appropriately of at end of procedure.   Complications: None apparent  Plan: Follow up cultures, continue IV ABx, dry dressing as needed    Assessment/Plan: (ICD-10's: [id number]) 63 y.o. female with what appears to be cellulitic infection to the left jaw without appreciable abscess and no airway compromise at this time   -  Continue IV Abx (Unasyn + Vancomycin); Follow up BCx  - No further surgical intervention indication at this time  - If concern for early Ludwig's angina then I do think it is prudent for ENT consultation - Dr Posey Pronto d/w ENT and recommend attempt at additional I&D if amenable. Details of this above; follow up wound cultures  - Further management per primary service  All of the above  findings and recommendations were discussed with the patient, and all of patient's questions were answered to her expressed satisfaction.  Thank you for the opportunity to participate in this patient's care.   -- Edison Simon, PA-C [facility] 12/13/2018, 9:13 AM [phone] M-F: 7am - 4pm   I saw and evaluated the patient.  I agree with the above documentation, exam, and plan, which I have edited where appropriate. Fredirick Maudlin  3:48 PM

## 2018-12-13 NOTE — Progress Notes (Signed)
PT Cancellation Note  Patient Details Name: HEALY FURTAK MRN: [id number] DOB: [birth year]   Cancelled Treatment:    Reason Eval/Treat Not Completed: PT screened, no needs identified, will sign off(Consult received and chart reviewed.  Patient up ad lib in room upon entry; steady without balance, safety concerns noted.  Denies need for PT services, as mobility remains indep (baseline).  Will complete consult at this time.  Please re-consult should needs change.)   Dalyla Chui H. Owens Shark, PT, DPT, NCS 12/13/18, 10:26 AM [phone]

## 2018-12-13 NOTE — Consult Note (Signed)
Pharmacy Antibiotic Note  KD BORUCH is a 63 y.o. female admitted on 12/12/2018 with Jaw abscess.  Pharmacy has been consulted for Vancomycin  Dosing. Patient is also receiving Unasyn 3g IV every 6 hours.   Plan: Vancomycin 2000mg  IV x 1 loading dose. Start Vancomycin 750 mg IV Q 12 hrs. Goal AUC 400-550. Expected AUC: 489.2 SCr used: 0.84 Css: 14.7    Height: 5\' 8"  (172.7 cm) Weight: 221 lb 4.8 oz (100.4 kg) IBW/kg (Calculated) : 63.9  Temp (24hrs), Avg:98.2 F (36.8 C), Min:98 F (36.7 C), Max:98.4 F (36.9 C)  Recent Labs  Lab 12/12/18 1254  WBC 10.9*  CREATININE 0.83  LATICACIDVEN 0.7    Estimated Creatinine Clearance: 84.9 mL/min (by C-G formula based on SCr of 0.83 mg/dL).    No Known Allergies  Antimicrobials this admission: 9/14 Unasyn >>  9/15 Vancomycin  >>   Microbiology results: 9/14 BCx: pending  Thank you for allowing pharmacy to be a part of this patient's care.  Pernell Dupre, PharmD, BCPS Clinical Pharmacist 12/13/2018 12:33 PM

## 2018-12-13 NOTE — Progress Notes (Signed)
[facility]                                                                                                                                                                                  Patient Demographics   Jillian [name], is a 63 y.o. female, DOB - 21-Dec-1955, [id number]  Admit date - 12/12/2018   Admitting Physician Bettey Costa, MD  Outpatient Primary MD for the patient is Jinny Sanders, MD   LOS - 1  Subjective: Patient states that she is having pain in the left jaw.  Patient complains of pain in the left jaw and swelling    Review of Systems:   CONSTITUTIONAL: No documented fever. No fatigue, weakness. No weight gain, no weight loss.  EYES: No blurry or double vision.  ENT: No tinnitus. No postnasal drip. No redness of the oropharynx.  RESPIRATORY: No cough, no wheeze, no hemoptysis. No dyspnea.  CARDIOVASCULAR: No chest pain. No orthopnea. No palpitations. No syncope.  GASTROINTESTINAL: No nausea, no vomiting or diarrhea. No abdominal pain. No melena or hematochezia.  GENITOURINARY: No dysuria or hematuria.  ENDOCRINE: No polyuria or nocturia. No heat or cold intolerance.  HEMATOLOGY: No anemia. No bruising. No bleeding.  INTEGUMENTARY: Left jaw swelling MUSCULOSKELETAL: No arthritis. No swelling. No gout.  NEUROLOGIC: No numbness, tingling, or ataxia. No seizure-type activity.  PSYCHIATRIC: No anxiety. No insomnia. No ADD.    Vitals:   Vitals:   12/12/18 1600 12/12/18 1900 12/12/18 2017 12/13/18 0400  BP: 117/67 (!) 141/73 123/67 (!) 108/53  Pulse: 80 94 92 92  Resp: 14  15 15   Temp:   98.4 F (36.9 C) 98 F (36.7 C)  TempSrc:   Oral Oral  SpO2: 94% 96% 98% 96%  Weight:   100.4 kg   Height:   5\' 8"  (1.727 m)     Wt Readings from Last 3 Encounters:  12/12/18 100.4 kg  11/16/18 100 kg  11/02/18 98.1 kg     Intake/Output Summary (Last 24 hours) at 12/13/2018 1439 Last data filed at 12/13/2018 1255 Gross per 24  hour  Intake 732.75 ml  Output -  Net 732.75 ml    Physical Exam:   GENERAL: Pleasant-appearing in no apparent distress.  HEAD, EYES, EARS, NOSE AND THROAT: Atraumatic, normocephalic. Extraocular muscles are intact. Pupils equal and reactive to light. Sclerae anicteric. No conjunctival injection. No oro-pharyngeal erythema.  NECK: Supple. There is no jugular venous distention. No bruits, no lymphadenopathy, no thyromegaly.  HEART: Regular rate and rhythm,. No murmurs, no rubs, no clicks.  LUNGS: Clear to auscultation bilaterally. No rales or rhonchi. No wheezes.  ABDOMEN: Soft, flat, nontender,  nondistended. Has good bowel sounds. No hepatosplenomegaly appreciated.  EXTREMITIES: No evidence of any cyanosis, clubbing, or peripheral edema.  +2 pedal and radial pulses bilaterally.  NEUROLOGIC: The patient is alert, awake, and oriented x3 with no focal motor or sensory deficits appreciated bilaterally.  SKIN: Moist and warm with no rashes appreciated.  Psych: Not anxious, depressed LN: No inguinal LN enlargement    Antibiotics   Anti-infectives (From admission, onward)   Start     Dose/Rate Route Frequency Ordered Stop   12/14/18 0000  vancomycin (VANCOCIN) IVPB 750 mg/150 ml premix     750 mg 150 mL/hr over 60 Minutes Intravenous Every 12 hours 12/13/18 1229     12/13/18 0930  vancomycin (VANCOCIN) 2,000 mg in sodium chloride 0.9 % 500 mL IVPB     2,000 mg 250 mL/hr over 120 Minutes Intravenous  Once 12/13/18 0859 12/13/18 1255   12/12/18 2230  Ampicillin-Sulbactam (UNASYN) 3 g in sodium chloride 0.9 % 100 mL IVPB     3 g 200 mL/hr over 30 Minutes Intravenous Every 6 hours 12/12/18 2031     12/12/18 1530  Ampicillin-Sulbactam (UNASYN) 3 g in sodium chloride 0.9 % 100 mL IVPB     3 g 200 mL/hr over 30 Minutes Intravenous  Once 12/12/18 1520 12/12/18 1722      Medications   Scheduled Meds: . buPROPion  150 mg Oral Daily  . cholecalciferol  400 Units Oral BID  . DULoxetine  90  mg Oral Daily  . enoxaparin (LOVENOX) injection  40 mg Subcutaneous Q24H  . omega-3 acid ethyl esters  3,000 mg Oral Daily  . pregabalin  150 mg Oral TID  . traMADol  100 mg Oral TID   Continuous Infusions: . sodium chloride Stopped (12/13/18 1255)  . ampicillin-sulbactam (UNASYN) IV 3 g (12/13/18 1254)  . [START ON 12/14/2018] vancomycin     PRN Meds:.sodium chloride, acetaminophen **OR** acetaminophen, HYDROcodone-acetaminophen, methocarbamol, ondansetron **OR** ondansetron (ZOFRAN) IV, polyethylene glycol   Data Review:   Micro Results Recent Results (from the past 240 hour(s))  SARS CORONAVIRUS 2 (TAT 6-24 HRS) Nasopharyngeal Nasopharyngeal Swab     Status: None   Collection Time: 12/12/18  3:54 PM   Specimen: Nasopharyngeal Swab  Result Value Ref Range Status   SARS Coronavirus 2 NEGATIVE NEGATIVE Final    Comment: (NOTE) SARS-CoV-2 target nucleic acids are NOT DETECTED. The SARS-CoV-2 RNA is generally detectable in upper and lower respiratory specimens during the acute phase of infection. Negative results do not preclude SARS-CoV-2 infection, do not rule out co-infections with other pathogens, and should not be used as the sole basis for treatment or other patient management decisions. Negative results must be combined with clinical observations, patient history, and epidemiological information. The expected result is Negative. Fact Sheet for Patients: SugarRoll.be Fact Sheet for Healthcare Providers: https://www.woods-mathews.com/ This test is not yet approved or cleared by the [place] FDA and  has been authorized for detection and/or diagnosis of SARS-CoV-2 by FDA under an Emergency Use Authorization (EUA). This EUA will remain  in effect (meaning this test can be used) for the duration of the COVID-19 declaration under Section 56 4(b)(1) of the Act, 21 U.S.C. section 360bbb-3(b)(1), unless the authorization is terminated  or revoked sooner. Performed at [facility], [place] [address], [place], [place] [address]   Blood culture (routine x 2)     Status: None (Preliminary result)   Collection Time: 12/12/18  3:54 PM   Specimen: BLOOD  Result  Value Ref Range Status   Specimen Description BLOOD BLOOD RIGHT ARM  Final   Special Requests   Final    BOTTLES DRAWN AEROBIC AND ANAEROBIC Blood Culture adequate volume   Culture   Final    NO GROWTH < 24 HOURS Performed at [facility], [address] [place], [place] [address]    Report Status PENDING  Incomplete  Blood culture (routine x 2)     Status: None (Preliminary result)   Collection Time: 12/12/18  3:54 PM   Specimen: BLOOD  Result Value Ref Range Status   Specimen Description BLOOD BLOOD RIGHT HAND  Final   Special Requests   Final    BOTTLES DRAWN AEROBIC AND ANAEROBIC Blood Culture adequate volume   Culture   Final    NO GROWTH < 24 HOURS Performed at [facility], [address], [place], [place] [address]    Report Status PENDING  Incomplete    Radiology Reports Ct Soft Tissue Neck W Contrast  Result Date: 12/12/2018 CLINICAL DATA:  Left jaw abscess. Increasing redness and swelling despite antibiotic treatment. EXAM: CT NECK WITH CONTRAST TECHNIQUE: Multidetector CT imaging of the neck was performed using the standard protocol following the bolus administration of intravenous contrast. CONTRAST:  34mL OMNIPAQUE IOHEXOL 300 MG/ML  SOLN COMPARISON:  None. FINDINGS: Pharynx and larynx: No pharyngeal mass or swelling is identified. The airway is patent. Salivary glands: No inflammation, mass, or stone. Thyroid: Unremarkable. Lymph nodes: Subcentimeter short axis left level I and II lymph nodes are slightly more prominent on the left and likely reactive. Vascular: Major vascular structures of the neck are patent. Limited intracranial: Unremarkable. Visualized orbits: Unremarkable. Mastoids and visualized paranasal sinuses: Mild  mucosal thickening in the left maxillary sinus. Clear mastoid air cells. Skeleton: Mild cervical spondylosis. No suspicious osseous lesion. Upper chest: Clear lung apices. Other: Inflammatory changes are present in the predominantly subcutaneous soft tissues of the left lower face overlying the mandibular body. No organized fluid collection is present, and there is no evidence of significant deep space inflammation. IMPRESSION: Superficial inflammation in the left lower face compatible with cellulitis. No abscess. Electronically Signed   By: Logan Bores M.D.   On: 12/12/2018 17:49   Mr Knee Right Wo Contrast  Result Date: 11/28/2018 CLINICAL DATA:  Progressive right knee pain and swelling EXAM: MRI OF THE RIGHT KNEE WITHOUT CONTRAST TECHNIQUE: Multiplanar, multisequence MR imaging of the knee was performed. No intravenous contrast was administered. COMPARISON:  X-ray 11/16/2018 FINDINGS: MENISCI Medial meniscus: Complex tear of the posterior horn and body segments with possible displaced fragment of meniscal tissue posterior to the PCL versus an irregular appearance of a meniscofemoral ligament. Lateral meniscus:  Intact. LIGAMENTS Cruciates:  Intact ACL and PCL. Collaterals: Intact MCL with periligamentous edema, which may be reactive secondary to adjacent medial compartment pathology. Lateral collateral ligament complex is intact. CARTILAGE Patellofemoral:  No discrete chondral defect. Medial: Extensive full-thickness cartilage loss of the weight-bearing medial femoral condyle (series 9, image 16). Lateral:  No chondral defect. Joint: Moderate joint effusion. Thickened medial plica. Edema within Hoffa's fat. Popliteal Fossa: Small Baker's cyst. Intermediate signal within the popliteus tendon attachment. Intramuscular edema within the visualized popliteus muscle. Extensor Mechanism: Intact quadriceps tendon and patellar tendon. Bidirectional patellar enthesophytes. Bones: Reactive subchondral marrow changes  involving the medial compartment. No fracture or bone lesion. Other: Moderate soft tissue edema surrounds the knee joint, predominantly posteriorly. IMPRESSION: 1. Extensive complex tearing of the posterior horn and body segments  of the medial meniscus with probable displaced flap of meniscal tissue posteriorly. 2. Extensive full-thickness cartilage loss of the weight-bearing medial femoral condyle with associated subchondral marrow changes. 3. Findings suggestive of a popliteus muscle strain and tendinosis. 4. Intact MCL with periligamentous edema which may reflect low-grade sprain versus reactive changes secondary to medial compartment pathology. 5. Moderate joint effusion, pericapsular edema, and small Baker's cyst. Electronically Signed   By: Davina Poke M.D.   On: 11/28/2018 12:49   Dg Knee 4 Views W/patella Right  Result Date: 11/16/2018 CLINICAL DATA:  63 year old presenting with a 6 week history of severe RIGHT knee pain. No known injuries. EXAM: RIGHT KNEE - COMPLETE 4+ VIEW COMPARISON:  None. FINDINGS: No evidence of acute, subacute or healed fractures. Moderate MEDIAL and patellofemoral compartment joint space narrowing. LATERAL compartment joint space well-preserved. Mild enthesopathic spurring at the insertion of the patellar tendon on the INFERIOR patella and the tibial tubercle. Mild enthesopathic spurring at the insertion of the quadriceps tendon on the SUPERIOR patella. Well-preserved bone mineral density. Small joint effusion. No evidence of a patellar tracking abnormality on the sunrise view. IMPRESSION: 1. Moderate MEDIAL and patellofemoral compartment osteoarthritis. 2. Small joint effusion. Electronically Signed   By: Evangeline Dakin M.D.   On: 11/16/2018 14:00     CBC Recent Labs  Lab 12/12/18 1254  WBC 10.9*  HGB 12.8  HCT 38.5  PLT 205  MCV 93.0  MCH 30.9  MCHC 33.2  RDW 12.2  LYMPHSABS 1.7  MONOABS 1.1*  EOSABS 0.1  BASOSABS 0.1    Chemistries  Recent Labs   Lab 12/12/18 1254  NA 136  K 4.4  CL 104  CO2 24  GLUCOSE 97  BUN 22  CREATININE 0.83  CALCIUM 9.4  AST 22  ALT 36  ALKPHOS 90  BILITOT 0.6   ------------------------------------------------------------------------------------------------------------------ estimated creatinine clearance is 84.9 mL/min (by C-G formula based on SCr of 0.83 mg/dL). ------------------------------------------------------------------------------------------------------------------ No results for input(s): HGBA1C in the last 72 hours. ------------------------------------------------------------------------------------------------------------------ No results for input(s): CHOL, HDL, LDLCALC, TRIG, CHOLHDL, LDLDIRECT in the last 72 hours. ------------------------------------------------------------------------------------------------------------------ No results for input(s): TSH, T4TOTAL, T3FREE, THYROIDAB in the last 72 hours.  Invalid input(s): FREET3 ------------------------------------------------------------------------------------------------------------------ No results for input(s): VITAMINB12, FOLATE, FERRITIN, TIBC, IRON, RETICCTPCT in the last 72 hours.  Coagulation profile No results for input(s): INR, PROTIME in the last 168 hours.  No results for input(s): DDIMER in the last 72 hours.  Cardiac Enzymes No results for input(s): CKMB, TROPONINI, MYOGLOBIN in the last 168 hours.  Invalid input(s): CK ------------------------------------------------------------------------------------------------------------------ Invalid input(s): POCBNP    Assessment & Plan   63 year old female with depression who presents to the emergency room due to increasing pain and swelling of her left jaw.  1.  Left jaw cellulitis with possible abscess although CT scan was negative for abscess. Appreciate surgical input and drainage of small amount of fluid not much was there Continue IV antibiotics I  have added vancomycin for MRSA coverage  2.  Depression: Continue Wellbutrin and duloxetine     Code Status Orders  (From admission, onward)         Start     Ordered   12/12/18 2032  Full code  Continuous     12/12/18 2031        Code Status History    This patient has a current code status but no historical code status.   Advance Care Planning Activity           Consults  surgery  DVT Prophylaxis  Lovenox  Lab Results  Component Value Date   PLT 205 12/12/2018     Time Spent in minutes 35 minutes greater than 50% of time spent in care coordination and counseling patient regarding the condition and plan of care.   Dustin Flock M.D on 12/13/2018 at 2:39 PM  Between 7am to 6pm - Pager - [phone]  After 6pm go to [email or web address] - Proofreader  [employer]   Office  [phone]

## 2018-12-14 LAB — BASIC METABOLIC PANEL
Anion gap: 5 (ref 5–15)
BUN: 18 mg/dL (ref 8–23)
CO2: 29 mmol/L (ref 22–32)
Calcium: 9.3 mg/dL (ref 8.9–10.3)
Chloride: 104 mmol/L (ref 98–111)
Creatinine, Ser: 0.83 mg/dL (ref 0.44–1.00)
GFR calc Af Amer: >60 mL/min (ref >60)
GFR calc non Af Amer: >60 mL/min (ref >60)
Glucose, Bld: 105 mg/dL — ABNORMAL HIGH (ref 70–99)
Potassium: 4.2 mmol/L (ref 3.5–5.1)
Sodium: 138 mmol/L (ref 135–145)

## 2018-12-14 LAB — CBC
HCT: 36 % (ref 36.0–46.0)
Hemoglobin: 11.9 g/dL — ABNORMAL LOW (ref 12.0–15.0)
MCH: 30.8 pg (ref 26.0–34.0)
MCHC: 33.1 g/dL (ref 30.0–36.0)
MCV: 93.3 fL (ref 80.0–100.0)
Platelets: 208 10*3/uL (ref 150–400)
RBC: 3.86 MIL/uL — ABNORMAL LOW (ref 3.87–5.11)
RDW: 11.9 % (ref 11.5–15.5)
WBC: 7 10*3/uL (ref 4.0–10.5)
nRBC: 0 % (ref 0.0–0.2)

## 2018-12-14 LAB — HIV ANTIBODY (ROUTINE TESTING W REFLEX): HIV Screen 4th Generation wRfx: NONREACTIVE

## 2018-12-14 NOTE — Progress Notes (Addendum)
[facility] SURGICAL PROGRESS NOTE (cpt [id number])  Hospital Day(s): 2.   Interval History: Patient seen and examined, no acute events or new complaints overnight. Patient reports that she is feeling much better this morning. Her swelling and induration to the left jaw have significantly improved. No further drainage from I&D site. Cultures still pending. No difficulty breathing, trouble swallowing, or airway compromise. Otherwise doing well.    Review of Systems:  Constitutional: denies fever, chills  HEENT: denies cough, congestion, trouble swalling  Respiratory: denies any shortness of breath  Cardiovascular: denies chest pain or palpitations  Gastrointestinal: denies N/V, or diarrhea/and bowel function as per interval history Integumentary: + left jaw cellulitis (improving)  Vital signs in last 24 hours: [min-max] current  Temp:  [97.4 F (36.3 C)-98.9 F (37.2 C)] 98.6 F (37 C) (09/16 0444) Pulse Rate:  [83-84] 84 (09/16 0444) Resp:  [15-18] 16 (09/16 0444) BP: (111-112)/(57-68) 111/62 (09/16 0444) SpO2:  [93 %-96 %] 93 % (09/16 0444)     Height: 5\' 8"  (172.7 cm) Weight: 100.4 kg BMI (Calculated): 33.66   Intake/Output last 2 shifts:  09/15 0701 - 09/16 0700 In: 1422.4 [P.O.:120; I.V.:86.5; IV Piggyback:1215.9] Out: -    Physical Exam:  Constitutional: alert, cooperative and no distress  HENT: Significantly improved erythema and swelling to the left jaw, there is still palpable induration but no fluctuance or crepitus, no airway compromise, previous I&D site is open, no drianage Respiratory: breathing non-labored at rest  Musculoskeletal: no edema or wounds, motor and sensation grossly intact, NT    Labs:  CBC Latest Ref Rng & Units 12/14/2018 12/12/2018 10/18/2015  WBC 4.0 - 10.5 K/uL 7.0 10.9(H) 4.8  Hemoglobin 12.0 - 15.0 g/dL 11.9(L) 12.8 13.3  Hematocrit 36.0 - 46.0 % 36.0 38.5 39.4  Platelets 150 - 400 K/uL 208 205 210.0   CMP Latest Ref Rng & Units  12/14/2018 12/12/2018 03/04/2018  Glucose 70 - 99 mg/dL 105(H) 97 120(H)  BUN 8 - 23 mg/dL 18 22 19   Creatinine 0.44 - 1.00 mg/dL 0.83 0.83 0.96  Sodium 135 - 145 mmol/L 138 136 139  Potassium 3.5 - 5.1 mmol/L 4.2 4.4 4.4  Chloride 98 - 111 mmol/L 104 104 103  CO2 22 - 32 mmol/L 29 24 29   Calcium 8.9 - 10.3 mg/dL 9.3 9.4 10.2  Total Protein 6.5 - 8.1 g/dL - 6.7 6.5  Total Bilirubin 0.3 - 1.2 mg/dL - 0.6 0.4  Alkaline Phos 38 - 126 U/L - 90 65  AST 15 - 41 U/L - 22 17  ALT 0 - 44 U/L - 36 25    Imaging studies: No new pertinent imaging studies   Assessment/Plan: (ICD-10's: L41.22) 63 y.o. female with improving cellulitic infection to the left jaw without appreciable abscess and no airway compromise at this time   - Continue IV Abx while in hospital; she has completed 48 hours; should go home on PO ABx   - Follow up BCx; no growth   - No further surgical intervention indication at this time; general surgery will sign off   - further management per primary service; discharge when medically appropriate   All of the above findings and recommendations were discussed with the patient, and the medical team, and all of patient's questions were answered to her expressed satisfaction.  -- Edison Simon, PA-C [facility] 12/14/2018, 9:23 AM [phone] M-F: 7am - 4pm  I saw and evaluated the patient.  I agree with the above documentation, exam, and  plan, which I have edited where appropriate. Fredirick Maudlin  12:00 PM

## 2018-12-14 NOTE — Progress Notes (Signed)
[place] at [facility]                                                                                                                                                                                  Patient Demographics   Jillian [name], is a 63 y.o. female, DOB - 06/11/1955, [id number]  Admit date - 12/12/2018   Admitting Physician Bettey Costa, MD  Outpatient Primary MD for the patient is Jinny Sanders, MD   LOS - 2  Subjective: Patient started having some drainage from her left jaw with some pus pain is improved    Review of Systems:   CONSTITUTIONAL: No documented fever. No fatigue, weakness. No weight gain, no weight loss.  EYES: No blurry or double vision.  ENT: No tinnitus. No postnasal drip. No redness of the oropharynx.  RESPIRATORY: No cough, no wheeze, no hemoptysis. No dyspnea.  CARDIOVASCULAR: No chest pain. No orthopnea. No palpitations. No syncope.  GASTROINTESTINAL: No nausea, no vomiting or diarrhea. No abdominal pain. No melena or hematochezia.  GENITOURINARY: No dysuria or hematuria.  ENDOCRINE: No polyuria or nocturia. No heat or cold intolerance.  HEMATOLOGY: No anemia. No bruising. No bleeding.  INTEGUMENTARY: Left jaw swelling MUSCULOSKELETAL: No arthritis. No swelling. No gout.  NEUROLOGIC: No numbness, tingling, or ataxia. No seizure-type activity.  PSYCHIATRIC: No anxiety. No insomnia. No ADD.    Vitals:   Vitals:   12/13/18 0400 12/13/18 1441 12/13/18 2039 12/14/18 0444  BP: (!) 108/53 112/68 (!) 112/57 111/62  Pulse: 92 83 83 84  Resp: 15 18 15 16   Temp: 98 F (36.7 C) 98.9 F (37.2 C) (!) 97.4 F (36.3 C) 98.6 F (37 C)  TempSrc: Oral Oral Oral Oral  SpO2: 96% 93% 96% 93%  Weight:      Height:        Wt Readings from Last 3 Encounters:  12/12/18 100.4 kg  11/16/18 100 kg  11/02/18 98.1 kg     Intake/Output Summary (Last 24 hours) at 12/14/2018 1238 Last data filed at 12/14/2018 0900 Gross per 24 hour   Intake 1542.36 ml  Output -  Net 1542.36 ml    Physical Exam:   GENERAL: Pleasant-appearing in no apparent distress.  HEAD, EYES, EARS, NOSE AND THROAT: Atraumatic, normocephalic. Extraocular muscles are intact. Pupils equal and reactive to light. Sclerae anicteric. No conjunctival injection. No oro-pharyngeal erythema.  NECK: Supple. There is no jugular venous distention. No bruits, no lymphadenopathy, no thyromegaly.  HEART: Regular rate and rhythm,. No murmurs, no rubs, no clicks.  LUNGS: Clear to auscultation bilaterally. No rales or rhonchi. No wheezes.  ABDOMEN: Soft, flat, nontender, nondistended. Has good bowel  sounds. No hepatosplenomegaly appreciated.  EXTREMITIES: No evidence of any cyanosis, clubbing, or peripheral edema.  +2 pedal and radial pulses bilaterally.  NEUROLOGIC: The patient is alert, awake, and oriented x3 with no focal motor or sensory deficits appreciated bilaterally.  SKIN: Left jaw there is still area of redness and now the area is opened up with some pus Psych: Not anxious, depressed LN: No inguinal LN enlargement    Antibiotics   Anti-infectives (From admission, onward)   Start     Dose/Rate Route Frequency Ordered Stop   12/14/18 0000  vancomycin (VANCOCIN) IVPB 750 mg/150 ml premix     750 mg 150 mL/hr over 60 Minutes Intravenous Every 12 hours 12/13/18 1229     12/13/18 0930  vancomycin (VANCOCIN) 2,000 mg in sodium chloride 0.9 % 500 mL IVPB     2,000 mg 250 mL/hr over 120 Minutes Intravenous  Once 12/13/18 0859 12/13/18 1255   12/12/18 2230  Ampicillin-Sulbactam (UNASYN) 3 g in sodium chloride 0.9 % 100 mL IVPB     3 g 200 mL/hr over 30 Minutes Intravenous Every 6 hours 12/12/18 2031     12/12/18 1530  Ampicillin-Sulbactam (UNASYN) 3 g in sodium chloride 0.9 % 100 mL IVPB     3 g 200 mL/hr over 30 Minutes Intravenous  Once 12/12/18 1520 12/12/18 1722      Medications   Scheduled Meds: . buPROPion  150 mg Oral Daily  . cholecalciferol   400 Units Oral BID  . DULoxetine  90 mg Oral Daily  . enoxaparin (LOVENOX) injection  40 mg Subcutaneous Q24H  . omega-3 acid ethyl esters  3,000 mg Oral Daily  . pregabalin  150 mg Oral TID  . traMADol  100 mg Oral TID   Continuous Infusions: . sodium chloride Stopped (12/13/18 1255)  . ampicillin-sulbactam (UNASYN) IV 3 g (12/14/18 1052)  . vancomycin 750 mg (12/14/18 1221)   PRN Meds:.sodium chloride, acetaminophen **OR** acetaminophen, HYDROcodone-acetaminophen, methocarbamol, ondansetron **OR** ondansetron (ZOFRAN) IV, polyethylene glycol   Data Review:   Micro Results Recent Results (from the past 240 hour(s))  SARS CORONAVIRUS 2 (TAT 6-24 HRS) Nasopharyngeal Nasopharyngeal Swab     Status: None   Collection Time: 12/12/18  3:54 PM   Specimen: Nasopharyngeal Swab  Result Value Ref Range Status   SARS Coronavirus 2 NEGATIVE NEGATIVE Final    Comment: (NOTE) SARS-CoV-2 target nucleic acids are NOT DETECTED. The SARS-CoV-2 RNA is generally detectable in upper and lower respiratory specimens during the acute phase of infection. Negative results do not preclude SARS-CoV-2 infection, do not rule out co-infections with other pathogens, and should not be used as the sole basis for treatment or other patient management decisions. Negative results must be combined with clinical observations, patient history, and epidemiological information. The expected result is Negative. Fact Sheet for Patients: SugarRoll.be Fact Sheet for Healthcare Providers: https://www.woods-mathews.com/ This test is not yet approved or cleared by the [place] FDA and  has been authorized for detection and/or diagnosis of SARS-CoV-2 by FDA under an Emergency Use Authorization (EUA). This EUA will remain  in effect (meaning this test can be used) for the duration of the COVID-19 declaration under Section 56 4(b)(1) of the Act, 21 U.S.C. section 360bbb-3(b)(1),  unless the authorization is terminated or revoked sooner. Performed at [facility], [place] [address], [place], [place] [address]   Blood culture (routine x 2)     Status: None (Preliminary result)   Collection Time: 12/12/18  3:54 PM  Specimen: BLOOD  Result Value Ref Range Status   Specimen Description BLOOD BLOOD RIGHT ARM  Final   Special Requests   Final    BOTTLES DRAWN AEROBIC AND ANAEROBIC Blood Culture adequate volume   Culture   Final    NO GROWTH 2 DAYS Performed at [facility], [address] [place], [place] [address]    Report Status PENDING  Incomplete  Blood culture (routine x 2)     Status: None (Preliminary result)   Collection Time: 12/12/18  3:54 PM   Specimen: BLOOD  Result Value Ref Range Status   Specimen Description BLOOD BLOOD RIGHT HAND  Final   Special Requests   Final    BOTTLES DRAWN AEROBIC AND ANAEROBIC Blood Culture adequate volume   Culture   Final    NO GROWTH 2 DAYS Performed at [facility], [address], [place], [place] [address]    Report Status PENDING  Incomplete    Radiology Reports Ct Soft Tissue Neck W Contrast  Result Date: 12/12/2018 CLINICAL DATA:  Left jaw abscess. Increasing redness and swelling despite antibiotic treatment. EXAM: CT NECK WITH CONTRAST TECHNIQUE: Multidetector CT imaging of the neck was performed using the standard protocol following the bolus administration of intravenous contrast. CONTRAST:  61mL OMNIPAQUE IOHEXOL 300 MG/ML  SOLN COMPARISON:  None. FINDINGS: Pharynx and larynx: No pharyngeal mass or swelling is identified. The airway is patent. Salivary glands: No inflammation, mass, or stone. Thyroid: Unremarkable. Lymph nodes: Subcentimeter short axis left level I and II lymph nodes are slightly more prominent on the left and likely reactive. Vascular: Major vascular structures of the neck are patent. Limited intracranial: Unremarkable. Visualized orbits: Unremarkable. Mastoids and  visualized paranasal sinuses: Mild mucosal thickening in the left maxillary sinus. Clear mastoid air cells. Skeleton: Mild cervical spondylosis. No suspicious osseous lesion. Upper chest: Clear lung apices. Other: Inflammatory changes are present in the predominantly subcutaneous soft tissues of the left lower face overlying the mandibular body. No organized fluid collection is present, and there is no evidence of significant deep space inflammation. IMPRESSION: Superficial inflammation in the left lower face compatible with cellulitis. No abscess. Electronically Signed   By: Logan Bores M.D.   On: 12/12/2018 17:49   Mr Knee Right Wo Contrast  Result Date: 11/28/2018 CLINICAL DATA:  Progressive right knee pain and swelling EXAM: MRI OF THE RIGHT KNEE WITHOUT CONTRAST TECHNIQUE: Multiplanar, multisequence MR imaging of the knee was performed. No intravenous contrast was administered. COMPARISON:  X-ray 11/16/2018 FINDINGS: MENISCI Medial meniscus: Complex tear of the posterior horn and body segments with possible displaced fragment of meniscal tissue posterior to the PCL versus an irregular appearance of a meniscofemoral ligament. Lateral meniscus:  Intact. LIGAMENTS Cruciates:  Intact ACL and PCL. Collaterals: Intact MCL with periligamentous edema, which may be reactive secondary to adjacent medial compartment pathology. Lateral collateral ligament complex is intact. CARTILAGE Patellofemoral:  No discrete chondral defect. Medial: Extensive full-thickness cartilage loss of the weight-bearing medial femoral condyle (series 9, image 16). Lateral:  No chondral defect. Joint: Moderate joint effusion. Thickened medial plica. Edema within Hoffa's fat. Popliteal Fossa: Small Baker's cyst. Intermediate signal within the popliteus tendon attachment. Intramuscular edema within the visualized popliteus muscle. Extensor Mechanism: Intact quadriceps tendon and patellar tendon. Bidirectional patellar enthesophytes. Bones:  Reactive subchondral marrow changes involving the medial compartment. No fracture or bone lesion. Other: Moderate soft tissue edema surrounds the knee joint, predominantly posteriorly. IMPRESSION: 1. Extensive complex tearing of the posterior horn and  body segments of the medial meniscus with probable displaced flap of meniscal tissue posteriorly. 2. Extensive full-thickness cartilage loss of the weight-bearing medial femoral condyle with associated subchondral marrow changes. 3. Findings suggestive of a popliteus muscle strain and tendinosis. 4. Intact MCL with periligamentous edema which may reflect low-grade sprain versus reactive changes secondary to medial compartment pathology. 5. Moderate joint effusion, pericapsular edema, and small Baker's cyst. Electronically Signed   By: Davina Poke M.D.   On: 11/28/2018 12:49   Dg Knee 4 Views W/patella Right  Result Date: 11/16/2018 CLINICAL DATA:  63 year old presenting with a 6 week history of severe RIGHT knee pain. No known injuries. EXAM: RIGHT KNEE - COMPLETE 4+ VIEW COMPARISON:  None. FINDINGS: No evidence of acute, subacute or healed fractures. Moderate MEDIAL and patellofemoral compartment joint space narrowing. LATERAL compartment joint space well-preserved. Mild enthesopathic spurring at the insertion of the patellar tendon on the INFERIOR patella and the tibial tubercle. Mild enthesopathic spurring at the insertion of the quadriceps tendon on the SUPERIOR patella. Well-preserved bone mineral density. Small joint effusion. No evidence of a patellar tracking abnormality on the sunrise view. IMPRESSION: 1. Moderate MEDIAL and patellofemoral compartment osteoarthritis. 2. Small joint effusion. Electronically Signed   By: Evangeline Dakin M.D.   On: 11/16/2018 14:00     CBC Recent Labs  Lab 12/12/18 1254 12/14/18 0507  WBC 10.9* 7.0  HGB 12.8 11.9*  HCT 38.5 36.0  PLT 205 208  MCV 93.0 93.3  MCH 30.9 30.8  MCHC 33.2 33.1  RDW 12.2 11.9   LYMPHSABS 1.7  --   MONOABS 1.1*  --   EOSABS 0.1  --   BASOSABS 0.1  --     Chemistries  Recent Labs  Lab 12/12/18 1254 12/14/18 0507  NA 136 138  K 4.4 4.2  CL 104 104  CO2 24 29  GLUCOSE 97 105*  BUN 22 18  CREATININE 0.83 0.83  CALCIUM 9.4 9.3  AST 22  --   ALT 36  --   ALKPHOS 90  --   BILITOT 0.6  --    ------------------------------------------------------------------------------------------------------------------ estimated creatinine clearance is 84.9 mL/min (by C-G formula based on SCr of 0.83 mg/dL). ------------------------------------------------------------------------------------------------------------------ No results for input(s): HGBA1C in the last 72 hours. ------------------------------------------------------------------------------------------------------------------ No results for input(s): CHOL, HDL, LDLCALC, TRIG, CHOLHDL, LDLDIRECT in the last 72 hours. ------------------------------------------------------------------------------------------------------------------ No results for input(s): TSH, T4TOTAL, T3FREE, THYROIDAB in the last 72 hours.  Invalid input(s): FREET3 ------------------------------------------------------------------------------------------------------------------ No results for input(s): VITAMINB12, FOLATE, FERRITIN, TIBC, IRON, RETICCTPCT in the last 72 hours.  Coagulation profile No results for input(s): INR, PROTIME in the last 168 hours.  No results for input(s): DDIMER in the last 72 hours.  Cardiac Enzymes No results for input(s): CKMB, TROPONINI, MYOGLOBIN in the last 168 hours.  Invalid input(s): CK ------------------------------------------------------------------------------------------------------------------ Invalid input(s): POCBNP    Assessment & Plan   63 year old female with depression who presents to the emergency room due to increasing pain and swelling of her left jaw.  1.  Left jaw cellulitis  with localized abscess Continue to have some drainage, continue IV vancomycin and Unasyn  2.  Depression: Continue Wellbutrin and duloxetine     Code Status Orders  (From admission, onward)         Start     Ordered   12/12/18 2032  Full code  Continuous     12/12/18 2031        Code Status History    This patient has a  current code status but no historical code status.   Advance Care Planning Activity           Consults surgery  DVT Prophylaxis  Lovenox  Lab Results  Component Value Date   PLT 208 12/14/2018     Time Spent in minutes 35 minutes greater than 50% of time spent in care coordination and counseling patient regarding the condition and plan of care.   Dustin Flock M.D on 12/14/2018 at 12:38 PM  Between 7am to 6pm - Pager - [phone]  After 6pm go to [email or web address] - Proofreader  [employer]   Office  [phone]

## 2018-12-15 MED ORDER — DOXYCYCLINE MONOHYDRATE 100 MG PO TABS: 100.0000 mg | ORAL_TABLET | Freq: Two times a day (BID) | ORAL | 0 refills | Status: AC

## 2018-12-15 NOTE — Discharge Summary (Signed)
[facility] NAME: Jillian [name]    MR#:  [id number]  DATE OF BIRTH:  [birth year]  DATE OF ADMISSION:  12/12/2018   ADMITTING PHYSICIAN: Bettey Costa, MD  DATE OF DISCHARGE: 12/15/2018  PRIMARY CARE PHYSICIAN: Jinny Sanders, MD   ADMISSION DIAGNOSIS:  Cellulitis of neck [id number] Abscess [L02.91] DISCHARGE DIAGNOSIS:  Active Problems:   Abscess  SECONDARY DIAGNOSIS:   Past Medical History:  Diagnosis Date  . Cancer ([place])    basal cell 2016   HOSPITAL COURSE:  Chief complaint; left jaw abscess  History of presenting complaint; Jillian [name]  is a 63 y.o. female with a known history of depression who presented to the emergency room due to concern of jaw abscess.Patient reported few days prior she noticed pimple on her jaw over the past several days it had gotten worse.  She called tele-doc yesterday and they prescribed her doxycycline.  She took 3 doses however she notices that the abscess and redness has increased. Emergency physician incised and drained the abscess.  Patient was admitted to medical service for further evaluation and management.  Hospital course; 1. Left jaw cellulitis with localized abscess Patient status post I&D done in the emergency room.  Patient was also seen by general surgeon during this admission.  No further drainage from I&D recommended.  Patient was treated with IV antibiotics with vancomycin and Unasyn.  Significantly improved clinically.  Patient remains afebrile and wishes to be discharged home today.  No further plans from surgical team.  General surgery signed off already.  Being discharged on p.o. doxycycline with MRSA coverage for at least the next 5 days.  Follow-up with primary care physician post discharge from the hospital.  Patient clinically stable for discharge. 2. Depression: Continue Wellbutrin and duloxetine   DISCHARGE CONDITIONS:  Stable CONSULTS OBTAINED:   DRUG ALLERGIES:   No Known Allergies DISCHARGE MEDICATIONS:   Allergies as of 12/15/2018   No Known Allergies     Medication List    TAKE these medications   Alpha-Lipoic Acid 600 MG Tabs Take 600 mg by mouth daily.   buPROPion 150 MG 24 hr tablet Commonly known as: WELLBUTRIN XL TAKE 1 TABLET(150 MG) BY MOUTH DAILY What changed: See the new instructions.   cholecalciferol 10 MCG (400 UNIT) Tabs tablet Commonly known as: VITAMIN D3 Take 400 Units by mouth 2 (two) times daily.   Co Q 10 100 MG Caps Take 1 capsule by mouth daily.   doxycycline 100 MG tablet Commonly known as: ADOXA Take 1 tablet (100 mg total) by mouth 2 (two) times daily for 5 days.   DULoxetine 30 MG capsule Commonly known as: CYMBALTA Take 90 mg by mouth daily.   fish oil-omega-3 fatty acids 1000 MG capsule Take 3,000 mg by mouth daily.   Magnesium Malate 1250 (141.7 Mg) MG Tabs Take 1-2 tablets by mouth See admin instructions. Take 1 tablet (141.7mg ) by mouth every morning and take 2 tablets (283.4mg ) by mouth every evening   methocarbamol 500 MG tablet Commonly known as: ROBAXIN TAKE 1 TABLET BY MOUTH THREE TIMES DAILY What changed:   when to take this  reasons to take this   pregabalin 150 MG capsule Commonly known as: LYRICA TAKE 1 CAPSULE BY MOUTH THREE TIMES DAILY   traMADol 50 MG tablet Commonly known as: ULTRAM Take two tablets by mouth three times a day. What changed:   how much to take  how to take  this  when to take this  additional instructions   Turmeric 500 MG Caps Take 1,000 mg by mouth 2 (two) times daily.        DISCHARGE INSTRUCTIONS:   DIET:  Cardiac diet DISCHARGE CONDITION:  Stable ACTIVITY:  Activity as tolerated OXYGEN:  Home Oxygen: No.  Oxygen Delivery: room air DISCHARGE LOCATION:  home   If you experience worsening of your admission symptoms, develop shortness of breath, life threatening emergency, suicidal or homicidal thoughts you must seek medical  attention immediately by calling 911 or calling your MD immediately  if symptoms less severe.  You Must read complete instructions/literature along with all the possible adverse reactions/side effects for all the Medicines you take and that have been prescribed to you. Take any new Medicines after you have completely understood and accpet all the possible adverse reactions/side effects.   Please note  You were cared for by a hospitalist during your hospital stay. If you have any questions about your discharge medications or the care you received while you were in the hospital after you are discharged, you can call the unit and asked to speak with the hospitalist on call if the hospitalist that took care of you is not available. Once you are discharged, your primary care physician will handle any further medical issues. Please note that NO REFILLS for any discharge medications will be authorized once you are discharged, as it is imperative that you return to your primary care physician (or establish a relationship with a primary care physician if you do not have one) for your aftercare needs so that they can reassess your need for medications and monitor your lab values.    On the day of Discharge:  VITAL SIGNS:  Blood pressure (!) 103/46, pulse 78, temperature 98.6 F (37 C), temperature source Oral, resp. rate 15, height 5\' 8"  (1.727 m), weight 100.4 kg, SpO2 95 %. PHYSICAL EXAMINATION:  GENERAL:  63 y.o.-year-old patient lying in the bed with no acute distress.  EYES: Pupils equal, round, reactive to light and accommodation. No scleral icterus. Extraocular muscles intact.  HEENT: Head atraumatic, normocephalic. Oropharynx and nasopharynx clear.  Small area of erythema around site of recent I&D which appears to be drying up on the left jaw. NECK:  Supple, no jugular venous distention. No thyroid enlargement, no tenderness.  LUNGS: Normal breath sounds bilaterally, no wheezing, rales,rhonchi or  crepitation. No use of accessory muscles of respiration.  CARDIOVASCULAR: S1, S2 normal. No murmurs, rubs, or gallops.  ABDOMEN: Soft, non-tender, non-distended. Bowel sounds present. No organomegaly or mass.  EXTREMITIES: No pedal edema, cyanosis, or clubbing.  NEUROLOGIC: Cranial nerves II through XII are intact. Muscle strength 5/5 in all extremities. Sensation intact. Gait not checked.  PSYCHIATRIC: The patient is alert and oriented x 3.  SKIN: No obvious rash, lesion, or ulcer.  DATA REVIEW:   CBC Recent Labs  Lab 12/14/18 0507  WBC 7.0  HGB 11.9*  HCT 36.0  PLT 208    Chemistries  Recent Labs  Lab 12/12/18 1254 12/14/18 0507  NA 136 138  K 4.4 4.2  CL 104 104  CO2 24 29  GLUCOSE 97 105*  BUN 22 18  CREATININE 0.83 0.83  CALCIUM 9.4 9.3  AST 22  --   ALT 36  --   ALKPHOS 90  --   BILITOT 0.6  --      Microbiology Results  Results for orders placed or performed during the hospital encounter of 12/12/18  SARS CORONAVIRUS 2 (TAT 6-24 HRS) Nasopharyngeal Nasopharyngeal Swab     Status: None   Collection Time: 12/12/18  3:54 PM   Specimen: Nasopharyngeal Swab  Result Value Ref Range Status   SARS Coronavirus 2 NEGATIVE NEGATIVE Final    Comment: (NOTE) SARS-CoV-2 target nucleic acids are NOT DETECTED. The SARS-CoV-2 RNA is generally detectable in upper and lower respiratory specimens during the acute phase of infection. Negative results do not preclude SARS-CoV-2 infection, do not rule out co-infections with other pathogens, and should not be used as the sole basis for treatment or other patient management decisions. Negative results must be combined with clinical observations, patient history, and epidemiological information. The expected result is Negative. Fact Sheet for Patients: SugarRoll.be Fact Sheet for Healthcare Providers: https://www.woods-mathews.com/ This test is not yet approved or cleared by the [place] FDA and  has been authorized for detection and/or diagnosis of SARS-CoV-2 by FDA under an Emergency Use Authorization (EUA). This EUA will remain  in effect (meaning this test can be used) for the duration of the COVID-19 declaration under Section 56 4(b)(1) of the Act, 21 U.S.C. section 360bbb-3(b)(1), unless the authorization is terminated or revoked sooner. Performed at [facility], [place] [address] [place], [place] [address]   Blood culture (routine x 2)     Status: None (Preliminary result)   Collection Time: 12/12/18  3:54 PM   Specimen: BLOOD  Result Value Ref Range Status   Specimen Description BLOOD BLOOD RIGHT ARM  Final   Special Requests   Final    BOTTLES DRAWN AEROBIC AND ANAEROBIC Blood Culture adequate volume   Culture   Final    NO GROWTH 3 DAYS Performed at [facility], [address] [place], [place] [address]    Report Status PENDING  Incomplete  Blood culture (routine x 2)     Status: None (Preliminary result)   Collection Time: 12/12/18  3:54 PM   Specimen: BLOOD  Result Value Ref Range Status   Specimen Description BLOOD BLOOD RIGHT HAND  Final   Special Requests   Final    BOTTLES DRAWN AEROBIC AND ANAEROBIC Blood Culture adequate volume   Culture   Final    NO GROWTH 3 DAYS Performed at [facility], [address], [place], [place] [address]    Report Status PENDING  Incomplete  Aerobic/Anaerobic Culture (surgical/deep wound)     Status: None (Preliminary result)   Collection Time: 12/14/18 12:18 PM   Specimen: Face; Wound  Result Value Ref Range Status   Specimen Description   Final    FACE Performed at [facility], [address], [place], [place] [address]    Special Requests   Final    Normal Performed at [facility], [place], [place] [address]    Gram Stain   Final    FEW WBC PRESENT, PREDOMINANTLY PMN RARE GRAM POSITIVE COCCI Performed at [facility],  [place] [address], [place], [place] [address]    Culture PENDING  Incomplete   Report Status PENDING  Incomplete    RADIOLOGY:  No results found.   Management plans discussed with the patient, family and they are in agreement.  CODE STATUS: Full Code   TOTAL TIME TAKING CARE OF THIS PATIENT: 38 minutes.    Vaden Becherer M.D on 12/15/2018 at 10:33 AM  Between 7am to 6pm - Pager - [phone]  After 6pm go to [email or web address] - [place]  [facility]  Office  [phone]  CC: Primary care physician; Jinny Sanders, MD   Note: This dictation was prepared with Dragon dictation along with smaller phrase technology. Any transcriptional errors that result from this process are unintentional.

## 2018-12-15 NOTE — Progress Notes (Signed)
Pt being discharged home, discharge instructions reviewed with pt, states understanding, pt with no complaints 

## 2018-12-16 ENCOUNTER — Telehealth: Payer: Self-pay

## 2018-12-16 ENCOUNTER — Other Ambulatory Visit: Payer: Self-pay | Admitting: *Deleted

## 2018-12-16 MED ORDER — METHOCARBAMOL 500 MG PO TABS: 500.0000 mg | ORAL_TABLET | Freq: Three times a day (TID) | ORAL | 0 refills | Status: DC | PRN

## 2018-12-16 MED ORDER — PREGABALIN 150 MG PO CAPS: 150.0000 mg | ORAL_CAPSULE | Freq: Three times a day (TID) | ORAL | 1 refills | Status: DC

## 2018-12-16 NOTE — Telephone Encounter (Signed)
Patient advised and will schedule CPE at a later time

## 2018-12-16 NOTE — Telephone Encounter (Signed)
Last office visit 11/16/2018 with Dr. Lorelei Pont for knee pain.  Last refilled Lyrica 10/18/2018 for #90 with 1 refill.  Methocarbamol 08/02/2018 for #90 with no refills.  Next Appt: 12/23/2018 for hospital follow up.

## 2018-12-16 NOTE — Telephone Encounter (Signed)
Can do hospital follow up and pre op at same visit.  Reschedule CPX in 2 months or so.

## 2018-12-16 NOTE — Telephone Encounter (Signed)
Transition Care Management Follow-up Telephone Call  Please review patient concern section.  Date discharged? 12/15/2018   How have you been since you were released from the hospital? Doing much better, abscess is draining some but slowing down. Taking Doxycycline daily for 5 days, tolerating it well.   Do you understand why you were in the hospital? yes   Do you understand the discharge instructions? yes   Where were you discharged to? Home with husband and her mom   Items Reviewed:  Medications reviewed: yes  Allergies reviewed: yes  Dietary changes reviewed: yes  Referrals reviewed: yes   Functional Questionnaire:   Activities of Daily Living (ADLs):   She states they are independent in the following: ambulation, dressing, bathing, toileting, hygiene, fixing food and medication. States they require assistance with the following: none   Any transportation issues/concerns?: no issues at this time.   Any patient concerns? Patient had to cancel her CPE appointment on 12/13/2018 due to been at the hospital and wanted to see when she should complete this? Also she has partial knee replacement surgery scheduled for October 8th and needed to see you for that also. Patient has hospital follow up on 12/23/2018 but not sure how much can be done at that visit. Does she need separate appointments for all of this?   Confirmed importance and date/time of follow-up visits scheduled yes  Provider Appointment booked with Dr Diona Browner on 12/23/2018.   Confirmed with patient if condition begins to worsen call PCP or go to the ER.  Patient was given the office number and encouraged to call back with question or concerns.  : yes

## 2018-12-17 LAB — CULTURE, BLOOD (ROUTINE X 2)
Culture: NO GROWTH
Culture: NO GROWTH
Special Requests: ADEQUATE
Special Requests: ADEQUATE

## 2018-12-19 LAB — AEROBIC/ANAEROBIC CULTURE W GRAM STAIN (SURGICAL/DEEP WOUND): Special Requests: NORMAL

## 2018-12-23 ENCOUNTER — Other Ambulatory Visit: Payer: Self-pay

## 2018-12-23 ENCOUNTER — Ambulatory Visit: Payer: PRIVATE HEALTH INSURANCE | Admitting: Family Medicine

## 2018-12-23 ENCOUNTER — Encounter: Payer: Self-pay | Admitting: Family Medicine

## 2018-12-23 VITALS — BP 120/60 | HR 91 | Temp 97.9°F | Ht 67.5 in | Wt 223.5 lb

## 2018-12-23 DIAGNOSIS — L0211 Cutaneous abscess of neck: Secondary | ICD-10-CM

## 2018-12-23 DIAGNOSIS — Z01818 Encounter for other preprocedural examination: Secondary | ICD-10-CM

## 2018-12-23 DIAGNOSIS — R7303 Prediabetes: Secondary | ICD-10-CM

## 2018-12-23 DIAGNOSIS — L03221 Cellulitis of neck: Secondary | ICD-10-CM | POA: Insufficient documentation

## 2018-12-23 DIAGNOSIS — M797 Fibromyalgia: Secondary | ICD-10-CM

## 2018-12-23 LAB — POCT GLYCOSYLATED HEMOGLOBIN (HGB A1C): Hemoglobin A1C: 5.4 % (ref 4.0–5.6)

## 2018-12-23 NOTE — Patient Instructions (Signed)
We will send in surgery forms!

## 2018-12-23 NOTE — Assessment & Plan Note (Signed)
No red flags, Open oropharynx.   Eval EKG and POC A1C today.  Recent CMET and cbc during hospitalization normalized.

## 2018-12-23 NOTE — Assessment & Plan Note (Signed)
Improved on current regimen 

## 2018-12-23 NOTE — Assessment & Plan Note (Signed)
Resolved s/p antibiotics. 

## 2018-12-23 NOTE — Progress Notes (Addendum)
Chief Complaint  Patient presents with  . Hospitalization Follow-up    Abscess/Celluilits of neck    History of Present Illness: HPI    63 year old female presents for hospital follow up... admitted for abscess on neck on 9/14 to 9/17  Failed outpatient doxy treatment.  Hospital course; 1. Left jaw cellulitis withlocalized abscess Patient status post I&D done in the emergency room.  Patient was also seen by general surgeon during this admission.  No further drainage from I&D recommended.  Patient was treated with IV antibiotics with vancomycin and Unasyn.  Significantly improved clinically.  Patient remains afebrile and wishes to be discharged home today.  No further plans from surgical team.  General surgery signed off already.  Being discharged on p.o. doxycycline with MRSA coverage for at least the next 5 days.  Follow-up with primary care physician post discharge from the hospital.  Patient clinically stable for discharge     Today she reports no further pain, no redness, no fever, no discharge.  Completed antibiotics.  Blood culture negative Neg HIV. Wound culture on day 3 of antibitoics... showed staph.  She also has upcoming knee replacement surgery that she needs clearance for.  Dr. Mardelle Matte on 01/05/2019   No past surgical complication.  No CP, no SOB. No swallowing issues.  Glucose well controlled during recent hospitalization.   COVID 19 screen No recent travel or known exposure to COVID19 The patient denies respiratory symptoms of COVID 19 at this time.  The importance of social distancing was discussed today.   Review of Systems  Constitutional: Negative for chills and fever.  HENT: Negative for congestion and ear pain.   Eyes: Negative for pain and redness.  Respiratory: Negative for cough and shortness of breath.   Cardiovascular: Negative for chest pain, palpitations and leg swelling.  Gastrointestinal: Negative for abdominal pain, blood in stool,  constipation, diarrhea, nausea and vomiting.  Genitourinary: Negative for dysuria.  Musculoskeletal: Negative for falls and myalgias.  Skin: Negative for rash.  Neurological: Negative for dizziness.  Psychiatric/Behavioral: Negative for depression. The patient is not nervous/anxious.       Past Medical History:  Diagnosis Date  . Cancer ([place])    basal cell 2016    reports that she quit smoking about 26 years ago. Her smoking use included cigarettes. She has a 15.00 pack-year smoking history. She has never used smokeless tobacco. She reports that she does not drink alcohol or use drugs.   Current Outpatient Medications:  .  Alpha-Lipoic Acid 600 MG TABS, Take 600 mg by mouth daily. , Disp: , Rfl:  .  buPROPion (WELLBUTRIN XL) 150 MG 24 hr tablet, TAKE 1 TABLET(150 MG) BY MOUTH DAILY, Disp: 90 tablet, Rfl: 1 .  cholecalciferol (VITAMIN D) 400 units TABS tablet, Take 400 Units by mouth 2 (two) times daily., Disp: , Rfl:  .  Coenzyme Q10 (CO Q 10) 100 MG CAPS, Take 1 capsule by mouth daily., Disp: , Rfl:  .  DULoxetine (CYMBALTA) 30 MG capsule, Take 90 mg by mouth daily., Disp: , Rfl:  .  fish oil-omega-3 fatty acids 1000 MG capsule, Take 3,000 mg by mouth daily. , Disp: , Rfl:  .  Magnesium Malate 1250 (141.7 Mg) MG TABS, Take 1-2 tablets by mouth See admin instructions. Take 1 tablet (141.7mg ) by mouth every morning and take 2 tablets (283.4mg ) by mouth every evening, Disp: , Rfl:  .  methocarbamol (ROBAXIN) 500 MG tablet, Take 1 tablet (500 mg total) by mouth  3 (three) times daily as needed for muscle spasms., Disp: 90 tablet, Rfl: 0 .  pregabalin (LYRICA) 150 MG capsule, Take 1 capsule (150 mg total) by mouth 3 (three) times daily., Disp: 90 capsule, Rfl: 1 .  traMADol (ULTRAM) 50 MG tablet, Take two tablets by mouth three times a day. (Patient taking differently: Take 100 mg by mouth 3 (three) times daily. ), Disp: 180 tablet, Rfl: 0 .  Turmeric 500 MG CAPS, Take 1,000 mg by mouth 2 (two)  times daily. , Disp: , Rfl:    Observations/Objective: Temperature 97.9 F (36.6 C), temperature source Temporal, height 5' 7.5" (1.715 m), weight 223 lb 8 oz (101.4 kg).  Physical Exam Constitutional:      General: She is not in acute distress.    Appearance: Normal appearance. She is well-developed. She is not ill-appearing or toxic-appearing.  HENT:     Head: Normocephalic.     Right Ear: Hearing, tympanic membrane, ear canal and external ear normal.     Left Ear: Hearing, tympanic membrane, ear canal and external ear normal.     Nose: Nose normal.  Eyes:     General: Lids are normal. Lids are everted, no foreign bodies appreciated.     Conjunctiva/sclera: Conjunctivae normal.     Pupils: Pupils are equal, round, and reactive to light.  Neck:     Musculoskeletal: Normal range of motion and neck supple.     Thyroid: No thyroid mass or thyromegaly.     Vascular: No carotid bruit.     Trachea: Trachea normal.  Cardiovascular:     Rate and Rhythm: Normal rate and regular rhythm.     Heart sounds: Normal heart sounds, S1 normal and S2 normal. No murmur. No gallop.   Pulmonary:     Effort: Pulmonary effort is normal. No respiratory distress.     Breath sounds: Normal breath sounds. No wheezing, rhonchi or rales.  Abdominal:     General: Bowel sounds are normal. There is no distension or abdominal bruit.     Palpations: Abdomen is soft. There is no fluid wave or mass.     Tenderness: There is no abdominal tenderness. There is no guarding or rebound.     Hernia: No hernia is present.  Lymphadenopathy:     Cervical: No cervical adenopathy.  Skin:    General: Skin is warm and dry.     Findings: No rash.     Comments: Healing lesion left lower jaw, mild firmness surrounding, no warmth, no discharge, no heat, nontender.  Neurological:     Mental Status: She is alert.     Cranial Nerves: No cranial nerve deficit.     Sensory: No sensory deficit.  Psychiatric:        Mood and  Affect: Mood is not anxious or depressed.        Speech: Speech normal.        Behavior: Behavior normal. Behavior is cooperative.        Judgment: Judgment normal.      Assessment and Plan   Neck abscess Resolved s/p antibiotics.  Pre-op evaluation No red flags, Open oropharynx.   Eval EKG and POC A1C today.  Recent CMET and cbc during hospitalization normalized.  Prediabetes Eval A1C.  Fibromyalgia Improved on current regimen.   EKG: normal EKG, normal sinus rhythm,no  previous tracings to compare.   Eliezer Lofts, MD

## 2018-12-23 NOTE — Assessment & Plan Note (Signed)
Eval A1C.

## 2019-01-04 ENCOUNTER — Other Ambulatory Visit: Payer: Self-pay | Admitting: *Deleted

## 2019-01-04 NOTE — Telephone Encounter (Signed)
Last office visit 12/23/2018 for hospitalization.  Last refilled 12/06/2018 for #180 with no refills.  No future appointments.

## 2019-01-05 MED ORDER — TRAMADOL HCL 50 MG PO TABS: ORAL_TABLET | ORAL | 0 refills | Status: DC

## 2019-01-11 ENCOUNTER — Other Ambulatory Visit: Payer: Self-pay | Admitting: Family Medicine

## 2019-01-13 ENCOUNTER — Encounter (HOSPITAL_COMMUNITY)
Admission: RE | Admit: 2019-01-13 | Discharge: 2019-01-13 | Disposition: A | Discharge status: Home / Self Care | Payer: PRIVATE HEALTH INSURANCE | Source: Ambulatory Visit | Attending: Orthopedic Surgery | Admitting: Orthopedic Surgery

## 2019-01-13 ENCOUNTER — Other Ambulatory Visit: Payer: Self-pay

## 2019-01-13 ENCOUNTER — Encounter (HOSPITAL_COMMUNITY): Payer: Self-pay

## 2019-01-13 DIAGNOSIS — Z01812 Encounter for preprocedural laboratory examination: Secondary | ICD-10-CM | POA: Diagnosis present

## 2019-01-13 HISTORY — DX: Prediabetes: R73.03

## 2019-01-13 HISTORY — DX: Sleep apnea, unspecified: G47.30

## 2019-01-13 LAB — BASIC METABOLIC PANEL
Anion gap: 9 (ref 5–15)
BUN: 18 mg/dL (ref 8–23)
CO2: 23 mmol/L (ref 22–32)
Calcium: 9.5 mg/dL (ref 8.9–10.3)
Chloride: 105 mmol/L (ref 98–111)
Creatinine, Ser: 0.68 mg/dL (ref 0.44–1.00)
GFR calc Af Amer: >60 mL/min (ref >60)
GFR calc non Af Amer: >60 mL/min (ref >60)
Glucose, Bld: 99 mg/dL (ref 70–99)
Potassium: 4.4 mmol/L (ref 3.5–5.1)
Sodium: 137 mmol/L (ref 135–145)

## 2019-01-13 LAB — CBC
HCT: 39.1 % (ref 36.0–46.0)
Hemoglobin: 12.8 g/dL (ref 12.0–15.0)
MCH: 30.9 pg (ref 26.0–34.0)
MCHC: 32.7 g/dL (ref 30.0–36.0)
MCV: 94.4 fL (ref 80.0–100.0)
Platelets: 206 10*3/uL (ref 150–400)
RBC: 4.14 MIL/uL (ref 3.87–5.11)
RDW: 12.4 % (ref 11.5–15.5)
WBC: 5.2 10*3/uL (ref 4.0–10.5)
nRBC: 0 % (ref 0.0–0.2)

## 2019-01-13 LAB — TYPE AND SCREEN
ABO/RH(D): O POS
Antibody Screen: NEGATIVE

## 2019-01-13 LAB — GLUCOSE, CAPILLARY: Glucose-Capillary: 109 mg/dL — ABNORMAL HIGH (ref 70–99)

## 2019-01-13 LAB — ABO/RH: ABO/RH(D): O POS

## 2019-01-13 LAB — SURGICAL PCR SCREEN
MRSA, PCR: NEGATIVE
Staphylococcus aureus: NEGATIVE

## 2019-01-13 NOTE — Patient Instructions (Addendum)
DUE TO COVID-19 ONLY ONE VISITOR IS ALLOWED TO COME WITH YOU AND STAY IN THE WAITING ROOM ONLY DURING PRE OP AND PROCEDURE DAY OF SURGERY. THE 1 VISITOR MAY VISIT WITH YOU AFTER SURGERY IN YOUR PRIVATE ROOM DURING VISITING HOURS ONLY!  YOU NEED TO HAVE A COVID 19 TEST ON Friday 01/20/2019 @ 0820, THIS TEST MUST BE DONE BEFORE SURGERY, COME  [place], [place] , [address].  ([place]) ONCE YOUR COVID TEST IS COMPLETED, PLEASE BEGIN THE QUARANTINE INSTRUCTIONS AS OUTLINED IN YOUR HANDOUT.                Jillian [name]  01/13/2019   Your procedure is scheduled on: Tuesday 01/24/2019   Report to [facility] Main  Entrance   Report to admitting at 10:00 am      Call this number if you have problems the morning of surgery [phone]    Remember: Do not eat food after midnight. May take clear liquid diet till 0930 day of surgery and also comsume entire G2 drink at 0930 day of surgery then nothing by mouth.    CLEAR LIQUID DIET   Foods Allowed                                                                     Foods Excluded  Coffee and tea, regular and decaf                             liquids that you cannot  Plain Jell-O any favor except red or purple                                           see through such as: Fruit ices (not with fruit pulp)                                     milk, soups, orange juice  Iced Popsicles                                    All solid food Carbonated beverages, regular and diet                                    Cranberry, grape and apple juices Sports drinks like Gatorade Lightly seasoned clear broth or consume(fat free) Sugar, honey syrup  Sample Menu Breakfast                                Lunch                                     Supper Cranberry juice  Beef broth                            Chicken broth Jell-O                                     Grape juice                           Apple  juice Coffee or tea                        Jell-O                                      Popsicle                                                Coffee or tea                        Coffee or tea  _____________________________________________________________________   BRUSH YOUR TEETH MORNING OF SURGERY AND RINSE YOUR MOUTH OUT, NO CHEWING GUM CANDY OR MINTS.     Take these medicines the morning of surgery with A SIP OF WATER: Bupropion (Wellbutrin); Duloxetine (Cymbalta); Pregabalin (Lyrica)                                 You may not have any metal on your body including hair pins and              piercings  Do not wear jewelry, make-up, lotions, powders or perfumes, deodorant             Do not wear nail polish on your fingernails.  Do not shave  48 hours prior to surgery.               Do not bring valuables to the hospital. [place].  Contacts, dentures or bridgework may not be worn into surgery.  Leave suitcase in the car. After surgery it may be brought to your room.     Patients discharged the day of surgery will not be allowed to drive home. IF YOU ARE HAVING SURGERY AND GOING HOME THE SAME DAY, YOU MUST HAVE AN ADULT TO DRIVE YOU HOME AND BE WITH YOU FOR 24 HOURS. YOU MAY GO HOME BY TAXI OR UBER OR ORTHERWISE, BUT AN ADULT MUST ACCOMPANY YOU HOME AND STAY WITH YOU FOR 24 HOURS.    Special Instructions: BRING CPAP MASK AND TUBING DAY OF SURGERY               Please read over the following fact sheets you were given:MRSA INFORMATION SHEET  _____________________________________________________________________  Incentive Spirometer  An incentive spirometer is a tool that can help keep your lungs clear and active. This tool measures how well you are filling your lungs with each breath. Taking long deep breaths may help reverse or  decrease the chance of developing breathing (pulmonary) problems (especially infection)  following:  A long period of time when you are unable to move or be active. BEFORE THE PROCEDURE   If the spirometer includes an indicator to show your best effort, your nurse or respiratory therapist will set it to a desired goal.  If possible, sit up straight or lean slightly forward. Try not to slouch.  Hold the incentive spirometer in an upright position. INSTRUCTIONS FOR USE  1. Sit on the edge of your bed if possible, or sit up as far as you can in bed or on a chair. 2. Hold the incentive spirometer in an upright position. 3. Breathe out normally. 4. Place the mouthpiece in your mouth and seal your lips tightly around it. 5. Breathe in slowly and as deeply as possible, raising the piston or the ball toward the top of the column. 6. Hold your breath for 3-5 seconds or for as long as possible. Allow the piston or ball to fall to the bottom of the column. 7. Remove the mouthpiece from your mouth and breathe out normally. 8. Rest for a few seconds and repeat Steps 1 through 7 at least 10 times every 1-2 hours when you are awake. Take your time and take a few normal breaths between deep breaths. 9. The spirometer may include an indicator to show your best effort. Use the indicator as a goal to work toward during each repetition. 10. After each set of 10 deep breaths, practice coughing to be sure your lungs are clear. If you have an incision (the cut made at the time of surgery), support your incision when coughing by placing a pillow or rolled up towels firmly against it. Once you are able to get out of bed, walk around indoors and cough well. You may stop using the incentive spirometer when instructed by your caregiver.  RISKS AND COMPLICATIONS  Take your time so you do not get dizzy or light-headed.  If you are in pain, you may need to take or ask for pain medication before doing incentive spirometry. It is harder to take a deep breath if you are having pain. AFTER USE  Rest and  breathe slowly and easily.  It can be helpful to keep track of a log of your progress. Your caregiver can provide you with a simple table to help with this. If you are using the spirometer at home, follow these instructions: [place] IF:   You are having difficultly using the spirometer.  You have trouble using the spirometer as often as instructed.  Your pain medication is not giving enough relief while using the spirometer.  You develop fever of 100.5 F (38.1 C) or higher. SEEK IMMEDIATE MEDICAL CARE IF:   You cough up bloody sputum that had not been present before.  You develop fever of 102 F (38.9 C) or greater.  You develop worsening pain at or near the incision site. MAKE SURE YOU:   Understand these instructions.  Will watch your condition.  Will get help right away if you are not doing well or get worse. Document Released: 07/27/2006 Document Revised: 06/08/2011 Document Reviewed: 09/27/2006 ExitCare Patient Information 2014 [place].   ________________________________________________________________________ [facility] - Preparing for Surgery Before surgery, you can play an important role.  Because skin is not sterile, your skin needs to be as free of germs as possible.  You can reduce the number of germs on your skin by washing with CHG (  chlorahexidine gluconate) soap before surgery.  CHG is an antiseptic cleaner which kills germs and bonds with the skin to continue killing germs even after washing. Please DO NOT use if you have an allergy to CHG or antibacterial soaps.  If your skin becomes reddened/irritated stop using the CHG and inform your nurse when you arrive at Short Stay. Do not shave (including legs and underarms) for at least 48 hours prior to the first CHG shower.  You may shave your face/neck. Please follow these instructions carefully:  1.  Shower with CHG Soap the night before surgery and the  morning of Surgery.  2.  If you choose to  wash your hair, wash your hair first as usual with your  normal  shampoo.  3.  After you shampoo, rinse your hair and body thoroughly to remove the  shampoo.                           4.  Use CHG as you would any other liquid soap.  You can apply chg directly  to the skin and wash                       Gently with a scrungie or clean washcloth.  5.  Apply the CHG Soap to your body ONLY FROM THE NECK DOWN.   Do not use on face/ open                           Wound or open sores. Avoid contact with eyes, ears mouth and genitals (private parts).                       Wash face,  Genitals (private parts) with your normal soap.             6.  Wash thoroughly, paying special attention to the area where your surgery  will be performed.  7.  Thoroughly rinse your body with warm water from the neck down.  8.  DO NOT shower/wash with your normal soap after using and rinsing off  the CHG Soap.                9.  Pat yourself dry with a clean towel.            10.  Wear clean pajamas.            11.  Place clean sheets on your bed the night of your first shower and do not  sleep with pets. Day of Surgery : Do not apply any lotions/deodorants the morning of surgery.  Please wear clean clothes to the hospital/surgery center.  FAILURE TO FOLLOW THESE INSTRUCTIONS MAY RESULT IN THE CANCELLATION OF YOUR SURGERY PATIENT SIGNATURE_________________________________  NURSE SIGNATURE__________________________________  ________________________________________________________________________

## 2019-01-16 ENCOUNTER — Encounter (HOSPITAL_COMMUNITY): Payer: Self-pay | Admitting: Physician Assistant

## 2019-01-16 ENCOUNTER — Encounter (HOSPITAL_COMMUNITY): Payer: Self-pay | Admitting: Certified Registered Nurse Anesthetist

## 2019-01-19 ENCOUNTER — Other Ambulatory Visit: Payer: Self-pay | Admitting: Family Medicine

## 2019-01-20 ENCOUNTER — Other Ambulatory Visit (HOSPITAL_COMMUNITY)
Admission: RE | Admit: 2019-01-20 | Discharge: 2019-01-20 | Disposition: A | Discharge status: Home / Self Care | Payer: PRIVATE HEALTH INSURANCE | Source: Ambulatory Visit | Attending: Orthopedic Surgery | Admitting: Orthopedic Surgery

## 2019-01-20 DIAGNOSIS — Z01812 Encounter for preprocedural laboratory examination: Secondary | ICD-10-CM | POA: Diagnosis present

## 2019-01-20 DIAGNOSIS — Z20828 Contact with and (suspected) exposure to other viral communicable diseases: Secondary | ICD-10-CM | POA: Diagnosis not present

## 2019-01-21 LAB — NOVEL CORONAVIRUS, NAA (HOSP ORDER, SEND-OUT TO REF LAB; TAT 18-24 HRS): SARS-CoV-2, NAA: NOT DETECTED

## 2019-01-24 ENCOUNTER — Encounter (HOSPITAL_COMMUNITY): Payer: Self-pay | Admitting: *Deleted

## 2019-01-24 ENCOUNTER — Encounter (HOSPITAL_COMMUNITY)
Admission: RE | Disposition: A | Discharge status: Home / Self Care | Payer: Self-pay | Source: Home / Self Care | Attending: Orthopedic Surgery

## 2019-01-24 LAB — GLUCOSE, CAPILLARY: Glucose-Capillary: 93 mg/dL (ref 70–99)

## 2019-01-24 SURGERY — ARTHROPLASTY, KNEE, UNICOMPARTMENTAL
Anesthesia: Choice | Site: Knee | Laterality: Right

## 2019-01-24 MED ORDER — LACTATED RINGERS IV SOLN
INTRAVENOUS | Status: DC
  Administered 2019-01-24 – 2019-02-07 (×3): via INTRAVENOUS

## 2019-01-24 MED ORDER — PROPOFOL 10 MG/ML IV BOLUS
INTRAVENOUS | Status: AC
  Filled 2019-01-24: qty 40

## 2019-01-24 MED ORDER — CEFAZOLIN SODIUM-DEXTROSE 2-4 GM/100ML-% IV SOLN
2.0000 g | INTRAVENOUS | Status: AC
  Filled 2019-01-24: qty 100

## 2019-01-24 MED ORDER — CHLORHEXIDINE GLUCONATE 4 % EX LIQD: 60.0000 mL | Freq: Once | CUTANEOUS | Status: DC

## 2019-01-24 MED ORDER — MIDAZOLAM HCL 2 MG/2ML IJ SOLN
1.0000 mg | Freq: Once | INTRAMUSCULAR | Status: AC
  Administered 2019-02-07: 1 mg via INTRAVENOUS
  Filled 2019-01-24: qty 2

## 2019-01-24 MED ORDER — ONDANSETRON HCL 4 MG/2ML IJ SOLN
INTRAMUSCULAR | Status: AC
  Filled 2019-01-24: qty 2

## 2019-01-24 MED ORDER — ACETAMINOPHEN 500 MG PO TABS
1000.0000 mg | ORAL_TABLET | Freq: Once | ORAL | Status: AC
  Administered 2019-01-24: 1000 mg via ORAL
  Filled 2019-01-24: qty 2

## 2019-01-24 MED ORDER — TRANEXAMIC ACID-NACL 1000-0.7 MG/100ML-% IV SOLN
1000.0000 mg | INTRAVENOUS | Status: AC
  Filled 2019-01-24: qty 100

## 2019-01-24 MED ORDER — FENTANYL CITRATE (PF) 100 MCG/2ML IJ SOLN
50.0000 ug | Freq: Once | INTRAMUSCULAR | Status: AC
  Administered 2019-02-07: 50 ug via INTRAVENOUS
  Filled 2019-01-24: qty 2

## 2019-01-24 MED ORDER — POVIDONE-IODINE 10 % EX SWAB
2.0000 "application " | Freq: Once | CUTANEOUS | Status: AC
  Administered 2019-01-24: 2 via TOPICAL

## 2019-01-24 NOTE — H&P (Signed)
Jillian [name] presented today, apparently having just had a scabies infection.  She has red excoriations with skin breaks within her surgical field on the right lower extremity.  I have recommended postponing surgical intervention until these have healed, and to make sure that her scabies outbreak has been completely under control.  We will plan to reschedule her accordingly.  We have already rescheduled her once because she had a staph infection on her face requiring hospitalization, we went through the decolonization process and any treatment for that, but unfortunately she has again presented the day of surgery with a skin lesion that is too concerning to proceed.

## 2019-01-24 NOTE — Progress Notes (Signed)
Upon Dr. Luanna Cole assessment, pt had previous healing from skin infection on right knee. Discussed with patient and agrees that cancellation of procedure today would be the best option. Pt plans to follow up with office to reschedule procedure.

## 2019-01-24 NOTE — Anesthesia Preprocedure Evaluation (Deleted)
Anesthesia Evaluation    Airway        Dental   Pulmonary former smoker,          Cardiovascular     Neuro/Psych    GI/Hepatic   Endo/Other    Renal/GU      Musculoskeletal   Abdominal   Peds  Hematology   Anesthesia Other Findings   Reproductive/Obstetrics                             Anesthesia Physical Anesthesia Plan Anesthesia Quick Evaluation  

## 2019-01-26 NOTE — Patient Instructions (Addendum)
DUE TO COVID-19 ONLY ONE VISITOR IS ALLOWED TO COME WITH YOU AND STAY IN THE WAITING ROOM ONLY DURING PRE OP AND PROCEDURE DAY OF SURGERY. THE 1 VISITOR MAY VISIT WITH YOU AFTER SURGERY IN YOUR PRIVATE ROOM DURING VISITING HOURS ONLY!  YOU NEED TO HAVE A COVID 19 TEST ON___11/6/20____ @_10 :55______, THIS TEST MUST BE DONE BEFORE SURGERY, COME  [address], [place] , [address].  ([place]) ONCE YOUR COVID TEST IS COMPLETED, PLEASE BEGIN THE QUARANTINE INSTRUCTIONS AS OUTLINED IN YOUR HANDOUT.                ARABELLA MANOCCHIO    Your procedure is scheduled on: 02/07/19   Report to [facility] Main  Entrance   Report to admitting at 10:50 AM     Call this number if you have problems the morning of surgery [phone]    Remember: Do not eat food or drink liquids :After Midnight.   BRUSH YOUR TEETH MORNING OF SURGERY AND RINSE YOUR MOUTH OUT, NO CHEWING GUM CANDY OR MINTS.     Take these medicines the morning of surgery with A SIP OF WATER: Wellbutrin, Cymbalta, Lyrica                                You may not have any metal on your body including hair pins and              piercings  Do not wear jewelry, make-up, lotions, powders or perfumes, deodorant             Do not wear nail polish on your fingernails.  Do not shave  48 hours prior to surgery.     Do not bring valuables to the hospital. [place].  Contacts, dentures or bridgework may not be worn into surgery.       Special Instructions: N/A              Please read over the following fact sheets you were given: _____________________________________________________________________             [facility] - Preparing for Surgery  Before surgery, you can play an important role.   Because skin is not sterile, your skin needs to be as free of germs as possible.   You can reduce the number of germs on your skin by washing with CHG  (chlorahexidine gluconate) soap before surgery.   CHG is an antiseptic cleaner which kills germs and bonds with the skin to continue killing germs even after washing. Please DO NOT use if you have an allergy to CHG or antibacterial soaps .  If your skin becomes reddened/irritated stop using the CHG and inform your nurse when you arrive at Short Stay. Do not shave (including legs and underarms) for at least 48 hours prior to the first CHG shower.    Please follow these instructions carefully:  1.  Shower with CHG Soap the night before surgery and the  morning of Surgery.  2.  If you choose to wash your hair, wash your hair first as usual with your  normal  shampoo.  3.  After you shampoo, rinse your hair and body thoroughly to remove the  shampoo.  4.  Use CHG as you would any other liquid soap.  You can apply chg directly  to the skin and wash                       Gently with a scrungie or clean washcloth.  5.  Apply the CHG Soap to your body ONLY FROM THE NECK DOWN.   Do not use on face/ open                           Wound or open sores. Avoid contact with eyes, ears mouth and genitals (private parts).                       Wash face,  Genitals (private parts) with your normal soap.             6.  Wash thoroughly, paying special attention to the area where your surgery  will be performed.  7.  Thoroughly rinse your body with warm water from the neck down.  8.  DO NOT shower/wash with your normal soap after using and rinsing off  the CHG Soap.             9.  Pat yourself dry with a clean towel.            10.  Wear clean pajamas.            11.  Place clean sheets on your bed the night of your first shower and do not  sleep with pets. Day of Surgery : Do not apply any lotions/deodorants the morning of surgery.  Please wear clean clothes to the hospital/surgery center.  FAILURE TO FOLLOW THESE INSTRUCTIONS MAY RESULT IN THE CANCELLATION OF YOUR  SURGERY PATIENT SIGNATURE_________________________________  NURSE SIGNATURE__________________________________  ________________________________________________________________________

## 2019-01-30 ENCOUNTER — Other Ambulatory Visit: Payer: Self-pay

## 2019-01-30 ENCOUNTER — Encounter (HOSPITAL_COMMUNITY): Payer: Self-pay

## 2019-01-30 ENCOUNTER — Encounter (HOSPITAL_COMMUNITY)
Admission: RE | Admit: 2019-01-30 | Discharge: 2019-01-30 | Disposition: A | Discharge status: Home / Self Care | Payer: PRIVATE HEALTH INSURANCE | Source: Home / Self Care | Attending: Orthopedic Surgery | Admitting: Orthopedic Surgery

## 2019-01-30 DIAGNOSIS — Z8249 Family history of ischemic heart disease and other diseases of the circulatory system: Secondary | ICD-10-CM | POA: Diagnosis not present

## 2019-01-30 DIAGNOSIS — Z79899 Other long term (current) drug therapy: Secondary | ICD-10-CM | POA: Diagnosis not present

## 2019-01-30 DIAGNOSIS — M797 Fibromyalgia: Secondary | ICD-10-CM | POA: Diagnosis not present

## 2019-01-30 DIAGNOSIS — Z20828 Contact with and (suspected) exposure to other viral communicable diseases: Secondary | ICD-10-CM | POA: Diagnosis not present

## 2019-01-30 DIAGNOSIS — Z6834 Body mass index (BMI) 34.0-34.9, adult: Secondary | ICD-10-CM | POA: Diagnosis not present

## 2019-01-30 DIAGNOSIS — Z833 Family history of diabetes mellitus: Secondary | ICD-10-CM | POA: Diagnosis not present

## 2019-01-30 DIAGNOSIS — M25761 Osteophyte, right knee: Secondary | ICD-10-CM | POA: Diagnosis not present

## 2019-01-30 DIAGNOSIS — Z87891 Personal history of nicotine dependence: Secondary | ICD-10-CM | POA: Diagnosis not present

## 2019-01-30 DIAGNOSIS — E669 Obesity, unspecified: Secondary | ICD-10-CM | POA: Diagnosis not present

## 2019-01-30 DIAGNOSIS — F419 Anxiety disorder, unspecified: Secondary | ICD-10-CM | POA: Diagnosis not present

## 2019-01-30 DIAGNOSIS — M1711 Unilateral primary osteoarthritis, right knee: Secondary | ICD-10-CM | POA: Diagnosis not present

## 2019-01-30 DIAGNOSIS — Z85828 Personal history of other malignant neoplasm of skin: Secondary | ICD-10-CM | POA: Diagnosis not present

## 2019-01-30 DIAGNOSIS — Z800 Family history of malignant neoplasm of digestive organs: Secondary | ICD-10-CM | POA: Diagnosis not present

## 2019-01-30 DIAGNOSIS — G4730 Sleep apnea, unspecified: Secondary | ICD-10-CM | POA: Diagnosis not present

## 2019-01-30 HISTORY — DX: Fibromyalgia: M79.7

## 2019-01-30 LAB — SURGICAL PCR SCREEN
MRSA, PCR: NEGATIVE
Staphylococcus aureus: NEGATIVE

## 2019-01-30 LAB — CBC
HCT: 38.4 % (ref 36.0–46.0)
Hemoglobin: 12.5 g/dL (ref 12.0–15.0)
MCH: 30.9 pg (ref 26.0–34.0)
MCHC: 32.6 g/dL (ref 30.0–36.0)
MCV: 95 fL (ref 80.0–100.0)
Platelets: 194 10*3/uL (ref 150–400)
RBC: 4.04 MIL/uL (ref 3.87–5.11)
RDW: 12.4 % (ref 11.5–15.5)
WBC: 6.7 10*3/uL (ref 4.0–10.5)
nRBC: 0 % (ref 0.0–0.2)

## 2019-01-30 LAB — HEMOGLOBIN A1C
Hgb A1c MFr Bld: 5.5 % (ref 4.8–5.6)
Mean Plasma Glucose: 111.15 mg/dL

## 2019-01-30 NOTE — Progress Notes (Signed)
PCP - Dr. Diona Browner Cardiologist - none  Chest x-ray - no EKG - 12/23/18 Stress Test - no ECHO - no Cardiac Cath - no  Sleep Study - yes CPAP - yes  Fasting Blood Sugar - NA Checks Blood Sugar _____ times a day  Blood Thinner Instructions: NA Aspirin Instructions: Last Dose:  Anesthesia review:   Patient denies shortness of breath, fever, cough and chest pain at PAT appointment yes  Patient verbalized understanding of instructions that were given to them at the PAT appointment. Patient was also instructed that they will need to review over the PAT instructions again at home before surgery. Yes Pt reports that she does have a small open area on her Rt knee. She will see Dr. Mardelle Matte prior to surgery in his office.

## 2019-02-01 ENCOUNTER — Other Ambulatory Visit: Payer: Self-pay | Admitting: Family Medicine

## 2019-02-02 NOTE — Telephone Encounter (Signed)
Last office visit 12/23/2018 for hospital follow up.  Last refilled 01/05/2019 for #180 with no refills.  CPE scheduled for 03/10/2019.

## 2019-02-03 ENCOUNTER — Other Ambulatory Visit (HOSPITAL_COMMUNITY): Payer: Self-pay

## 2019-02-03 ENCOUNTER — Ambulatory Visit (HOSPITAL_COMMUNITY): Payer: PRIVATE HEALTH INSURANCE

## 2019-02-03 DIAGNOSIS — M1711 Unilateral primary osteoarthritis, right knee: Secondary | ICD-10-CM | POA: Diagnosis not present

## 2019-02-04 LAB — NOVEL CORONAVIRUS, NAA (HOSP ORDER, SEND-OUT TO REF LAB; TAT 18-24 HRS): SARS-CoV-2, NAA: NOT DETECTED

## 2019-02-07 ENCOUNTER — Observation Stay (HOSPITAL_COMMUNITY): Payer: PRIVATE HEALTH INSURANCE

## 2019-02-07 ENCOUNTER — Inpatient Hospital Stay (HOSPITAL_COMMUNITY)
Admission: RE | Admit: 2019-02-07 | Payer: PRIVATE HEALTH INSURANCE | Source: Other Acute Inpatient Hospital | Admitting: Orthopedic Surgery

## 2019-02-07 ENCOUNTER — Other Ambulatory Visit: Payer: Self-pay

## 2019-02-07 ENCOUNTER — Encounter (HOSPITAL_COMMUNITY)
Admission: RE | Disposition: A | Discharge status: Home / Self Care | Payer: Self-pay | Source: Home / Self Care | Attending: Orthopedic Surgery

## 2019-02-07 ENCOUNTER — Encounter (HOSPITAL_COMMUNITY): Payer: Self-pay | Admitting: *Deleted

## 2019-02-07 ENCOUNTER — Ambulatory Visit (HOSPITAL_COMMUNITY): Payer: PRIVATE HEALTH INSURANCE | Admitting: Anesthesiology

## 2019-02-07 ENCOUNTER — Observation Stay (HOSPITAL_COMMUNITY)
Admission: RE | Admit: 2019-02-07 | Discharge: 2019-02-08 | Disposition: A | Discharge status: Home / Self Care | Payer: PRIVATE HEALTH INSURANCE | Attending: Orthopedic Surgery | Admitting: Orthopedic Surgery

## 2019-02-07 DIAGNOSIS — Z8249 Family history of ischemic heart disease and other diseases of the circulatory system: Secondary | ICD-10-CM | POA: Insufficient documentation

## 2019-02-07 DIAGNOSIS — M25761 Osteophyte, right knee: Secondary | ICD-10-CM | POA: Insufficient documentation

## 2019-02-07 DIAGNOSIS — G4730 Sleep apnea, unspecified: Secondary | ICD-10-CM | POA: Insufficient documentation

## 2019-02-07 DIAGNOSIS — Z6834 Body mass index (BMI) 34.0-34.9, adult: Secondary | ICD-10-CM | POA: Insufficient documentation

## 2019-02-07 DIAGNOSIS — M1711 Unilateral primary osteoarthritis, right knee: Secondary | ICD-10-CM | POA: Diagnosis not present

## 2019-02-07 DIAGNOSIS — M797 Fibromyalgia: Secondary | ICD-10-CM | POA: Insufficient documentation

## 2019-02-07 DIAGNOSIS — Z833 Family history of diabetes mellitus: Secondary | ICD-10-CM | POA: Insufficient documentation

## 2019-02-07 DIAGNOSIS — E669 Obesity, unspecified: Secondary | ICD-10-CM | POA: Insufficient documentation

## 2019-02-07 DIAGNOSIS — Z96651 Presence of right artificial knee joint: Secondary | ICD-10-CM

## 2019-02-07 DIAGNOSIS — Z800 Family history of malignant neoplasm of digestive organs: Secondary | ICD-10-CM | POA: Insufficient documentation

## 2019-02-07 DIAGNOSIS — F419 Anxiety disorder, unspecified: Secondary | ICD-10-CM | POA: Insufficient documentation

## 2019-02-07 DIAGNOSIS — Z20828 Contact with and (suspected) exposure to other viral communicable diseases: Secondary | ICD-10-CM | POA: Insufficient documentation

## 2019-02-07 DIAGNOSIS — Z79899 Other long term (current) drug therapy: Secondary | ICD-10-CM | POA: Insufficient documentation

## 2019-02-07 DIAGNOSIS — Z85828 Personal history of other malignant neoplasm of skin: Secondary | ICD-10-CM | POA: Insufficient documentation

## 2019-02-07 DIAGNOSIS — Z87891 Personal history of nicotine dependence: Secondary | ICD-10-CM | POA: Insufficient documentation

## 2019-02-07 HISTORY — DX: Unilateral primary osteoarthritis, right knee: M17.11

## 2019-02-07 HISTORY — PX: PARTIAL KNEE ARTHROPLASTY: SHX2174

## 2019-02-07 LAB — GLUCOSE, CAPILLARY: Glucose-Capillary: 116 mg/dL — ABNORMAL HIGH (ref 70–99)

## 2019-02-07 SURGERY — ARTHROPLASTY, KNEE, UNICOMPARTMENTAL
Anesthesia: Spinal | Site: Knee | Laterality: Right

## 2019-02-07 MED ORDER — DIPHENHYDRAMINE HCL 12.5 MG/5ML PO ELIX: 12.5000 mg | ORAL_SOLUTION | ORAL | Status: DC | PRN

## 2019-02-07 MED ORDER — ASPIRIN EC 325 MG PO TBEC
325.0000 mg | DELAYED_RELEASE_TABLET | Freq: Two times a day (BID) | ORAL | Status: DC
  Administered 2019-02-08: 09:00:00 325 mg via ORAL
  Filled 2019-02-07: qty 1

## 2019-02-07 MED ORDER — ROPIVACAINE HCL 5 MG/ML IJ SOLN
INTRAMUSCULAR | Status: DC | PRN
  Administered 2019-02-07 (×2): 5 mL via PERINEURAL

## 2019-02-07 MED ORDER — PROPOFOL 500 MG/50ML IV EMUL
INTRAVENOUS | Status: AC
  Filled 2019-02-07: qty 50

## 2019-02-07 MED ORDER — KETOROLAC TROMETHAMINE 30 MG/ML IJ SOLN: 30.0000 mg | Freq: Once | INTRAMUSCULAR | Status: DC | PRN

## 2019-02-07 MED ORDER — DULOXETINE HCL 60 MG PO CPEP
90.0000 mg | ORAL_CAPSULE | Freq: Every day | ORAL | Status: DC
  Administered 2019-02-08: 09:00:00 90 mg via ORAL
  Filled 2019-02-07: qty 1

## 2019-02-07 MED ORDER — PHENYLEPHRINE HCL (PRESSORS) 10 MG/ML IV SOLN
INTRAVENOUS | Status: DC | PRN
  Administered 2019-02-07: 80 ug via INTRAVENOUS
  Administered 2019-02-07: 40 ug via INTRAVENOUS

## 2019-02-07 MED ORDER — BUPIVACAINE IN DEXTROSE 0.75-8.25 % IT SOLN
INTRATHECAL | Status: DC | PRN
  Administered 2019-02-07: 1.6 mL via INTRATHECAL

## 2019-02-07 MED ORDER — OMEGA-3 FATTY ACIDS 1000 MG PO CAPS: 3000.0000 mg | ORAL_CAPSULE | Freq: Every day | ORAL | Status: DC

## 2019-02-07 MED ORDER — BUPROPION HCL ER (XL) 150 MG PO TB24
150.0000 mg | ORAL_TABLET | Freq: Every day | ORAL | Status: DC
  Administered 2019-02-08: 09:00:00 150 mg via ORAL
  Filled 2019-02-07: qty 1

## 2019-02-07 MED ORDER — HYDROMORPHONE HCL 1 MG/ML IJ SOLN: 0.2500 mg | INTRAMUSCULAR | Status: DC | PRN

## 2019-02-07 MED ORDER — CHOLECALCIFEROL 10 MCG (400 UNIT) PO TABS
400.0000 [IU] | ORAL_TABLET | Freq: Two times a day (BID) | ORAL | Status: DC
  Administered 2019-02-07 – 2019-02-08 (×2): 400 [IU] via ORAL
  Filled 2019-02-07 (×2): qty 1

## 2019-02-07 MED ORDER — SENNA-DOCUSATE SODIUM 8.6-50 MG PO TABS: 2.0000 | ORAL_TABLET | Freq: Every day | ORAL | 1 refills | Status: DC

## 2019-02-07 MED ORDER — CO Q 10 100 MG PO CAPS: 100.0000 mg | ORAL_CAPSULE | Freq: Every day | ORAL | Status: DC

## 2019-02-07 MED ORDER — KETOROLAC TROMETHAMINE 30 MG/ML IJ SOLN
INTRAMUSCULAR | Status: AC
  Filled 2019-02-07: qty 1

## 2019-02-07 MED ORDER — CEFAZOLIN SODIUM-DEXTROSE 2-4 GM/100ML-% IV SOLN
2.0000 g | Freq: Four times a day (QID) | INTRAVENOUS | Status: AC
  Administered 2019-02-07 (×2): 2 g via INTRAVENOUS
  Filled 2019-02-07 (×2): qty 100

## 2019-02-07 MED ORDER — BUPIVACAINE HCL (PF) 0.75 % IJ SOLN
INTRAMUSCULAR | Status: DC | PRN
  Administered 2019-02-07: 1.6 mL via INTRATHECAL

## 2019-02-07 MED ORDER — CLONIDINE HCL (ANALGESIA) 100 MCG/ML EP SOLN
EPIDURAL | Status: DC | PRN
  Administered 2019-02-07: 100 ug

## 2019-02-07 MED ORDER — POVIDONE-IODINE 10 % EX SWAB
2.0000 "application " | Freq: Once | CUTANEOUS | Status: AC
  Administered 2019-02-07: 2 via TOPICAL

## 2019-02-07 MED ORDER — DOCUSATE SODIUM 100 MG PO CAPS
100.0000 mg | ORAL_CAPSULE | Freq: Two times a day (BID) | ORAL | Status: DC
  Administered 2019-02-07 – 2019-02-08 (×2): 100 mg via ORAL
  Filled 2019-02-07 (×2): qty 1

## 2019-02-07 MED ORDER — KETOROLAC TROMETHAMINE 30 MG/ML IJ SOLN
INTRAMUSCULAR | Status: DC | PRN
  Administered 2019-02-07: 30 mg

## 2019-02-07 MED ORDER — TRANEXAMIC ACID-NACL 1000-0.7 MG/100ML-% IV SOLN
1000.0000 mg | INTRAVENOUS | Status: AC
  Administered 2019-02-07: 1000 mg via INTRAVENOUS
  Filled 2019-02-07: qty 100

## 2019-02-07 MED ORDER — MAGNESIUM MALATE 1250 (141.7 MG) MG PO TABS: 141.7000 mg | ORAL_TABLET | ORAL | Status: DC

## 2019-02-07 MED ORDER — BUPIVACAINE HCL 0.25 % IJ SOLN
INTRAMUSCULAR | Status: DC | PRN
  Administered 2019-02-07: 30 mL

## 2019-02-07 MED ORDER — DEXAMETHASONE SODIUM PHOSPHATE 10 MG/ML IJ SOLN
10.0000 mg | Freq: Once | INTRAMUSCULAR | Status: DC
  Filled 2019-02-07: qty 1

## 2019-02-07 MED ORDER — MIDAZOLAM HCL 2 MG/2ML IJ SOLN
1.0000 mg | Freq: Once | INTRAMUSCULAR | Status: DC
  Filled 2019-02-07: qty 2

## 2019-02-07 MED ORDER — OXYCODONE HCL 5 MG PO TABS
5.0000 mg | ORAL_TABLET | ORAL | Status: DC | PRN
  Administered 2019-02-07 – 2019-02-08 (×4): 10 mg via ORAL
  Filled 2019-02-07 (×4): qty 2

## 2019-02-07 MED ORDER — BISACODYL 10 MG RE SUPP: 10.0000 mg | Freq: Every day | RECTAL | Status: DC | PRN

## 2019-02-07 MED ORDER — CEFAZOLIN SODIUM-DEXTROSE 2-4 GM/100ML-% IV SOLN
2.0000 g | INTRAVENOUS | Status: AC
  Administered 2019-02-07: 2 g via INTRAVENOUS
  Filled 2019-02-07: qty 100

## 2019-02-07 MED ORDER — ONDANSETRON HCL 4 MG/2ML IJ SOLN: 4.0000 mg | Freq: Four times a day (QID) | INTRAMUSCULAR | Status: DC | PRN

## 2019-02-07 MED ORDER — OMEGA-3-ACID ETHYL ESTERS 1 G PO CAPS
1.0000 g | ORAL_CAPSULE | Freq: Two times a day (BID) | ORAL | Status: DC
  Administered 2019-02-08: 09:00:00 1 g via ORAL
  Filled 2019-02-07: qty 1

## 2019-02-07 MED ORDER — 0.9 % SODIUM CHLORIDE (POUR BTL) OPTIME
TOPICAL | Status: DC | PRN
  Administered 2019-02-07: 1000 mL

## 2019-02-07 MED ORDER — PROMETHAZINE HCL 25 MG/ML IJ SOLN: 6.2500 mg | INTRAMUSCULAR | Status: DC | PRN

## 2019-02-07 MED ORDER — MENTHOL 3 MG MT LOZG: 1.0000 | LOZENGE | OROMUCOSAL | Status: DC | PRN

## 2019-02-07 MED ORDER — PROPOFOL 10 MG/ML IV BOLUS
INTRAVENOUS | Status: AC
  Filled 2019-02-07: qty 60

## 2019-02-07 MED ORDER — ALUM & MAG HYDROXIDE-SIMETH 200-200-20 MG/5ML PO SUSP: 30.0000 mL | ORAL | Status: DC | PRN

## 2019-02-07 MED ORDER — DEXAMETHASONE SODIUM PHOSPHATE 10 MG/ML IJ SOLN
INTRAMUSCULAR | Status: DC | PRN
  Administered 2019-02-07: 5 mg via INTRAVENOUS

## 2019-02-07 MED ORDER — OXYCODONE HCL 5 MG PO TABS: 10.0000 mg | ORAL_TABLET | ORAL | Status: DC | PRN

## 2019-02-07 MED ORDER — TRANEXAMIC ACID-NACL 1000-0.7 MG/100ML-% IV SOLN
1000.0000 mg | Freq: Once | INTRAVENOUS | Status: AC
  Administered 2019-02-07: 16:00:00 1000 mg via INTRAVENOUS
  Filled 2019-02-07: qty 100

## 2019-02-07 MED ORDER — PREGABALIN 75 MG PO CAPS
150.0000 mg | ORAL_CAPSULE | Freq: Three times a day (TID) | ORAL | Status: DC
  Administered 2019-02-07 – 2019-02-08 (×2): 150 mg via ORAL
  Filled 2019-02-07 (×2): qty 2

## 2019-02-07 MED ORDER — ONDANSETRON HCL 4 MG/2ML IJ SOLN
INTRAMUSCULAR | Status: DC | PRN
  Administered 2019-02-07: 4 mg via INTRAVENOUS

## 2019-02-07 MED ORDER — PROPOFOL 500 MG/50ML IV EMUL
INTRAVENOUS | Status: DC | PRN
  Administered 2019-02-07: 75 ug/kg/min via INTRAVENOUS

## 2019-02-07 MED ORDER — MAGNESIUM CITRATE PO SOLN: 1.0000 | Freq: Once | ORAL | Status: DC | PRN

## 2019-02-07 MED ORDER — PHENOL 1.4 % MT LIQD: 1.0000 | OROMUCOSAL | Status: DC | PRN

## 2019-02-07 MED ORDER — ACETAMINOPHEN 500 MG PO TABS
1000.0000 mg | ORAL_TABLET | Freq: Four times a day (QID) | ORAL | Status: AC
  Administered 2019-02-07 – 2019-02-08 (×4): 1000 mg via ORAL
  Filled 2019-02-07 (×4): qty 2

## 2019-02-07 MED ORDER — FENTANYL CITRATE (PF) 100 MCG/2ML IJ SOLN
50.0000 ug | Freq: Once | INTRAMUSCULAR | Status: DC
  Filled 2019-02-07: qty 2

## 2019-02-07 MED ORDER — PHENYLEPHRINE 40 MCG/ML (10ML) SYRINGE FOR IV PUSH (FOR BLOOD PRESSURE SUPPORT)
PREFILLED_SYRINGE | INTRAVENOUS | Status: AC
  Filled 2019-02-07: qty 10

## 2019-02-07 MED ORDER — ACETAMINOPHEN 325 MG PO TABS: 325.0000 mg | ORAL_TABLET | Freq: Four times a day (QID) | ORAL | Status: DC | PRN

## 2019-02-07 MED ORDER — METOCLOPRAMIDE HCL 5 MG PO TABS: 5.0000 mg | ORAL_TABLET | Freq: Three times a day (TID) | ORAL | Status: DC | PRN

## 2019-02-07 MED ORDER — TRAMADOL HCL 50 MG PO TABS
50.0000 mg | ORAL_TABLET | Freq: Four times a day (QID) | ORAL | Status: DC
  Administered 2019-02-07 – 2019-02-08 (×4): 50 mg via ORAL
  Filled 2019-02-07 (×4): qty 1

## 2019-02-07 MED ORDER — METOCLOPRAMIDE HCL 5 MG/ML IJ SOLN: 5.0000 mg | Freq: Three times a day (TID) | INTRAMUSCULAR | Status: DC | PRN

## 2019-02-07 MED ORDER — LACTATED RINGERS IV SOLN
INTRAVENOUS | Status: DC
  Administered 2019-02-07: 08:00:00 via INTRAVENOUS

## 2019-02-07 MED ORDER — ONDANSETRON HCL 4 MG PO TABS: 4.0000 mg | ORAL_TABLET | Freq: Four times a day (QID) | ORAL | Status: DC | PRN

## 2019-02-07 MED ORDER — MEPERIDINE HCL 50 MG/ML IJ SOLN: 6.2500 mg | INTRAMUSCULAR | Status: DC | PRN

## 2019-02-07 MED ORDER — POLYETHYLENE GLYCOL 3350 17 G PO PACK: 17.0000 g | PACK | Freq: Every day | ORAL | Status: DC | PRN

## 2019-02-07 MED ORDER — ROPIVACAINE HCL 7.5 MG/ML IJ SOLN
INTRAMUSCULAR | Status: DC | PRN
  Administered 2019-02-07 (×4): 5 mL via PERINEURAL

## 2019-02-07 MED ORDER — METHOCARBAMOL 500 MG PO TABS
500.0000 mg | ORAL_TABLET | Freq: Four times a day (QID) | ORAL | Status: DC | PRN
  Administered 2019-02-07 – 2019-02-08 (×2): 500 mg via ORAL
  Filled 2019-02-07 (×2): qty 1

## 2019-02-07 MED ORDER — POTASSIUM CHLORIDE IN NACL 20-0.45 MEQ/L-% IV SOLN
INTRAVENOUS | Status: DC
  Administered 2019-02-07: 16:00:00 via INTRAVENOUS
  Filled 2019-02-07 (×2): qty 1000

## 2019-02-07 MED ORDER — HYDROMORPHONE HCL 1 MG/ML IJ SOLN
0.5000 mg | INTRAMUSCULAR | Status: DC | PRN
  Administered 2019-02-08: 03:00:00 1 mg via INTRAVENOUS
  Filled 2019-02-07: qty 1

## 2019-02-07 MED ORDER — STERILE WATER FOR IRRIGATION IR SOLN
Status: DC | PRN
  Administered 2019-02-07: 2000 mL

## 2019-02-07 MED ORDER — METHOCARBAMOL 1000 MG/10ML IJ SOLN
500.0000 mg | Freq: Four times a day (QID) | INTRAVENOUS | Status: DC | PRN
  Filled 2019-02-07: qty 5

## 2019-02-07 MED ORDER — ZOLPIDEM TARTRATE 5 MG PO TABS: 5.0000 mg | ORAL_TABLET | Freq: Every evening | ORAL | Status: DC | PRN

## 2019-02-07 MED ORDER — ASPIRIN EC 325 MG PO TBEC: 325.0000 mg | DELAYED_RELEASE_TABLET | Freq: Two times a day (BID) | ORAL | 0 refills | Status: DC

## 2019-02-07 MED ORDER — BUPIVACAINE HCL (PF) 0.25 % IJ SOLN
INTRAMUSCULAR | Status: AC
  Filled 2019-02-07: qty 30

## 2019-02-07 MED ORDER — ACETAMINOPHEN 500 MG PO TABS
1000.0000 mg | ORAL_TABLET | Freq: Once | ORAL | Status: AC
  Administered 2019-02-07: 08:00:00 1000 mg via ORAL
  Filled 2019-02-07: qty 2

## 2019-02-07 MED ORDER — FENTANYL CITRATE (PF) 100 MCG/2ML IJ SOLN
INTRAMUSCULAR | Status: AC
  Filled 2019-02-07: qty 2

## 2019-02-07 MED ORDER — OXYCODONE HCL 5 MG PO TABS: 5.0000 mg | ORAL_TABLET | ORAL | 0 refills | Status: DC | PRN

## 2019-02-07 MED ORDER — SODIUM CHLORIDE 0.9 % IR SOLN
Status: DC | PRN
  Administered 2019-02-07: 1000 mL

## 2019-02-07 MED ORDER — PROPOFOL 500 MG/50ML IV EMUL
INTRAVENOUS | Status: DC | PRN
  Administered 2019-02-07: 30 mg via INTRAVENOUS

## 2019-02-07 MED ORDER — CHLORHEXIDINE GLUCONATE 4 % EX LIQD: 60.0000 mL | Freq: Once | CUTANEOUS | Status: DC

## 2019-02-07 SURGICAL SUPPLY — 69 items
BAG SPEC THK2 15X12 ZIP CLS (MISCELLANEOUS) ×1
BAG ZIPLOCK 12X15 (MISCELLANEOUS) ×2 IMPLANT
BANDAGE ESMARK 6X9 LF (GAUZE/BANDAGES/DRESSINGS) ×1 IMPLANT
BEARING MENISCAL TIBIAL 4 MD R (Orthopedic Implant) ×1 IMPLANT
BLADE SURG 15 STRL LF DISP TIS (BLADE) ×1 IMPLANT
BLADE SURG 15 STRL SS (BLADE) ×2
BNDG CMPR 9X6 STRL LF SNTH (GAUZE/BANDAGES/DRESSINGS) ×1
BNDG CMPR MED 15X6 ELC VLCR LF (GAUZE/BANDAGES/DRESSINGS) ×1
BNDG ELASTIC 6X15 VLCR STRL LF (GAUZE/BANDAGES/DRESSINGS) ×2 IMPLANT
BNDG ESMARK 6X9 LF (GAUZE/BANDAGES/DRESSINGS) ×2
BOWL SMART MIX CTS (DISPOSABLE) ×2 IMPLANT
BRNG TIB MED 4 PHS 3 RT MEN (Orthopedic Implant) ×1 IMPLANT
CEMENT BONE R 1X40 (Cement) ×2 IMPLANT
CLSR STERI-STRIP ANTIMIC 1/2X4 (GAUZE/BANDAGES/DRESSINGS) ×2 IMPLANT
COMPONENT TIBIAL OXFRD MEDL RT (Orthopedic Implant) IMPLANT
COVER SURGICAL LIGHT HANDLE (MISCELLANEOUS) ×2 IMPLANT
COVER WAND RF STERILE (DRAPES) IMPLANT
CUFF TOURN SGL QUICK 34 (TOURNIQUET CUFF) ×2
CUFF TRNQT CYL 34X4.125X (TOURNIQUET CUFF) ×1 IMPLANT
DECANTER SPIKE VIAL GLASS SM (MISCELLANEOUS) IMPLANT
DRAPE EXTREMITY T 121X128X90 (DISPOSABLE) ×2 IMPLANT
DRAPE POUCH INSTRU U-SHP 10X18 (DRAPES) ×2 IMPLANT
DRAPE SHEET LG 3/4 BI-LAMINATE (DRAPES) ×2 IMPLANT
DRAPE U-SHAPE 47X51 STRL (DRAPES) ×2 IMPLANT
DRSG MEPILEX BORDER 4X8 (GAUZE/BANDAGES/DRESSINGS) ×2 IMPLANT
DRSG PAD ABDOMINAL 8X10 ST (GAUZE/BANDAGES/DRESSINGS) ×2 IMPLANT
DURAPREP 26ML APPLICATOR (WOUND CARE) ×4 IMPLANT
ELECT REM PT RETURN 15FT ADLT (MISCELLANEOUS) ×2 IMPLANT
FACESHIELD WRAPAROUND (MASK) ×2 IMPLANT
FACESHIELD WRAPAROUND OR TEAM (MASK) ×1 IMPLANT
GLOVE BIO SURGEON STRL SZ7.5 (GLOVE) ×2 IMPLANT
GLOVE BIOGEL PI IND STRL 8 (GLOVE) ×2 IMPLANT
GLOVE BIOGEL PI INDICATOR 8 (GLOVE) ×2
GLOVE SURG ORTHO 8.0 STRL STRW (GLOVE) ×2 IMPLANT
GOWN STRL REUS W/TWL 2XL LVL3 (GOWN DISPOSABLE) ×2 IMPLANT
GOWN STRL REUS W/TWL LRG LVL3 (GOWN DISPOSABLE) ×2 IMPLANT
HANDPIECE INTERPULSE COAX TIP (DISPOSABLE) ×2
HOLDER FOLEY CATH W/STRAP (MISCELLANEOUS) IMPLANT
HOOD PEEL AWAY FLYTE STAYCOOL (MISCELLANEOUS) ×4 IMPLANT
IMMOBILIZER KNEE 20 (SOFTGOODS) ×2
IMMOBILIZER KNEE 20 THIGH 36 (SOFTGOODS) IMPLANT
IMMOBILIZER KNEE 22 UNIV (SOFTGOODS) ×2 IMPLANT
KIT BASIN OR (CUSTOM PROCEDURE TRAY) ×2 IMPLANT
KIT TURNOVER KIT A (KITS) IMPLANT
NDL SAFETY ECLIPSE 18X1.5 (NEEDLE) ×1 IMPLANT
NEEDLE HYPO 18GX1.5 SHARP (NEEDLE) ×2
NS IRRIG 1000ML POUR BTL (IV SOLUTION) ×2 IMPLANT
PACK BLADE SAW RECIP 70 3 PT (BLADE) ×1 IMPLANT
PACK ICE MAXI GEL EZY WRAP (MISCELLANEOUS) ×2 IMPLANT
PACK TOTAL JOINT (CUSTOM PROCEDURE TRAY) ×2 IMPLANT
PEG TWIN FEM CEMENTED MED (Knees) ×1 IMPLANT
PROTECTOR NERVE ULNAR (MISCELLANEOUS) ×2 IMPLANT
SET HNDPC FAN SPRY TIP SCT (DISPOSABLE) ×1 IMPLANT
SUCTION FRAZIER HANDLE 12FR (TUBING) ×1
SUCTION TUBE FRAZIER 12FR DISP (TUBING) ×1 IMPLANT
SUT ETHIBOND NAB CT1 #1 30IN (SUTURE) ×2 IMPLANT
SUT VIC AB 0 CT1 36 (SUTURE) ×2 IMPLANT
SUT VIC AB 2-0 CT1 27 (SUTURE) ×2
SUT VIC AB 2-0 CT1 TAPERPNT 27 (SUTURE) ×1 IMPLANT
SUT VIC AB 3-0 SH 8-18 (SUTURE) ×2 IMPLANT
SYR 20ML LL LF (SYRINGE) ×2 IMPLANT
SYR 30ML LL (SYRINGE) ×2 IMPLANT
SYR 3ML LL SCALE MARK (SYRINGE) ×2 IMPLANT
TAPE STRIPS DRAPE STRL (GAUZE/BANDAGES/DRESSINGS) ×1 IMPLANT
TIBIAL OXFORD MEDIAL RT (Orthopedic Implant) ×2 IMPLANT
TOWEL OR 17X26 10 PK STRL BLUE (TOWEL DISPOSABLE) ×2 IMPLANT
TOWEL OR NON WOVEN STRL DISP B (DISPOSABLE) ×2 IMPLANT
TRAY FOLEY MTR SLVR 16FR STAT (SET/KITS/TRAYS/PACK) ×2 IMPLANT
WATER STERILE IRR 1000ML POUR (IV SOLUTION) ×2 IMPLANT

## 2019-02-07 NOTE — Progress Notes (Signed)
CPAP s/u for h/s, RT to recheck on rounds.

## 2019-02-07 NOTE — Transfer of Care (Signed)
Immediate Anesthesia Transfer of Care Note  Patient: Jillian [name]  Procedure(s) Performed: UNICOMPARTMENTAL KNEE (Right Knee)  Patient Location: PACU  Anesthesia Type:Spinal  Level of Consciousness: awake, alert  and oriented  Airway & Oxygen Therapy: Patient Spontanous Breathing and Patient connected to face mask oxygen  Post-op Assessment: Report given to RN and Post -op Vital signs reviewed and stable  Post vital signs: Reviewed and stable  Last Vitals:  Vitals Value Taken Time  BP 98/67 02/07/19 1223  Temp    Pulse 84 02/07/19 1225  Resp 14 02/07/19 1225  SpO2 98 % 02/07/19 1225  Vitals shown include unvalidated device data.  Last Pain:  Vitals:   02/07/19 0820  TempSrc: Oral         Complications: No apparent anesthesia complications

## 2019-02-07 NOTE — H&P (Signed)
PREOPERATIVE H&P  Chief Complaint: Right knee pain  HPI: Jillian [name] is a 63 y.o. female who presents for preoperative history and physical with a diagnosis of right knee osteoarthritis, medial. Symptoms are rated as moderate to severe, and have been worsening.  This is significantly impairing activities of daily living.  She has elected for surgical management.   She has failed injections, activity modification, anti-inflammatories, and assistive devices.  Preoperative X-rays demonstrate end stage degenerative changes with osteophyte formation, loss of joint space, subchondral sclerosis.  We have rescheduled her surgery twice already, once because she presented after having had a facial infection, and was previously hospitalized on IV antibiotics.  Then the second time she presented to preoperative holding she had just had a scabies attack and had multiple sores around her knee.  She was canceled, until skin had normalized and all of her scabies wounds had been epithelialized and she no longer had scabies.  She has now been optimized and is planning to proceed with surgical intervention.  Past Medical History:  Diagnosis Date  . Cancer ([place])    basal cell 2016  . Fibromyalgia   . Pre-diabetes   . Sleep apnea    uses CPAP   Past Surgical History:  Procedure Laterality Date  . CESAREAN SECTION     times 2  . HERNIA REPAIR     Social History   Socioeconomic History  . Marital status: Married    Spouse name: Not on file  . Number of children: 2  . Years of education: Not on file  . Highest education level: Not on file  Occupational History  . Occupation: Therapist, sports  Social Needs  . Financial resource strain: Not on file  . Food insecurity    Worry: Not on file    Inability: Not on file  . Transportation needs    Medical: Not on file    Non-medical: Not on file  Tobacco Use  . Smoking status: Former Smoker    Packs/day: 1.00    Years: 15.00    Pack years: 15.00    Types:  Cigarettes    Quit date: 03/30/1992    Years since quitting: 26.8  . Smokeless tobacco: Never Used  Substance and Sexual Activity  . Alcohol use: No  . Drug use: No  . Sexual activity: Not on file  Lifestyle  . Physical activity    Days per week: Not on file    Minutes per session: Not on file  . Stress: Not on file  Relationships  . Social Herbalist on phone: Not on file    Gets together: Not on file    Attends religious service: Not on file    Active member of club or organization: Not on file    Attends meetings of clubs or organizations: Not on file    Relationship status: Not on file  Other Topics Concern  . Not on file  Social History Narrative   Regular exercise-yes, but seasonal walks and swims   Diet: healthy, low carbohydrates, no caffeine   Family History  Problem Relation Age of Onset  . Cancer Father        colon  . Hyperlipidemia Father   . Hypertension Father   . Diabetes Sister   . Hypertension Sister   . Cancer Paternal Aunt        colon  . Cancer Maternal Grandmother        colon  . Hypertension Maternal Grandmother   .  Anuerysm Paternal Grandfather    No Known Allergies Prior to Admission medications   Medication Sig Start Date End Date Taking? Authorizing Provider  buPROPion (WELLBUTRIN XL) 150 MG 24 hr tablet TAKE 1 TABLET(150 MG) BY MOUTH DAILY 01/11/19  Yes Bedsole, Amy E, MD  cholecalciferol (VITAMIN D) 400 units TABS tablet Take 400 Units by mouth 2 (two) times daily.   Yes [provider]  Coenzyme Q10 (CO Q 10) 100 MG CAPS Take 100 mg by mouth daily.    Yes [provider]  DULoxetine (CYMBALTA) 30 MG capsule Take 3 capsules (90 mg total) by mouth daily. 01/19/19  Yes Bedsole, Amy E, MD  fish oil-omega-3 fatty acids 1000 MG capsule Take 3,000 mg by mouth daily.    Yes [provider]  Magnesium Malate 1250 (141.7 Mg) MG TABS Take 141.7-283.4 mg by mouth See admin instructions. Take 141.7mg  by mouth every  morning and take 283.4mg  every evening   Yes [provider]  pregabalin (LYRICA) 150 MG capsule Take 1 capsule (150 mg total) by mouth 3 (three) times daily. 12/16/18  Yes Bedsole, Amy E, MD  traMADol (ULTRAM) 50 MG tablet TAKE 2 TABLETS BY MOUTH THREE TIMES DAILY 02/02/19  Yes Bedsole, Amy E, MD  methocarbamol (ROBAXIN) 500 MG tablet Take 1 tablet (500 mg total) by mouth 3 (three) times daily as needed for muscle spasms. 12/16/18   Bedsole, Amy E, MD     Positive ROS: All other systems have been reviewed and were otherwise negative with the exception of those mentioned in the HPI and as above.  Physical Exam: General: Alert, no acute distress Cardiovascular: No pedal edema Respiratory: No cyanosis, no use of accessory musculature GI: No organomegaly, abdomen is soft and non-tender Skin: No lesions in the area of chief complaint Neurologic: Sensation intact distally Psychiatric: Patient is competent for consent with normal mood and affect Lymphatic: No axillary or cervical lymphadenopathy  MUSCULOSKELETAL: Right knee has varus alignment with medial crepitance, difficulty with range of motion, painful arc, [place] intact.  Assessment: Right anteromedial knee osteoarthritis   Plan: Plan for Procedure(s): UNICOMPARTMENTAL KNEE  The risks benefits and alternatives were discussed with the patient including but not limited to the risks of nonoperative treatment, versus surgical intervention including infection, bleeding, nerve injury,  blood clots, cardiopulmonary complications, morbidity, mortality, among others, and they were willing to proceed.    Patient's anticipated LOS is less than 2 midnights, meeting these requirements: - Younger than 38 - Lives within 1 hour of care - Has a competent adult at home to recover with post-op recover - NO history of  - Chronic pain requiring opiods  - Diabetes  - Coronary Artery Disease  - Heart failure  - Heart attack  - Stroke  -  DVT/VTE  - Cardiac arrhythmia  - Respiratory Failure/COPD  - Renal failure  - Anemia  - Advanced Liver disease        Johnny Bridge, MD Cell [phone]   02/07/2019 9:12 AM

## 2019-02-07 NOTE — Op Note (Signed)
01/24/2019 - 02/07/2019  11:46 AM  PATIENT:  Jillian [name]    PRE-OPERATIVE DIAGNOSIS:  Right knee anteromedial osteoarthritis  POST-OPERATIVE DIAGNOSIS:  Same  PROCEDURE:  RIGHT Unicompartmental Knee Arthroplasty  SURGEON:  Johnny Bridge, MD  PHYSICIAN ASSISTANT: Joya Gaskins, OPA-C, present and scrubbed throughout the case, critical for completion in a timely fashion, and for retraction, instrumentation, and closure.  ANESTHESIA:   spinal  ESTIMATED BLOOD LOSS: 75 ml  UNIQUE ASPECTS OF THE CASE: The rest of the knee was pristine.  The medial side had extensive grade 3 and grade 4 changes.  Very mild osteophyte formation.  I reamed with a 4 and then a 5 to achieve symmetric gaps.  Her skin was much improved, but I still used an [place] given her history of relatively recent scabies.  PREOPERATIVE INDICATIONS:  Jillian [name] is a  63 y.o. female with a diagnosis of djd right knee who failed conservative measures and elected for surgical management.    The risks benefits and alternatives were discussed with the patient preoperatively including but not limited to the risks of infection, bleeding, nerve injury, cardiopulmonary complications, blood clots, the need for revision surgery, among others, and the patient was willing to proceed.  OPERATIVE IMPLANTS: Biomet [place] mobile bearing medial compartment arthroplasty femur size medium, tibia size B, bearing size 4.  OPERATIVE FINDINGS: Endstage grade 3 and 4 medial compartment osteoarthritis. No significant changes in the lateral or patellofemoral joint.  The ACL was intact.  OPERATIVE PROCEDURE: The patient was brought to the operating room placed in the supine position. General anesthesia was administered. IV antibiotics were given. The lower extremity was placed in the legholder and prepped and draped in usual sterile fashion.  Time out was performed.  The leg was elevated and exsanguinated and the tourniquet was inflated.  Anteromedial incision was performed, and I took care to preserve the MCL. Parapatellar incision was carried out, and the osteophytes were excised, along with the medial meniscus and a small portion of the fat pad.  The extra medullary tibial cutting jig was applied, using the spoon and the 69mm G-Clamp and the 2 mm shim, and I took care to protect the anterior cruciate ligament insertion and the tibial spine. The medial collateral ligament was also protected, and I resected my proximal tibia, matching the anatomic slope.   The proximal tibial bony cut was removed in one piece, and I turned my attention to the femur.  The intramedullary femoral rod was placed using the drill, and then using the appropriate reference, I assembled the femoral jig, setting my posterior cutting block. I resected my posterior femur, used the 0 spigot for the anterior femur, and then measured my gap.   I then used the appropriate mill to match the extension gap to the flexion gap. The second milling was at a 4 and then the 5.  The gaps were then measured again with the appropriate feeler gauges. Once I had balanced flexion and extension gaps, I then completed the preparation of the femur.  I milled off the anterior aspect of the distal femur to prevent impingement. I also exposed the tibia, and selected the above-named component, and then used the cutting jig to prepare the keel slot on the tibia. I also used the awl to curette out the bone to complete the preparation of the keel. The back wall was intact.  I then placed trial components, and it was found to have excellent motion, and appropriate balance.  I then cemented the components into place, cementing the tibia first, removing all excess cement, and then cementing the femur.  All loose cement was removed.  The real polyethylene insert was applied manually, and the knee was taken through functional range of motion, and found to have excellent stability and restoration  of joint motion, with excellent balance.  The wounds were irrigated copiously, and the parapatellar tissue closed with Vicryl, followed by Vicryl for the subcutaneous tissue, with routine closure with Steri-Strips and sterile gauze.  The tourniquet was released, and the patient was awakened and extubated and returned to PACU in stable and satisfactory condition. There were no complications.

## 2019-02-07 NOTE — Discharge Instructions (Signed)

## 2019-02-07 NOTE — Progress Notes (Signed)
Pt. able to place on independently, remains on room air, aware to notify if needed. 

## 2019-02-07 NOTE — Anesthesia Procedure Notes (Signed)
Spinal  Start time: 02/07/2019 10:17 AM End time: 02/07/2019 10:23 AM Staffing Resident/CRNA: Gean Maidens, CRNA Performed: resident/CRNA  Preanesthetic Checklist Completed: patient identified, site marked, surgical consent, pre-op evaluation, timeout performed, IV checked, risks and benefits discussed and monitors and equipment checked Spinal Block Patient position: sitting Prep: DuraPrep Patient monitoring: heart rate, continuous pulse ox and blood pressure Approach: midline Location: L4-5 Injection technique: single-shot Needle Needle type: Pencan  Needle length: 9 cm Needle insertion depth: 7 cm Additional Notes Pt sitting position, sterile prep and drape, negative paresthesia/heme

## 2019-02-07 NOTE — Plan of Care (Signed)
Plan of care reviewed and discussed with the patient. 

## 2019-02-07 NOTE — Evaluation (Signed)
Physical Therapy Evaluation Patient Details Name: Jillian [name] MRN: [id number] DOB: 1955-09-05 Today's Date: 02/07/2019   History of Present Illness  R UKA  Clinical Impression  Pt admitted with above diagnosis. Pt ambulated 75' with RW, no loss of balance. Initiated UKA HEP. Good progress expected.  Pt currently with functional limitations due to the deficits listed below (see PT Problem List). Pt will benefit from skilled PT to increase their independence and safety with mobility to allow discharge to the venue listed below.       Follow Up Recommendations Follow surgeon's recommendation for DC plan and follow-up therapies    Equipment Recommendations  Cane    Recommendations for Other Services       Precautions / Restrictions Precautions Precautions: Knee Precaution Comments: reviewed no pillow under knee Restrictions Weight Bearing Restrictions: No RLE Weight Bearing: Weight bearing as tolerated Other Position/Activity Restrictions: WBAT      Mobility  Bed Mobility Overal bed mobility: Modified Independent             General bed mobility comments: HOB up  Transfers Overall transfer level: Needs assistance Equipment used: Rolling walker (2 wheeled) Transfers: Sit to/from Stand Sit to Stand: Min guard         General transfer comment: VCs hand placement  Ambulation/Gait Ambulation/Gait assistance: Supervision Gait Distance (Feet): 75 Feet Assistive device: Rolling walker (2 wheeled) Gait Pattern/deviations: Step-through pattern;Decreased stride length;Decreased weight shift to right Gait velocity: WFL   General Gait Details: steady, no loss of balance  Stairs            Wheelchair Mobility    Modified Rankin (Stroke Patients Only)       Balance Overall balance assessment: Modified Independent                                           Pertinent Vitals/Pain Pain Assessment: No/denies pain    Home Living  Family/patient expects to be discharged to:: Private residence Living Arrangements: Spouse/significant other     Home Access: Stairs to enter Entrance Stairs-Rails: None Entrance Stairs-Number of Steps: 1 Home Layout: Two level;Bed/bath upstairs Home Equipment: Walker - 2 wheels;Bedside commode;Crutches      Prior Function Level of Independence: Independent               Hand Dominance        Extremity/Trunk Assessment   Upper Extremity Assessment Upper Extremity Assessment: Overall WFL for tasks assessed    Lower Extremity Assessment Lower Extremity Assessment: RLE deficits/detail RLE Deficits / Details: 0-45* RLE Sensation: WNL RLE Coordination: WNL    Cervical / Trunk Assessment Cervical / Trunk Assessment: Normal  Communication   Communication: No difficulties  Cognition Arousal/Alertness: Awake/alert Behavior During Therapy: WFL for tasks assessed/performed Overall Cognitive Status: Within Functional Limits for tasks assessed                                        General Comments      Exercises Total Joint Exercises Ankle Circles/Pumps: AROM;Both;10 reps;Supine Quad Sets: AROM;Right;5 reps;Supine Heel Slides: AAROM;Right;10 reps;Supine Long Arc Quad: AROM;Right;5 reps;Seated Goniometric ROM: 0-45* AAROM R knee   Assessment/Plan    PT Assessment Patient needs continued PT services  PT Problem List Decreased strength;Decreased range of motion;Decreased activity tolerance;Decreased mobility;Pain  PT Treatment Interventions DME instruction;Gait training;Functional mobility training;Stair training;Therapeutic exercise;Therapeutic activities;Patient/family education    PT Goals (Current goals can be found in the Care Plan section)  Acute Rehab PT Goals 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, return to work as Therapist, sports PT Goal Formulation: With patient Time For Goal Achievement: 02/14/19 Potential to Achieve Goals: Good    Frequency  7X/week   Barriers to discharge        Co-evaluation               AM-PAC PT "6 Clicks" Mobility  Outcome Measure Help needed turning from your back to your side while in a flat bed without using bedrails?: A Little Help needed moving from lying on your back to sitting on the side of a flat bed without using bedrails?: A Little Help needed moving to and from a bed to a chair (including a wheelchair)?: A Little Help needed standing up from a chair using your arms (e.g., wheelchair or bedside chair)?: A Little Help needed to walk in hospital room?: A Little Help needed climbing 3-5 steps with a railing? : A Lot 6 Click Score: 17    End of Session Equipment Utilized During Treatment: Gait belt Activity Tolerance: Patient tolerated treatment well Patient left: in chair;with call bell/phone within reach;with chair alarm set Nurse Communication: Mobility status PT Visit Diagnosis: Muscle weakness (generalized) (M62.81);Difficulty in walking, not elsewhere classified (R26.2);Pain Pain - Right/Left: Right Pain - part of body: Knee    Time: [id number] PT Time Calculation (min) (ACUTE ONLY): 24 min   Charges:   PT Evaluation $PT Eval Low Complexity: 1 Low PT Treatments $Gait Training: 8-22 mins        Blondell Reveal Kistler PT 02/07/2019  Acute Rehabilitation Services Pager [phone] Office [phone]

## 2019-02-07 NOTE — Anesthesia Procedure Notes (Signed)
Anesthesia Regional Block: Adductor canal block   Pre-Anesthetic Checklist: ,, timeout performed, Correct Patient, Correct Site, Correct Laterality, Correct Procedure, Correct Position, site marked, Risks and benefits discussed,  Surgical consent,  Pre-op evaluation,  At surgeon's request and post-op pain management  Laterality: Lower and Right  Prep: chloraprep       Needles:  Injection technique: Single-shot  Needle Type: Echogenic Stimulator Needle     Needle Length: 10cm  Needle Gauge: 21   Needle insertion depth: 3 cm   Additional Needles:   Procedures:,,,, ultrasound used (permanent image in chart),,,,  Narrative:  Start time: 02/07/2019 9:07 AM End time: 02/07/2019 9:15 AM Injection made incrementally with aspirations every 5 mL.  Performed by: Personally  Anesthesiologist: Lyn Hollingshead, MD

## 2019-02-07 NOTE — Progress Notes (Signed)
Assisted Dr. Hatchett with right, ultrasound guided, adductor canal block. Side rails up, monitors on throughout procedure. See vital signs in flow sheet. Tolerated Procedure well.  

## 2019-02-07 NOTE — Anesthesia Preprocedure Evaluation (Signed)
Anesthesia Evaluation  Patient identified by MRN, date of birth, ID band Patient awake    Reviewed: Allergy & Precautions, H&P , NPO status , Patient's Chart, lab work & pertinent test results  Airway Mallampati: I  TM Distance: >3 FB Neck ROM: full    Dental no notable dental hx. (+) Teeth Intact   Pulmonary former smoker,    Pulmonary exam normal breath sounds clear to auscultation       Cardiovascular negative cardio ROS Normal cardiovascular exam Rhythm:regular Rate:Normal     Neuro/Psych PSYCHIATRIC DISORDERS Anxiety    GI/Hepatic negative GI ROS, Neg liver ROS,   Endo/Other  negative endocrine ROS  Renal/GU negative Renal ROS  negative genitourinary   Musculoskeletal  (+) Fibromyalgia -  Abdominal (+) + obese,   Peds  Hematology negative hematology ROS (+)   Anesthesia Other Findings   Reproductive/Obstetrics negative OB ROS                             Anesthesia Physical Anesthesia Plan  ASA: II  Anesthesia Plan: Spinal   Post-op Pain Management:  Regional for Post-op pain   Induction:   PONV Risk Score and Plan: 2 and Ondansetron, Dexamethasone and Midazolam  Airway Management Planned: Nasal Cannula, Natural Airway and Simple Face Mask  Additional Equipment: None  Intra-op Plan:   Post-operative Plan:   Informed Consent: I have reviewed the patients History and Physical, chart, labs and discussed the procedure including the risks, benefits and alternatives for the proposed anesthesia with the patient or authorized representative who has indicated his/her understanding and acceptance.       Plan Discussed with: CRNA  Anesthesia Plan Comments:         Anesthesia Quick Evaluation

## 2019-02-07 NOTE — Anesthesia Postprocedure Evaluation (Signed)
Anesthesia Post Note  Patient: Jillian [name]  Procedure(s) Performed: UNICOMPARTMENTAL KNEE (Right Knee)     Patient location during evaluation: PACU Anesthesia Type: Spinal Level of consciousness: awake Pain management: pain level controlled Vital Signs Assessment: post-procedure vital signs reviewed and stable Respiratory status: spontaneous breathing Cardiovascular status: stable Postop Assessment: no headache, no backache, spinal receding, patient able to bend at knees and no apparent nausea or vomiting Anesthetic complications: no    Last Vitals:  Vitals:   02/07/19 1300 02/07/19 1315  BP: 103/72 116/68  Pulse: 79 77  Resp: 15 16  Temp:  36.6 C  SpO2: 98% 97%    Last Pain:  Vitals:   02/07/19 1315  TempSrc:   PainSc: 0-No pain   Pain Goal:    LLE Motor Response: Purposeful movement (02/07/19 1300)   RLE Motor Response: Purposeful movement (02/07/19 1300)   L Sensory Level: L3-Anterior knee, lower leg (02/07/19 1300) R Sensory Level: L3-Anterior knee, lower leg (02/07/19 1300) Epidural/Spinal Function Patient able to flex knees: Yes (02/07/19 1300), Patient able to lift hips off bed: No (02/07/19 1300), Back pain beyond tenderness at insertion site: No (02/07/19 1300), Progressively worsening motor and/or sensory loss: No (02/07/19 1300), Bowel and/or bladder incontinence post epidural: No (02/07/19 1300)  Huston Foley

## 2019-02-08 DIAGNOSIS — M1711 Unilateral primary osteoarthritis, right knee: Secondary | ICD-10-CM | POA: Diagnosis not present

## 2019-02-08 LAB — CBC
HCT: 35 % — ABNORMAL LOW (ref 36.0–46.0)
Hemoglobin: 11.5 g/dL — ABNORMAL LOW (ref 12.0–15.0)
MCH: 31.1 pg (ref 26.0–34.0)
MCHC: 32.9 g/dL (ref 30.0–36.0)
MCV: 94.6 fL (ref 80.0–100.0)
Platelets: 189 10*3/uL (ref 150–400)
RBC: 3.7 MIL/uL — ABNORMAL LOW (ref 3.87–5.11)
RDW: 12.1 % (ref 11.5–15.5)
WBC: 11.8 10*3/uL — ABNORMAL HIGH (ref 4.0–10.5)
nRBC: 0 % (ref 0.0–0.2)

## 2019-02-08 LAB — BASIC METABOLIC PANEL
Anion gap: 8 (ref 5–15)
BUN: 13 mg/dL (ref 8–23)
CO2: 20 mmol/L — ABNORMAL LOW (ref 22–32)
Calcium: 8.8 mg/dL — ABNORMAL LOW (ref 8.9–10.3)
Chloride: 105 mmol/L (ref 98–111)
Creatinine, Ser: 0.78 mg/dL (ref 0.44–1.00)
GFR calc Af Amer: >60 mL/min (ref >60)
GFR calc non Af Amer: >60 mL/min (ref >60)
Glucose, Bld: 176 mg/dL — ABNORMAL HIGH (ref 70–99)
Potassium: 4.7 mmol/L (ref 3.5–5.1)
Sodium: 133 mmol/L — ABNORMAL LOW (ref 135–145)

## 2019-02-08 NOTE — Progress Notes (Signed)
Patient discharged to home w/ family. Given all belongings, instructions, equipment. Verbalized understanding of instructions. Escorted to pov via w/c. 

## 2019-02-08 NOTE — Discharge Summary (Signed)
Physician Discharge Summary  Patient ID: Jillian [name] MRN: [id number] DOB/AGE: 64-13-1957 64 y.o.  Admit date: 02/07/2019 Discharge date: 02/08/2019  Admission Diagnoses:  Primary localized osteoarthritis of right knee  Discharge Diagnoses:  Principal Problem:   Primary localized osteoarthritis of right knee Active Problems:   Status post right partial knee replacement   Past Medical History:  Diagnosis Date  . Cancer ([place])    basal cell 2016  . Fibromyalgia   . Pre-diabetes   . Primary localized osteoarthritis of right knee 02/07/2019  . Sleep apnea    uses CPAP    Surgeries: Procedure(s): UNICOMPARTMENTAL KNEE on 02/07/2019   Consultants (if any):   Discharged Condition: Improved  Hospital Course: Jillian [name] is an 63 y.o. female who was admitted 02/07/2019 with a diagnosis of Primary localized osteoarthritis of right knee and went to the operating room on 02/07/2019 and underwent the above named procedures.    She was given perioperative antibiotics:  Anti-infectives (From admission, onward)   Start     Dose/Rate Route Frequency Ordered Stop   02/07/19 1630  ceFAZolin (ANCEF) IVPB 2g/100 mL premix     2 g 200 mL/hr over 30 Minutes Intravenous Every 6 hours 02/07/19 1536 02/07/19 2245   02/07/19 0815  ceFAZolin (ANCEF) IVPB 2g/100 mL premix     2 g 200 mL/hr over 30 Minutes Intravenous On call to O.R. 02/07/19 0807 02/07/19 1105   01/24/19 1045  ceFAZolin (ANCEF) IVPB 2g/100 mL premix     2 g 200 mL/hr over 30 Minutes Intravenous On call to O.R. 01/24/19 1038 01/25/19 0559    .  She was given sequential compression devices, early ambulation, and aspirin for DVT prophylaxis.  She benefited maximally from the hospital stay and there were no complications.    Recent vital signs:  Vitals:   02/08/19 0130 02/08/19 0607  BP: 96/61 119/73  Pulse: 77 79  Resp: 19 19  Temp: 98.6 F (37 C) 98 F (36.7 C)  SpO2: 97% 100%    Recent laboratory studies:   Lab Results  Component Value Date   HGB 11.5 (L) 02/08/2019   HGB 12.5 01/30/2019   HGB 12.8 01/13/2019   Lab Results  Component Value Date   WBC 11.8 (H) 02/08/2019   PLT 189 02/08/2019   No results found for: INR Lab Results  Component Value Date   NA 133 (L) 02/08/2019   K 4.7 02/08/2019   CL 105 02/08/2019   CO2 20 (L) 02/08/2019   BUN 13 02/08/2019   CREATININE 0.78 02/08/2019   GLUCOSE 176 (H) 02/08/2019    Discharge Medications:   Allergies as of 02/08/2019   No Known Allergies     Medication List    TAKE these medications   aspirin EC 325 MG tablet Take 1 tablet (325 mg total) by mouth 2 (two) times daily.   buPROPion 150 MG 24 hr tablet Commonly known as: WELLBUTRIN XL TAKE 1 TABLET(150 MG) BY MOUTH DAILY   cholecalciferol 10 MCG (400 UNIT) Tabs tablet Commonly known as: VITAMIN D3 Take 400 Units by mouth 2 (two) times daily.   Co Q 10 100 MG Caps Take 100 mg by mouth daily.   DULoxetine 30 MG capsule Commonly known as: CYMBALTA Take 3 capsules (90 mg total) by mouth daily.   fish oil-omega-3 fatty acids 1000 MG capsule Take 3,000 mg by mouth daily.   Magnesium Malate 1250 (141.7 Mg) MG Tabs Take 141.7-283.4 mg by mouth See admin  instructions. Take 141.7mg  by mouth every morning and take 283.4mg  every evening   methocarbamol 500 MG tablet Commonly known as: ROBAXIN Take 1 tablet (500 mg total) by mouth 3 (three) times daily as needed for muscle spasms.   oxyCODONE 5 MG immediate release tablet Commonly known as: Roxicodone Take 1 tablet (5 mg total) by mouth every 4 (four) hours as needed for severe pain.   pregabalin 150 MG capsule Commonly known as: LYRICA Take 1 capsule (150 mg total) by mouth 3 (three) times daily.   sennosides-docusate sodium 8.6-50 MG tablet Commonly known as: SENOKOT-S Take 2 tablets by mouth daily.   traMADol 50 MG tablet Commonly known as: ULTRAM TAKE 2 TABLETS BY MOUTH THREE TIMES DAILY What changed:  additional instructions       Diagnostic Studies: Dg Knee Right Port  Result Date: 02/07/2019 CLINICAL DATA:  Partial knee replacement. EXAM: PORTABLE RIGHT KNEE - 1-2 VIEW COMPARISON:  MRI 11/28/2018. FINDINGS: Medial hemiarthroplasty. Hardware intact. Anatomic alignment. No acute bony abnormality. IMPRESSION: Hemiarthroplasty medial aspect right knee.  Anatomic alignment. Electronically Signed   By: Fajardo   On: 02/07/2019 13:06    Disposition:     Follow-up Information    Marchia Bond, MD. Schedule an appointment as soon as possible for a visit in 2 weeks.   Specialty: Orthopedic Surgery Contact information: [address] [place] [address] [phone]            Signed: Johnny Bridge 02/08/2019, 7:40 AM

## 2019-02-08 NOTE — Progress Notes (Signed)
Physical Therapy Treatment Patient Details Name: Jillian [name] MRN: [id number] DOB: 1955-12-21 Today's Date: 02/08/2019    History of Present Illness R UKA    PT Comments    Pt is progressing well with mobility and is ready to DC home from PT standpoint. She ambulated 34' with RW, completed stair training, and demonstrates good understanding of HEP.   Follow Up Recommendations  Follow surgeon's recommendation for DC plan and follow-up therapies     Equipment Recommendations  Cane    Recommendations for Other Services       Precautions / Restrictions Precautions Precautions: Knee Precaution Booklet Issued: Yes (comment) Precaution Comments: reviewed no pillow under knee Restrictions Weight Bearing Restrictions: No RLE Weight Bearing: Weight bearing as tolerated    Mobility  Bed Mobility               General bed mobility comments: up in recliner  Transfers Overall transfer level: Needs assistance Equipment used: Rolling walker (2 wheeled) Transfers: Sit to/from Stand Sit to Stand: Supervision         General transfer comment: VCs hand placement  Ambulation/Gait Ambulation/Gait assistance: Supervision Gait Distance (Feet): 280 Feet Assistive device: Rolling walker (2 wheeled) Gait Pattern/deviations: Step-through pattern;Decreased stride length;Decreased weight shift to right Gait velocity: WFL   General Gait Details: steady, no loss of balance   Stairs Stairs: Yes   Stair Management: One rail Right;Forwards;With cane Number of Stairs: 3 General stair comments: 3 stairs 1 rail on R with SPC, then 1 step backwards with RW and no rail; VCs sequencing   Wheelchair Mobility    Modified Rankin (Stroke Patients Only)       Balance Overall balance assessment: Modified Independent                                          Cognition Arousal/Alertness: Awake/alert Behavior During Therapy: WFL for tasks  assessed/performed Overall Cognitive Status: Within Functional Limits for tasks assessed                                        Exercises Total Joint Exercises Ankle Circles/Pumps: AROM;Both;10 reps;Supine Quad Sets: AROM;Right;5 reps;Supine Short Arc Quad: AROM;Right;10 reps;Supine Heel Slides: AAROM;Right;10 reps;Supine Hip ABduction/ADduction: AROM;Right;10 reps;Supine Straight Leg Raises: AROM;Right;10 reps;Supine Long Arc Quad: AROM;Right;Seated;10 reps Knee Flexion: AAROM;Right;5 reps;Seated Goniometric ROM: 0-100* R knee AAROM    General Comments        Pertinent Vitals/Pain Pain Assessment: 0-10 Pain Score: 5  Pain Location: R knee Pain Descriptors / Indicators: Sore Pain Intervention(s): Limited activity within patient's tolerance;Monitored during session;Premedicated before session;Ice applied    Home Living                      Prior Function            PT Goals (current goals can now be found in the care plan section) Acute Rehab PT Goals 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, return to work as Therapist, sports PT Goal Formulation: With patient Time For Goal Achievement: 02/14/19 Potential to Achieve Goals: Good Progress towards PT goals: Goals met/education completed, patient discharged from PT    Frequency    7X/week      PT Plan Current plan remains appropriate    Co-evaluation  AM-PAC PT "6 Clicks" Mobility   Outcome Measure  Help needed turning from your back to your side while in a flat bed without using bedrails?: None Help needed moving from lying on your back to sitting on the side of a flat bed without using bedrails?: None Help needed moving to and from a bed to a chair (including a wheelchair)?: None Help needed standing up from a chair using your arms (e.g., wheelchair or bedside chair)?: None Help needed to walk in hospital room?: None Help needed climbing 3-5 steps with a railing? : A Little 6 Click Score:  23    End of Session Equipment Utilized During Treatment: Gait belt Activity Tolerance: Patient tolerated treatment well Patient left: in chair;with call bell/phone within reach;with chair alarm set Nurse Communication: Mobility status PT Visit Diagnosis: Muscle weakness (generalized) (M62.81);Difficulty in walking, not elsewhere classified (R26.2);Pain Pain - Right/Left: Right Pain - part of body: Knee     Time: [id number] PT Time Calculation (min) (ACUTE ONLY): 26 min  Charges:  $Gait Training: 8-22 mins $Therapeutic Exercise: 8-22 mins                    Blondell Reveal Kistler PT 02/08/2019  Acute Rehabilitation Services Pager [phone] Office [phone]

## 2019-02-09 ENCOUNTER — Encounter (HOSPITAL_COMMUNITY): Payer: Self-pay | Admitting: Orthopedic Surgery

## 2019-02-13 ENCOUNTER — Encounter: Payer: Self-pay | Admitting: Physical Therapy

## 2019-02-13 ENCOUNTER — Other Ambulatory Visit: Payer: Self-pay | Admitting: *Deleted

## 2019-02-13 ENCOUNTER — Other Ambulatory Visit: Payer: Self-pay

## 2019-02-13 ENCOUNTER — Ambulatory Visit: Payer: PRIVATE HEALTH INSURANCE | Attending: Orthopedic Surgery | Admitting: Physical Therapy

## 2019-02-13 DIAGNOSIS — M6281 Muscle weakness (generalized): Secondary | ICD-10-CM | POA: Diagnosis present

## 2019-02-13 DIAGNOSIS — R2681 Unsteadiness on feet: Secondary | ICD-10-CM | POA: Diagnosis present

## 2019-02-13 DIAGNOSIS — R262 Difficulty in walking, not elsewhere classified: Secondary | ICD-10-CM | POA: Diagnosis present

## 2019-02-13 DIAGNOSIS — M25561 Pain in right knee: Secondary | ICD-10-CM

## 2019-02-13 NOTE — Telephone Encounter (Signed)
Last office visit 12/23/2018 for hospital follow up neck abscess.  Last refilled 12/16/2018 for #90 with 1 refill.  CPE scheduled for 03/10/2019.

## 2019-02-13 NOTE — Therapy (Signed)
[facility] [address], [place], [address] Phone: [phone]   Fax:  [phone]  Physical Therapy Evaluation  Patient Details  Name: Jillian [name] MRN: [id number] Date of Birth: [birth year] Referring Provider (PT): Johnny Bridge, MD   Encounter Date: 02/13/2019  PT End of Session - 02/13/19 1054    Visit Number  1    Number of Visits  16    Date for PT Re-Evaluation  04/10/19    Authorization Type  MEDCOST reporting from 02/13/2019    Authorization - Visit Number  1    Authorization - Number of Visits  10    PT Start Time  1100    PT Stop Time  1155    PT Time Calculation (min)  55 min    Activity Tolerance  Patient tolerated treatment well    Behavior During Therapy  [facility] for tasks assessed/performed       Past Medical History:  Diagnosis Date  . Cancer ([place])    basal cell 2016  . Fibromyalgia   . Pre-diabetes   . Primary localized osteoarthritis of right knee 02/07/2019  . Sleep apnea    uses CPAP    Past Surgical History:  Procedure Laterality Date  . CESAREAN SECTION     times 2  . HERNIA REPAIR    . PARTIAL KNEE ARTHROPLASTY Right 02/07/2019   Procedure: UNICOMPARTMENTAL KNEE;  Surgeon: Marchia Bond, MD;  Location: [facility];  Service: Orthopedics;  Laterality: Right;    There were no vitals filed for this visit.    Subjective Assessment - 02/13/19 1050    Subjective  Patient reports R knee pain post right knee unicompartmental arthroplasty on 02/07/2019. The pain is currently 4-5/10, and the worse pain can go up to 8/10; lowest pain is 2-3/10. 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 Patient enjoys painting, coloring books, and baking. Patient unable to walk her dog right now due to her knee pain from the surgery.    Pertinent History  Relevant past medical history and comorbidities include skin cancer 2016, fibromyalgia since2005, and  pre-diabetes etc.; surgeries include hernia repair and R unicompartmental knee arthroplasty performed 02/07/2019, etc., (See more details above)    Limitations  Walking;Lifting;Standing;House hold activities    How long can you stand comfortably?  10 mins    How long can you walk comfortably?  10 mins    Diagnostic tests  x-ray: Hemiarthroplasty medial aspect right knee.  Anatomic alignment.    Patient Stated Goals  Return to walk without pain and AD. Patient also want to walk her dogs.    Currently in Pain?  Yes    Pain Score  4     Pain Location  Knee    Pain Orientation  Right    Pain Descriptors / Indicators  Aching;Constant;Grimacing    Pain Type  Surgical pain    Pain Onset  In the past 7 days    Pain Frequency  Constant    Aggravating Factors   prolonged standing/walking, general movements and weight bearing.    Pain Relieving Factors  Resting, pain medication, ice.    Effect of Pain on Daily Activities  ADLs and walking her dogs, standing, lifting         [facility] PT Assessment - 02/13/19 0001      Assessment   Medical Diagnosis   s/p right knee unicopmartmental arthroplasty  Referring Provider (PT)  Johnny Bridge, MD    Onset Date/Surgical Date  02/07/19    Hand Dominance  Right    Next MD Visit  02/20/2019    Prior Therapy  No       Precautions   Precautions  None      Restrictions   Weight Bearing Restrictions  Yes    RLE Weight Bearing  Weight bearing as tolerated      Balance Screen   Has the patient fallen in the past 6 months  No      [place] residence    Living Arrangements  Spouse/significant other;Children;Parent    Available Help at Discharge  Family    Type of [place] to enter    Entrance Stairs-Number of Steps  2    Home Layout  Two level    Alternate Level Stairs-Number of Steps  [place] - 2 wheels;Cane - single  point;Bedside commode      Prior Function   Level of Independence  Independent    Vocation  Full time employment    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  Posture Forward neck posture and increase kyphosis at sitting.   Lumbar/Hip AROM: WFL and painless with overpressure in all planes  Gait R knee valgus and increased BOS during standing phase.   Palpation Tender to palpation along medial and lateral joint line of knee. Redness and swelling observed and scar is clean, dry without odor.   Strength R*/L 4/4+ Hip flexion 4/4+ Hip extension  4+/4+ Hip abduction 4+/4+ Hip adduction 4/5 Knee extension 5/5 Knee flexion 5/5 Ankle Dorsiflexion 5/5 Ankle Plantarflexion *indicates pain  AROM Knee R/L Flexion: 105*/130  Extension: -11*/0 *indicates pain  Hip and ankle WFL bilaterally.    NEUROLOGICAL: Sensation Grossly intact to light touch bilateral LEs as determined by testing dermatomes L2-S2 Proprioception and hot/cold testing deferred on this date  POSTURAL CONTROL TESTS          Feet together - Eyes open: 15s  - Eyes close: 15s mild postural sway   Tandem standing - R LE in front: 5s  - L LE in front: 10s   TREATMENT:  Therapeutic exercise: to centralize symptoms and improve ROM, strength, muscular endurance, and activity tolerance required for successful completion of functional activities.  - supine short arcs 3x10 reps. Cuing to hold 3 s at end of knee extension.  - Seated long arc x 2 mins to improve knee ROM - Seated quad set with hand over pressure at R knee to improve knee  extension.  Patient was educated on diagnosis, anatomy and pathology involved, prognosis, role of PT, and was given an HEP, demonstrating exercise with proper form following verbal and tactile cues, and was given a paper hand out to continue exercise at home. Pt was educated on and agreed to plan of care.  HOME EXERCISE PROGRAM Access  Code: [email or web address]  URL: [email or web address]  Date: 02/13/2019  Prepared by: Rosita Kea   Exercises Supine Knee Extension Strengthening - 3 sets - 10 reps - 3s hold - 2-3x daily - 7x weekly Seated Long Arc Quad - 18mins hold - 2-3x daily - 7x weekly Seated Quad Set - 3 sets - 10 reps - 4 seconds hold - 2-3x daily - 7x weekly Rowing     PT Education - 02/13/19 1054    Education Details  Exercise purpose/form. Self management techniques. Education on diagnosis, prognosis, POC, anatomy and physiology of current condition Education on HEP including handou    Person(s) Educated  Patient    Methods  Explanation;Demonstration;Tactile cues;Verbal cues;Handout    Comprehension  Verbalized understanding;Returned demonstration;Verbal cues required;Tactile cues required       PT Short Term Goals - 02/13/19 1735      PT SHORT TERM GOAL #1   Title  Be independent with initial home exercise program for self-management of symptoms.    Baseline  initial HEP provided at IE (02/13/2019);    Time  2    Period  Weeks    Status  New    Target Date  02/27/19        PT Long Term Goals - 02/13/19 1736      PT LONG TERM GOAL #1   Title  Be independent with a long-term home exercise program for self-management of symptoms.    Baseline  initial HEP provided at IE (02/13/2019)    Time  8    Period  Weeks    Status  New    Target Date  04/10/19      PT LONG TERM GOAL #2   Title  Demonstrate improved FOTO score by 10 units to demonstrate improvement in overall condition and self-reported functional ability.    Baseline  FOTO = 46 (02/13/2019)    Time  8     Period  Weeks    Status  New    Target Date  04/10/19      PT LONG TERM GOAL #3   Title  Reduce pain with functional activities to equal or less than 1/10 to allow patient to complete usual activities including ADLs, IADLs, and social engagement with less difficulty.    Baseline  2/10 min; 8/10 max (02/13/2019)    Time  8    Period  Weeks    Status  New    Target Date  04/10/19      PT LONG TERM GOAL #4   Title  Complete community, work and/or recreational activities without limitation due to current condition.    Baseline  ADLs and walking her dogs, standing, lifting, weight bearing, and various movements(02/13/2019)    Time  8    Period  Weeks    Status  New    Target Date  04/10/19      PT LONG TERM GOAL #5   Title  Demonstrated R knee extension ROM to neutral and flexion ROM to 130 degrees with less or equal to 1/10 pain at end range to increase functional mobility and reduce fall risk.    Baseline  Flexion: 105 degrees; extension: 11 degrees to neutral  (02/13/2019)    Time  8    Period  Weeks    Status  New    Target Date  04/10/19             Plan - 02/13/19 1055    Clinical Impression Statement  Patient  is a 63 y.o. female who presents to outpatient physical therapy with a referral for medical diagnosis of R unicompartmental knee arthroplasty performed 02/07/2019. This patient presents with the sign and symptoms consistent with R knee pain reduced balance, stiffness, with functional activities/recreational activities. Upon assessment, pt demonstrated deficits such as deficits in strength, mobility, weakness, significant pain with mobility, and limited R knee ROM. These deficits limit the patient ability to perform things such as ADLs, IADLs, social participation, caring for others, engaging in hobbies (walking with her dogs), and impairs their quality of life. The pt will benefit from skilled PT services to address deficits and return to PLOF and independence,  recreational activity and work.    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, jewellery  Low    Rehab Potential  Good    PT Frequency  2x / week    PT Duration  8 weeks    PT Treatment/Interventions  ADLs/Self Care Home Management;Cryotherapy;Moist Heat;Stair training;Functional mobility training;Therapeutic activities;Therapeutic exercise;Balance training;Neuromuscular re-education;Manual techniques;Dry needling;Scar mobilization;Passive range of motion;Joint Manipulations;Spinal Manipulations;Electrical Stimulation;Patient/family education    PT Next Visit Plan  ROM, strengthening, stretching    PT Home Exercise Plan  [email or web address]    Consulted and Agree with Plan of Care  Patient       Patient will benefit from skilled therapeutic intervention in order to improve the following deficits and impairments:  Abnormal gait, Decreased balance, Decreased endurance, Decreased mobility, Difficulty walking, Hypomobility, Pain, Impaired flexibility, Decreased strength, Decreased activity tolerance, Decreased range of motion, Impaired perceived functional ability, Decreased skin integrity  Visit Diagnosis: Acute pain of right knee  Muscle weakness (generalized)  Unsteadiness on feet  Difficulty in walking, not elsewhere classified     Problem List Patient Active Problem List   Diagnosis Date Noted  . Primary  localized osteoarthritis of right knee 02/07/2019  . Status post right partial knee replacement 02/07/2019  . Neck abscess 12/23/2018  . Pre-op evaluation 12/23/2018  . Abscess 12/12/2018  . Vagina itching 07/14/2018  . Ear discharge, bilateral 07/14/2018  . Weight gain 07/14/2018  . Class 1 obesity due to excess calories without serious comorbidity with body mass index (BMI) of 33.0 to 33.9 in adult 05/13/2018  . Prediabetes 12/03/2017  . Snoring 02/27/2016  . Non-restorative sleep 02/27/2016  . Vitamin D deficiency 10/28/2015  . Generalized anxiety disorder 11/25/2012  . HYPERTRIGLYCERIDEMIA 12/20/2008  . MENOPAUSE, EARLY 10/04/2008  . Fibromyalgia 10/04/2008    Sherrilyn Rist, SPT 02/13/19, 6:43 PM  Everlean Alstrom. Graylon Good, PT, DPT 02/13/19, 6:43 PM  [place] [facility] [address] [place], [address] Phone: [phone]   Fax:  [phone]  Name: Jillian [name] MRN: [id number] Date of Birth: October 26, 1955

## 2019-02-15 MED ORDER — PREGABALIN 150 MG PO CAPS: 150.0000 mg | ORAL_CAPSULE | Freq: Three times a day (TID) | ORAL | 1 refills | Status: DC

## 2019-02-16 ENCOUNTER — Ambulatory Visit: Payer: PRIVATE HEALTH INSURANCE | Admitting: Physical Therapy

## 2019-02-16 ENCOUNTER — Encounter: Payer: Self-pay | Admitting: Physical Therapy

## 2019-02-16 ENCOUNTER — Other Ambulatory Visit: Payer: Self-pay

## 2019-02-16 DIAGNOSIS — M25561 Pain in right knee: Secondary | ICD-10-CM | POA: Diagnosis not present

## 2019-02-16 DIAGNOSIS — M6281 Muscle weakness (generalized): Secondary | ICD-10-CM

## 2019-02-16 DIAGNOSIS — R2681 Unsteadiness on feet: Secondary | ICD-10-CM

## 2019-02-16 DIAGNOSIS — R262 Difficulty in walking, not elsewhere classified: Secondary | ICD-10-CM

## 2019-02-16 NOTE — Therapy (Signed)
[place] [facility] [address], [place], [address] Phone: [phone]   Fax:  [phone]  Physical Therapy Treatment  Patient Details  Name: Jillian [name] MRN: [id number] Date of Birth: 12/04/1955 Referring Provider (PT): Johnny Bridge, MD   Encounter Date: 02/16/2019  PT End of Session - 02/16/19 1641    Visit Number  2    Number of Visits  16    Date for PT Re-Evaluation  04/10/19    Authorization Type  MEDCOST reporting from 02/13/2019    Authorization - Visit Number  2    Authorization - Number of Visits  10    PT Start Time  1642    PT Stop Time  1725    PT Time Calculation (min)  43 min    Activity Tolerance  Patient tolerated treatment well    Behavior During Therapy  [facility] for tasks assessed/performed       Past Medical History:  Diagnosis Date  . Cancer ([place])    basal cell 2016  . Fibromyalgia   . Pre-diabetes   . Primary localized osteoarthritis of right knee 02/07/2019  . Sleep apnea    uses CPAP    Past Surgical History:  Procedure Laterality Date  . CESAREAN SECTION     times 2  . HERNIA REPAIR    . PARTIAL KNEE ARTHROPLASTY Right 02/07/2019   Procedure: UNICOMPARTMENTAL KNEE;  Surgeon: Marchia Bond, MD;  Location: [facility];  Service: Orthopedics;  Laterality: Right;    There were no vitals filed for this visit.  Subjective Assessment - 02/16/19 1642    Subjective  [name] reports she has been having more pain the last two days, but today has been better. She was surprised how sore she was the day following her initial evaluation. She states she has been doing her HEP. She reports she has been having 4/10 pain in her R lumbar region that was present prior to her surgery, she noticed it while walking. She is unsure what makes it better or worse.    Pertinent History  Relevant past medical history and comorbidities include skin cancer 2016, fibromyalgia since2005, and pre-diabetes  etc.; surgeries include hernia repair and R unicompartmental knee arthroplasty performed 02/07/2019, etc., (See more details above)    Limitations  Walking;Lifting;Standing;House hold activities    How long can you stand comfortably?  10 mins    How long can you walk comfortably?  10 mins    Diagnostic tests  x-ray: Hemiarthroplasty medial aspect right knee.  Anatomic alignment.    Patient Stated Goals  Return to walk without pain and AD. Patient also want to walk her dogs.    Currently in Pain?  Yes    Pain Score  5     Pain Location  Knee    Pain Orientation  Right;Medial    Pain Descriptors / Indicators  Aching;Contraction;Grimacing    Pain Type  Surgical pain    Pain Onset  In the past 7 days        TREATMENT:  Therapeutic exercise:to centralize symptoms and improve ROM, strength, muscular endurance, and activity tolerance required for successful completion of functional activities.  - Back and forth in available range of motion with self-overpressure at end range progressing to 3 full revolutions at seat setting 9-10 to improve joint ROM. Patient then had a sudden pain in the medial distal thigh that she said was a burning like a pulled muscle. It  settled down with time and patient returned to back and forth with seat setting 11. Total for 13 minutes. 100 degrees flexion on bike. Returned to continuous full revolutions with seat setting 13.  - Seated long arc 2x10 each side 3# ankle weights.  - supine short arcs 4x10 reps each side with 3# ankle weights.  - supine active SLR with both heels elevated on low bolster, 2x10 each side.  Cuing for strong quad contraction, purpose/technique of exercise, expected and optimal response/meaning of sensation occurring with exercise.  - reviewed HEP.   Manual therapy: to reduce pain and tissue tension, improve range of motion, neuromodulation, in order to promote improved ability to complete functional activities. - supine orthopedic edema  massage using finger circle and scooping technique to encourage flushing of edema in thigh and lower leg. Leg in extended and flexed position at various times for comfort.   HOME EXERCISE PROGRAM Access Code: [email or web address]        URL: [email or web address]    Date: 02/13/2019  Prepared by: Rosita Kea   Exercises Supine Knee Extension Strengthening - 3 sets - 10 reps - 3s hold - 2-3x daily - 7x weekly Seated Long Arc Quad - 29mins hold - 2-3x daily - 7x weekly Seated Quad Set - 3 sets - 10 reps - 4 seconds hold - 2-3x daily - 7x weekly Rowing   PT Education - 02/16/19 1659    Education Details  Exercise purpose/form. Self management techniques    Person(s) Educated  Patient    Methods  Explanation;Demonstration;Tactile cues;Verbal cues    Comprehension  Verbalized understanding;Returned demonstration;Verbal cues required;Tactile cues required;Need further instruction       PT Short Term Goals - 02/16/19 1709      PT SHORT TERM GOAL #1   Title  Be independent with initial home exercise program for self-management of symptoms.    Baseline  initial HEP provided at IE (02/13/2019);    Time  2    Period  Weeks    Status  Achieved    Target Date  02/27/19        PT Long Term Goals - 02/13/19 1736      PT LONG TERM GOAL #1   Title  Be independent with a long-term home exercise program for self-management of symptoms.    Baseline  initial HEP provided at IE (02/13/2019)    Time  8    Period  Weeks    Status  New    Target Date  04/10/19      PT LONG TERM GOAL #2   Title  Demonstrate improved FOTO score by 10 units to demonstrate improvement in overall condition and self-reported functional ability.    Baseline  FOTO = 46 (02/13/2019)    Time  8    Period  Weeks    Status  New    Target Date  04/10/19      PT LONG TERM GOAL #3   Title  Reduce pain with functional activities to equal or less than 1/10 to allow patient to complete usual activities including  ADLs, IADLs, and social engagement with less difficulty.    Baseline  2/10 min; 8/10 max (02/13/2019)    Time  8    Period  Weeks    Status  New    Target Date  04/10/19      PT LONG TERM GOAL #4   Title  Complete community, work and/or recreational activities without limitation due to current condition.  Baseline  ADLs and walking her dogs, standing, lifting, weight bearing, and various movements(02/13/2019)    Time  8    Period  Weeks    Status  New    Target Date  04/10/19      PT LONG TERM GOAL #5   Title  Demonstrated R knee extension ROM to neutral and flexion ROM to 130 degrees with less or equal to 1/10 pain at end range to increase functional mobility and reduce fall risk.    Baseline  Flexion: 105 degrees; extension: 11 degrees to neutral  (02/13/2019)    Time  8    Period  Weeks    Status  New    Target Date  04/10/19            Plan - 02/16/19 1742    Clinical Impression Statement  Pt tolerated treatment well overall and is making progress towards goals. She had a sudden burning pain in the R medial distal thigh while flexing her knee on the bike that startled her. She was upset at first but was able to calm down and the pain returned to baseline with gentle ROM and she was able to return to full revolutions on the bike. Patient was able to tolerate progressive quad strengthening and ROM exercises. Reported decrease in pain from 5/10 to 4/10 by end of session. Did have some right sided low back and glute pain that she reported was present prior to surgery but was improved by end of session. Able to flex and extend spine without making back/glute pain worse. Pt was able to complete all exercises with minimal to no lasting increase in pain or discomfort. Pt required multimodal cuing for proper technique and to facilitate improved neuromuscular control, strength, range of motion, and functional ability resulting in improved performance and form. Patient would benefit from  continued physical therapy to address remaining impairments and functional limitations to work towards stated goals and return to PLOF or maximal functional independence    Personal Factors and Comorbidities  Comorbidity 2;Comorbidity 3+;Age    Comorbidities  Relevant past medical history and comorbidities include skin cancer 2016, fibromyalgia since2005, and pre-diabetes etc.; surgeries include hernia repair and R unicompartmental knee arthroplasty performed 02/07/2019, etc., (See more details above)    Examination-Activity Limitations  Bed Mobility;Bathing;Stairs;Squat;Toileting;Sit;Bend;Caring for Others;Lift;Transfers    Examination-Participation Restrictions  Cleaning;Laundry;Yard Work;Community Activity   work   Merchant [employer] officer  Stable/Uncomplicated    Rehab Potential  Good    PT Frequency  2x / week    PT Duration  8 weeks    PT Treatment/Interventions  ADLs/Self Care Home Management;Cryotherapy;Moist Heat;Stair training;Functional mobility training;Therapeutic activities;Therapeutic exercise;Balance training;Neuromuscular re-education;Manual techniques;Dry needling;Scar mobilization;Passive range of motion;Joint Manipulations;Spinal Manipulations;Electrical Stimulation;Patient/family education    PT Next Visit Plan  ROM, strengthening, stretching    PT Home Exercise Plan  [email or web address]    Consulted and Agree with Plan of Care  Patient       Patient will benefit from skilled therapeutic intervention in order to improve the following deficits and impairments:  Abnormal gait, Decreased balance, Decreased endurance, Decreased mobility, Difficulty walking, Hypomobility, Pain, Impaired flexibility, Decreased strength, Decreased activity tolerance, Decreased range of motion, Impaired perceived functional ability, Decreased skin integrity  Visit Diagnosis: Acute pain of right knee  Muscle weakness (generalized)  Unsteadiness on feet  Difficulty in walking, not  elsewhere classified     Problem List Patient Active Problem List   Diagnosis Date Noted  . Primary localized osteoarthritis of right  knee 02/07/2019  . Status post right partial knee replacement 02/07/2019  . Neck abscess 12/23/2018  . Pre-op evaluation 12/23/2018  . Abscess 12/12/2018  . Vagina itching 07/14/2018  . Ear discharge, bilateral 07/14/2018  . Weight gain 07/14/2018  . Class 1 obesity due to excess calories without serious comorbidity with body mass index (BMI) of 33.0 to 33.9 in adult 05/13/2018  . Prediabetes 12/03/2017  . Snoring 02/27/2016  . Non-restorative sleep 02/27/2016  . Vitamin D deficiency 10/28/2015  . Generalized anxiety disorder 11/25/2012  . HYPERTRIGLYCERIDEMIA 12/20/2008  . MENOPAUSE, EARLY 10/04/2008  . Fibromyalgia 10/04/2008    Everlean Alstrom. Graylon Good, PT, DPT 02/16/19, 5:43 PM   [place] [facility] [address], [place], [address] Phone: [phone]   Fax:  [phone]  Name: AYANIA WHITEHAIR MRN: [id number] Date of Birth: 05/14/1955

## 2019-02-21 ENCOUNTER — Other Ambulatory Visit: Payer: Self-pay

## 2019-02-21 ENCOUNTER — Encounter: Payer: Self-pay | Admitting: Physical Therapy

## 2019-02-21 ENCOUNTER — Ambulatory Visit: Payer: PRIVATE HEALTH INSURANCE | Admitting: Physical Therapy

## 2019-02-21 DIAGNOSIS — M6281 Muscle weakness (generalized): Secondary | ICD-10-CM

## 2019-02-21 DIAGNOSIS — M25561 Pain in right knee: Secondary | ICD-10-CM

## 2019-02-21 DIAGNOSIS — R2681 Unsteadiness on feet: Secondary | ICD-10-CM

## 2019-02-21 DIAGNOSIS — R262 Difficulty in walking, not elsewhere classified: Secondary | ICD-10-CM

## 2019-02-21 NOTE — Therapy (Signed)
[facility] [address], [place], [address] Phone: [phone]   Fax:  [phone]  Physical Therapy Treatment  Patient Details  Name: Jillian [name] MRN: [id number] Date of Birth: 1956/01/28 Referring Provider (PT): Johnny Bridge, MD   Encounter Date: 02/21/2019  PT End of Session - 02/21/19 1738    Visit Number  3    Number of Visits  16    Date for PT Re-Evaluation  04/10/19    Authorization Type  MEDCOST reporting from 02/13/2019    Authorization - Visit Number  3    Authorization - Number of Visits  10    PT Start Time  [id number]    PT Stop Time  1810    PT Time Calculation (min)  40 min    Activity Tolerance  Patient tolerated treatment well    Behavior During Therapy  [facility] for tasks assessed/performed       Past Medical History:  Diagnosis Date  . Cancer ([place])    basal cell 2016  . Fibromyalgia   . Pre-diabetes   . Primary localized osteoarthritis of right knee 02/07/2019  . Sleep apnea    uses CPAP    Past Surgical History:  Procedure Laterality Date  . CESAREAN SECTION     times 2  . HERNIA REPAIR    . PARTIAL KNEE ARTHROPLASTY Right 02/07/2019   Procedure: UNICOMPARTMENTAL KNEE;  Surgeon: Marchia Bond, MD;  Location: [facility];  Service: Orthopedics;  Laterality: Right;    There were no vitals filed for this visit.  Subjective Assessment - 02/21/19 1731    Subjective  Patient reports she is feeling pretty good today with 2/10 at the medial R knee. She states her knee felt good following her last treatment session and the day after but then she did a lot around the house and feels she over-did it so it was very sore along the medial leg. She saw her doctor yesterday who was pleased with her progress. her HEP is going well. She has stopped using her cane.    Pertinent History  Relevant past medical history and comorbidities include skin cancer 2016, fibromyalgia since2005, and  pre-diabetes etc.; surgeries include hernia repair and R unicompartmental knee arthroplasty performed 02/07/2019, etc., (See more details above)    Limitations  Walking;Lifting;Standing;House hold activities    How long can you stand comfortably?  10 mins    How long can you walk comfortably?  10 mins    Diagnostic tests  x-ray: Hemiarthroplasty medial aspect right knee.  Anatomic alignment.    Patient Stated Goals  Return to walk without pain and AD. Patient also want to walk her dogs.    Currently in Pain?  No/denies    Pain Score  2     Pain Onset  In the past 7 days       TREATMENT: Therapeutic exercise:to centralize symptoms and improve ROM, strength, muscular endurance, and activity tolerance required for successful completion of functional activities.  - Recumbent Bike for improved lower extremity ROM, muscular endurance, and activity tolerance; and to induce the analgesic effect of aerobic exercise, stimulate joint nutrition, and prepare body structures and systems for following interventions. x 5 min at seat setting 11, then progressively closer each 2 minutes to stretch up to seat setting 8.   115 AAROM flexion after.   - Seated long arc 3x10 each side 5# ankle weights.  - supine short arcs 3x10 reps  each side with 5# ankle weights.  - supine hamstring stretch with strap 3x30 seconds each side.  - standing mini squat x 10, strong cuing to decrease anterior tibial translation to improve comfort. Continued to be uncomfortable, so only completed one set.  - standing hamstring curl 3x10 each side with BUE support.  - standing TKE with red band x 10 on L side to feel correct form, 2x10 R side with 5 second hold. BUE support.  Cuing for strong quad contraction, purpose/technique of exercise, expected and optimal response/meaning of sensation occurring with exercise. Reveiwed and updated HEP.   Neuromuscular Re-education: to improve, balance, postural strength, muscle activation  patterns, and stabilization strength required for functional activities: - single leg balance: 3x30 seconds each side with touchdown support as needed. To improve proprioception and balance.  Cuing for technique and appropriate response.   HOME EXERCISE PROGRAM Access Code: [email or web address]  URL: [email or web address]  Date: 02/21/2019  Prepared by: Rosita Kea   Exercises Supine Heel Slide with Strap - 20 reps - 5 seconds hold - 2x daily Hamstring stretch (with strap) - 3 reps - 30 seconds hold - 2x daily Standing Single Leg Stance with Unilateral Counter Support - 3 reps - 30 seconds hold - 1-2x daily Standing Knee Flexion - 3 sets - 10 reps - 1-2x daily Standing Terminal Knee Extension with Resistance - 1 sets - 20 reps - 5 seconds hold - 1-2x daily    PT Education - 02/21/19 1817    Education Details  Exercise purpose/form. Self management techniques. updated HEP    Person(s) Educated  Patient    Methods  Explanation;Demonstration;Tactile cues;Verbal cues;Handout    Comprehension  Verbalized understanding;Returned demonstration;Verbal cues required;Tactile cues required       PT Short Term Goals - 02/16/19 1709      PT SHORT TERM GOAL #1   Title  Be independent with initial home exercise program for self-management of symptoms.    Baseline  initial HEP provided at IE (02/13/2019);    Time  2    Period  Weeks    Status  Achieved    Target Date  02/27/19        PT Long Term Goals - 02/13/19 1736      PT LONG TERM GOAL #1   Title  Be independent with a long-term home exercise program for self-management of symptoms.    Baseline  initial HEP provided at IE (02/13/2019)    Time  8    Period  Weeks    Status  New    Target Date  04/10/19      PT LONG TERM GOAL #2   Title  Demonstrate improved FOTO score by 10 units to demonstrate improvement in overall condition and self-reported functional ability.    Baseline  FOTO = 46 (02/13/2019)    Time  8    Period   Weeks    Status  New    Target Date  04/10/19      PT LONG TERM GOAL #3   Title  Reduce pain with functional activities to equal or less than 1/10 to allow patient to complete usual activities including ADLs, IADLs, and social engagement with less difficulty.    Baseline  2/10 min; 8/10 max (02/13/2019)    Time  8    Period  Weeks    Status  New    Target Date  04/10/19      PT LONG TERM GOAL #4   Title  Complete community, work and/or recreational activities without limitation due to current condition.    Baseline  ADLs and walking her dogs, standing, lifting, weight bearing, and various movements(02/13/2019)    Time  8    Period  Weeks    Status  New    Target Date  04/10/19      PT LONG TERM GOAL #5   Title  Demonstrated R knee extension ROM to neutral and flexion ROM to 130 degrees with less or equal to 1/10 pain at end range to increase functional mobility and reduce fall risk.    Baseline  Flexion: 105 degrees; extension: 11 degrees to neutral  (02/13/2019)    Time  8    Period  Weeks    Status  New    Target Date  04/10/19            Plan - 02/21/19 1819    Clinical Impression Statement  Pt tolerated treatment well and is making good progress towards goal.  Pt was able to complete all exercises with mild difficulty due to pain, particularly with weight bearing activities. Updated HEP to include well tolerated progressions. Patient continues to demonstrate improvements in R knee AAROM to 115 degrees. Pt required multimodal cuing for proper technique and to facilitate improved neuromuscular control, strength, range of motion, and functional ability resulting in improved performance and form. Patient continues to have deficits in strength, balance, ROM, and functional activity tolerance. Patient would benefit from continued physical therapy to address remaining impairments and functional limitations to work towards stated goals and return to PLOF or maximal functional  independence    Personal Factors and Comorbidities  Comorbidity 2;Comorbidity 3+;Age    Comorbidities  Relevant past medical history and comorbidities include skin cancer 2016, fibromyalgia since2005, and pre-diabetes etc.; surgeries include hernia repair and R unicompartmental knee arthroplasty performed 02/07/2019, etc., (See more details above)    Examination-Activity Limitations  Bed Mobility;Bathing;Stairs;Squat;Toileting;Sit;Bend;Caring for Others;Lift;Transfers    Examination-Participation Restrictions  Cleaning;Laundry;Yard Work;Community Activity   work   [employer]  Stable/Uncomplicated    Rehab Potential  Good    PT Frequency  2x / week    PT Duration  8 weeks    PT Treatment/Interventions  ADLs/Self Care Home Management;Cryotherapy;Moist Heat;Stair training;Functional mobility training;Therapeutic activities;Therapeutic exercise;Balance training;Neuromuscular re-education;Manual techniques;Dry needling;Scar mobilization;Passive range of motion;Joint Manipulations;Spinal Manipulations;Electrical Stimulation;Patient/family education    PT Next Visit Plan  ROM, strengthening, stretching    PT Home Exercise Plan  [email or web address]    Consulted and Agree with Plan of Care  Patient       Patient will benefit from skilled therapeutic intervention in order to improve the following deficits and impairments:  Abnormal gait, Decreased balance, Decreased endurance, Decreased mobility, Difficulty walking, Hypomobility, Pain, Impaired flexibility, Decreased strength, Decreased activity tolerance, Decreased range of motion, Impaired perceived functional ability, Decreased skin integrity  Visit Diagnosis: Acute pain of right knee  Muscle weakness (generalized)  Unsteadiness on feet  Difficulty in walking, not elsewhere classified     Problem List Patient Active Problem List   Diagnosis Date Noted  . Primary localized osteoarthritis of right knee 02/07/2019  .  Status post right partial knee replacement 02/07/2019  . Neck abscess 12/23/2018  . Pre-op evaluation 12/23/2018  . Abscess 12/12/2018  . Vagina itching 07/14/2018  . Ear discharge, bilateral 07/14/2018  . Weight gain 07/14/2018  . Class 1 obesity due to excess calories without serious comorbidity with body mass index (BMI) of 33.0 to 33.9 in adult  05/13/2018  . Prediabetes 12/03/2017  . Snoring 02/27/2016  . Non-restorative sleep 02/27/2016  . Vitamin D deficiency 10/28/2015  . Generalized anxiety disorder 11/25/2012  . HYPERTRIGLYCERIDEMIA 12/20/2008  . MENOPAUSE, EARLY 10/04/2008  . Fibromyalgia 10/04/2008    Everlean Alstrom. Graylon Good, PT, DPT 02/21/19, 6:20 PM  [facility] [address], [place], [address] Phone: [phone]   Fax:  [phone]  Name: Jillian [name] MRN: [id number] Date of Birth: 08-13-1955

## 2019-02-28 ENCOUNTER — Other Ambulatory Visit: Payer: Self-pay

## 2019-02-28 ENCOUNTER — Ambulatory Visit: Payer: PRIVATE HEALTH INSURANCE | Attending: Orthopedic Surgery | Admitting: Physical Therapy

## 2019-02-28 DIAGNOSIS — M25561 Pain in right knee: Secondary | ICD-10-CM

## 2019-02-28 DIAGNOSIS — M25562 Pain in left knee: Secondary | ICD-10-CM | POA: Diagnosis present

## 2019-02-28 DIAGNOSIS — R262 Difficulty in walking, not elsewhere classified: Secondary | ICD-10-CM | POA: Diagnosis present

## 2019-02-28 DIAGNOSIS — R2681 Unsteadiness on feet: Secondary | ICD-10-CM | POA: Insufficient documentation

## 2019-02-28 DIAGNOSIS — M6281 Muscle weakness (generalized): Secondary | ICD-10-CM | POA: Insufficient documentation

## 2019-02-28 NOTE — Therapy (Signed)
[place] [facility] [address] [place], [address] Phone: [phone]   Fax:  [phone]  Physical Therapy Treatment  Patient Details  Name: Jillian [name] MRN: [id number] Date of Birth: 03/08/1956 Referring Provider (PT): Johnny Bridge, MD   Encounter Date: 02/28/2019  PT End of Session - 02/28/19 1818    Visit Number  4    Number of Visits  16    Date for PT Re-Evaluation  04/10/19    Authorization Type  MEDCOST reporting from 02/13/2019    Authorization - Visit Number  4    Authorization - Number of Visits  10    PT Start Time  5102    PT Stop Time  5852    PT Time Calculation (min)  45 min    Activity Tolerance  Patient tolerated treatment well;Patient limited by pain;No increased pain    Behavior During Therapy  [facility] for tasks assessed/performed       Past Medical History:  Diagnosis Date  . Cancer ([place])    basal cell 2016  . Fibromyalgia   . Pre-diabetes   . Primary localized osteoarthritis of right knee 02/07/2019  . Sleep apnea    uses CPAP    Past Surgical History:  Procedure Laterality Date  . CESAREAN SECTION     times 2  . HERNIA REPAIR    . PARTIAL KNEE ARTHROPLASTY Right 02/07/2019   Procedure: UNICOMPARTMENTAL KNEE;  Surgeon: Marchia Bond, MD;  Location: [facility];  Service: Orthopedics;  Laterality: Right;    There were no vitals filed for this visit.     Subjective Assessment - 02/28/19 1721    Subjective  Patient reports that the morning after her last treatment session her L knee was very painful near the medial joint line and has worsened or stayed about the same since then. Worse with weight bearing, Has beein doing a lot of things around the house, maybe a bit more than usual 2 hours a day. She has been working on getting her R knee extension. She has had at least 2 times her left leg buckled. She feels like if she is not ocmpletely conscious of it it may buckle. She feels  very insecure in the shower. She feels her R leg is much better than the L, especially going up and down the stairs. Only has pain in the left when weight bearing and terminal knee flexion.    Pertinent History  Relevant past medical history and comorbidities include skin cancer 2016, fibromyalgia since2005, and pre-diabetes etc.; surgeries include hernia repair and R unicompartmental knee arthroplasty performed 02/07/2019, etc., (See more details above)    Limitations  Walking;Lifting;Standing;House hold activities    How long can you stand comfortably?  10 mins    How long can you walk comfortably?  10 mins    Diagnostic tests  x-ray: Hemiarthroplasty medial aspect right knee.  Anatomic alignment.    Patient Stated Goals  Return to walk without pain and AD. Patient also want to walk her dogs.    Currently in Pain?  Yes    Pain Score  5     Pain Location  Knee    Pain Orientation  Left;Right;Medial    Pain Onset  In the past 7 days        OBJECTIVE  Palpation L knee medial joint line TTP, reproduces pain  AAROM Knee R/L Flexion: 120*/133*  Extension: -3*/0 *indicates pain   L  Knee Special Tests:  - McMurray's dynamic testing with tibia IR = slight catching and pain at ~ 20 degrees flexion - valgus stress at 0 and 30 degrees = positive for pain.  - recurvatum overpressure = positive for pain.  - Thessaly test = positive for pain both directions of rotation, patient with diffiuclty maintaining single leg stance or flexed knee.  TREATMENT: Therapeutic exercise:to centralize symptoms and improve ROM, strength, muscular endurance, and activity tolerance required for successful completion of functional activities.  - Recumbent Bike for improved lower extremity ROM, muscular endurance, and activity tolerance; and to induce the analgesic effect of aerobic exercise, stimulate joint nutrition, and prepare body structures and systems for following interventions. x 5 min at seat setting  10, then progressively closer each 2 minutes at seat setting 7.  Could not get closer due to pain with flexion at L knee.   - objective exam of L knee and R knee ROM (See above) to assess R knee progress and L knee pain.   - Seated long arc3x10 each side 7.5# ankle weights.L knee painful in neutral but no pain with slight IR of tibia so completed in this position.  - seated hamstring curl against green theraband secured by physical therapist.    Cuing for, purpose/technique of exercise/tests, expected and optimal response/meaning of sensation occurring with exercise. Education on examination findings, advice and reassurance for L knee care.   Manual therapy: to reduce pain and tissue tension, improve range of motion, neuromodulation, in order to promote improved ability to complete functional activities. - scar massage and pattellar glides to R knee to decrease soft tissue restriction to motion.    Therapeutic exercise was performed independently and separately from manual interventions and required specific cuing.    HOME EXERCISE PROGRAM Access Code: [email or web address]        URL: [email or web address]    Date: 02/21/2019  Prepared by: Rosita Kea   Exercises Supine Heel Slide with Strap - 20 reps - 5 seconds hold - 2x daily Hamstring stretch (with strap) - 3 reps - 30 seconds hold - 2x daily Standing Single Leg Stance with Unilateral Counter Support - 3 reps - 30 seconds hold - 1-2x daily Standing Knee Flexion - 3 sets - 10 reps - 1-2x daily Standing Terminal Knee Extension with Resistance - 1 sets - 20 reps - 5 seconds hold - 1-2x daily   PT Education - 02/28/19 1817    Education Details  Exercise purpose/form. Self management techniques. Advised patient on activity modification, use of ice, and provided reassurance about L knee pain. Suggested assistive device and shower seat for safety.    Person(s) Educated  Patient    Methods  Explanation;Demonstration;Tactile  cues;Verbal cues    Comprehension  Verbalized understanding;Returned demonstration;Verbal cues required;Tactile cues required;Need further instruction       PT Short Term Goals - 02/16/19 1709      PT SHORT TERM GOAL #1   Title  Be independent with initial home exercise program for self-management of symptoms.    Baseline  initial HEP provided at IE (02/13/2019);    Time  2    Period  Weeks    Status  Achieved    Target Date  02/27/19        PT Long Term Goals - 02/28/19 1819      PT LONG TERM GOAL #1   Title  Be independent with a long-term home exercise program for self-management of symptoms.    Baseline  initial  HEP provided at IE (02/13/2019)    Time  8    Period  Weeks    Status  Partially Met    Target Date  04/09/18      PT LONG TERM GOAL #2   Title  Demonstrate improved FOTO score by 10 units to demonstrate improvement in overall condition and self-reported functional ability.    Baseline  FOTO = 46 (02/13/2019)    Time  8    Period  Weeks    Status  Unable to assess    Target Date  04/09/18      PT LONG TERM GOAL #3   Title  Reduce pain with functional activities to equal or less than 1/10 to allow patient to complete usual activities including ADLs, IADLs, and social engagement with less difficulty.    Baseline  2/10 min; 8/10 max (02/13/2019)    Time  8    Period  Weeks    Status  Partially Met    Target Date  04/09/18      PT LONG TERM GOAL #4   Title  Complete community, work and/or recreational activities without limitation due to current condition.    Baseline  ADLs and walking her dogs, standing, lifting, weight bearing, and various movements(02/13/2019)    Time  8    Period  Weeks    Status  Partially Met    Target Date  04/09/18      PT LONG TERM GOAL #5   Title  Demonstrated R knee extension ROM to neutral and flexion ROM to 130 degrees with less or equal to 1/10 pain at end range to increase functional mobility and reduce fall risk.     Baseline  Flexion: 105 degrees; extension: 11 degrees to neutral  (02/13/2019); flexion = 120, extension -3 degrees (02/28/2019);    Time  8    Period  Weeks    Status  Partially Met    Target Date  04/09/18            Plan - 02/28/19 1817    Clinical Impression Statement  Patient tolerated treatment well and stated her L knee felt better by end of session. R knee tolerated all exercise well and was less painful with increased weight with long arc quad. L knee pain was assessed. Pain pattern and special tests doe suggest medial meniscal involvement but are at risk for false positive. Advised patient on activity modification, use of ice, and provided reassurance about L knee pain. Modified today's exercises to be in non-weight bearing position to accommodate L knee pain. R knee appears to be progressing well in ROM and strength. Patient would benefit from continued physical therapy to address remaining impairments and functional limitations to work towards stated goals and return to PLOF or maximal functional independence.    Personal Factors and Comorbidities  Comorbidity 2;Comorbidity 3+;Age    Comorbidities  Relevant past medical history and comorbidities include skin cancer 2016, fibromyalgia since2005, and pre-diabetes etc.; surgeries include hernia repair and R unicompartmental knee arthroplasty performed 02/07/2019, etc., (See more details above)    Examination-Activity Limitations  Bed Mobility;Bathing;Stairs;Squat;Toileting;Sit;Bend;Caring for Others;Lift;Transfers    Examination-Participation Restrictions  Cleaning;Laundry;Yard Work;Community Activity   work   [employer]  Stable/Uncomplicated    Rehab Potential  Good    PT Frequency  2x / week    PT Duration  8 weeks    PT Treatment/Interventions  ADLs/Self Care Home Management;Cryotherapy;Moist Heat;Stair training;Functional mobility training;Therapeutic activities;Therapeutic exercise;Balance  training;Neuromuscular re-education;Manual  techniques;Dry needling;Scar mobilization;Passive range of motion;Joint Manipulations;Spinal Manipulations;Electrical Stimulation;Patient/family education    PT Next Visit Plan  ROM, strengthening, stretching    PT Home Exercise Plan  [email or web address]    Consulted and Agree with Plan of Care  Patient       Patient will benefit from skilled therapeutic intervention in order to improve the following deficits and impairments:  Abnormal gait, Decreased balance, Decreased endurance, Decreased mobility, Difficulty walking, Hypomobility, Pain, Impaired flexibility, Decreased strength, Decreased activity tolerance, Decreased range of motion, Impaired perceived functional ability, Decreased skin integrity  Visit Diagnosis: Acute pain of right knee  Muscle weakness (generalized)  Unsteadiness on feet  Difficulty in walking, not elsewhere classified     Problem List Patient Active Problem List   Diagnosis Date Noted  . Primary localized osteoarthritis of right knee 02/07/2019  . Status post right partial knee replacement 02/07/2019  . Neck abscess 12/23/2018  . Pre-op evaluation 12/23/2018  . Abscess 12/12/2018  . Vagina itching 07/14/2018  . Ear discharge, bilateral 07/14/2018  . Weight gain 07/14/2018  . Class 1 obesity due to excess calories without serious comorbidity with body mass index (BMI) of 33.0 to 33.9 in adult 05/13/2018  . Prediabetes 12/03/2017  . Snoring 02/27/2016  . Non-restorative sleep 02/27/2016  . Vitamin D deficiency 10/28/2015  . Generalized anxiety disorder 11/25/2012  . HYPERTRIGLYCERIDEMIA 12/20/2008  . MENOPAUSE, EARLY 10/04/2008  . Fibromyalgia 10/04/2008    Everlean Alstrom. Graylon Good, PT, DPT 02/28/19, 6:22 PM  [facility] [address], [place], [address] Phone: [phone]   Fax:  [phone]  Name: Jillian [name] MRN: [id number] Date of  Birth: 1955-05-04

## 2019-03-01 ENCOUNTER — Other Ambulatory Visit: Payer: Self-pay | Admitting: Family Medicine

## 2019-03-01 NOTE — Telephone Encounter (Signed)
Last office visit 12/23/2018 for hospital follow up-neck abscess.  Last refilled 02/02/2019 for #180 with no refills.  CPE scheduled for 03/10/2019.

## 2019-03-02 ENCOUNTER — Other Ambulatory Visit: Payer: Self-pay

## 2019-03-02 ENCOUNTER — Encounter: Payer: Self-pay | Admitting: Physical Therapy

## 2019-03-02 ENCOUNTER — Ambulatory Visit: Payer: PRIVATE HEALTH INSURANCE | Admitting: Physical Therapy

## 2019-03-02 DIAGNOSIS — R262 Difficulty in walking, not elsewhere classified: Secondary | ICD-10-CM

## 2019-03-02 DIAGNOSIS — M6281 Muscle weakness (generalized): Secondary | ICD-10-CM

## 2019-03-02 DIAGNOSIS — M25561 Pain in right knee: Secondary | ICD-10-CM

## 2019-03-02 DIAGNOSIS — R2681 Unsteadiness on feet: Secondary | ICD-10-CM

## 2019-03-02 NOTE — Therapy (Signed)
[facility] [address], [place], [address] Phone: [phone]   Fax:  [phone]  Physical Therapy Treatment  Patient Details  Name: Jillian [name] MRN: [id number] Date of Birth: 06-17-1955 Referring Provider (PT): Johnny Bridge, MD   Encounter Date: 03/02/2019  PT End of Session - 03/02/19 1758    Visit Number  5    Number of Visits  16    Date for PT Re-Evaluation  04/10/19    Authorization Type  MEDCOST reporting from 02/13/2019    Authorization - Visit Number  5    Authorization - Number of Visits  10    PT Start Time  9678    PT Stop Time  9381    PT Time Calculation (min)  43 min    Activity Tolerance  Patient tolerated treatment well;Patient limited by pain;No increased pain    Behavior During Therapy  WFL for tasks assessed/performed       Past Medical History:  Diagnosis Date  . Cancer ([place])    basal cell 2016  . Fibromyalgia   . Pre-diabetes   . Primary localized osteoarthritis of right knee 02/07/2019  . Sleep apnea    uses CPAP    Past Surgical History:  Procedure Laterality Date  . CESAREAN SECTION     times 2  . HERNIA REPAIR    . PARTIAL KNEE ARTHROPLASTY Right 02/07/2019   Procedure: UNICOMPARTMENTAL KNEE;  Surgeon: Marchia Bond, MD;  Location: [facility];  Service: Orthopedics;  Laterality: Right;    There were no vitals filed for this visit.  Subjective Assessment - 03/02/19 1705    Subjective  Patient report she has been without pain medication for about two days so her pain is elevated at 5/10 in L knee and R knee 4/10. She got very concerned about her L knee and called her doctor who wants to see her next Wednesday for it. She hopes if she needs surgery it will be done before the end of the year. Her left knee is a stabbing pain and the R knee is just achy. She reports she felt the PT exam of her L knee last session was very helpful. She has been doing some rowing. She  is concerned that she may not be able to do as much today because if elevated pain, but wants to do everything that was planned prior. States she felt okay following last treatment session. Reports L knee has been buckling less.    Pertinent History  Relevant past medical history and comorbidities include skin cancer 2016, fibromyalgia since2005, and pre-diabetes etc.; surgeries include hernia repair and R unicompartmental knee arthroplasty performed 02/07/2019, etc., (See more details above)    Limitations  Walking;Lifting;Standing;House hold activities    How long can you stand comfortably?  10 mins    How long can you walk comfortably?  10 mins    Diagnostic tests  x-ray: Hemiarthroplasty medial aspect right knee.  Anatomic alignment.    Patient Stated Goals  Return to walk without pain and AD. Patient also want to walk her dogs.    Pain Score  5     Pain Location  Knee    Pain Onset  In the past 7 days       OBJECTIVE  Palpation L knee medial joint line TTP, reproduces pain  AAROM Knee R/L Flexion: 120*/133*  Extension:-3*/0 *indicates pain   L Knee Special Tests:  - McMurray's dynamic  testing with tibia IR = slight catching and pain at ~ 20 degrees flexion - valgus stress at 0 and 30 degrees = positive for pain.  - recurvatum overpressure = positive for pain.  - Thessaly test = positive for pain both directions of rotation, patient with diffiuclty maintaining single leg stance or flexed knee.  TREATMENT: Therapeutic exercise:to centralize symptoms and improve ROM, strength, muscular endurance, and activity tolerance required for successful completion of functional activities. -Recumbent Bikefor improved lower extremity ROM, muscular endurance, and activity tolerance; and to induce the analgesic effect of aerobic exercise, stimulate joint nutrition, and prepare body structures and systems for following interventions. x5 min at seat setting 10, then progressively closer  each 2 minutes at seat setting 6-3. Limited by L knee pain. Able to get around R knee one time at seat setting 1. AAROM R knee flexion = 127 following.   - Seated long arc3x10 each side10# ankle weights.L knee painful in neutral but no pain with slight IR of tibia so completed in this position.   - seated hamstring curl against green theraband secured by physical therapist. 3x10 each side.    - supine R hamstring stretch with strap with manual cuing for improved knee extension, 3x30 seconds.   - side stepping with green band around distal thighs, CGA for safety. 2x20 feet each direction.   Cuing for, purpose/technique of exercise, expected and optimal response/meaning of sensation occurring with exercise.Education on knee anatomy, surgical procedures, popping in L patellar region with LAQ.   HOME EXERCISE PROGRAM Access Code: [email or web address] URL: [email or web address] Date: 02/21/2019  Prepared by: Rosita Kea   Exercises Supine Heel Slide with Strap - 20 reps - 5 seconds hold - 2x daily Hamstring stretch (with strap) - 3 reps - 30 seconds hold - 2x daily Standing Single Leg Stance with Unilateral Counter Support - 3 reps - 30 seconds hold - 1-2x daily Standing Knee Flexion - 3 sets - 10 reps - 1-2x daily Standing Terminal Knee Extension with Resistance - 1 sets - 20 reps - 5 seconds hold - 1-2x daily   PT Education - 03/02/19 1757    Education Details  Exercise purpose/form. Self management techniques. education on anatomy, clicking in L patella with LAQ    Person(s) Educated  Patient    Methods  Explanation;Demonstration;Tactile cues;Verbal cues    Comprehension  Verbalized understanding;Returned demonstration;Verbal cues required;Tactile cues required;Need further instruction       PT Short Term Goals - 02/16/19 1709      PT SHORT TERM GOAL #1   Title  Be independent with initial home exercise program for self-management of symptoms.    Baseline   initial HEP provided at IE (02/13/2019);    Time  2    Period  Weeks    Status  Achieved    Target Date  02/27/19        PT Long Term Goals - 02/28/19 1819      PT LONG TERM GOAL #1   Title  Be independent with a long-term home exercise program for self-management of symptoms.    Baseline  initial HEP provided at IE (02/13/2019)    Time  8    Period  Weeks    Status  Partially Met    Target Date  04/09/18      PT LONG TERM GOAL #2   Title  Demonstrate improved FOTO score by 10 units to demonstrate improvement in overall condition and self-reported functional ability.    Baseline  FOTO = 46 (02/13/2019)    Time  8    Period  Weeks    Status  Unable to assess    Target Date  04/09/18      PT LONG TERM GOAL #3   Title  Reduce pain with functional activities to equal or less than 1/10 to allow patient to complete usual activities including ADLs, IADLs, and social engagement with less difficulty.    Baseline  2/10 min; 8/10 max (02/13/2019)    Time  8    Period  Weeks    Status  Partially Met    Target Date  04/09/18      PT LONG TERM GOAL #4   Title  Complete community, work and/or recreational activities without limitation due to current condition.    Baseline  ADLs and walking her dogs, standing, lifting, weight bearing, and various movements(02/13/2019)    Time  8    Period  Weeks    Status  Partially Met    Target Date  04/09/18      PT LONG TERM GOAL #5   Title  Demonstrated R knee extension ROM to neutral and flexion ROM to 130 degrees with less or equal to 1/10 pain at end range to increase functional mobility and reduce fall risk.    Baseline  Flexion: 105 degrees; extension: 11 degrees to neutral  (02/13/2019); flexion = 120, extension -3 degrees (02/28/2019);    Time  8    Period  Weeks    Status  Partially Met    Target Date  04/09/18            Plan - 03/02/19 1756    Clinical Impression Statement  Patient tolerated treatment well and is making  excellent progress towards goals with R knee. L knee still painful but not as much as last session and both knees were able to tolerate increase in resistance and improved ROM. R knee flexion up to 127 degrees AAROM on bike. Weight bearing exercises minimized today due to L knee pain. Patient reported decreased pain overall y end of session with only pain complaint at L medial knee following. Patient benefits from additional education throughout session. Patient continues to have impairments including pain, stiffness, weakness, and decreased activity tolerance and balance that lead to functional mobility deficits and difficulty in participating in usual activities and decreased quality of life. Pt required multimodal cuing for proper technique and to facilitate improved neuromuscular control, strength, range of motion, and functional ability resulting in improved performance and form. Patient continues to have deficits in strength, balance, ROM, and functional activity tolerance. Patient would benefit from continued physical therapy to address remaining impairments and functional limitations to work towards stated goals and return to PLOF or maximal functional independence    Personal Factors and Comorbidities  Comorbidity 2;Comorbidity 3+;Age    Comorbidities  Relevant past medical history and comorbidities include skin cancer 2016, fibromyalgia since2005, and pre-diabetes etc.; surgeries include hernia repair and R unicompartmental knee arthroplasty performed 02/07/2019, etc., (See more details above)    Examination-Activity Limitations  Bed Mobility;Bathing;Stairs;Squat;Toileting;Sit;Bend;Caring for Others;Lift;Transfers    Examination-Participation Restrictions  Cleaning;Laundry;Yard Work;Community Activity   work   [employer]  Stable/Uncomplicated    Rehab Potential  Good    PT Frequency  2x / week    PT Duration  8 weeks    PT Treatment/Interventions  ADLs/Self Care Home  Management;Cryotherapy;Moist Heat;Stair training;Functional mobility training;Therapeutic activities;Therapeutic exercise;Balance training;Neuromuscular re-education;Manual techniques;Dry needling;Scar mobilization;Passive range of motion;Joint  Manipulations;Spinal Manipulations;Electrical Stimulation;Patient/family education    PT Next Visit Plan  ROM, strengthening, stretching    PT Home Exercise Plan  [email or web address]:[email or web address]    Consulted and Agree with Plan of Care  Patient       Patient will benefit from skilled therapeutic intervention in order to improve the following deficits and impairments:  Abnormal gait, Decreased balance, Decreased endurance, Decreased mobility, Difficulty walking, Hypomobility, Pain, Impaired flexibility, Decreased strength, Decreased activity tolerance, Decreased range of motion, Impaired perceived functional ability, Decreased skin integrity  Visit Diagnosis: Acute pain of right knee  Muscle weakness (generalized)  Unsteadiness on feet  Difficulty in walking, not elsewhere classified     Problem List Patient Active Problem List   Diagnosis Date Noted  . Primary localized osteoarthritis of right knee 02/07/2019  . Status post right partial knee replacement 02/07/2019  . Neck abscess 12/23/2018  . Pre-op evaluation 12/23/2018  . Abscess 12/12/2018  . Vagina itching 07/14/2018  . Ear discharge, bilateral 07/14/2018  . Weight gain 07/14/2018  . Class 1 obesity due to excess calories without serious comorbidity with body mass index (BMI) of 33.0 to 33.9 in adult 05/13/2018  . Prediabetes 12/03/2017  . Snoring 02/27/2016  . Non-restorative sleep 02/27/2016  . Vitamin D deficiency 10/28/2015  . Generalized anxiety disorder 11/25/2012  . HYPERTRIGLYCERIDEMIA 12/20/2008  . MENOPAUSE, EARLY 10/04/2008  . Fibromyalgia 10/04/2008    Everlean Alstrom. Graylon Good, PT, DPT 03/02/19, 5:59 PM  [facility] [address] [place], [address] Phone: [phone]   Fax:  [phone]  Name: Jillian [name] MRN: [id number] Date of Birth: [birth year]

## 2019-03-03 ENCOUNTER — Telehealth: Payer: Self-pay | Admitting: Family Medicine

## 2019-03-03 ENCOUNTER — Other Ambulatory Visit (INDEPENDENT_AMBULATORY_CARE_PROVIDER_SITE_OTHER): Payer: PRIVATE HEALTH INSURANCE

## 2019-03-03 DIAGNOSIS — E781 Pure hyperglyceridemia: Secondary | ICD-10-CM | POA: Diagnosis not present

## 2019-03-03 DIAGNOSIS — E559 Vitamin D deficiency, unspecified: Secondary | ICD-10-CM | POA: Diagnosis not present

## 2019-03-03 DIAGNOSIS — R7303 Prediabetes: Secondary | ICD-10-CM

## 2019-03-03 LAB — COMPREHENSIVE METABOLIC PANEL
ALT: 20 U/L (ref 0–35)
AST: 17 U/L (ref 0–37)
Albumin: 3.9 g/dL (ref 3.5–5.2)
Alkaline Phosphatase: 112 U/L (ref 39–117)
BUN: 12 mg/dL (ref 6–23)
CO2: 27 mEq/L (ref 19–32)
Calcium: 9.5 mg/dL (ref 8.4–10.5)
Chloride: 105 mEq/L (ref 96–112)
Creatinine, Ser: 0.8 mg/dL (ref 0.40–1.20)
GFR: 72.08 mL/min (ref >60.00)
Glucose, Bld: 118 mg/dL — ABNORMAL HIGH (ref 70–99)
Potassium: 4.3 mEq/L (ref 3.5–5.1)
Sodium: 140 mEq/L (ref 135–145)
Total Bilirubin: 0.3 mg/dL (ref 0.2–1.2)
Total Protein: 6.3 g/dL (ref 6.0–8.3)

## 2019-03-03 LAB — LIPID PANEL
Cholesterol: 138 mg/dL (ref 0–200)
HDL: 26.5 mg/dL — ABNORMAL LOW (ref >39.00)
LDL Cholesterol: 73 mg/dL (ref 0–99)
NonHDL: 111.58
Total CHOL/HDL Ratio: 5
Triglycerides: 193 mg/dL — ABNORMAL HIGH (ref 0.0–149.0)
VLDL: 38.6 mg/dL (ref 0.0–40.0)

## 2019-03-03 LAB — VITAMIN D 25 HYDROXY (VIT D DEFICIENCY, FRACTURES): VITD: 25.01 ng/mL — ABNORMAL LOW (ref 30.00–100.00)

## 2019-03-03 LAB — HEMOGLOBIN A1C: Hgb A1c MFr Bld: 5.6 % (ref 4.6–6.5)

## 2019-03-03 NOTE — Progress Notes (Signed)
No critical labs need to be addressed urgently. We will discuss labs in detail at upcoming office visit.   

## 2019-03-03 NOTE — Telephone Encounter (Signed)
-----   Message from Ellamae Sia sent at 02/15/2019  3:15 PM EST ----- Regarding: Lab orders for Friday, 12.4.20 Patient is scheduled for CPX labs, please order future labs, Thanks , Karna Christmas

## 2019-03-07 ENCOUNTER — Ambulatory Visit: Payer: PRIVATE HEALTH INSURANCE | Admitting: Physical Therapy

## 2019-03-07 ENCOUNTER — Encounter: Payer: Self-pay | Admitting: Physical Therapy

## 2019-03-07 ENCOUNTER — Other Ambulatory Visit: Payer: Self-pay

## 2019-03-07 DIAGNOSIS — M6281 Muscle weakness (generalized): Secondary | ICD-10-CM

## 2019-03-07 DIAGNOSIS — M25561 Pain in right knee: Secondary | ICD-10-CM

## 2019-03-07 DIAGNOSIS — R2681 Unsteadiness on feet: Secondary | ICD-10-CM

## 2019-03-07 DIAGNOSIS — R262 Difficulty in walking, not elsewhere classified: Secondary | ICD-10-CM

## 2019-03-07 NOTE — Therapy (Signed)
[facility] [address] [place], [address] Phone: [phone]   Fax:  [phone]  Physical Therapy Treatment  Patient Details  Name: Jillian [name] MRN: [id number] Date of Birth: 05-02-1955 Referring Provider (PT): Johnny Bridge, MD   Encounter Date: 03/07/2019  PT End of Session - 03/07/19 1708    Visit Number  6    Number of Visits  16    Date for PT Re-Evaluation  04/10/19    Authorization Type  MEDCOST reporting from 02/13/2019    Authorization - Visit Number  6    Authorization - Number of Visits  10    PT Start Time  1155   patient late   PT Stop Time  1740    PT Time Calculation (min)  45 min    Activity Tolerance  Patient tolerated treatment well;Patient limited by pain    Behavior During Therapy  [facility] for tasks assessed/performed       Past Medical History:  Diagnosis Date  . Cancer ([place])    basal cell 2016  . Fibromyalgia   . Pre-diabetes   . Primary localized osteoarthritis of right knee 02/07/2019  . Sleep apnea    uses CPAP    Past Surgical History:  Procedure Laterality Date  . CESAREAN SECTION     times 2  . HERNIA REPAIR    . PARTIAL KNEE ARTHROPLASTY Right 02/07/2019   Procedure: UNICOMPARTMENTAL KNEE;  Surgeon: Marchia Bond, MD;  Location: [facility];  Service: Orthopedics;  Laterality: Right;    There were no vitals filed for this visit.  Subjective Assessment - 03/07/19 1655    Subjective  Patient reports she is doing great today. Since last treatment session she had one night when she went to bed at 9pm but awoke with 10/10 pain at the R knee all over and could not sleep. She used ice, norco, lyrical to manage the pain and it went away by noon the next mornig. Decribes as a burning feeling. Has not felt like that since. Left knee is feeling much better. She is doing a few of her HEP each day including side stepping. She took her dog for a walk 1/4 mile twice.    Pertinent History  Relevant past medical history and comorbidities include skin cancer 2016, fibromyalgia since2005, and pre-diabetes etc.; surgeries include hernia repair and R unicompartmental knee arthroplasty performed 02/07/2019, etc., (See more details above)    Limitations  Walking;Lifting;Standing;House hold activities    How long can you stand comfortably?  10 mins    How long can you walk comfortably?  10 mins    Diagnostic tests  x-ray: Hemiarthroplasty medial aspect right knee.  Anatomic alignment.    Patient Stated Goals  Return to walk without pain and AD. Patient also want to walk her dogs.    Currently in Pain?  No/denies    Pain Onset  In the past 7 days       OBJECTIVE  TREATMENT: Therapeutic exercise:to centralize symptoms and improve ROM, strength, muscular endurance, and activity tolerance required for successful completion of functional activities.  -Recumbent Bikefor improved lower extremity ROM, muscular endurance, and activity tolerance; and to induce the analgesic effect of aerobic exercise, stimulate joint nutrition, and prepare body structures and systems for following interventions. x5 min at seat setting 7, then progressively closer each 2 minutesatseat setting 4, 3, 2, 1. AAROM R knee flexion = 130 following.   - Seated  long arc3x10 each side15# ankle weights.L knee painful in neutral but no pain with slight IR of tibia so completed in this position.    - step up to 6 inch step with unilateral UE support for safety. 3x10 each side.   - tandem stance then single leg stance ball (2kg) ball tosses x10 each side, each position. Supervision for safety. To improve balance for single leg activities.   Cuing for form and education on purpose/technique of exercise, expected and optimal response/meaning of sensation occurring with exercise. Educated on scar massage and updated HEP.   Manual therapy: to reduce pain and tissue tension, improve range of  motion, neuromodulation, in order to promote improved ability to complete functional activities. - scar massage and patellar mobilizations in all directions grades III-IV to improve motion at the right knee joint. Educated pt on self-management techniques for scar massage.   HOME EXERCISE PROGRAM Access Code: [email or web address]  URL: [email or web address]  Date: 03/07/2019  Prepared by: Rosita Kea   Exercises Supine Heel Slide with Strap - 20 reps - 5 seconds hold - 2x daily Hamstring stretch (with strap) - 3 reps - 30 seconds hold - 2x daily Standing Single Leg Stance with Unilateral Counter Support - 3 reps - 30 seconds hold - 1-2x daily Standing Knee Flexion - 3 sets - 10 reps - 1-2x daily Step Up - 3 sets - 10 reps - 1-2x daily Patient Education Scar Massage    PT Education - 03/07/19 1707    Education Details  Exercise purpose/form. Self management techniques.    Person(s) Educated  Patient    Methods  Explanation;Demonstration;Tactile cues;Verbal cues    Comprehension  Verbalized understanding;Returned demonstration;Verbal cues required;Tactile cues required;Need further instruction       PT Short Term Goals - 02/16/19 1709      PT SHORT TERM GOAL #1   Title  Be independent with initial home exercise program for self-management of symptoms.    Baseline  initial HEP provided at IE (02/13/2019);    Time  2    Period  Weeks    Status  Achieved    Target Date  02/27/19        PT Long Term Goals - 03/07/19 1753      PT LONG TERM GOAL #1   Title  Be independent with a long-term home exercise program for self-management of symptoms.    Baseline  initial HEP provided at IE (02/13/2019)    Time  8    Period  Weeks    Status  Partially Met    Target Date  04/10/19      PT LONG TERM GOAL #2   Title  Demonstrate improved FOTO score by 10 units to demonstrate improvement in overall condition and self-reported functional ability.    Baseline  FOTO = 46 (02/13/2019)     Time  8    Period  Weeks    Status  Unable to assess    Target Date  04/10/19      PT LONG TERM GOAL #3   Title  Reduce pain with functional activities to equal or less than 1/10 to allow patient to complete usual activities including ADLs, IADLs, and social engagement with less difficulty.    Baseline  2/10 min; 8/10 max (02/13/2019)    Time  8    Period  Weeks    Status  Partially Met    Target Date  04/10/19      PT LONG TERM GOAL #4  Title  Complete community, work and/or recreational activities without limitation due to current condition.    Baseline  ADLs and walking her dogs, standing, lifting, weight bearing, and various movements(02/13/2019)    Time  8    Period  Weeks    Status  Partially Met    Target Date  04/10/19      PT LONG TERM GOAL #5   Title  Demonstrated R knee extension ROM to neutral and flexion ROM to 130 degrees with less or equal to 1/10 pain at end range to increase functional mobility and reduce fall risk.    Baseline  Flexion: 105 degrees; extension: 11 degrees to neutral  (02/13/2019); flexion = 120, extension -3 degrees (02/28/2019);    Time  8    Period  Weeks    Status  Achieved    Target Date  04/10/19            Plan - 03/07/19 1753    Clinical Impression Statement  Patient tolerated treatment well and is making great progress towards goals at this point. She demonstrated 130 degrees of AAROM R knee flexion, meeting her goal. She was also able to progress to more closed chain exercises without increased pain. HEP updated to reflect progress. L knee feeling much better as well this session. Patient would benefit from continued physical therapy to address remaining impairments and functional limitations to work towards stated goals and return to PLOF or maximal functional independence.    Personal Factors and Comorbidities  Comorbidity 2;Comorbidity 3+;Age    Comorbidities  Relevant past medical history and comorbidities include skin cancer  2016, fibromyalgia since2005, and pre-diabetes etc.; surgeries include hernia repair and R unicompartmental knee arthroplasty performed 02/07/2019, etc., (See more details above)    Examination-Activity Limitations  Bed Mobility;Bathing;Stairs;Squat;Toileting;Sit;Bend;Caring for Others;Lift;Transfers    Examination-Participation Restrictions  Cleaning;Laundry;Yard Work;Community Activity   work   [employer]  Stable/Uncomplicated    Rehab Potential  Good    PT Frequency  2x / week    PT Duration  8 weeks    PT Treatment/Interventions  ADLs/Self Care Home Management;Cryotherapy;Moist Heat;Stair training;Functional mobility training;Therapeutic activities;Therapeutic exercise;Balance training;Neuromuscular re-education;Manual techniques;Dry needling;Scar mobilization;Passive range of motion;Joint Manipulations;Spinal Manipulations;Electrical Stimulation;Patient/family education    PT Next Visit Plan  ROM, strengthening, stretching    PT Home Exercise Plan  [email or web address]    Consulted and Agree with Plan of Care  Patient       Patient will benefit from skilled therapeutic intervention in order to improve the following deficits and impairments:  Abnormal gait, Decreased balance, Decreased endurance, Decreased mobility, Difficulty walking, Hypomobility, Pain, Impaired flexibility, Decreased strength, Decreased activity tolerance, Decreased range of motion, Impaired perceived functional ability, Decreased skin integrity  Visit Diagnosis: Acute pain of right knee  Muscle weakness (generalized)  Unsteadiness on feet  Difficulty in walking, not elsewhere classified     Problem List Patient Active Problem List   Diagnosis Date Noted  . Primary localized osteoarthritis of right knee 02/07/2019  . Status post right partial knee replacement 02/07/2019  . Neck abscess 12/23/2018  . Pre-op evaluation 12/23/2018  . Abscess 12/12/2018  . Vagina itching 07/14/2018  .  Ear discharge, bilateral 07/14/2018  . Weight gain 07/14/2018  . Class 1 obesity due to excess calories without serious comorbidity with body mass index (BMI) of 33.0 to 33.9 in adult 05/13/2018  . Prediabetes 12/03/2017  . Snoring 02/27/2016  . Non-restorative sleep 02/27/2016  . Vitamin D deficiency 10/28/2015  . Generalized anxiety  disorder 11/25/2012  . HYPERTRIGLYCERIDEMIA 12/20/2008  . MENOPAUSE, EARLY 10/04/2008  . Fibromyalgia 10/04/2008    Everlean Alstrom. Graylon Good, PT, DPT 03/07/19, 5:55 PM  [facility] [address] [place], [address] Phone: [phone]   Fax:  [phone]  Name: Jillian [name] MRN: [id number] Date of Birth: [birth year]

## 2019-03-09 ENCOUNTER — Ambulatory Visit: Payer: PRIVATE HEALTH INSURANCE | Admitting: Physical Therapy

## 2019-03-09 ENCOUNTER — Telehealth: Payer: Self-pay | Admitting: Physical Therapy

## 2019-03-09 DIAGNOSIS — M1712 Unilateral primary osteoarthritis, left knee: Secondary | ICD-10-CM | POA: Diagnosis present

## 2019-03-09 NOTE — Telephone Encounter (Signed)
Patient called and reported she saw her referring physician who imaged her L knee and said it was bone on bone and so she is planning to have a medial unilateral compartment knee arthrosplasty on the L side on 01/19/2019. She would like to discontinue physical therapy at this point for the R knee because she has met her ROM goal. I discussed with her the importance of keeping strength and motion up even ahead of surgery and she agreed to continue performing her HEP. She has an order for more PT following her L knee surgery, and agreed to bring it in to get scheduled for PT on the left. She also reports she is having a bad fibromyalgia flair and cannot come in for her appointment today.   Updated front office staff.   Everlean Alstrom. Graylon Good, PT, DPT 03/09/19, 2:12 PM

## 2019-03-10 ENCOUNTER — Other Ambulatory Visit: Payer: Self-pay

## 2019-03-10 ENCOUNTER — Ambulatory Visit (INDEPENDENT_AMBULATORY_CARE_PROVIDER_SITE_OTHER): Payer: PRIVATE HEALTH INSURANCE | Admitting: Family Medicine

## 2019-03-10 ENCOUNTER — Encounter: Payer: Self-pay | Admitting: Family Medicine

## 2019-03-10 VITALS — BP 148/84 | HR 93 | Temp 98.3°F | Ht 68.0 in | Wt 232.2 lb

## 2019-03-10 DIAGNOSIS — E559 Vitamin D deficiency, unspecified: Secondary | ICD-10-CM

## 2019-03-10 DIAGNOSIS — Z0000 Encounter for general adult medical examination without abnormal findings: Secondary | ICD-10-CM

## 2019-03-10 DIAGNOSIS — R7303 Prediabetes: Secondary | ICD-10-CM

## 2019-03-10 DIAGNOSIS — G4733 Obstructive sleep apnea (adult) (pediatric): Secondary | ICD-10-CM | POA: Diagnosis not present

## 2019-03-10 DIAGNOSIS — M797 Fibromyalgia: Secondary | ICD-10-CM

## 2019-03-10 NOTE — Progress Notes (Signed)
Chief Complaint  Patient presents with  . Annual Exam    History of Present Illness: HPI  The patient is here for annual wellness exam and preventative care.    01/2019 Dr. Mardelle Matte right partial knee replacement. ROM is improved and pain moderately controlled. Pending surgery in left knee in 02/2019  Prediabetes  Lab Results  Component Value Date   HGBA1C 5.6 03/03/2019    GAD/MDD moderate control on cymbalta and bupropion.  Vit D def:  Low.  OSA: using CPAP. Followed by Dr. Leonidas Romberg.    Low HDL given limited exercsie with knee. Eating moderately healthy.  Body mass index is 35.31 kg/m.  Wt Readings from Last 3 Encounters:  03/10/19 232 lb 4 oz (105.3 kg)  02/07/19 228 lb 6.3 oz (103.6 kg)  01/30/19 232 lb 8 oz (105.5 kg)    Fibromyalgia, inadequately  controlled on cymbalta, wellbutrin, lyrica (three times a day),  Robaxin, tramadol (needed three times a day) and vitamin supplements.  Turmeric. Pt tearful about the pain.  This visit occurred during the SARS-CoV-2 public health emergency.  Safety protocols were in place, including screening questions prior to the visit, additional usage of staff PPE, and extensive cleaning of exam room while observing appropriate contact time as indicated for disinfecting solutions.   COVID 19 screen:  No recent travel or known exposure to COVID19 The patient denies respiratory symptoms of COVID 19 at this time. The importance of social distancing was discussed today.     ROS    Past Medical History:  Diagnosis Date  . Cancer ([place])    basal cell 2016  . Fibromyalgia   . Pre-diabetes   . Primary localized osteoarthritis of right knee 02/07/2019  . Sleep apnea    uses CPAP    reports that she quit smoking about 26 years ago. Her smoking use included cigarettes. She has a 15.00 pack-year smoking history. She has never used smokeless tobacco. She reports that she does not drink alcohol or use drugs.   Current Outpatient  Medications:  .  aspirin EC 325 MG tablet, Take 1 tablet (325 mg total) by mouth 2 (two) times daily., Disp: 60 tablet, Rfl: 0 .  buPROPion (WELLBUTRIN XL) 150 MG 24 hr tablet, TAKE 1 TABLET(150 MG) BY MOUTH DAILY, Disp: 90 tablet, Rfl: 1 .  cholecalciferol (VITAMIN D) 400 units TABS tablet, Take 400 Units by mouth daily. , Disp: , Rfl:  .  Coenzyme Q10 (CO Q 10) 100 MG CAPS, Take 100 mg by mouth daily. , Disp: , Rfl:  .  diclofenac Sodium (VOLTAREN) 1 % GEL, Apply 2 g topically 3 (three) times daily as needed (pain)., Disp: , Rfl:  .  DULoxetine (CYMBALTA) 30 MG capsule, Take 3 capsules (90 mg total) by mouth daily., Disp: 270 capsule, Rfl: 1 .  fish oil-omega-3 fatty acids 1000 MG capsule, Take 4,000 mg by mouth daily. , Disp: , Rfl:  .  Magnesium Malate 1250 (141.7 Mg) MG TABS, Take 141.7-283.4 mg by mouth See admin instructions. Take 141.7mg  by mouth every morning and take 283.4mg  every evening, Disp: , Rfl:  .  methocarbamol (ROBAXIN) 500 MG tablet, Take 1 tablet (500 mg total) by mouth 3 (three) times daily as needed for muscle spasms., Disp: 90 tablet, Rfl: 0 .  pregabalin (LYRICA) 150 MG capsule, Take 1 capsule (150 mg total) by mouth 3 (three) times daily., Disp: 90 capsule, Rfl: 1 .  traMADol (ULTRAM) 50 MG tablet, TAKE 2 TABLETS BY MOUTH THREE  TIMES DAILY (Patient taking differently: Take 100 mg by mouth 3 (three) times daily. ), Disp: 180 tablet, Rfl: 0   Observations/Objective: Blood pressure (!) 148/84, pulse 93, temperature 98.3 F (36.8 C), temperature source Temporal, height 5\' 8"  (1.727 m), weight 232 lb 4 oz (105.3 kg), SpO2 93 %.   BP Readings from Last 3 Encounters:  03/10/19 (!) 148/84  02/08/19 115/62  01/30/19 138/76    Physical Exam   Assessment and Plan The patient's preventative maintenance and recommended screening tests for an annual wellness exam were reviewed in full today. Brought up to date unless services declined.  Counselled on the importance of diet,  exercise, and its role in overall health and mortality. The patient's FH and SH was reviewed, including their home life, tobacco status, and drug and alcohol status.   Vaccines: Uptodateshingles, TDap. Got At work 12/30/2018 Mammo: plans every 2 years..no family history, no personal changes or fibrocystic disease. Last mammo11/ 2018 DUE DVE/pap:nml pap/dve nml, neg co-testing 2017,repeat in 5 years. Colon:Father with colon cancer age 79.. Colonoscopy 12/26/2010: no polyps 09/2016 Incomplete prep... Repeated 09/2017 Dr. Vira Agar Nonsmoker  Bone density: went through early menopause but no other risk factors. Begin screening at age 37.  Nonsmoker Hep C: neg   Prediabetes  stable control Encouraged exercise, weight loss, healthy eating habits.   OSA (obstructive sleep apnea) Now on CPAP. Followed by pulmonary.  Vitamin D deficiency Replete.  Fibromyalgia Not ideally controlled despite current regimen. Pt not interested in med change at this time.  Encouraged getting back to regular exercise.     Eliezer Lofts, MD

## 2019-03-10 NOTE — Patient Instructions (Addendum)
When able restart vit D daily 1000 mg.  Get back to regualr exercise.

## 2019-03-13 NOTE — Progress Notes (Addendum)
PCP - Eliezer Lofts  Cardiologist -   Chest x-ray -   EKG - 12-23-18   HGA1C 03-03-19  5.6  Stress Test -  ECHO -  Cardiac Cath -   Sleep Study -  CPAP -   Fasting Blood Sugar -  Checks Blood Sugar _____ times a day  Blood Thinner Instructions: Aspirin Instructions: Last Dose:  Anesthesia review:   Patient denies shortness of breath, fever, cough and chest pain at PAT appointment   Patient verbalized understanding of instructions that were given to them at the PAT appointment. Patient was also instructed that they will need to review over the PAT instructions again at home before surgery.

## 2019-03-13 NOTE — Patient Instructions (Addendum)
DUE TO COVID-19 ONLY ONE VISITOR IS ALLOWED TO COME WITH YOU AND STAY IN THE WAITING ROOM ONLY DURING PRE OP AND PROCEDURE DAY OF SURGERY. THE 1 VISITOR MAY VISIT WITH YOU AFTER SURGERY IN YOUR PRIVATE ROOM DURING VISITING HOURS ONLY!  YOU NEED TO HAVE A COVID 19 TEST ON 03-17-19 @ 1:05 PM, THIS TEST MUST BE DONE BEFORE SURGERY, COME  [place], [place] , [address].  ([place]) ONCE YOUR COVID TEST IS COMPLETED, PLEASE BEGIN THE QUARANTINE INSTRUCTIONS AS OUTLINED IN YOUR HANDOUT.                Jillian [name]  03/13/2019   Your procedure is scheduled on: 03-21-19    Report to [facility] Main  Entrance    Report to Admitting at 10:35 AM     Call this number if you have problems the morning of surgery [phone]    Remember: NO SOLID FOOD AFTER MIDNIGHT THE NIGHT PRIOR TO SURGERY. NOTHING BY MOUTH EXCEPT CLEAR LIQUIDS UNTIL10:05 AM . PLEASE FINISH G2 DRINK PER SURGEON ORDER  WHICH NEEDS TO BE COMPLETED AT 10:05 AM .   CLEAR LIQUID DIET   Foods Allowed                                                                     Foods Excluded  Coffee and tea, regular and decaf                             liquids that you cannot  Plain Jell-O any favor except red or purple                                           see through such as: Fruit ices (not with fruit pulp)                                     milk, soups, orange juice  Iced Popsicles                                    All solid food Carbonated beverages, regular and diet                                    Cranberry, grape and apple juices Sports drinks like Gatorade Lightly seasoned clear broth or consume(fat free) Sugar, honey syrup   _____________________________________________________________________       Take these medicines the morning of surgery with A SIP OF WATER: Bupropion (Wellbutrin XL), Duloxetine (Cymbalta), and Tramadol (Ultram)  BRUSH YOUR TEETH MORNING OF SURGERY AND  RINSE YOUR MOUTH OUT, NO CHEWING GUM CANDY OR MINTS.                                You may not  have any metal on your body including hair pins and              piercings     Do not wear jewelry, make-up, lotions, powders or perfumes, deodorant              Do not wear nail polish on your fingernails.  Do not shave  48 hours prior to surgery.              Do not bring valuables to the hospital. [place].  Contacts, dentures or bridgework may not be worn into surgery.      Patients discharged the day of surgery will not be allowed to drive home. IF YOU ARE HAVING SURGERY AND GOING HOME THE SAME DAY, YOU MUST HAVE AN ADULT TO DRIVE YOU HOME AND BE WITH YOU FOR 24 HOURS. YOU MAY GO HOME BY TAXI OR UBER OR ORTHERWISE, BUT AN ADULT MUST ACCOMPANY YOU HOME AND STAY WITH YOU FOR 24 HOURS.    Name and phone number of your driver: Jillian [name] [phone]                Please read over the following fact sheets you were given: _____________________________________________________________________             [facility] - Preparing for Surgery Before surgery, you can play an important role.  Because skin is not sterile, your skin needs to be as free of germs as possible.  You can reduce the number of germs on your skin by washing with CHG (chlorahexidine gluconate) soap before surgery.  CHG is an antiseptic cleaner which kills germs and bonds with the skin to continue killing germs even after washing. Please DO NOT use if you have an allergy to CHG or antibacterial soaps.  If your skin becomes reddened/irritated stop using the CHG and inform your nurse when you arrive at Short Stay. Do not shave (including legs and underarms) for at least 48 hours prior to the first CHG shower.  You may shave your face/neck. Please follow these instructions carefully:  1.  Shower with CHG Soap the night before surgery and the  morning of Surgery.  2.  If  you choose to wash your hair, wash your hair first as usual with your  normal  shampoo.  3.  After you shampoo, rinse your hair and body thoroughly to remove the  shampoo.                           4.  Use CHG as you would any other liquid soap.  You can apply chg directly  to the skin and wash                       Gently with a scrungie or clean washcloth.  5.  Apply the CHG Soap to your body ONLY FROM THE NECK DOWN.   Do not use on face/ open                           Wound or open sores. Avoid contact with eyes, ears mouth and genitals (private parts).                       Wash face,  Genitals (  private parts) with your normal soap.             6.  Wash thoroughly, paying special attention to the area where your surgery  will be performed.  7.  Thoroughly rinse your body with warm water from the neck down.  8.  DO NOT shower/wash with your normal soap after using and rinsing off  the CHG Soap.                9.  Pat yourself dry with a clean towel.            10.  Wear clean pajamas.            11.  Place clean sheets on your bed the night of your first shower and do not  sleep with pets. Day of Surgery : Do not apply any lotions/deodorants the morning of surgery.  Please wear clean clothes to the hospital/surgery center.  FAILURE TO FOLLOW THESE INSTRUCTIONS MAY RESULT IN THE CANCELLATION OF YOUR SURGERY PATIENT SIGNATURE_________________________________  NURSE SIGNATURE__________________________________  ________________________________________________________________________   Jillian [name]  An incentive spirometer is a tool that can help keep your lungs clear and active. This tool measures how well you are filling your lungs with each breath. Taking long deep breaths may help reverse or decrease the chance of developing breathing (pulmonary) problems (especially infection) following:  A long period of time when you are unable to move or be active. BEFORE THE PROCEDURE    If the spirometer includes an indicator to show your best effort, your nurse or respiratory therapist will set it to a desired goal.  If possible, sit up straight or lean slightly forward. Try not to slouch.  Hold the incentive spirometer in an upright position. INSTRUCTIONS FOR USE  1. Sit on the edge of your bed if possible, or sit up as far as you can in bed or on a chair. 2. Hold the incentive spirometer in an upright position. 3. Breathe out normally. 4. Place the mouthpiece in your mouth and seal your lips tightly around it. 5. Breathe in slowly and as deeply as possible, raising the piston or the ball toward the top of the column. 6. Hold your breath for 3-5 seconds or for as long as possible. Allow the piston or ball to fall to the bottom of the column. 7. Remove the mouthpiece from your mouth and breathe out normally. 8. Rest for a few seconds and repeat Steps 1 through 7 at least 10 times every 1-2 hours when you are awake. Take your time and take a few normal breaths between deep breaths. 9. The spirometer may include an indicator to show your best effort. Use the indicator as a goal to work toward during each repetition. 10. After each set of 10 deep breaths, practice coughing to be sure your lungs are clear. If you have an incision (the cut made at the time of surgery), support your incision when coughing by placing a pillow or rolled up towels firmly against it. Once you are able to get out of bed, walk around indoors and cough well. You may stop using the incentive spirometer when instructed by your caregiver.  RISKS AND COMPLICATIONS  Take your time so you do not get dizzy or light-headed.  If you are in pain, you may need to take or ask for pain medication before doing incentive spirometry. It is harder to take a deep breath if you are having pain. AFTER USE  Rest  and breathe slowly and easily.  It can be helpful to keep track of a log of your progress. Your caregiver  can provide you with a simple table to help with this. If you are using the spirometer at home, follow these instructions: [place] IF:   You are having difficultly using the spirometer.  You have trouble using the spirometer as often as instructed.  Your pain medication is not giving enough relief while using the spirometer.  You develop fever of 100.5 F (38.1 C) or higher. SEEK IMMEDIATE MEDICAL CARE IF:   You cough up bloody sputum that had not been present before.  You develop fever of 102 F (38.9 C) or greater.  You develop worsening pain at or near the incision site. MAKE SURE YOU:   Understand these instructions.  Will watch your condition.  Will get help right away if you are not doing well or get worse. Document Released: 07/27/2006 Document Revised: 06/08/2011 Document Reviewed: 09/27/2006 [facility] Patient Information 2014 [employer], [place].   ________________________________________________________________________

## 2019-03-14 ENCOUNTER — Encounter: Payer: PRIVATE HEALTH INSURANCE | Admitting: Physical Therapy

## 2019-03-15 ENCOUNTER — Encounter (HOSPITAL_COMMUNITY)
Admission: RE | Admit: 2019-03-15 | Discharge: 2019-03-15 | Disposition: A | Discharge status: Home / Self Care | Payer: PRIVATE HEALTH INSURANCE | Source: Ambulatory Visit | Attending: Orthopedic Surgery | Admitting: Orthopedic Surgery

## 2019-03-15 ENCOUNTER — Other Ambulatory Visit: Payer: Self-pay

## 2019-03-15 ENCOUNTER — Encounter (HOSPITAL_COMMUNITY): Payer: Self-pay

## 2019-03-15 DIAGNOSIS — Z01812 Encounter for preprocedural laboratory examination: Secondary | ICD-10-CM | POA: Diagnosis present

## 2019-03-15 DIAGNOSIS — M1712 Unilateral primary osteoarthritis, left knee: Secondary | ICD-10-CM | POA: Insufficient documentation

## 2019-03-15 LAB — BASIC METABOLIC PANEL
Anion gap: 5 (ref 5–15)
BUN: 15 mg/dL (ref 8–23)
CO2: 27 mmol/L (ref 22–32)
Calcium: 9.5 mg/dL (ref 8.9–10.3)
Chloride: 105 mmol/L (ref 98–111)
Creatinine, Ser: 0.88 mg/dL (ref 0.44–1.00)
GFR calc Af Amer: >60 mL/min (ref >60)
GFR calc non Af Amer: >60 mL/min (ref >60)
Glucose, Bld: 109 mg/dL — ABNORMAL HIGH (ref 70–99)
Potassium: 5.2 mmol/L — ABNORMAL HIGH (ref 3.5–5.1)
Sodium: 137 mmol/L (ref 135–145)

## 2019-03-15 LAB — CBC
HCT: 40.4 % (ref 36.0–46.0)
Hemoglobin: 13.2 g/dL (ref 12.0–15.0)
MCH: 31.1 pg (ref 26.0–34.0)
MCHC: 32.7 g/dL (ref 30.0–36.0)
MCV: 95.1 fL (ref 80.0–100.0)
Platelets: 243 10*3/uL (ref 150–400)
RBC: 4.25 MIL/uL (ref 3.87–5.11)
RDW: 12.3 % (ref 11.5–15.5)
WBC: 6.1 10*3/uL (ref 4.0–10.5)
nRBC: 0 % (ref 0.0–0.2)

## 2019-03-15 LAB — GLUCOSE, CAPILLARY: Glucose-Capillary: 118 mg/dL — ABNORMAL HIGH (ref 70–99)

## 2019-03-15 LAB — SURGICAL PCR SCREEN
MRSA, PCR: NEGATIVE
Staphylococcus aureus: POSITIVE — AB

## 2019-03-15 NOTE — Progress Notes (Signed)
PCR results routed to Dr. Luanna Cole office for review.

## 2019-03-16 ENCOUNTER — Encounter: Payer: PRIVATE HEALTH INSURANCE | Admitting: Physical Therapy

## 2019-03-16 NOTE — Care Plan (Signed)
Spoke with patient prior to surgery. She is planning to discharge to home, hopefully same day, with family and go to Outpatient physical therapy.  This has been scheduled at [place] at [facility] for 03/27/19.  Patient and MD are in agreement with plan and Choice was offered.    Ladell Heads, [place]

## 2019-03-17 ENCOUNTER — Other Ambulatory Visit (HOSPITAL_COMMUNITY)
Admission: RE | Admit: 2019-03-17 | Discharge: 2019-03-17 | Disposition: A | Discharge status: Home / Self Care | Payer: PRIVATE HEALTH INSURANCE | Source: Ambulatory Visit | Attending: Orthopedic Surgery | Admitting: Orthopedic Surgery

## 2019-03-17 DIAGNOSIS — Z20828 Contact with and (suspected) exposure to other viral communicable diseases: Secondary | ICD-10-CM | POA: Insufficient documentation

## 2019-03-17 DIAGNOSIS — Z01812 Encounter for preprocedural laboratory examination: Secondary | ICD-10-CM | POA: Insufficient documentation

## 2019-03-18 LAB — NOVEL CORONAVIRUS, NAA (HOSP ORDER, SEND-OUT TO REF LAB; TAT 18-24 HRS): SARS-CoV-2, NAA: NOT DETECTED

## 2019-03-20 NOTE — Progress Notes (Signed)
Pt's surgery time has changed from 1:05 PM to 10:30AM. Pt advised to report to Short Stay at 8:00 AM, and to remain on Clear Liquid Diet from Midnight until 7:30 AM, when G2 drink is consumed. Pt verbalized understanding.

## 2019-03-21 ENCOUNTER — Encounter: Payer: PRIVATE HEALTH INSURANCE | Admitting: Physical Therapy

## 2019-03-21 ENCOUNTER — Ambulatory Visit (HOSPITAL_COMMUNITY)
Admission: RE | Admit: 2019-03-21 | Discharge: 2019-03-21 | Disposition: A | Discharge status: Home / Self Care | Payer: PRIVATE HEALTH INSURANCE | Attending: Orthopedic Surgery | Admitting: Orthopedic Surgery

## 2019-03-21 ENCOUNTER — Encounter (HOSPITAL_COMMUNITY): Payer: Self-pay | Admitting: Orthopedic Surgery

## 2019-03-21 ENCOUNTER — Ambulatory Visit (HOSPITAL_COMMUNITY): Payer: PRIVATE HEALTH INSURANCE | Admitting: Physician Assistant

## 2019-03-21 ENCOUNTER — Encounter (HOSPITAL_COMMUNITY)
Admission: RE | Disposition: A | Discharge status: Home / Self Care | Payer: Self-pay | Source: Home / Self Care | Attending: Orthopedic Surgery

## 2019-03-21 ENCOUNTER — Ambulatory Visit (HOSPITAL_COMMUNITY): Payer: PRIVATE HEALTH INSURANCE

## 2019-03-21 ENCOUNTER — Other Ambulatory Visit: Payer: Self-pay

## 2019-03-21 ENCOUNTER — Ambulatory Visit (HOSPITAL_COMMUNITY): Payer: PRIVATE HEALTH INSURANCE | Admitting: Certified Registered"

## 2019-03-21 DIAGNOSIS — Z96652 Presence of left artificial knee joint: Secondary | ICD-10-CM

## 2019-03-21 DIAGNOSIS — Z85828 Personal history of other malignant neoplasm of skin: Secondary | ICD-10-CM | POA: Insufficient documentation

## 2019-03-21 DIAGNOSIS — Z79899 Other long term (current) drug therapy: Secondary | ICD-10-CM | POA: Diagnosis not present

## 2019-03-21 DIAGNOSIS — Z96651 Presence of right artificial knee joint: Secondary | ICD-10-CM | POA: Insufficient documentation

## 2019-03-21 DIAGNOSIS — Z833 Family history of diabetes mellitus: Secondary | ICD-10-CM | POA: Insufficient documentation

## 2019-03-21 DIAGNOSIS — Z8249 Family history of ischemic heart disease and other diseases of the circulatory system: Secondary | ICD-10-CM | POA: Diagnosis not present

## 2019-03-21 DIAGNOSIS — Z791 Long term (current) use of non-steroidal anti-inflammatories (NSAID): Secondary | ICD-10-CM | POA: Diagnosis not present

## 2019-03-21 DIAGNOSIS — Z87891 Personal history of nicotine dependence: Secondary | ICD-10-CM | POA: Insufficient documentation

## 2019-03-21 DIAGNOSIS — M1712 Unilateral primary osteoarthritis, left knee: Secondary | ICD-10-CM | POA: Diagnosis not present

## 2019-03-21 DIAGNOSIS — F419 Anxiety disorder, unspecified: Secondary | ICD-10-CM | POA: Diagnosis not present

## 2019-03-21 DIAGNOSIS — M797 Fibromyalgia: Secondary | ICD-10-CM | POA: Diagnosis not present

## 2019-03-21 DIAGNOSIS — Z7982 Long term (current) use of aspirin: Secondary | ICD-10-CM | POA: Insufficient documentation

## 2019-03-21 DIAGNOSIS — G4730 Sleep apnea, unspecified: Secondary | ICD-10-CM | POA: Insufficient documentation

## 2019-03-21 DIAGNOSIS — Z800 Family history of malignant neoplasm of digestive organs: Secondary | ICD-10-CM | POA: Diagnosis not present

## 2019-03-21 DIAGNOSIS — Z6835 Body mass index (BMI) 35.0-35.9, adult: Secondary | ICD-10-CM | POA: Diagnosis not present

## 2019-03-21 DIAGNOSIS — M1990 Unspecified osteoarthritis, unspecified site: Secondary | ICD-10-CM | POA: Diagnosis not present

## 2019-03-21 HISTORY — PX: PARTIAL KNEE ARTHROPLASTY: SHX2174

## 2019-03-21 SURGERY — ARTHROPLASTY, KNEE, UNICOMPARTMENTAL
Anesthesia: Spinal | Site: Knee | Laterality: Left

## 2019-03-21 MED ORDER — FENTANYL CITRATE (PF) 100 MCG/2ML IJ SOLN
50.0000 ug | INTRAMUSCULAR | Status: DC
  Administered 2019-03-21: 50 ug via INTRAVENOUS
  Filled 2019-03-21: qty 2

## 2019-03-21 MED ORDER — PROPOFOL 500 MG/50ML IV EMUL
INTRAVENOUS | Status: DC | PRN
  Administered 2019-03-21: 75 ug/kg/min via INTRAVENOUS

## 2019-03-21 MED ORDER — ONDANSETRON HCL 4 MG/2ML IJ SOLN
INTRAMUSCULAR | Status: AC
  Filled 2019-03-21: qty 2

## 2019-03-21 MED ORDER — LACTATED RINGERS IV BOLUS
250.0000 mL | Freq: Once | INTRAVENOUS | Status: AC
  Administered 2019-03-21: 15:00:00 250 mL via INTRAVENOUS

## 2019-03-21 MED ORDER — LACTATED RINGERS IV SOLN: INTRAVENOUS | Status: DC

## 2019-03-21 MED ORDER — METOCLOPRAMIDE HCL 5 MG/ML IJ SOLN: 5.0000 mg | Freq: Three times a day (TID) | INTRAMUSCULAR | Status: DC | PRN

## 2019-03-21 MED ORDER — ASPIRIN EC 325 MG PO TBEC: 325.0000 mg | DELAYED_RELEASE_TABLET | Freq: Two times a day (BID) | ORAL | 0 refills | Status: DC

## 2019-03-21 MED ORDER — PHENOL 1.4 % MT LIQD: 1.0000 | OROMUCOSAL | Status: DC | PRN

## 2019-03-21 MED ORDER — ACETAMINOPHEN 325 MG PO TABS: 325.0000 mg | ORAL_TABLET | Freq: Four times a day (QID) | ORAL | Status: DC | PRN

## 2019-03-21 MED ORDER — LACTATED RINGERS IV BOLUS
500.0000 mL | Freq: Once | INTRAVENOUS | Status: AC
  Administered 2019-03-21: 500 mL via INTRAVENOUS

## 2019-03-21 MED ORDER — METHOCARBAMOL 500 MG IVPB - SIMPLE MED: 500.0000 mg | Freq: Four times a day (QID) | INTRAVENOUS | Status: DC | PRN

## 2019-03-21 MED ORDER — CHLORHEXIDINE GLUCONATE 4 % EX LIQD: 60.0000 mL | Freq: Once | CUTANEOUS | Status: DC

## 2019-03-21 MED ORDER — SODIUM CHLORIDE 0.9 % IR SOLN
Status: DC | PRN
  Administered 2019-03-21: 1000 mL

## 2019-03-21 MED ORDER — DULOXETINE HCL 60 MG PO CPEP: 90.0000 mg | ORAL_CAPSULE | Freq: Every day | ORAL | Status: DC

## 2019-03-21 MED ORDER — ALUM & MAG HYDROXIDE-SIMETH 200-200-20 MG/5ML PO SUSP: 30.0000 mL | ORAL | Status: DC | PRN

## 2019-03-21 MED ORDER — PROMETHAZINE HCL 25 MG/ML IJ SOLN: 6.2500 mg | INTRAMUSCULAR | Status: DC | PRN

## 2019-03-21 MED ORDER — HYDROCODONE-ACETAMINOPHEN 5-325 MG PO TABS: 1.0000 | ORAL_TABLET | ORAL | Status: DC | PRN

## 2019-03-21 MED ORDER — BUPIVACAINE HCL 0.25 % IJ SOLN
INTRAMUSCULAR | Status: DC | PRN
  Administered 2019-03-21: 30 mL

## 2019-03-21 MED ORDER — MENTHOL 3 MG MT LOZG: 1.0000 | LOZENGE | OROMUCOSAL | Status: DC | PRN

## 2019-03-21 MED ORDER — ACETAMINOPHEN 500 MG PO TABS
1000.0000 mg | ORAL_TABLET | Freq: Once | ORAL | Status: AC
  Administered 2019-03-21: 1000 mg via ORAL
  Filled 2019-03-21: qty 2

## 2019-03-21 MED ORDER — BUPIVACAINE IN DEXTROSE 0.75-8.25 % IT SOLN
INTRATHECAL | Status: DC | PRN
  Administered 2019-03-21: 1.6 mL via INTRATHECAL

## 2019-03-21 MED ORDER — PROPOFOL 10 MG/ML IV BOLUS
INTRAVENOUS | Status: DC | PRN
  Administered 2019-03-21: 30 mg via INTRAVENOUS

## 2019-03-21 MED ORDER — MAGNESIUM CITRATE PO SOLN: 1.0000 | Freq: Once | ORAL | Status: DC | PRN

## 2019-03-21 MED ORDER — HYDROCODONE-ACETAMINOPHEN 7.5-325 MG PO TABS: 1.0000 | ORAL_TABLET | ORAL | Status: DC | PRN

## 2019-03-21 MED ORDER — MIDAZOLAM HCL 2 MG/2ML IJ SOLN
1.0000 mg | INTRAMUSCULAR | Status: DC
  Administered 2019-03-21: 09:00:00 1 mg via INTRAVENOUS
  Filled 2019-03-21: qty 2

## 2019-03-21 MED ORDER — BUPIVACAINE HCL 0.25 % IJ SOLN
INTRAMUSCULAR | Status: AC
  Filled 2019-03-21: qty 1

## 2019-03-21 MED ORDER — MEPERIDINE HCL 50 MG/ML IJ SOLN: 6.2500 mg | INTRAMUSCULAR | Status: DC | PRN

## 2019-03-21 MED ORDER — METOCLOPRAMIDE HCL 5 MG PO TABS: 5.0000 mg | ORAL_TABLET | Freq: Three times a day (TID) | ORAL | Status: DC | PRN

## 2019-03-21 MED ORDER — DIPHENHYDRAMINE HCL 12.5 MG/5ML PO ELIX: 12.5000 mg | ORAL_SOLUTION | ORAL | Status: DC | PRN

## 2019-03-21 MED ORDER — MORPHINE SULFATE (PF) 4 MG/ML IV SOLN: 0.5000 mg | INTRAVENOUS | Status: DC | PRN

## 2019-03-21 MED ORDER — MIDAZOLAM HCL 2 MG/2ML IJ SOLN: 0.5000 mg | Freq: Once | INTRAMUSCULAR | Status: DC | PRN

## 2019-03-21 MED ORDER — PROPOFOL 500 MG/50ML IV EMUL
INTRAVENOUS | Status: AC
  Filled 2019-03-21: qty 50

## 2019-03-21 MED ORDER — TRANEXAMIC ACID-NACL 1000-0.7 MG/100ML-% IV SOLN
INTRAVENOUS | Status: AC
  Filled 2019-03-21: qty 100

## 2019-03-21 MED ORDER — PREGABALIN 75 MG PO CAPS: 150.0000 mg | ORAL_CAPSULE | Freq: Three times a day (TID) | ORAL | Status: DC

## 2019-03-21 MED ORDER — KETOROLAC TROMETHAMINE 30 MG/ML IJ SOLN
INTRAMUSCULAR | Status: DC | PRN
  Administered 2019-03-21: 30 mg via INTRAVENOUS

## 2019-03-21 MED ORDER — POVIDONE-IODINE 10 % EX SWAB
2.0000 "application " | Freq: Once | CUTANEOUS | Status: AC
  Administered 2019-03-21: 2 via TOPICAL

## 2019-03-21 MED ORDER — ONDANSETRON HCL 4 MG PO TABS: 4.0000 mg | ORAL_TABLET | Freq: Three times a day (TID) | ORAL | 0 refills | Status: DC | PRN

## 2019-03-21 MED ORDER — KETOROLAC TROMETHAMINE 15 MG/ML IJ SOLN: 7.5000 mg | Freq: Four times a day (QID) | INTRAMUSCULAR | Status: DC

## 2019-03-21 MED ORDER — KETOROLAC TROMETHAMINE 30 MG/ML IJ SOLN
INTRAMUSCULAR | Status: AC
  Filled 2019-03-21: qty 1

## 2019-03-21 MED ORDER — TRANEXAMIC ACID-NACL 1000-0.7 MG/100ML-% IV SOLN: 1000.0000 mg | Freq: Once | INTRAVENOUS | Status: DC

## 2019-03-21 MED ORDER — ACETAMINOPHEN 500 MG PO TABS: 500.0000 mg | ORAL_TABLET | Freq: Four times a day (QID) | ORAL | Status: DC

## 2019-03-21 MED ORDER — OXYCODONE HCL 5 MG PO TABS: 5.0000 mg | ORAL_TABLET | ORAL | 0 refills | Status: DC | PRN

## 2019-03-21 MED ORDER — METHOCARBAMOL 500 MG PO TABS: 500.0000 mg | ORAL_TABLET | Freq: Four times a day (QID) | ORAL | Status: DC | PRN

## 2019-03-21 MED ORDER — BISACODYL 10 MG RE SUPP: 10.0000 mg | Freq: Every day | RECTAL | Status: DC | PRN

## 2019-03-21 MED ORDER — POTASSIUM CHLORIDE IN NACL 20-0.45 MEQ/L-% IV SOLN: INTRAVENOUS | Status: DC

## 2019-03-21 MED ORDER — ONDANSETRON HCL 4 MG/2ML IJ SOLN
INTRAMUSCULAR | Status: DC | PRN
  Administered 2019-03-21: 4 mg via INTRAVENOUS

## 2019-03-21 MED ORDER — CEFAZOLIN SODIUM-DEXTROSE 2-4 GM/100ML-% IV SOLN: 2.0000 g | Freq: Four times a day (QID) | INTRAVENOUS | Status: DC

## 2019-03-21 MED ORDER — DEXAMETHASONE SODIUM PHOSPHATE 10 MG/ML IJ SOLN: 10.0000 mg | Freq: Once | INTRAMUSCULAR | Status: DC

## 2019-03-21 MED ORDER — HYDROMORPHONE HCL 1 MG/ML IJ SOLN: 0.2500 mg | INTRAMUSCULAR | Status: DC | PRN

## 2019-03-21 MED ORDER — DOCUSATE SODIUM 100 MG PO CAPS: 100.0000 mg | ORAL_CAPSULE | Freq: Two times a day (BID) | ORAL | Status: DC

## 2019-03-21 MED ORDER — TRANEXAMIC ACID-NACL 1000-0.7 MG/100ML-% IV SOLN
1000.0000 mg | INTRAVENOUS | Status: AC
  Administered 2019-03-21: 1000 mg via INTRAVENOUS
  Filled 2019-03-21: qty 100

## 2019-03-21 MED ORDER — ROPIVACAINE HCL 7.5 MG/ML IJ SOLN
INTRAMUSCULAR | Status: DC | PRN
  Administered 2019-03-21: 20 mL via PERINEURAL

## 2019-03-21 MED ORDER — ASPIRIN EC 325 MG PO TBEC: 325.0000 mg | DELAYED_RELEASE_TABLET | Freq: Every day | ORAL | Status: DC

## 2019-03-21 MED ORDER — ZOLPIDEM TARTRATE 5 MG PO TABS: 5.0000 mg | ORAL_TABLET | Freq: Every evening | ORAL | Status: DC | PRN

## 2019-03-21 MED ORDER — ONDANSETRON HCL 4 MG PO TABS: 4.0000 mg | ORAL_TABLET | Freq: Four times a day (QID) | ORAL | Status: DC | PRN

## 2019-03-21 MED ORDER — CEFAZOLIN SODIUM-DEXTROSE 2-4 GM/100ML-% IV SOLN
2.0000 g | INTRAVENOUS | Status: AC
  Administered 2019-03-21: 2 g via INTRAVENOUS
  Filled 2019-03-21: qty 100

## 2019-03-21 MED ORDER — ONDANSETRON HCL 4 MG/2ML IJ SOLN: 4.0000 mg | Freq: Four times a day (QID) | INTRAMUSCULAR | Status: DC | PRN

## 2019-03-21 MED ORDER — SENNA-DOCUSATE SODIUM 8.6-50 MG PO TABS: 2.0000 | ORAL_TABLET | Freq: Every day | ORAL | 1 refills | Status: DC

## 2019-03-21 MED ORDER — DEXAMETHASONE SODIUM PHOSPHATE 10 MG/ML IJ SOLN
INTRAMUSCULAR | Status: DC | PRN
  Administered 2019-03-21: 10 mg via INTRAVENOUS

## 2019-03-21 MED ORDER — MEPIVACAINE HCL (PF) 2 % IJ SOLN
INTRAMUSCULAR | Status: AC
  Filled 2019-03-21: qty 20

## 2019-03-21 MED ORDER — POLYETHYLENE GLYCOL 3350 17 G PO PACK: 17.0000 g | PACK | Freq: Every day | ORAL | Status: DC | PRN

## 2019-03-21 MED ORDER — DEXAMETHASONE SODIUM PHOSPHATE 10 MG/ML IJ SOLN
INTRAMUSCULAR | Status: AC
  Filled 2019-03-21: qty 1

## 2019-03-21 SURGICAL SUPPLY — 70 items
BAG SPEC THK2 15X12 ZIP CLS (MISCELLANEOUS) ×1
BAG ZIPLOCK 12X15 (MISCELLANEOUS) ×2 IMPLANT
BANDAGE ESMARK 6X9 LF (GAUZE/BANDAGES/DRESSINGS) ×1 IMPLANT
BEARING MENISCAL TIBIAL 6 MD L (Orthopedic Implant) ×1 IMPLANT
BLADE SURG 15 STRL LF DISP TIS (BLADE) ×1 IMPLANT
BLADE SURG 15 STRL SS (BLADE) ×2
BNDG CMPR 9X6 STRL LF SNTH (GAUZE/BANDAGES/DRESSINGS) ×1
BNDG CMPR MED 15X6 ELC VLCR LF (GAUZE/BANDAGES/DRESSINGS) ×1
BNDG ELASTIC 6X15 VLCR STRL LF (GAUZE/BANDAGES/DRESSINGS) ×2 IMPLANT
BNDG ESMARK 6X9 LF (GAUZE/BANDAGES/DRESSINGS) ×2
BOWL SMART MIX CTS (DISPOSABLE) ×2 IMPLANT
BRNG TIB B UNCMP STRL LM/RL (Joint) ×1 IMPLANT
BRNG TIB MED 6 PHS 3 LT MEN (Orthopedic Implant) ×1 IMPLANT
CEMENT BONE R 1X40 (Cement) ×2 IMPLANT
CLSR STERI-STRIP ANTIMIC 1/2X4 (GAUZE/BANDAGES/DRESSINGS) ×2 IMPLANT
COVER SURGICAL LIGHT HANDLE (MISCELLANEOUS) ×2 IMPLANT
COVER WAND RF STERILE (DRAPES) IMPLANT
CUFF TOURN SGL QUICK 34 (TOURNIQUET CUFF) ×2
CUFF TRNQT CYL 34X4.125X (TOURNIQUET CUFF) ×1 IMPLANT
DECANTER SPIKE VIAL GLASS SM (MISCELLANEOUS) ×1 IMPLANT
DRAPE EXTREMITY T 121X128X90 (DISPOSABLE) ×2 IMPLANT
DRAPE POUCH INSTRU U-SHP 10X18 (DRAPES) ×2 IMPLANT
DRAPE SHEET LG 3/4 BI-LAMINATE (DRAPES) ×2 IMPLANT
DRAPE U-SHAPE 47X51 STRL (DRAPES) ×2 IMPLANT
DRSG MEPILEX BORDER 4X8 (GAUZE/BANDAGES/DRESSINGS) ×2 IMPLANT
DRSG PAD ABDOMINAL 8X10 ST (GAUZE/BANDAGES/DRESSINGS) ×2 IMPLANT
DURAPREP 26ML APPLICATOR (WOUND CARE) ×4 IMPLANT
ELECT REM PT RETURN 15FT ADLT (MISCELLANEOUS) ×2 IMPLANT
FACESHIELD WRAPAROUND (MASK) ×2 IMPLANT
FACESHIELD WRAPAROUND OR TEAM (MASK) ×1 IMPLANT
GLOVE BIO SURGEON STRL SZ7 (GLOVE) ×2 IMPLANT
GLOVE BIOGEL PI IND STRL 7.0 (GLOVE) ×1 IMPLANT
GLOVE BIOGEL PI IND STRL 8 (GLOVE) ×1 IMPLANT
GLOVE BIOGEL PI INDICATOR 7.0 (GLOVE) ×1
GLOVE BIOGEL PI INDICATOR 8 (GLOVE) ×1
GLOVE SURG SS PI 7.5 STRL IVOR (GLOVE) ×2 IMPLANT
GOWN STRL REUS W/TWL LRG LVL3 (GOWN DISPOSABLE) ×4 IMPLANT
HANDPIECE INTERPULSE COAX TIP (DISPOSABLE) ×2
HOLDER FOLEY CATH W/STRAP (MISCELLANEOUS) ×1 IMPLANT
HOOD PEEL AWAY FLYTE STAYCOOL (MISCELLANEOUS) ×4 IMPLANT
IMMOBILIZER KNEE 20 (SOFTGOODS) ×2
IMMOBILIZER KNEE 20 THIGH 36 (SOFTGOODS) IMPLANT
IMMOBILIZER KNEE 22 UNIV (SOFTGOODS) IMPLANT
INSERT TIBIAL OXFORD SZ B LF (Joint) ×1 IMPLANT
KIT BASIN OR (CUSTOM PROCEDURE TRAY) ×2 IMPLANT
KIT TURNOVER KIT A (KITS) IMPLANT
NDL SAFETY ECLIPSE 18X1.5 (NEEDLE) ×1 IMPLANT
NEEDLE HYPO 18GX1.5 SHARP (NEEDLE) ×4
NS IRRIG 1000ML POUR BTL (IV SOLUTION) ×2 IMPLANT
PACK BLADE SAW RECIP 70 3 PT (BLADE) ×1 IMPLANT
PACK ICE MAXI GEL EZY WRAP (MISCELLANEOUS) ×2 IMPLANT
PACK TOTAL JOINT (CUSTOM PROCEDURE TRAY) ×2 IMPLANT
PEG TWIN FEM CEMENTED MED (Knees) ×1 IMPLANT
PENCIL SMOKE EVACUATOR (MISCELLANEOUS) IMPLANT
PROTECTOR NERVE ULNAR (MISCELLANEOUS) ×2 IMPLANT
SET HNDPC FAN SPRY TIP SCT (DISPOSABLE) ×1 IMPLANT
SUCTION FRAZIER HANDLE 12FR (TUBING) ×1
SUCTION TUBE FRAZIER 12FR DISP (TUBING) ×1 IMPLANT
SUT VIC AB 1 CT1 36 (SUTURE) ×2 IMPLANT
SUT VIC AB 2-0 CT1 27 (SUTURE) ×2
SUT VIC AB 2-0 CT1 TAPERPNT 27 (SUTURE) ×1 IMPLANT
SUT VIC AB 3-0 SH 8-18 (SUTURE) ×2 IMPLANT
SYR 30ML LL (SYRINGE) ×2 IMPLANT
SYR 3ML LL SCALE MARK (SYRINGE) ×2 IMPLANT
TOWEL OR 17X26 10 PK STRL BLUE (TOWEL DISPOSABLE) ×2 IMPLANT
TOWEL OR NON WOVEN STRL DISP B (DISPOSABLE) ×2 IMPLANT
TRAY FOLEY MTR SLVR 14FR STAT (SET/KITS/TRAYS/PACK) ×1 IMPLANT
TRAY FOLEY MTR SLVR 16FR STAT (SET/KITS/TRAYS/PACK) ×1 IMPLANT
WATER STERILE IRR 1000ML POUR (IV SOLUTION) ×3 IMPLANT
WRAP KNEE MAXI GEL POST OP (GAUZE/BANDAGES/DRESSINGS) ×1 IMPLANT

## 2019-03-21 NOTE — Anesthesia Procedure Notes (Signed)
Anesthesia Regional Block: Adductor canal block   Pre-Anesthetic Checklist: ,, timeout performed, Correct Patient, Correct Site, Correct Laterality, Correct Procedure, Correct Position, site marked, Risks and benefits discussed,  Surgical consent,  Pre-op evaluation,  At surgeon's request and post-op pain management  Laterality: Left and Lower  Prep: chloraprep       Needles:  Injection technique: Single-shot  Needle Type: Echogenic Needle     Needle Length: 9cm  Needle Gauge: 21     Additional Needles:   Procedures:,,,, ultrasound used (permanent image in chart),,,,  Narrative:  Start time: 03/21/2019 8:55 AM End time: 03/21/2019 9:02 AM Injection made incrementally with aspirations every 5 mL.  Performed by: Personally  Anesthesiologist: Annye Asa, MD  Additional Notes: Pt identified in Holding room.  Monitors applied. Working IV access confirmed. Sterile prep L thigh.  #21ga ECHOgenic needle into adductor canal with US guidance.  20cc 0.75% Ropivacaine injected incrementally after negative test dose.  Patient asymptomatic, VSS, no heme aspirated, tolerated well.  Jenita Seashore, MD

## 2019-03-21 NOTE — Op Note (Signed)
03/21/2019  12:43 PM  PATIENT:  Jillian [name]    PRE-OPERATIVE DIAGNOSIS: Left knee primary localized anteromedial osteoarthritis  POST-OPERATIVE DIAGNOSIS:  Same  PROCEDURE: LEFT unicompartmental Knee Arthroplasty  SURGEON:  Johnny Bridge, MD  PHYSICIAN ASSISTANT: Merlene Pulling, PA-C, present and scrubbed throughout the case, critical for completion in a timely fashion, and for retraction, instrumentation, and closure.  ANESTHESIA:   Spinal with regional block and intra-articular injection  ESTIMATED BLOOD LOSS: 100 mL  UNIQUE ASPECTS OF THE CASE: The tibial cut was somewhat deeper than the tibial cut on her contralateral side, I cut off of the 2 mm shim, but nonetheless it was slightly deeper such that I ended up with a 6.  I thoroughly tested her MCL, and it felt intact, it is possible there was a small amount of injury in the posterior aspect, but centrally it had excellent tension, and the polyfelt stable, and the knee had full motion.  There was no instability to valgus testing at the completion of the case.  I simply think the tibial cut may have been slightly deeper than her contralateral side.  I was between a size B and a size C on the tibia, although the size B fit better, and the size C filled up line to line everywhere, and threatened overhanging.  PREOPERATIVE INDICATIONS:  Jillian [name] is a  63 y.o. female with a diagnosis of DJD LEFT KNEE who failed conservative measures and elected for surgical management.    The risks benefits and alternatives were discussed with the patient preoperatively including but not limited to the risks of infection, bleeding, nerve injury, cardiopulmonary complications, blood clots, the need for revision surgery, among others, and the patient was willing to proceed.  OPERATIVE IMPLANTS: Biomet [place] mobile bearing medial compartment arthroplasty femur size medium, tibia size B, bearing size 6.  OPERATIVE FINDINGS: Endstage grade 4 medial  compartment osteoarthritis. No significant changes in the lateral or patellofemoral joint.  The ACL was intact.  OPERATIVE PROCEDURE: The patient was brought to the operating room placed in the supine position. General anesthesia was administered. IV antibiotics were given. The lower extremity was placed in the legholder and prepped and draped in usual sterile fashion.  Time out was performed.  The leg was elevated and exsanguinated and the tourniquet was inflated. Anteromedial incision was performed, and I took care to preserve the MCL. Parapatellar incision was carried out, and the osteophytes were excised, along with the medial meniscus and a small portion of the fat pad.  The extra medullary tibial cutting jig was applied, using the spoon and the 7mm G-Clamp and the 2 mm shim, and I took care to protect the anterior cruciate ligament insertion and the tibial spine. The medial collateral ligament was also protected, and I resected my proximal tibia, matching the anatomic slope.   The proximal tibial bony cut was removed in one piece, and I turned my attention to the femur.  The intramedullary femoral rod was placed using the drill, and then using the appropriate reference, I assembled the femoral jig, setting my posterior cutting block. I resected my posterior femur, used the 0 spigot for the anterior femur, and then measured my gap.   I then used the appropriate mill to match the extension gap to the flexion gap. The second milling was at a 4.  It measured a 6-2.  The gaps were then measured again with the appropriate feeler gauges. Once I had balanced flexion and extension gaps,  I then completed the preparation of the femur.  I milled off the anterior aspect of the distal femur to prevent impingement. I also exposed the tibia, and selected the above-named component, and then used the cutting jig to prepare the keel slot on the tibia. I also used the awl to curette out the bone to complete the  preparation of the keel. The back wall was intact.  I then placed trial components, and it was found to have excellent motion, and appropriate balance.  I then cemented the components into place, cementing the tibia first, removing all excess cement, and then cementing the femur.  All loose cement was removed.  The real polyethylene insert was applied manually, and the knee was taken through functional range of motion, and found to have excellent stability and restoration of joint motion, with excellent balance.  The wounds were irrigated copiously, and the parapatellar tissue closed with Vicryl, followed by Vicryl for the subcutaneous tissue, with routine closure with Steri-Strips and sterile gauze.  The tourniquet was released, and the patient was awakened and returned to PACU in stable and satisfactory condition. There were no complications.

## 2019-03-21 NOTE — Anesthesia Preprocedure Evaluation (Signed)
Anesthesia Evaluation  Patient identified by MRN, date of birth, ID band Patient awake    Reviewed: Allergy & Precautions, NPO status , Patient's Chart, lab work & pertinent test results  History of Anesthesia Complications Negative for: history of anesthetic complications  Airway Mallampati: II  TM Distance: >3 FB Neck ROM: Full    Dental  (+) Dental Advisory Given   Pulmonary sleep apnea and Continuous Positive Airway Pressure Ventilation , former smoker,  03/17/2019 SARS CoV2   breath sounds clear to auscultation       Cardiovascular negative cardio ROS   Rhythm:Regular Rate:Normal     Neuro/Psych Anxiety negative neurological ROS     GI/Hepatic negative GI ROS, Neg liver ROS,   Endo/Other  Morbid obesity  Renal/GU negative Renal ROS     Musculoskeletal  (+) Arthritis , Fibromyalgia -  Abdominal (+) + obese,   Peds  Hematology negative hematology ROS (+)   Anesthesia Other Findings   Reproductive/Obstetrics                             Anesthesia Physical Anesthesia Plan  ASA: III  Anesthesia Plan: Spinal   Post-op Pain Management:  Regional for Post-op pain   Induction:   PONV Risk Score and Plan: 2 and Ondansetron and Dexamethasone  Airway Management Planned: Natural Airway and Simple Face Mask  Additional Equipment:   Intra-op Plan:   Post-operative Plan:   Informed Consent: I have reviewed the patients History and Physical, chart, labs and discussed the procedure including the risks, benefits and alternatives for the proposed anesthesia with the patient or authorized representative who has indicated his/her understanding and acceptance.     Dental advisory given  Plan Discussed with: CRNA and Surgeon  Anesthesia Plan Comments: (Plan routine monitors, SAB with adductor canal block for post op analgesia)        Anesthesia Quick Evaluation

## 2019-03-21 NOTE — Progress Notes (Signed)
Assisted Dr. Carswell Jackson with left, ultrasound guided, adductor canal block. Side rails up, monitors on throughout procedure. See vital signs in flow sheet. Tolerated Procedure well.  

## 2019-03-21 NOTE — Anesthesia Procedure Notes (Signed)
Spinal  Patient location during procedure: OR Start time: 03/21/2019 10:45 AM Staffing Performed: anesthesiologist and resident/CRNA  Resident/CRNA: Trudee Chirino D, CRNA Preanesthetic Checklist Completed: patient identified, IV checked, site marked, risks and benefits discussed, surgical consent, monitors and equipment checked, pre-op evaluation and timeout performed Spinal Block Patient position: sitting Prep: DuraPrep Patient monitoring: heart rate, continuous pulse ox and blood pressure Approach: midline Location: L3-4 Injection technique: single-shot Needle Needle type: Sprotte  Needle gauge: 24 G Needle length: 9 cm Assessment Sensory level: T6 Additional Notes Expiration date of kit checked and confirmed. Patient tolerated procedure well, without complications.

## 2019-03-21 NOTE — H&P (Signed)
PREOPERATIVE H&P  Chief Complaint: Left knee pain  HPI: Jillian [name] is a 63 y.o. female who presents for preoperative history and physical with a diagnosis of left knee anteromedial osteoarthritis. Symptoms are rated as moderate to severe, and have been worsening.  This is significantly impairing activities of daily living.  She has elected for surgical management.  She had her right knee done November 10 and has done well, and wishes to have the same thing on the left side.  She has constant pain, worse with movement, dull in nature, history of fibromyalgia.   Past Medical History:  Diagnosis Date  . Cancer ([place])    basal cell 2016  . Fibromyalgia   . Pre-diabetes   . Primary localized osteoarthritis of right knee 02/07/2019  . Sleep apnea    uses CPAP   Past Surgical History:  Procedure Laterality Date  . CESAREAN SECTION     times 2  . HERNIA REPAIR    . PARTIAL KNEE ARTHROPLASTY Right 02/07/2019   Procedure: UNICOMPARTMENTAL KNEE;  Surgeon: Marchia Bond, MD;  Location: [facility];  Service: Orthopedics;  Laterality: Right;   Social History   Socioeconomic History  . Marital status: Married    Spouse name: Not on file  . Number of children: 2  . Years of education: Not on file  . Highest education level: Not on file  Occupational History  . Occupation: Therapist, sports  Tobacco Use  . Smoking status: Former Smoker    Packs/day: 1.00    Years: 15.00    Pack years: 15.00    Types: Cigarettes    Quit date: 03/30/1992    Years since quitting: 26.9  . Smokeless tobacco: Never Used  Substance and Sexual Activity  . Alcohol use: No  . Drug use: No  . Sexual activity: Not on file  Other Topics Concern  . Not on file  Social History Narrative   Regular exercise-yes, but seasonal walks and swims   Diet: healthy, low carbohydrates, no caffeine   Social Determinants of Health   Financial Resource Strain:   . Difficulty of Paying Living Expenses: Not on file  Food Insecurity:   .  Worried About Charity fundraiser in the Last Year: Not on file  . Ran Out of Food in the Last Year: Not on file  Transportation Needs:   . Lack of Transportation (Medical): Not on file  . Lack of Transportation (Non-Medical): Not on file  Physical Activity:   . Days of Exercise per Week: Not on file  . Minutes of Exercise per Session: Not on file  Stress:   . Feeling of Stress : Not on file  Social Connections:   . Frequency of Communication with Friends and Family: Not on file  . Frequency of Social Gatherings with Friends and Family: Not on file  . Attends Religious Services: Not on file  . Active Member of Clubs or Organizations: Not on file  . Attends Archivist Meetings: Not on file  . Marital Status: Not on file   Family History  Problem Relation Age of Onset  . Cancer Father        colon  . Hyperlipidemia Father   . Hypertension Father   . Diabetes Sister   . Hypertension Sister   . Cancer Paternal Aunt        colon  . Cancer Maternal Grandmother        colon  . Hypertension Maternal Grandmother   . Anuerysm  Paternal Grandfather    No Known Allergies Prior to Admission medications   Medication Sig Start Date End Date Taking? Authorizing Provider  buPROPion (WELLBUTRIN XL) 150 MG 24 hr tablet TAKE 1 TABLET(150 MG) BY MOUTH DAILY 01/11/19  Yes Bedsole, Amy E, MD  cholecalciferol (VITAMIN D) 400 units TABS tablet Take 400 Units by mouth daily.    Yes [provider]  Coenzyme Q10 (CO Q 10) 100 MG CAPS Take 100 mg by mouth daily.    Yes [provider]  diclofenac Sodium (VOLTAREN) 1 % GEL Apply 2 g topically 3 (three) times daily as needed (pain).   Yes [provider]  DULoxetine (CYMBALTA) 30 MG capsule Take 3 capsules (90 mg total) by mouth daily. 01/19/19  Yes Bedsole, Amy E, MD  fish oil-omega-3 fatty acids 1000 MG capsule Take 4,000 mg by mouth daily.    Yes [provider]  Magnesium Malate 1250 (141.7 Mg) MG TABS  Take 141.7-283.4 mg by mouth See admin instructions. Take 141.7mg  by mouth every morning and take 283.4mg  every evening   Yes [provider]  methocarbamol (ROBAXIN) 500 MG tablet Take 1 tablet (500 mg total) by mouth 3 (three) times daily as needed for muscle spasms. 12/16/18  Yes Bedsole, Amy E, MD  pregabalin (LYRICA) 150 MG capsule Take 1 capsule (150 mg total) by mouth 3 (three) times daily. 02/15/19  Yes Bedsole, Amy E, MD  traMADol (ULTRAM) 50 MG tablet TAKE 2 TABLETS BY MOUTH THREE TIMES DAILY Patient taking differently: Take 100 mg by mouth 3 (three) times daily.  03/02/19  Yes Bedsole, Amy E, MD  aspirin EC 325 MG tablet Take 1 tablet (325 mg total) by mouth 2 (two) times daily. Patient not taking: Reported on 03/15/2019 02/07/19   Marchia Bond, MD     Positive ROS: All other systems have been reviewed and were otherwise negative with the exception of those mentioned in the HPI and as above.  Physical Exam: General: Alert, no acute distress Cardiovascular: No pedal edema Respiratory: No cyanosis, no use of accessory musculature GI: No organomegaly, abdomen is soft and non-tender Skin: No lesions in the area of chief complaint Neurologic: Sensation intact distally Psychiatric: Patient is competent for consent with normal mood and affect Lymphatic: No axillary or cervical lymphadenopathy  MUSCULOSKELETAL: Left knee has range of motion 0 to 95 degrees with pseudolaxity and positive crepitance medially.  Assessment: Left anteromedial knee osteoarthritis   Plan: Plan for Procedure(s): UNICOMPARTMENTAL KNEE  The risks benefits and alternatives were discussed with the patient including but not limited to the risks of nonoperative treatment, versus surgical intervention including infection, bleeding, nerve injury,  blood clots, cardiopulmonary complications, morbidity, mortality, among others, and they were willing to proceed.    Patient's anticipated LOS is less than 2  midnights, meeting these requirements: - Younger than 19 - Lives within 1 hour of care - Has a competent adult at home to recover with post-op recover - NO history of  - Chronic pain requiring opiods  - Diabetes  - Coronary Artery Disease  - Heart failure  - Heart attack  - Stroke  - DVT/VTE  - Cardiac arrhythmia  - Respiratory Failure/COPD  - Renal failure  - Anemia  - Advanced Liver disease        Johnny Bridge, MD Cell [phone]   03/21/2019 9:59 AM

## 2019-03-21 NOTE — Discharge Instructions (Signed)
INSTRUCTIONS AFTER JOINT REPLACEMENT   o Remove items at home which could result in a fall. This includes throw rugs or furniture in walking pathways o ICE to the affected joint every three hours while awake for 30 minutes at a time, for at least the first 3-5 days, and then as needed for pain and swelling.  Continue to use ice for pain and swelling. You may notice swelling that will progress down to the foot and ankle.  This is normal after surgery.  Elevate your leg when you are not up walking on it.   o Continue to use the breathing machine you got in the hospital (incentive spirometer) which will help keep your temperature down.  It is common for your temperature to cycle up and down following surgery, especially at night when you are not up moving around and exerting yourself.  The breathing machine keeps your lungs expanded and your temperature down.   DIET:  As you were doing prior to hospitalization, we recommend a well-balanced diet.  DRESSING / WOUND CARE / SHOWERING  You may change your dressing 3-5 days after surgery.  Then change the dressing every day with sterile gauze.  Please use good hand washing techniques before changing the dressing.  Do not use any lotions or creams on the incision until instructed by your surgeon.  ACTIVITY  o Increase activity slowly as tolerated, but follow the weight bearing instructions below.   o No driving for 6 weeks or until further direction given by your physician.  You cannot drive while taking narcotics.  o No lifting or carrying greater than 10 lbs. until further directed by your surgeon. o Avoid periods of inactivity such as sitting longer than an hour when not asleep. This helps prevent blood clots.  o You may return to work once you are authorized by your doctor.     WEIGHT BEARING   Weight bearing as tolerated with assist device (walker, cane, etc) as directed, use it as long as suggested by your surgeon or therapist, typically at  least 4-6 weeks.   EXERCISES  Results after joint replacement surgery are often greatly improved when you follow the exercise, range of motion and muscle strengthening exercises prescribed by your doctor. Safety measures are also important to protect the joint from further injury. Any time any of these exercises cause you to have increased pain or swelling, decrease what you are doing until you are comfortable again and then slowly increase them. If you have problems or questions, call your caregiver or physical therapist for advice.   Rehabilitation is important following a joint replacement. After just a few days of immobilization, the muscles of the leg can become weakened and shrink (atrophy).  These exercises are designed to build up the tone and strength of the thigh and leg muscles and to improve motion. Often times heat used for twenty to thirty minutes before working out will loosen up your tissues and help with improving the range of motion but do not use heat for the first two weeks following surgery (sometimes heat can increase post-operative swelling).   These exercises can be done on a training (exercise) mat, on the floor, on a table or on a bed. Use whatever works the best and is most comfortable for you.    Use music or television while you are exercising so that the exercises are a pleasant break in your day. This will make your life better with the exercises acting as a break   in your routine that you can look forward to.   Perform all exercises about fifteen times, three times per day or as directed.  You should exercise both the operative leg and the other leg as well.  Exercises include:   . Quad Sets - Tighten up the muscle on the front of the thigh (Quad) and hold for 5-10 seconds.   . Straight Leg Raises - With your knee straight (if you were given a brace, keep it on), lift the leg to 60 degrees, hold for 3 seconds, and slowly lower the leg.  Perform this exercise against  resistance later as your leg gets stronger.  . Leg Slides: Lying on your back, slowly slide your foot toward your buttocks, bending your knee up off the floor (only go as far as is comfortable). Then slowly slide your foot back down until your leg is flat on the floor again.  . Angel Wings: Lying on your back spread your legs to the side as far apart as you can without causing discomfort.  . Hamstring Strength:  Lying on your back, push your heel against the floor with your leg straight by tightening up the muscles of your buttocks.  Repeat, but this time bend your knee to a comfortable angle, and push your heel against the floor.  You may put a pillow under the heel to make it more comfortable if necessary.   A rehabilitation program following joint replacement surgery can speed recovery and prevent re-injury in the future due to weakened muscles. Contact your doctor or a physical therapist for more information on knee rehabilitation.    CONSTIPATION  Constipation is defined medically as fewer than three stools per week and severe constipation as less than one stool per week.  Even if you have a regular bowel pattern at home, your normal regimen is likely to be disrupted due to multiple reasons following surgery.  Combination of anesthesia, postoperative narcotics, change in appetite and fluid intake all can affect your bowels.   YOU MUST use at least one of the following options; they are listed in order of increasing strength to get the job done.  They are all available over the counter, and you may need to use some, POSSIBLY even all of these options:    Drink plenty of fluids (prune juice may be helpful) and high fiber foods Colace 100 mg by mouth twice a day  Senokot for constipation as directed and as needed Dulcolax (bisacodyl), take with full glass of water  Miralax (polyethylene glycol) once or twice a day as needed.  If you have tried all these things and are unable to have a bowel  movement in the first 3-4 days after surgery call either your surgeon or your primary doctor.    If you experience loose stools or diarrhea, hold the medications until you stool forms back up.  If your symptoms do not get better within 1 week or if they get worse, check with your doctor.  If you experience "the worst abdominal pain ever" or develop nausea or vomiting, please contact the office immediately for further recommendations for treatment.   ITCHING:  If you experience itching with your medications, try taking only a single pain pill, or even half a pain pill at a time.  You can also use Benadryl over the counter for itching or also to help with sleep.   TED HOSE STOCKINGS:  Use stockings on both legs until for at least 2 weeks or as   directed by physician office. They may be removed at night for sleeping.  MEDICATIONS:  See your medication summary on the "After Visit Summary" that nursing will review with you.  You may have some home medications which will be placed on hold until you complete the course of blood thinner medication.  It is important for you to complete the blood thinner medication as prescribed.  PRECAUTIONS:  If you experience chest pain or shortness of breath - call 911 immediately for transfer to the hospital emergency department.   If you develop a fever greater that 101 F, purulent drainage from wound, increased redness or drainage from wound, foul odor from the wound/dressing, or calf pain - CONTACT YOUR SURGEON.                                                   FOLLOW-UP APPOINTMENTS:  If you do not already have a post-op appointment, please call the office for an appointment to be seen by your surgeon.  Guidelines for how soon to be seen are listed in your "After Visit Summary", but are typically between 1-4 weeks after surgery.  OTHER INSTRUCTIONS:   Knee Replacement:  Do not place pillow under knee, focus on keeping the knee straight while resting.    MAKE SURE  YOU:  . Understand these instructions.  . Get help right away if you are not doing well or get worse.    Thank you for letting us be a part of your medical care team.  It is a privilege we respect greatly.  We hope these instructions will help you stay on track for a fast and full recovery!

## 2019-03-21 NOTE — Evaluation (Signed)
Physical Therapy Evaluation Patient Details Name: Jillian [name] MRN: [id number] DOB: Feb 12, 1956 Today's Date: 03/21/2019   History of Present Illness  L UKA; PMH of R UKA 02/07/19, sleep apnea, fibromyalgia  Clinical Impression  Pt ambulated 100' with RW and demonstrates good understanding of HEP. She is ready to DC home from PT standpoint.     Follow Up Recommendations Follow surgeon's recommendation for DC plan and follow-up therapies    Equipment Recommendations  None recommended by PT    Recommendations for Other Services       Precautions / Restrictions Precautions Precautions: Knee Precaution Booklet Issued: Yes (comment) Precaution Comments: reviewed no pillow under knee Restrictions Weight Bearing Restrictions: No Other Position/Activity Restrictions: WBAT      Mobility  Bed Mobility Overal bed mobility: Modified Independent             General bed mobility comments: HOB up  Transfers Overall transfer level: Modified independent Equipment used: Rolling walker (2 wheeled)                Ambulation/Gait Ambulation/Gait assistance: Supervision Gait Distance (Feet): 100 Feet Assistive device: Rolling walker (2 wheeled) Gait Pattern/deviations: Step-through pattern;Decreased stride length Gait velocity: decr   General Gait Details: good sequencing, pt reports BLEs feel "weak", but no buckling noted  Stairs Stairs: (pt declined stair training, she recalls technique from UKA 6 weeks ago)          Wheelchair Mobility    Modified Rankin (Stroke Patients Only)       Balance Overall balance assessment: Modified Independent                                           Pertinent Vitals/Pain Pain Assessment: 0-10 Pain Score: 4  Pain Location: L knee Pain Descriptors / Indicators: Sore Pain Intervention(s): Limited activity within patient's tolerance;Monitored during session;Premedicated before session;Ice applied     Home Living Family/patient expects to be discharged to:: Private residence Living Arrangements: Spouse/significant other Available Help at Discharge: Family Type of Home: House Home Access: Stairs to enter   Technical brewer of Steps: 1 Home Layout: Two level Home Equipment: Environmental consultant - 2 wheels;Cane - single point;Bedside commode      Prior Function Level of Independence: Independent               Hand Dominance        Extremity/Trunk Assessment   Upper Extremity Assessment Upper Extremity Assessment: Overall WFL for tasks assessed    Lower Extremity Assessment Lower Extremity Assessment: LLE deficits/detail LLE Deficits / Details: 3/5 SLR, knee AAROM 5-45* LLE Sensation: WNL(pt reports pelvic area a bit numb; LEs intact to light touch) LLE Coordination: WNL    Cervical / Trunk Assessment Cervical / Trunk Assessment: Normal  Communication   Communication: No difficulties  Cognition Arousal/Alertness: Awake/alert Behavior During Therapy: WFL for tasks assessed/performed Overall Cognitive Status: Within Functional Limits for tasks assessed                                        General Comments      Exercises Total Joint Exercises Ankle Circles/Pumps: AROM;Both;10 reps;Supine Quad Sets: AROM;Left;5 reps;Supine Short Arc Quad: AROM;Left;10 reps;Supine Heel Slides: AAROM;Left;10 reps;Supine Hip ABduction/ADduction: AAROM;Left;10 reps;Supine Straight Leg Raises: AROM;Left;5 reps;Supine Long Arc Quad: AROM;Left;5 reps;Seated  Assessment/Plan    PT Assessment All further PT needs can be met in the next venue of care  PT Problem List Decreased range of motion;Decreased strength;Pain;Decreased activity tolerance       PT Treatment Interventions      PT Goals (Current goals can be found in the Care Plan section)  Acute Rehab PT Goals Patient Stated Goal: return to work as Marine scientist at [employer] PT Goal Formulation: All assessment and  education complete, DC therapy    Frequency     Barriers to discharge        Co-evaluation               AM-PAC PT "6 Clicks" Mobility  Outcome Measure Help needed turning from your back to your side while in a flat bed without using bedrails?: None Help needed moving from lying on your back to sitting on the side of a flat bed without using bedrails?: A Little Help needed moving to and from a bed to a chair (including a wheelchair)?: None Help needed standing up from a chair using your arms (e.g., wheelchair or bedside chair)?: None Help needed to walk in hospital room?: None Help needed climbing 3-5 steps with a railing? : A Little 6 Click Score: 22    End of Session Equipment Utilized During Treatment: Gait belt Activity Tolerance: Patient tolerated treatment well Patient left: in bed;with call bell/phone within reach Nurse Communication: Mobility status PT Visit Diagnosis: Muscle weakness (generalized) (M62.81);Difficulty in walking, not elsewhere classified (R26.2)    Time: 0109-3235 PT Time Calculation (min) (ACUTE ONLY): 38 min   Charges:   PT Evaluation $PT Eval Low Complexity: 1 Low PT Treatments $Gait Training: 8-22 mins $Therapeutic Exercise: 8-22 mins        Blondell Reveal Kistler PT 03/21/2019  Acute Rehabilitation Services Pager [phone] Office [phone]

## 2019-03-21 NOTE — Transfer of Care (Signed)
Immediate Anesthesia Transfer of Care Note  Patient: Jillian [name]  Procedure(s) Performed: UNICOMPARTMENTAL KNEE (Left Knee)  Patient Location: PACU  Anesthesia Type:Regional and Spinal  Level of Consciousness: awake, alert  and oriented  Airway & Oxygen Therapy: Patient Spontanous Breathing and Patient connected to nasal cannula oxygen  Post-op Assessment: Report given to RN and Post -op Vital signs reviewed and stable  Post vital signs: Reviewed and stable  Last Vitals:  Vitals Value Taken Time  BP    Temp    Pulse 81 03/21/19 1259  Resp 15 03/21/19 1259  SpO2 97 % 03/21/19 1259  Vitals shown include unvalidated device data.  Last Pain:  Vitals:   03/21/19 0809  TempSrc:   PainSc: 3       Patients Stated Pain Goal: 3 ([id number])  Complications: No apparent anesthesia complications

## 2019-03-22 ENCOUNTER — Encounter: Payer: Self-pay | Admitting: *Deleted

## 2019-03-22 NOTE — Anesthesia Postprocedure Evaluation (Signed)
Anesthesia Post Note  Patient: Jillian [name]  Procedure(s) Performed: UNICOMPARTMENTAL KNEE (Left Knee)     Patient location during evaluation: PACU Anesthesia Type: Spinal and Regional Level of consciousness: oriented and awake and alert Pain management: pain level controlled Vital Signs Assessment: post-procedure vital signs reviewed and stable Respiratory status: spontaneous breathing, respiratory function stable and patient connected to nasal cannula oxygen Cardiovascular status: blood pressure returned to baseline and stable Postop Assessment: no headache, no backache, no apparent nausea or vomiting and spinal receding Anesthetic complications: no    Last Vitals:  Vitals:   03/21/19 1600 03/21/19 1645  BP: 121/74 120/74  Pulse: 92 88  Resp: 12 16  Temp:    SpO2: 96% 96%    Last Pain:  Vitals:   03/21/19 1645  TempSrc:   PainSc: 0-No pain                 Amori Colomb P Lauree Yurick

## 2019-03-27 ENCOUNTER — Encounter: Payer: Self-pay | Admitting: Physical Therapy

## 2019-03-27 ENCOUNTER — Other Ambulatory Visit: Payer: Self-pay

## 2019-03-27 ENCOUNTER — Encounter: Payer: PRIVATE HEALTH INSURANCE | Admitting: Physical Therapy

## 2019-03-27 ENCOUNTER — Ambulatory Visit: Payer: PRIVATE HEALTH INSURANCE | Admitting: Physical Therapy

## 2019-03-27 DIAGNOSIS — M6281 Muscle weakness (generalized): Secondary | ICD-10-CM

## 2019-03-27 DIAGNOSIS — R262 Difficulty in walking, not elsewhere classified: Secondary | ICD-10-CM

## 2019-03-27 DIAGNOSIS — M25561 Pain in right knee: Secondary | ICD-10-CM | POA: Diagnosis not present

## 2019-03-27 DIAGNOSIS — M25562 Pain in left knee: Secondary | ICD-10-CM

## 2019-03-27 DIAGNOSIS — R2681 Unsteadiness on feet: Secondary | ICD-10-CM

## 2019-03-27 NOTE — Therapy (Signed)
[facility] [address] [place], [address] Phone: [phone]   Fax:  [phone]  Physical Therapy No Visit Discharge Summary Reporting period: 02/13/2019 - 03/27/2019  Patient Details  Name: Jillian [name] MRN: [id number] Date of Birth: 13-Jan-1956 Referring Provider (PT): Johnny Bridge, MD   Encounter Date: 03/27/2019    Past Medical History:  Diagnosis Date  . Cancer ([place])    basal cell 2016  . Fibromyalgia   . Pre-diabetes   . Primary localized osteoarthritis of right knee 02/07/2019  . Sleep apnea    uses CPAP    Past Surgical History:  Procedure Laterality Date  . CESAREAN SECTION     times 2  . HERNIA REPAIR    . PARTIAL KNEE ARTHROPLASTY Right 02/07/2019   Procedure: UNICOMPARTMENTAL KNEE;  Surgeon: Marchia Bond, MD;  Location: [facility];  Service: Orthopedics;  Laterality: Right;  . PARTIAL KNEE ARTHROPLASTY Left 03/21/2019   Procedure: UNICOMPARTMENTAL KNEE;  Surgeon: Marchia Bond, MD;  Location: [facility];  Service: Orthopedics;  Laterality: Left;    There were no vitals filed for this visit.  Subjective Assessment - 03/27/19 1352    Subjective  Patient called and reported she saw her doctor who wants to perform the same procedure on the L side. Would like to stop physical therapy until this is complete.    Pertinent History  Relevant past medical history and comorbidities include skin cancer 2016, fibromyalgia since2005, and pre-diabetes etc.; surgeries include hernia repair and R unicompartmental knee arthroplasty performed 02/07/2019, etc., (See more details above)    Limitations  Walking;Lifting;Standing;House hold activities    How long can you stand comfortably?  10 mins    How long can you walk comfortably?  10 mins    Diagnostic tests  x-ray: Hemiarthroplasty medial aspect right knee.  Anatomic alignment.    Patient Stated Goals  Return to walk without pain and AD. Patient  also want to walk her dogs.    Pain Onset  In the past 7 days       OBJECTIVE Patient is not present for examination at this time. Please see previous documentation for latest objective data.    PT Short Term Goals - 02/16/19 1709      PT SHORT TERM GOAL #1   Title  Be independent with initial home exercise program for self-management of symptoms.    Baseline  initial HEP provided at IE (02/13/2019);    Time  2    Period  Weeks    Status  Achieved    Target Date  02/27/19        PT Long Term Goals - 03/27/19 1355      PT LONG TERM GOAL #1   Title  Be independent with a long-term home exercise program for self-management of symptoms.    Baseline  initial HEP provided at IE (02/13/2019)    Time  8    Period  Weeks    Status  Partially Met    Target Date  04/10/19      PT LONG TERM GOAL #2   Title  Demonstrate improved FOTO score by 10 units to demonstrate improvement in overall condition and self-reported functional ability.    Baseline  FOTO = 46 (02/13/2019)    Time  8    Period  Weeks    Status  Unable to assess    Target Date  04/10/19      PT  LONG TERM GOAL #3   Title  Reduce pain with functional activities to equal or less than 1/10 to allow patient to complete usual activities including ADLs, IADLs, and social engagement with less difficulty.    Baseline  2/10 min; 8/10 max (02/13/2019)    Time  8    Period  Weeks    Status  Partially Met    Target Date  04/10/19      PT LONG TERM GOAL #4   Title  Complete community, work and/or recreational activities without limitation due to current condition.    Baseline  ADLs and walking her dogs, standing, lifting, weight bearing, and various movements(02/13/2019)    Time  8    Period  Weeks    Status  Partially Met    Target Date  04/10/19      PT LONG TERM GOAL #5   Title  Demonstrated R knee extension ROM to neutral and flexion ROM to 130 degrees with less or equal to 1/10 pain at end range to increase functional  mobility and reduce fall risk.    Baseline  Flexion: 105 degrees; extension: 11 degrees to neutral  (02/13/2019); flexion = 120, extension -3 degrees (02/28/2019);    Time  8    Period  Weeks    Status  Partially Met    Target Date  04/10/19            Plan - 03/27/19 1355    Clinical Impression Statement  Patient attended 6 physical therapy sessions over the course of her episode of care and made good progress towards goals. She met ROM goals and is continuing to be limited by L knee pain more than R. She is being discharged from current episode of care after physician recommended unilateral arthroplasty on L knee on 03/21/2019. She plans to return for continued rehab following that surgery. She feels she has made adequate progress and is limited more by L knee than R at this point. She is now discharged from skilled physical therapy due to continued pain on L knee and patient preference.    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    PT Home Exercise Plan  [email or web address]    Consulted and Agree with Plan of Care  Patient       Patient will benefit from skilled therapeutic intervention in order to improve the following deficits and impairments:  Abnormal gait, Decreased balance, Decreased endurance, Decreased mobility, Difficulty walking, Hypomobility, Pain, Impaired flexibility, Decreased strength, Decreased activity tolerance, Decreased range of motion, Impaired perceived functional ability, Decreased skin integrity  Visit Diagnosis: Acute pain of right knee  Muscle weakness (generalized)  Unsteadiness on feet  Difficulty in walking, not elsewhere classified     Problem List Patient Active Problem List   Diagnosis Date Noted  . S/P left unicompartmental knee replacement 03/21/2019  . Osteoarthritis of left knee 03/09/2019  . Primary localized osteoarthritis of right knee 02/07/2019  . Status post right partial knee replacement 02/07/2019  . Neck abscess 12/23/2018  . Pre-op evaluation 12/23/2018  . Abscess 12/12/2018  . Vagina itching 07/14/2018  . Ear discharge, bilateral 07/14/2018  . Weight gain 07/14/2018  . Class 1 obesity due to excess calories without serious comorbidity with body mass index (BMI) of 33.0 to 33.9 in adult 05/13/2018  . Prediabetes 12/03/2017  . Snoring 02/27/2016  . Non-restorative sleep 02/27/2016  . Vitamin D deficiency 10/28/2015  . Generalized anxiety disorder 11/25/2012  . HYPERTRIGLYCERIDEMIA 12/20/2008  . MENOPAUSE, EARLY 10/04/2008  . Fibromyalgia 10/04/2008    Everlean Alstrom. Graylon Good, PT, DPT 03/27/19, 1:57 PM  [facility] [address], [place], [address] Phone: [phone]   Fax:   [phone]  Name: Jillian [name] MRN: [id number] Date of Birth: 1955-07-11

## 2019-03-27 NOTE — Therapy (Signed)
[facility] [address] [place], [address] Phone: [phone]   Fax:  [phone]  Physical Therapy Evaluation  Patient Details  Name: Jillian [name] MRN: [id number] Date of Birth: 1956/01/07 Referring Provider (PT): Johnny Bridge, MD   Encounter Date: 03/27/2019  PT End of Session - 03/28/19 1424    Visit Number  1    Number of Visits  16    Date for PT Re-Evaluation  05/23/19    Authorization Type  [employer] reporting from 03/27/2019    Authorization - Visit Number  1    Authorization - Number of Visits  10    PT Start Time  [id number]    PT Stop Time  1430    PT Time Calculation (min)  45 min    Activity Tolerance  Patient limited by pain    Behavior During Therapy  [facility] for tasks assessed/performed       Past Medical History:  Diagnosis Date  . Cancer ([place])    basal cell 2016  . Fibromyalgia   . Pre-diabetes   . Primary localized osteoarthritis of right knee 02/07/2019  . Sleep apnea    uses CPAP    Past Surgical History:  Procedure Laterality Date  . CESAREAN SECTION     times 2  . HERNIA REPAIR    . PARTIAL KNEE ARTHROPLASTY Right 02/07/2019   Procedure: UNICOMPARTMENTAL KNEE;  Surgeon: Marchia Bond, MD;  Location: [facility];  Service: Orthopedics;  Laterality: Right;  . PARTIAL KNEE ARTHROPLASTY Left 03/21/2019   Procedure: UNICOMPARTMENTAL KNEE;  Surgeon: Marchia Bond, MD;  Location: [facility];  Service: Orthopedics;  Laterality: Left;    There were no vitals filed for this visit.   Subjective Assessment - 03/27/19 1406    Subjective  Patient reports she is returning for physical therapy s/p L knee unilateral arthroplasty performed 03/21/2019. She reports she was discharged same day as surgery from the hospital and has been home without any home health PT since 03/21/2019. She is known to this clinician and recently completed physical therapy for the same procedure performed on R knee  02/07/2019. She states her most recent surgery went well but she has been having a really hard time at home. She states the nerve block wore off 2-3 days following the surgery and she had sudden intense pain for 24 hours. She has had several near falls, a lot of pain. For example, she had sudden shooting pain while coming to PT today when she had sudden shooting pain. She was given a HEP by the acute care PT on the day of her surgery but has not been able to do most of it because of the pain. She feels very unsteady due to her R knee also recovering from the same procedure 02/07/2019. She reports the L knee has been much harder. Her doctor has called in more pain medication for her. Reports surgeon noted that "this side really needed it" after surgery.    Pertinent History  Patient is a 63 y.o. female who presents to outpatient physical therapy with a referral for medical diagnosis s/p left uni knee arthroplasty. This patient's chief complaints consist of L knee pain, stiffness, and weakness leading to the following functional deficits: difficulty with walking, bed mobility, transfers, basic ADLs, IADLs, driving, lifting, walking dog, housework, cooking, standing, weight bearing, stairs, sewing/craftes, social activities, stepping, sleeping. Relevant past medical history and comorbidities include recent R unilateral knee arthroplasty completed  02/07/2019, skin cancer 2016, fibromyalgia since 2005, and pre-diabetes etc.; surgeries include hernia repair See more details above).    Limitations  Walking;Lifting;Standing;House hold activities;Sitting    How long can you stand comfortably?  10 mins    How long can you walk comfortably?  10 mins    Diagnostic tests  x-ray: Hemiarthroplasty medial aspect right knee.  Anatomic alignment.  L xray report post op: Satisfactory immediate postoperative appearance status post medialcompartment left knee hemiarthroplasty.    Patient Stated Goals  return to work by May 02, 2019.    Currently in Pain?  Yes    Pain Score  8    Best: 5/10; Worst: 12/10   Pain Location  Knee   anterior and medial knee currently; sometimes at L medial thigh or lateral proximal lower leg.   Pain Orientation  Left    Pain Descriptors / Indicators  Sharp   Intense, grating   Pain Type  Surgical pain    Pain Radiating Towards  towards L groin. Notes skin is numb over L knee.    Pain Onset  In the past 7 days    Pain Frequency  Constant    Aggravating Factors   all weight bearing activities, keeping it in any position more than a few mintues.    Pain Relieving Factors  changing position, pain medication, ice    Effect of Pain on Daily Activities  Functional limitations: walking, bed mobility, transfers, basic ADLs, IADLs, driving, lifting, walking dog, housework, cooking, standing, weight bearing, stairs, sewing/craftes, social activities, stepping, sleeping.         [facility] PT Assessment - 03/28/19 0001      Assessment   Medical Diagnosis   s/p right knee unicopmartmental arthroplasty    Referring Provider (PT)  Johnny Bridge, MD    Onset Date/Surgical Date  03/21/19    Hand Dominance  Right    Next MD Visit  04/03/2019    Prior Therapy  one visit at the hospital. Recently had outpatient PT for same procedure on R knee      Precautions   Precautions  Fall;Other (comment)   no driving until MD gives OK (at least 6 weeks).      Restrictions   Weight Bearing Restrictions  No      Balance Screen   Has the patient fallen in the past 6 months  No    Has the patient had a decrease in activity level because of a fear of falling?   No    Is the patient reluctant to leave their home because of a fear of falling?   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 Posture: stands with RW with L LE unweighted, lacks knee extension.  Sits with L knee extended slightly.  . Tremor: none . Muscle bulk: WNL given history.  . Skin: The incision sites appear to be healing well with no excessive redness, warmth, drainage or signs of infection present.   . Gait: mod I for household and short community ambulation using RW. Antalgic gait favoring L LE, decreased L weight bearing, lacking L knee flexion and extension, short step length, significant weight bearing through [place].   NEUROLOGICAL  PERIPHERAL JOINT MOTION (in degrees)  *Indicates pain, R/L 03/27/19    Joint/Motion AROM PROM Comments  Knee     Flexion 125/82* /95*   Extension 0/-17* 0/-15*   Comments: hip and ankles grossly WFL, not formally tested. L knee very painful to motion.    MUSCLE PERFORMANCE (MMT):   *Indicates pain 03/28/19 Date Date  Joint/Motion R/L R/L R/L  Hip     Flexion (knee flex) 4+/4* / /  Abduction (seated) 4+/4+* / /  Adduction (seated) 4+/3+* / /  Knee     Flexion 5*/4+ / /  Extension 5*/4* / /  Comments: B ankle WFL  PALPATION: - TTP surrounding L incision, warm but not excessive..  FUNCTIONAL MOBILITY: - Bed mobility: sit <> supine mod I limited  by pain.  - Transfers: sit <> stand mod I with pain.   FUNCTIONAL/BALANCE TESTS: - L quad set: inhibited but able to activate weakly.  - L Active SLR: extensor lag present Deferred to next session due to pain.   EDUCATION/COGNITION: Patient is alert and oriented X 4.  Objective measurements completed on examination: See above findings.    TREATMENT:  Therapeutic exercise: to centralize symptoms and improve ROM, strength, muscular endurance, and activity tolerance required for successful completion of functional activities.  - briefly reviewed exercises given by acute care PT, advised to continue - educated on keeping knee in extension during rest - quad set with towel roll behind knee, x 10 - Education on HEP  - Education on diagnosis, prognosis, POC, anatomy and physiology of current condition. Discussed possible use of TENS machine for pain control. Provided handout of example of one that may be helpful.   HOME EXERCISE PROGRAM Access Code: [email or web address]  URL: [email or web address]  Date: 03/28/2019  Prepared by: Rosita Kea   Exercises Supine Quad Set - 20 reps - 5 seconds hold - 2x daily    PT Education - 03/28/19 1410    Education Details  Exercise purpose/form. Self management techniques. Education on diagnosis, prognosis, POC, anatomy and physiology of current condition Education on HEP    Person(s) Educated  Patient    Methods  Explanation;Demonstration;Tactile cues;Verbal cues    Comprehension  Returned demonstration;Verbalized understanding;Verbal cues required;Tactile cues required;Need further instruction       PT Short Term Goals - 03/28/19 1413      PT SHORT TERM GOAL #1   Title  Be independent with initial home exercise program for self-management of symptoms.    Baseline  initial HEP provided at IE (03/27/2019);    Time  2    Period  Weeks    Status  New    Target Date  04/11/19        PT Long Term Goals - 03/28/19 1414      PT LONG TERM  GOAL #1   Title  Be independent with a long-term home exercise program for self-management of symptoms.    Baseline  initial HEP provided at IE (03/27/2019)    Time  8    Period  Weeks    Status  New    Target Date  05/23/19      PT LONG TERM GOAL #2   Title  Demonstrate improved FOTO score by 10 units to demonstrate improvement in overall condition and self-reported functional ability.    Baseline  FOTO = 30 (03/27/2019)    Time  8    Period  Weeks    Status  New    Target Date  05/23/19      PT LONG TERM GOAL #3   Title  Reduce pain with functional activities to equal or less than 1/10 to allow patient to complete usual activities including ADLs, IADLs, and social engagement with less difficulty.    Baseline  55/10 min; 12/10 max (03/27/2019)    Time  8    Period  Weeks    Status  New    Target Date  05/23/19      PT LONG TERM GOAL #4   Title  Complete community, work and/or recreational activities without limitation due to current condition.    Baseline  Functional limitations: walking, bed mobility, transfers, basic ADLs, IADLs, driving, lifting, walking dog, housework, cooking, standing, weight bearing, stairs, sewing/craftes, social activities, stepping, sleeping (03/27/2019);    Time  8    Period  Weeks    Status  New    Target Date  05/23/19      PT LONG TERM GOAL #5   Title  Demonstrated R knee extension PROM to neutral and flexion PROM to 130 degrees with less or equal to 1/10 pain at end range to increase functional mobility and reduce fall risk.    Baseline  flexion: 95, extension: -15 and limited by pian (03/27/2019);    Time  8    Period  Weeks    Status  New    Target Date  05/23/19      Additional Long Term Goals   Additional Long Term Goals  Yes      PT LONG TERM GOAL #6   Title  Patient will demonstrate timed get up and go (TUG) test equal or less than 11 seconds without use of BUE or AD to demonstrate improved fall risk and functional mobility.     Baseline  deferred test due to pain (03/27/2019);    Time  8    Period  Weeks    Status  New    Target Date  05/23/19             Plan - 03/28/19 1419    Clinical Impression Statement  Patient is a 63 y.o. female who presents to outpatient physical therapy with a referral for medical diagnosis of L unicompartmental knee arthroplasty performed 03/21/2019. This patient presents with the sign and symptoms consistent with L knee pain, weakness, reduced balance, and stiffness with functional activities/recreational activities. Upon assessment, pt demonstrated deficits such as deficits in strength, mobility, weakness, significant pain with mobility, and limited L knee ROM. These deficits limit the patient ability to perform things such as ADLs, IADLs, social  participation, caring for others, engaging in hobbies (walking with her dogs), working, and impairs their quality of life. The pt will benefit from skilled PT services to address deficits and return to PLOF and independence, recreational activity and work.    Personal Factors and Comorbidities  Comorbidity 3+;Age;Past/Current Experience;Fitness;Time since onset of injury/illness/exacerbation    Comorbidities  Relevant past medical history and comorbidities include recent R unilateral knee arthroplasty completed 02/07/2019, skin cancer 2016, fibromyalgia since 2005, and pre-diabetes    Examination-Activity Limitations  Bed Mobility;Bathing;Stairs;Squat;Toileting;Sit;Bend;Caring for Others;Lift;Transfers;Dressing;Hygiene/Grooming;Sleep;Locomotion Level;Carry;Stand    Examination-Participation Restrictions  Cleaning;Laundry;Yard Work;Community Activity;Interpersonal Relationship;Driving;Meal Prep;Shop   work   Merchant [employer] officer  Evolving/Moderate complexity    Clinical Decision Making  Moderate    Rehab Potential  Good    PT Frequency  2x / week    PT Duration  8 weeks    PT Treatment/Interventions  ADLs/Self Care Home  Management;Cryotherapy;Moist Heat;Stair training;Functional mobility training;Therapeutic activities;Therapeutic exercise;Balance training;Neuromuscular re-education;Manual techniques;Dry needling;Scar mobilization;Passive range of motion;Joint Manipulations;Spinal Manipulations;Electrical Stimulation;Patient/family education;Aquatic Therapy;Gait training;DME Instruction    PT Next Visit Plan  pain control, orthopedic edema massage, strengthening and ROM as tolerated    PT Home Exercise Plan  Medbridge:[email or web address]    Consulted and Agree with Plan of Care  Patient       Patient will benefit from skilled therapeutic intervention in order to improve the following deficits and impairments:  Abnormal gait, Decreased balance, Decreased endurance, Decreased mobility, Difficulty walking, Hypomobility, Pain, Impaired flexibility, Decreased strength, Decreased activity tolerance, Decreased range of motion, Impaired perceived functional ability, Decreased skin integrity, Increased muscle spasms, Impaired sensation, Decreased scar mobility, Increased edema, Decreased knowledge of use of DME  Visit Diagnosis: Left knee pain, unspecified chronicity  Muscle weakness (generalized)  Unsteadiness on feet  Difficulty in walking, not elsewhere classified     Problem List Patient Active Problem List   Diagnosis Date Noted  . S/P left unicompartmental knee replacement 03/21/2019  . Osteoarthritis of left knee 03/09/2019  . Primary localized osteoarthritis of right knee 02/07/2019  . Status post right partial knee replacement 02/07/2019  . Neck abscess 12/23/2018  . Pre-op evaluation 12/23/2018  . Abscess 12/12/2018  . Vagina itching 07/14/2018  . Ear discharge, bilateral 07/14/2018  . Weight gain 07/14/2018  . Class 1 obesity due to excess calories without serious comorbidity with body mass index (BMI) of 33.0 to 33.9 in adult 05/13/2018  . Prediabetes 12/03/2017  . Snoring 02/27/2016  . Non-restorative  sleep 02/27/2016  . Vitamin D deficiency 10/28/2015  . Generalized anxiety disorder 11/25/2012  . HYPERTRIGLYCERIDEMIA 12/20/2008  . MENOPAUSE, EARLY 10/04/2008  . Fibromyalgia 10/04/2008    Everlean Alstrom. Graylon Good, PT, DPT 03/28/19, 2:25 PM  [facility] [address], [place], [address] Phone: [phone]   Fax:  [phone]  Name: ARYIANNA MISHLER MRN: [id number] Date of Birth: 11-27-1955

## 2019-03-28 ENCOUNTER — Encounter: Payer: Self-pay | Admitting: Physical Therapy

## 2019-03-30 ENCOUNTER — Other Ambulatory Visit: Payer: Self-pay

## 2019-03-30 ENCOUNTER — Ambulatory Visit: Payer: PRIVATE HEALTH INSURANCE | Admitting: Physical Therapy

## 2019-03-30 ENCOUNTER — Encounter: Payer: PRIVATE HEALTH INSURANCE | Admitting: Physical Therapy

## 2019-03-30 ENCOUNTER — Encounter: Payer: Self-pay | Admitting: Physical Therapy

## 2019-03-30 DIAGNOSIS — R262 Difficulty in walking, not elsewhere classified: Secondary | ICD-10-CM

## 2019-03-30 DIAGNOSIS — M25562 Pain in left knee: Secondary | ICD-10-CM

## 2019-03-30 DIAGNOSIS — R2681 Unsteadiness on feet: Secondary | ICD-10-CM

## 2019-03-30 DIAGNOSIS — M6281 Muscle weakness (generalized): Secondary | ICD-10-CM

## 2019-03-30 DIAGNOSIS — M25561 Pain in right knee: Secondary | ICD-10-CM | POA: Diagnosis not present

## 2019-03-30 NOTE — Therapy (Signed)
[facility] [address] [place], [address] Phone: [phone]   Fax:  [phone]  Physical Therapy Treatment  Patient Details  Name: Jillian [name] MRN: [id number] Date of Birth: 08/21/1955 Referring Provider (PT): Johnny Bridge, MD   Encounter Date: 03/30/2019  PT End of Session - 03/30/19 1756    Visit Number  2    Number of Visits  16    Date for PT Re-Evaluation  05/23/19    Authorization Type  [employer] reporting from 03/27/2019    Authorization - Visit Number  2    Authorization - Number of Visits  10    PT Start Time  7858    PT Stop Time  1728    PT Time Calculation (min)  38 min    Activity Tolerance  Patient limited by pain    Behavior During Therapy  [facility] for tasks assessed/performed       Past Medical History:  Diagnosis Date  . Cancer ([place])    basal cell 2016  . Fibromyalgia   . Pre-diabetes   . Primary localized osteoarthritis of right knee 02/07/2019  . Sleep apnea    uses CPAP    Past Surgical History:  Procedure Laterality Date  . CESAREAN SECTION     times 2  . HERNIA REPAIR    . PARTIAL KNEE ARTHROPLASTY Right 02/07/2019   Procedure: UNICOMPARTMENTAL KNEE;  Surgeon: Marchia Bond, MD;  Location: [facility];  Service: Orthopedics;  Laterality: Right;  . PARTIAL KNEE ARTHROPLASTY Left 03/21/2019   Procedure: UNICOMPARTMENTAL KNEE;  Surgeon: Marchia Bond, MD;  Location: [facility];  Service: Orthopedics;  Laterality: Left;    There were no vitals filed for this visit.  Subjective Assessment - 03/30/19 1710    Subjective  Patient reports her knee is feeling better today and seems to have "turned a corner" about 2 days ago and really more today. She rates her pain at 4/10 upon arrival. Reports she was in a lot of pain following last treatment session but notes she was in a lot of pain prior to last session. She is continuing to perform her HEP program.    Pertinent History   Patient is a 63 y.o. female who presents to outpatient physical therapy with a referral for medical diagnosis s/p left uni knee arthroplasty. This patient's chief complaints consist of L knee pain, stiffness, and weakness leading to the following functional deficits: difficulty with walking, bed mobility, transfers, basic ADLs, IADLs, driving, lifting, walking dog, housework, cooking, standing, weight bearing, stairs, sewing/craftes, social activities, stepping, sleeping. Relevant past medical history and comorbidities include recent R unilateral knee arthroplasty completed 02/07/2019, skin cancer 2016, fibromyalgia since 2005, and pre-diabetes etc.; surgeries include hernia repair See more details above).    Limitations  Walking;Lifting;Standing;House hold activities;Sitting    How long can you stand comfortably?  10 mins    How long can you walk comfortably?  10 mins    Diagnostic tests  x-ray: Hemiarthroplasty medial aspect right knee.  Anatomic alignment.  L xray report post op: Satisfactory immediate postoperative appearance status post medialcompartment left knee hemiarthroplasty.    Patient Stated Goals  return to work by May 02, 2019.    Currently in Pain?  Yes    Pain Score  4     Pain Location  Knee    Pain Orientation  Left    Pain Onset  In the past 7 days  OBJECTIVE:  - The incision site appears to be healing well with no excessive redness, warmth, drainage or signs of infection present.  - L knee AAROM: flexion = 100 degrees ERP - Timed Up and Go (TUG): 24.5 seconds using BUE support for transfer and SPC during ambulation. Induced pain so only averaged 2 trials.   TREATMENT:  Therapeutic exercise:to centralize symptoms and improve ROM, strength, muscular endurance, and activity tolerance required for successful completion of functional activities.  (manual therapy - see below) - quad set with towel roll behind knee, x 10. Uncomfortable.  - Back and forth in available range  of motion with self-overpressure at end range to improve joint ROM. X 10 minutes. Measured 100 degrees flexion following.  - ambulation with SPC with education on height and how to use it for walking as well as 2x up and down 4 steps (6 inch steps) using SPC and one handrail. Pt prefers to step up with affected leg. Discussed the merits and pitfalls of this.  - TUG test x 2 to practice mobility and get baseline data (see above).  - Education on HEP including handout.  - Education on diagnosis, prognosis, POC, anatomy and physiology of current condition.   Manual therapy: to reduce pain and tissue tension, improve range of motion, neuromodulation, in order to promote improved ability to complete functional activities. - supine L LE orthopedic edema massage: flat finger circles to thigh, scooping at lower leg, and thumbcircles around patella towards incision. To encourage clearance of swelling to decrease pain and improve comfort and ROM. Pt reports it feels good and no pain during application of technique.  - gentle PROM L knee extension with gentle OP as tolerated for 3 second holds x 20.   HOME EXERCISE PROGRAM Access Code: [email or web address]  URL: [email or web address]  Date: 03/28/2019  Prepared by: Rosita Kea   Exercises Supine Quad Set - 20 reps - 5 seconds hold - 2x daily Active Straight Leg Raise with Quad Set - 3 sets - 10 reps - 1x daily Supine Heel Slide with Strap - 20 reps - 5 seconds hold - 2x daily Hamstring stretch (with strap) - 3 reps - 30 seconds hold - 2x daily   PT Education - 03/30/19 1756    Education Details  Exercise purpose/form. Self management techniques. HEP. healing process    Person(s) Educated  Patient    Methods  Explanation;Demonstration;Tactile cues;Verbal cues;Handout    Comprehension  Verbalized understanding;Verbal cues required;Need further instruction;Tactile cues required;Returned demonstration       PT Short Term Goals - 03/28/19 1413       PT SHORT TERM GOAL #1   Title  Be independent with initial home exercise program for self-management of symptoms.    Baseline  initial HEP provided at IE (03/27/2019);    Time  2    Period  Weeks    Status  New    Target Date  04/11/19        PT Long Term Goals - 03/30/19 1736      PT LONG TERM GOAL #1   Title  Be independent with a long-term home exercise program for self-management of symptoms.    Baseline  initial HEP provided at IE (03/27/2019)    Time  8    Period  Weeks    Status  Partially Met    Target Date  05/23/19      PT LONG TERM GOAL #2   Title  Demonstrate improved FOTO score by 10  units to demonstrate improvement in overall condition and self-reported functional ability.    Baseline  FOTO = 30 (03/27/2019)    Time  8    Period  Weeks    Status  On-going    Target Date  05/23/19      PT LONG TERM GOAL #3   Title  Reduce pain with functional activities to equal or less than 1/10 to allow patient to complete usual activities including ADLs, IADLs, and social engagement with less difficulty.    Baseline  5/10 min; 12/10 max (03/27/2019)    Time  8    Period  Weeks    Status  Partially Met    Target Date  05/23/19      PT LONG TERM GOAL #4   Title  Complete community, work and/or recreational activities without limitation due to current condition.    Baseline  Functional limitations: walking, bed mobility, transfers, basic ADLs, IADLs, driving, lifting, walking dog, housework, cooking, standing, weight bearing, stairs, sewing/craftes, social activities, stepping, sleeping (03/27/2019);    Time  8    Period  Weeks    Status  Partially Met    Target Date  05/23/19      PT LONG TERM GOAL #5   Title  Demonstrated R knee extension PROM to neutral and flexion PROM to 130 degrees with less or equal to 1/10 pain at end range to increase functional mobility and reduce fall risk.    Baseline  flexion: 95, extension: -15 and limited by pian (03/27/2019); flexion =  100 (03/30/2019);    Time  8    Period  Weeks    Status  Partially Met    Target Date  05/23/19      PT LONG TERM GOAL #6   Title  Patient will demonstrate timed get up and go (TUG) test equal or less than 11 seconds without use of BUE or AD to demonstrate improved fall risk and functional mobility.    Baseline  deferred test due to pain (03/27/2019); 24.5s with BUE support on chair and SPC with increased pain (03/30/2019);    Time  8    Period  Weeks    Status  On-going    Target Date  05/23/19            Plan - 03/30/19 1755    Clinical Impression Statement  Patient tolerated treatment well overall and shows good progress towards goals since last session. Has graduated to less restrictive AD and demonstrated improved ROM and pain level this session. Did report increase in pain from 4/10 to 5/10 by end of session but also flexed L knee significantly during stand to sit during TUG test that elevated pain. No concerns at this point about incision or condition of surgical site. Patient would benefit from continued management of limiting condition by skilled physical therapist to address remaining impairments and functional limitations to work towards stated goals and return to PLOF or maximal functional independence.    Personal Factors and Comorbidities  Comorbidity 3+;Age;Past/Current Experience;Fitness;Time since onset of injury/illness/exacerbation    Comorbidities  Relevant past medical history and comorbidities include recent R unilateral knee arthroplasty completed 02/07/2019, skin cancer 2016, fibromyalgia since 2005, and pre-diabetes    Examination-Activity Limitations  Bed Mobility;Bathing;Stairs;Squat;Toileting;Sit;Bend;Caring for Others;Lift;Transfers;Dressing;Hygiene/Grooming;Sleep;Locomotion Level;Carry;Stand    Examination-Participation Restrictions  Cleaning;Laundry;Yard Work;Community Activity;Interpersonal Relationship;Driving;Meal Prep;Shop   work   Stability/Clinical  Decision Making  Evolving/Moderate complexity    Rehab Potential  Good    PT Frequency  2x / week    PT Duration  8 weeks    PT Treatment/Interventions  ADLs/Self Care Home Management;Cryotherapy;Moist Heat;Stair training;Functional mobility training;Therapeutic activities;Therapeutic exercise;Balance training;Neuromuscular re-education;Manual techniques;Dry needling;Scar mobilization;Passive range of motion;Joint Manipulations;Spinal Manipulations;Electrical Stimulation;Patient/family education;Aquatic Therapy;Gait training;DME Instruction    PT Next Visit Plan  pain control, orthopedic edema massage, strengthening and ROM as tolerated    PT Home Exercise Plan  Medbridge:[email or web address]    Consulted and Agree with Plan of Care  Patient       Patient will benefit from skilled therapeutic intervention in order to improve the following deficits and impairments:  Abnormal gait, Decreased balance, Decreased endurance, Decreased mobility, Difficulty walking, Hypomobility, Pain, Impaired flexibility, Decreased strength, Decreased activity tolerance, Decreased range of motion, Impaired perceived functional ability, Decreased skin integrity, Increased muscle spasms, Impaired sensation, Decreased scar mobility, Increased edema, Decreased knowledge of use of DME  Visit Diagnosis: Left knee pain, unspecified chronicity  Muscle weakness (generalized)  Unsteadiness on feet  Difficulty in walking, not elsewhere classified     Problem List Patient Active Problem List   Diagnosis Date Noted  . S/P left unicompartmental knee replacement 03/21/2019  . Osteoarthritis of left knee 03/09/2019  . Primary localized osteoarthritis of right knee 02/07/2019  . Status post right partial knee replacement 02/07/2019  . Neck abscess 12/23/2018  . Pre-op evaluation 12/23/2018  . Abscess 12/12/2018  . Vagina itching 07/14/2018  . Ear discharge, bilateral 07/14/2018  . Weight gain 07/14/2018  . Class 1 obesity due to  excess calories without serious comorbidity with body mass index (BMI) of 33.0 to 33.9 in adult 05/13/2018  . Prediabetes 12/03/2017  . Snoring 02/27/2016  . Non-restorative sleep 02/27/2016  . Vitamin D deficiency 10/28/2015  . Generalized anxiety disorder 11/25/2012  . HYPERTRIGLYCERIDEMIA 12/20/2008  . MENOPAUSE, EARLY 10/04/2008  . Fibromyalgia 10/04/2008    Everlean Alstrom. Graylon Good, PT, DPT 03/30/19, 5:57 PM  [facility] [address] [place], [address] Phone: [phone]   Fax:  [phone]  Name: ELEENA GRATER MRN: [id number] Date of Birth: [birth year]

## 2019-04-04 ENCOUNTER — Encounter: Payer: Self-pay | Admitting: Physical Therapy

## 2019-04-04 ENCOUNTER — Other Ambulatory Visit: Payer: Self-pay

## 2019-04-04 ENCOUNTER — Ambulatory Visit: Payer: PRIVATE HEALTH INSURANCE | Attending: Orthopedic Surgery | Admitting: Physical Therapy

## 2019-04-04 DIAGNOSIS — M25562 Pain in left knee: Secondary | ICD-10-CM | POA: Diagnosis not present

## 2019-04-04 DIAGNOSIS — R262 Difficulty in walking, not elsewhere classified: Secondary | ICD-10-CM | POA: Diagnosis present

## 2019-04-04 DIAGNOSIS — M6281 Muscle weakness (generalized): Secondary | ICD-10-CM | POA: Diagnosis present

## 2019-04-04 DIAGNOSIS — R2681 Unsteadiness on feet: Secondary | ICD-10-CM

## 2019-04-04 NOTE — Therapy (Signed)
[facility] [address] [place], [address] Phone: [phone]   Fax:  [phone]  Physical Therapy Treatment  Patient Details  Name: Jillian [name] MRN: [id number] Date of Birth: 1955-05-25 Referring Provider (PT): Johnny Bridge, MD   Encounter Date: 04/04/2019  PT End of Session - 04/04/19 1659    Visit Number  3    Number of Visits  16    Date for PT Re-Evaluation  05/23/19    Authorization Type  [employer] reporting from 03/27/2019    Authorization - Visit Number  3    Authorization - Number of Visits  10    PT Start Time  6945    PT Stop Time  1730    PT Time Calculation (min)  40 min    Equipment Utilized During Treatment  --    Activity Tolerance  Patient limited by pain    Behavior During Therapy  [facility] for tasks assessed/performed       Past Medical History:  Diagnosis Date  . Cancer ([place])    basal cell 2016  . Fibromyalgia   . Pre-diabetes   . Primary localized osteoarthritis of right knee 02/07/2019  . Sleep apnea    uses CPAP    Past Surgical History:  Procedure Laterality Date  . CESAREAN SECTION     times 2  . HERNIA REPAIR    . PARTIAL KNEE ARTHROPLASTY Right 02/07/2019   Procedure: UNICOMPARTMENTAL KNEE;  Surgeon: Marchia Bond, MD;  Location: [facility];  Service: Orthopedics;  Laterality: Right;  . PARTIAL KNEE ARTHROPLASTY Left 03/21/2019   Procedure: UNICOMPARTMENTAL KNEE;  Surgeon: Marchia Bond, MD;  Location: [facility];  Service: Orthopedics;  Laterality: Left;    There were no vitals filed for this visit.  Subjective Assessment - 04/04/19 1654    Subjective  Patient reports her pain is 6/10 in the whole L knee, especially in the medial and posterior aspects. Reports she felt really good on Sunday so she did a lot of cooking. She reports she felt okay following last treatment session. She saw her doctor yesterday who was very pleased with her progress and also said the knee  was pretty bad when he did the surgery and "really needed it."    Pertinent History  Patient is a 64 y.o. female who presents to outpatient physical therapy with a referral for medical diagnosis s/p left uni knee arthroplasty. This patient's chief complaints consist of L knee pain, stiffness, and weakness leading to the following functional deficits: difficulty with walking, bed mobility, transfers, basic ADLs, IADLs, driving, lifting, walking dog, housework, cooking, standing, weight bearing, stairs, sewing/craftes, social activities, stepping, sleeping. Relevant past medical history and comorbidities include recent R unilateral knee arthroplasty completed 02/07/2019, skin cancer 2016, fibromyalgia since 2005, and pre-diabetes etc.; surgeries include hernia repair See more details above).    Limitations  Walking;Lifting;Standing;House hold activities;Sitting    How long can you stand comfortably?  10 mins    How long can you walk comfortably?  10 mins    Diagnostic tests  x-ray: Hemiarthroplasty medial aspect right knee.  Anatomic alignment.  L xray report post op: Satisfactory immediate postoperative appearance status post medialcompartment left knee hemiarthroplasty.    Patient Stated Goals  return to work by May 02, 2019.    Currently in Pain?  Yes    Pain Score  6     Pain Location  Knee    Pain Orientation  Left    Pain Type  Surgical pain    Pain Onset  In the past 7 days       OBJECTIVE:  - The incision site appears to be healing well with no excessive redness, warmth, drainage or signs of infection present.  - L knee AAROM: flexion = 112 degrees ERP; extension = - 5, ERP  TREATMENT: Denies sensitivity to latex  Therapeutic exercise:to centralize symptoms and improve ROM, strength, muscular endurance, and activity tolerance required for successful completion of functional activities.  - Recumbent Bike no resistance. Seat setting 10. For improved lower extremity ROM, muscular  endurance, and activity tolerance; and to induce the analgesic effect of aerobic exercise, stimulate joint nutrition, and prepare body structures and systems for following interventions. x 5  Minutes during subjective exam.   (manual therapy - see below)  - standing TKE with green theraband.  - LAQ with 7.5# ankle weights 3x10 each side  Pt required multimodal cuing for proper technique and to facilitate improved neuromuscular control, strength, range of motion, and functional ability resulting in improved performance and form.  Manual therapy: to reduce pain and tissue tension, improve range of motion, neuromodulation, in order to promote improved ability to complete functional activities. - supine L LE orthopedic edema massage: flat finger circles to thigh, scooping at lower leg, and thumbcircles around patella towards incision. To encourage clearance of swelling to decrease pain and improve comfort and ROM. Pt reports it feels good and no pain during application of technique.  - supine PROM hamstring stretch L 3x30 seconds.  - gentle PROM L knee extension and flexion with gentle OP as tolerated for 3 second holds x 20.   HOME EXERCISE PROGRAM Access Code: [email or web address]        URL: [email or web address]    Date: 03/28/2019  Prepared by: Rosita Kea   Exercises Supine Quad Set - 20 reps - 5 seconds hold - 2x daily Active Straight Leg Raise with Quad Set - 3 sets - 10 reps - 1x daily Supine Heel Slide with Strap - 20 reps - 5 seconds hold - 2x daily Hamstring stretch (with strap) - 3 reps - 30 seconds hold - 2x daily    PT Education - 04/04/19 1658    Education Details  xercise purpose/form. Self management techniques    Person(s) Educated  Patient    Methods  Explanation;Demonstration;Tactile cues;Verbal cues;Handout    Comprehension  Verbalized understanding;Returned demonstration;Verbal cues required;Tactile cues required;Need further instruction       PT Short  Term Goals - 03/28/19 1413      PT SHORT TERM GOAL #1   Title  Be independent with initial home exercise program for self-management of symptoms.    Baseline  initial HEP provided at IE (03/27/2019);    Time  2    Period  Weeks    Status  New    Target Date  04/11/19        PT Long Term Goals - 03/30/19 1736      PT LONG TERM GOAL #1   Title  Be independent with a long-term home exercise program for self-management of symptoms.    Baseline  initial HEP provided at IE (03/27/2019)    Time  8    Period  Weeks    Status  Partially Met    Target Date  05/23/19      PT LONG TERM GOAL #2   Title  Demonstrate improved FOTO score by 10 units to demonstrate  improvement in overall condition and self-reported functional ability.    Baseline  FOTO = 30 (03/27/2019)    Time  8    Period  Weeks    Status  On-going    Target Date  05/23/19      PT LONG TERM GOAL #3   Title  Reduce pain with functional activities to equal or less than 1/10 to allow patient to complete usual activities including ADLs, IADLs, and social engagement with less difficulty.    Baseline  5/10 min; 12/10 max (03/27/2019)    Time  8    Period  Weeks    Status  Partially Met    Target Date  05/23/19      PT LONG TERM GOAL #4   Title  Complete community, work and/or recreational activities without limitation due to current condition.    Baseline  Functional limitations: walking, bed mobility, transfers, basic ADLs, IADLs, driving, lifting, walking dog, housework, cooking, standing, weight bearing, stairs, sewing/craftes, social activities, stepping, sleeping (03/27/2019);    Time  8    Period  Weeks    Status  Partially Met    Target Date  05/23/19      PT LONG TERM GOAL #5   Title  Demonstrated R knee extension PROM to neutral and flexion PROM to 130 degrees with less or equal to 1/10 pain at end range to increase functional mobility and reduce fall risk.    Baseline  flexion: 95, extension: -15 and limited by  pian (03/27/2019); flexion = 100 (03/30/2019);    Time  8    Period  Weeks    Status  Partially Met    Target Date  05/23/19      PT LONG TERM GOAL #6   Title  Patient will demonstrate timed get up and go (TUG) test equal or less than 11 seconds without use of BUE or AD to demonstrate improved fall risk and functional mobility.    Baseline  deferred test due to pain (03/27/2019); 24.5s with BUE support on chair and SPC with increased pain (03/30/2019);    Time  8    Period  Weeks    Status  On-going    Target Date  05/23/19            Plan - 04/04/19 1831    Clinical Impression Statement  Patient tolerated treatment well and continues to make progress towards goals, showing gains in ROM, strength, and activity tolerance. Reported decreased pain from 7/10 to 3/10 by end of session. Continues to have impairments such as decreased balance, decreased activity tolerance, weakness, stiffness that limits her functional abilities and quality of life. Patient would benefit from continued management of limiting condition by skilled physical therapist to address remaining impairments and functional limitations to work towards stated goals and return to PLOF or maximal functional independence.    Personal Factors and Comorbidities  Comorbidity 3+;Age;Past/Current Experience;Fitness;Time since onset of injury/illness/exacerbation    Comorbidities  Relevant past medical history and comorbidities include recent R unilateral knee arthroplasty completed 02/07/2019, skin cancer 2016, fibromyalgia since 2005, and pre-diabetes    Examination-Activity Limitations  Bed Mobility;Bathing;Stairs;Squat;Toileting;Sit;Bend;Caring for Others;Lift;Transfers;Dressing;Hygiene/Grooming;Sleep;Locomotion Level;Carry;Stand    Examination-Participation Restrictions  Cleaning;Laundry;Yard Work;Community Activity;Interpersonal Relationship;Driving;Meal Prep;Shop   work   [employer]  Evolving/Moderate  complexity    Rehab Potential  Good    PT Frequency  2x / week    PT Duration  8 weeks    PT Treatment/Interventions  ADLs/Self Care Home Management;Cryotherapy;Moist  Heat;Stair training;Functional mobility training;Therapeutic activities;Therapeutic exercise;Balance training;Neuromuscular re-education;Manual techniques;Dry needling;Scar mobilization;Passive range of motion;Joint Manipulations;Spinal Manipulations;Electrical Stimulation;Patient/family education;Aquatic Therapy;Gait training;DME Instruction    PT Next Visit Plan  pain control, orthopedic edema massage, strengthening and ROM as tolerated    PT Home Exercise Plan  Medbridge:[email or web address]    Consulted and Agree with Plan of Care  Patient       Patient will benefit from skilled therapeutic intervention in order to improve the following deficits and impairments:  Abnormal gait, Decreased balance, Decreased endurance, Decreased mobility, Difficulty walking, Hypomobility, Pain, Impaired flexibility, Decreased strength, Decreased activity tolerance, Decreased range of motion, Impaired perceived functional ability, Decreased skin integrity, Increased muscle spasms, Impaired sensation, Decreased scar mobility, Increased edema, Decreased knowledge of use of DME  Visit Diagnosis: Left knee pain, unspecified chronicity  Muscle weakness (generalized)  Unsteadiness on feet  Difficulty in walking, not elsewhere classified     Problem List Patient Active Problem List   Diagnosis Date Noted  . S/P left unicompartmental knee replacement 03/21/2019  . Osteoarthritis of left knee 03/09/2019  . Primary localized osteoarthritis of right knee 02/07/2019  . Status post right partial knee replacement 02/07/2019  . Neck abscess 12/23/2018  . Pre-op evaluation 12/23/2018  . Abscess 12/12/2018  . Vagina itching 07/14/2018  . Ear discharge, bilateral 07/14/2018  . Weight gain 07/14/2018  . Class 1 obesity due to excess calories without serious  comorbidity with body mass index (BMI) of 33.0 to 33.9 in adult 05/13/2018  . Prediabetes 12/03/2017  . Snoring 02/27/2016  . Non-restorative sleep 02/27/2016  . Vitamin D deficiency 10/28/2015  . Generalized anxiety disorder 11/25/2012  . HYPERTRIGLYCERIDEMIA 12/20/2008  . MENOPAUSE, EARLY 10/04/2008  . Fibromyalgia 10/04/2008    Everlean Alstrom. Graylon Good, PT, DPT 04/04/19, 6:32 PM  [facility] [address] [place], [address] Phone: [phone]   Fax:  [phone]  Name: TANISHI NAULT MRN: [id number] Date of Birth: 09/21/1955

## 2019-04-05 ENCOUNTER — Other Ambulatory Visit: Payer: Self-pay | Admitting: Family Medicine

## 2019-04-05 NOTE — Telephone Encounter (Signed)
Last office visit 12/23/2018 for hospital follow up.  Last refilled 03/02/2019 for #180 with no refills.  No future appointments.

## 2019-04-06 ENCOUNTER — Encounter: Payer: Self-pay | Admitting: Physical Therapy

## 2019-04-06 ENCOUNTER — Other Ambulatory Visit: Payer: Self-pay

## 2019-04-06 ENCOUNTER — Encounter: Payer: PRIVATE HEALTH INSURANCE | Admitting: Physical Therapy

## 2019-04-06 ENCOUNTER — Ambulatory Visit: Payer: PRIVATE HEALTH INSURANCE | Admitting: Physical Therapy

## 2019-04-06 DIAGNOSIS — M25562 Pain in left knee: Secondary | ICD-10-CM | POA: Diagnosis not present

## 2019-04-06 DIAGNOSIS — R262 Difficulty in walking, not elsewhere classified: Secondary | ICD-10-CM

## 2019-04-06 DIAGNOSIS — R2681 Unsteadiness on feet: Secondary | ICD-10-CM

## 2019-04-06 DIAGNOSIS — M6281 Muscle weakness (generalized): Secondary | ICD-10-CM

## 2019-04-06 NOTE — Therapy (Signed)
[facility] [address], [place], [address] Phone: [phone]   Fax:  [phone]  Physical Therapy Treatment  Patient Details  Name: Jillian [name] MRN: [id number] Date of Birth: September 29, 1955 Referring Provider (PT): Johnny Bridge, MD   Encounter Date: 04/06/2019  PT End of Session - 04/06/19 1847    Visit Number  4    Number of Visits  16    Date for PT Re-Evaluation  05/23/19    Authorization Type  [employer] reporting from 03/27/2019    Authorization - Visit Number  4    Authorization - Number of Visits  10    PT Start Time  7001    PT Stop Time  1915    PT Time Calculation (min)  42 min    Activity Tolerance  Patient limited by pain    Behavior During Therapy  [name] for tasks assessed/performed       Past Medical History:  Diagnosis Date  . Cancer ([place])    basal cell 2016  . Fibromyalgia   . Pre-diabetes   . Primary localized osteoarthritis of right knee 02/07/2019  . Sleep apnea    uses CPAP    Past Surgical History:  Procedure Laterality Date  . CESAREAN SECTION     times 2  . HERNIA REPAIR    . PARTIAL KNEE ARTHROPLASTY Right 02/07/2019   Procedure: UNICOMPARTMENTAL KNEE;  Surgeon: Marchia Bond, MD;  Location: [facility];  Service: Orthopedics;  Laterality: Right;  . PARTIAL KNEE ARTHROPLASTY Left 03/21/2019   Procedure: UNICOMPARTMENTAL KNEE;  Surgeon: Marchia Bond, MD;  Location: [facility];  Service: Orthopedics;  Laterality: Left;    There were no vitals filed for this visit.  Subjective Assessment - 04/06/19 1834    Subjective  Patient reports 2-3/10 pain in the anteriormedial L knee upon arrival. States yesterday was pretty rough and she did nothing because she awoke with a lot of pain. Awoke feeling better today. States she was okay following her last treamtent session until she woke up the next morning. She arrived today without her cane which she states she accidently forgot and  realized she didn't need it currently.    Pertinent History  Patient is a 64 y.o. female who presents to outpatient physical therapy with a referral for medical diagnosis s/p left uni knee arthroplasty. This patient's chief complaints consist of L knee pain, stiffness, and weakness leading to the following functional deficits: difficulty with walking, bed mobility, transfers, basic ADLs, IADLs, driving, lifting, walking dog, housework, cooking, standing, weight bearing, stairs, sewing/craftes, social activities, stepping, sleeping. Relevant past medical history and comorbidities include recent R unilateral knee arthroplasty completed 02/07/2019, skin cancer 2016, fibromyalgia since 2005, and pre-diabetes etc.; surgeries include hernia repair See more details above).    Limitations  Walking;Lifting;Standing;House hold activities;Sitting    How long can you stand comfortably?  10 mins    How long can you walk comfortably?  10 mins    Diagnostic tests  x-ray: Hemiarthroplasty medial aspect right knee.  Anatomic alignment.  L xray report post op: Satisfactory immediate postoperative appearance status post medialcompartment left knee hemiarthroplasty.    Patient Stated Goals  return to work by May 02, 2019.    Currently in Pain?  Yes    Pain Score  2     Pain Onset  In the past 7 days       OBJECTIVE:  -The incision site appearsto be healing  well with no excessive redness, warmth, drainage or signs of infection present.  - L knee AAROM: flexion = 120 degrees ERP; extension = - 5, ERP  TREATMENT: Denies sensitivity to latex  Therapeutic exercise:to centralize symptoms and improve ROM, strength, muscular endurance, and activity tolerance required for successful completion of functional activities.  - Recumbent Bike no resistance. For improved lower extremity ROM, muscular endurance, and activity tolerance; and to induce the analgesic effect of aerobic exercise, stimulate joint nutrition, and  prepare body structures and systems for following interventions. x 5  Minutes freely moving at seat setting 10, then moving seat closer every 2 minutes to level 8, 7, 6 as tolerated, able to get   (manual therapy - see below)  - Active SLR with quad set 3x10 - hooklying bridge x30 with one break. - LAQ with 7.5# ankle weights x10 R  Side, unable with or without weight on L side due to sudden pain at medial knee that resolved with rest.   Pt required multimodal cuing for proper technique and to facilitate improved neuromuscular control, strength, range of motion, and functional ability resulting in improved performance and form.  Manual therapy:to reduce pain and tissue tension, improve range of motion, neuromodulation, in order to promote improved ability to complete functional activities. - supine L LE orthopedic edema massage: flat finger circles to thigh, scooping at lower leg,  To encourage clearance of swelling to decrease pain and improve comfort and ROM. Pt reports it feels good and no pain during application of technique.  - supine PROM hamstring stretch L 3x30 seconds.  - gentle PROM L knee extension and flexion with gentle OP as tolerated for 3 second holds x 20.  HOME EXERCISE PROGRAM Access Code: [email or web address] URL: [email or web address] Date: 03/28/2019  Prepared by: Rosita Kea   Exercises Supine Quad Set - 20 reps - 5 seconds hold - 2x daily Active Straight Leg Raise with Quad Set - 3 sets - 10 reps - 1x daily Supine Heel Slide with Strap - 20 reps - 5 seconds hold - 2x daily Hamstring stretch (with strap) - 3 reps - 30 seconds hold - 2x daily    PT Education - 04/06/19 1926    Education Details  exercise purpose/form. Self management techniques    Person(s) Educated  Patient    Methods  Explanation;Demonstration;Tactile cues;Verbal cues    Comprehension  Verbalized understanding;Returned demonstration;Verbal cues required;Tactile cues  required;Need further instruction       PT Short Term Goals - 03/28/19 1413      PT SHORT TERM GOAL #1   Title  Be independent with initial home exercise program for self-management of symptoms.    Baseline  initial HEP provided at IE (03/27/2019);    Time  2    Period  Weeks    Status  New    Target Date  04/11/19        PT Long Term Goals - 03/30/19 1736      PT LONG TERM GOAL #1   Title  Be independent with a long-term home exercise program for self-management of symptoms.    Baseline  initial HEP provided at IE (03/27/2019)    Time  8    Period  Weeks    Status  Partially Met    Target Date  05/23/19      PT LONG TERM GOAL #2   Title  Demonstrate improved FOTO score by 10 units to demonstrate improvement in overall condition and self-reported functional ability.  Baseline  FOTO = 30 (03/27/2019)    Time  8    Period  Weeks    Status  On-going    Target Date  05/23/19      PT LONG TERM GOAL #3   Title  Reduce pain with functional activities to equal or less than 1/10 to allow patient to complete usual activities including ADLs, IADLs, and social engagement with less difficulty.    Baseline  5/10 min; 12/10 max (03/27/2019)    Time  8    Period  Weeks    Status  Partially Met    Target Date  05/23/19      PT LONG TERM GOAL #4   Title  Complete community, work and/or recreational activities without limitation due to current condition.    Baseline  Functional limitations: walking, bed mobility, transfers, basic ADLs, IADLs, driving, lifting, walking dog, housework, cooking, standing, weight bearing, stairs, sewing/craftes, social activities, stepping, sleeping (03/27/2019);    Time  8    Period  Weeks    Status  Partially Met    Target Date  05/23/19      PT LONG TERM GOAL #5   Title  Demonstrated R knee extension PROM to neutral and flexion PROM to 130 degrees with less or equal to 1/10 pain at end range to increase functional mobility and reduce fall risk.     Baseline  flexion: 95, extension: -15 and limited by pian (03/27/2019); flexion = 100 (03/30/2019);    Time  8    Period  Weeks    Status  Partially Met    Target Date  05/23/19      PT LONG TERM GOAL #6   Title  Patient will demonstrate timed get up and go (TUG) test equal or less than 11 seconds without use of BUE or AD to demonstrate improved fall risk and functional mobility.    Baseline  deferred test due to pain (03/27/2019); 24.5s with BUE support on chair and SPC with increased pain (03/30/2019);    Time  8    Period  Weeks    Status  On-going    Target Date  05/23/19            Plan - 04/06/19 1925    Clinical Impression Statement  Patient tolerated treatment well overall and continues to make excellent progress towards goals. She reported limiting end range pain in extension and flexion but is nearing goal PROM. She had sudden pain at medial L knee when attempting LAQ at end of session that decreased with rest. She felt it again when weight bearing and she had to sit down, but stated it did not feel like a buckle. Walked out to vehicle with no further episodes. SBA during ambulation to vehicle for safety. Patient is not the driver. Patient would benefit from continued management of limiting condition by skilled physical therapist to address remaining impairments and functional limitations to work towards stated goals and return to PLOF or maximal functional independence.    Personal Factors and Comorbidities  Comorbidity 3+;Age;Past/Current Experience;Fitness;Time since onset of injury/illness/exacerbation    Comorbidities  Relevant past medical history and comorbidities include recent R unilateral knee arthroplasty completed 02/07/2019, skin cancer 2016, fibromyalgia since 2005, and pre-diabetes    Examination-Activity Limitations  Bed Mobility;Bathing;Stairs;Squat;Toileting;Sit;Bend;Caring for Others;Lift;Transfers;Dressing;Hygiene/Grooming;Sleep;Locomotion Level;Carry;Stand     Examination-Participation Restrictions  Cleaning;Laundry;Yard Work;Community Activity;Interpersonal Relationship;Driving;Meal Prep;Shop   work   Garment/textile technologist  PT Frequency  2x / week    PT Duration  8 weeks    PT Treatment/Interventions  ADLs/Self Care Home Management;Cryotherapy;Moist Heat;Stair training;Functional mobility training;Therapeutic activities;Therapeutic exercise;Balance training;Neuromuscular re-education;Manual techniques;Dry needling;Scar mobilization;Passive range of motion;Joint Manipulations;Spinal Manipulations;Electrical Stimulation;Patient/family education;Aquatic Therapy;Gait training;DME Instruction    PT Next Visit Plan  pain control, orthopedic edema massage, strengthening and ROM as tolerated    PT Home Exercise Plan  Medbridge:[email or web address]    Consulted and Agree with Plan of Care  Patient       Patient will benefit from skilled therapeutic intervention in order to improve the following deficits and impairments:  Abnormal gait, Decreased balance, Decreased endurance, Decreased mobility, Difficulty walking, Hypomobility, Pain, Impaired flexibility, Decreased strength, Decreased activity tolerance, Decreased range of motion, Impaired perceived functional ability, Decreased skin integrity, Increased muscle spasms, Impaired sensation, Decreased scar mobility, Increased edema, Decreased knowledge of use of DME  Visit Diagnosis: Left knee pain, unspecified chronicity  Muscle weakness (generalized)  Unsteadiness on feet  Difficulty in walking, not elsewhere classified     Problem List Patient Active Problem List   Diagnosis Date Noted  . S/P left unicompartmental knee replacement 03/21/2019  . Osteoarthritis of left knee 03/09/2019  . Primary localized osteoarthritis of right knee 02/07/2019  . Status post right partial knee replacement 02/07/2019  . Neck abscess 12/23/2018  . Pre-op  evaluation 12/23/2018  . Abscess 12/12/2018  . Vagina itching 07/14/2018  . Ear discharge, bilateral 07/14/2018  . Weight gain 07/14/2018  . Class 1 obesity due to excess calories without serious comorbidity with body mass index (BMI) of 33.0 to 33.9 in adult 05/13/2018  . Prediabetes 12/03/2017  . Snoring 02/27/2016  . Non-restorative sleep 02/27/2016  . Vitamin D deficiency 10/28/2015  . Generalized anxiety disorder 11/25/2012  . HYPERTRIGLYCERIDEMIA 12/20/2008  . MENOPAUSE, EARLY 10/04/2008  . Fibromyalgia 10/04/2008    Everlean Alstrom. Graylon Good, PT, DPT 04/06/19, 7:27 PM  [facility] [address] [place], [address] Phone: [phone]   Fax:  [phone]  Name: Jillian [name] MRN: [id number] Date of Birth: 02/04/1956

## 2019-04-10 ENCOUNTER — Other Ambulatory Visit: Payer: Self-pay

## 2019-04-10 ENCOUNTER — Encounter: Payer: Self-pay | Admitting: Physical Therapy

## 2019-04-10 ENCOUNTER — Other Ambulatory Visit: Payer: Self-pay | Admitting: Family Medicine

## 2019-04-10 ENCOUNTER — Ambulatory Visit: Payer: PRIVATE HEALTH INSURANCE | Admitting: Physical Therapy

## 2019-04-10 DIAGNOSIS — R2681 Unsteadiness on feet: Secondary | ICD-10-CM

## 2019-04-10 DIAGNOSIS — M25562 Pain in left knee: Secondary | ICD-10-CM | POA: Diagnosis not present

## 2019-04-10 DIAGNOSIS — R262 Difficulty in walking, not elsewhere classified: Secondary | ICD-10-CM

## 2019-04-10 DIAGNOSIS — M6281 Muscle weakness (generalized): Secondary | ICD-10-CM

## 2019-04-10 NOTE — Therapy (Signed)
[facility] [address], [place], [address] Phone: [phone]   Fax:  [phone]  Physical Therapy Treatment  Patient Details  Name: Jillian [name] MRN: [id number] Date of Birth: October 19, 1955 Referring Provider (PT): Johnny Bridge, MD   Encounter Date: 04/10/2019  PT End of Session - 04/10/19 1806    Visit Number  5    Number of Visits  16    Date for PT Re-Evaluation  05/23/19    Authorization Type  [employer] reporting from 03/27/2019    Authorization - Visit Number  5    Authorization - Number of Visits  10    PT Start Time  1800    PT Stop Time  1845    PT Time Calculation (min)  45 min    Activity Tolerance  Patient limited by pain    Behavior During Therapy  [facility] for tasks assessed/performed       Past Medical History:  Diagnosis Date  . Cancer ([place])    basal cell 2016  . Fibromyalgia   . Pre-diabetes   . Primary localized osteoarthritis of right knee 02/07/2019  . Sleep apnea    uses CPAP    Past Surgical History:  Procedure Laterality Date  . CESAREAN SECTION     times 2  . HERNIA REPAIR    . PARTIAL KNEE ARTHROPLASTY Right 02/07/2019   Procedure: UNICOMPARTMENTAL KNEE;  Surgeon: Marchia Bond, MD;  Location: [facility];  Service: Orthopedics;  Laterality: Right;  . PARTIAL KNEE ARTHROPLASTY Left 03/21/2019   Procedure: UNICOMPARTMENTAL KNEE;  Surgeon: Marchia Bond, MD;  Location: [facility];  Service: Orthopedics;  Laterality: Left;    There were no vitals filed for this visit.  Subjective Assessment - 04/10/19 1802    Subjective  [name] reports she is having 4/10 pain in the spot just medial to the L patella that hurt so much at the end of last treatment session. She states it was really sore there following last session and is gradually getting better. she was able to get her christmast tree down prior to arrival today. She needed help to get it out in the garage. She has not been  using her cane and has been going up and down the stairs at home using both legs. She has made it down a number of time using step to pattern on the way down.    Pertinent History  Patient is a 64 y.o. female who presents to outpatient physical therapy with a referral for medical diagnosis s/p left uni knee arthroplasty. This patient's chief complaints consist of L knee pain, stiffness, and weakness leading to the following functional deficits: difficulty with walking, bed mobility, transfers, basic ADLs, IADLs, driving, lifting, walking dog, housework, cooking, standing, weight bearing, stairs, sewing/craftes, social activities, stepping, sleeping. Relevant past medical history and comorbidities include recent R unilateral knee arthroplasty completed 02/07/2019, skin cancer 2016, fibromyalgia since 2005, and pre-diabetes etc.; surgeries include hernia repair See more details above).    Limitations  Walking;Lifting;Standing;House hold activities;Sitting    How long can you stand comfortably?  10 mins    How long can you walk comfortably?  10 mins    Diagnostic tests  x-ray: Hemiarthroplasty medial aspect right knee.  Anatomic alignment.  L xray report post op: Satisfactory immediate postoperative appearance status post medialcompartment left knee hemiarthroplasty. Reports her HEP is going okay - she hasn't done as much of them, but has been more active  at home in general. Still using ice, especially between 2-3 am.    Patient Stated Goals  return to work by May 02, 2019.    Currently in Pain?  Yes    Pain Score  4     Pain Location  Knee    Pain Orientation  Left;Anterior;Medial    Pain Descriptors / Indicators  --   "raw flesh" feeling   Pain Type  Surgical pain    Pain Onset  In the past 7 days        OBJECTIVE:  -The incision site appearsto be healing well with no excessive redness, warmth, drainage or signs of infection present.  - L knee AAROM: flexion = 120degrees ERP; extension = - 5,  ERP  TREATMENT: Denies sensitivity to latex  Therapeutic exercise:to centralize symptoms and improve ROM, strength, muscular endurance, and activity tolerance required for successful completion of functional activities.  -Recumbent Bikeno resistance. For improved lower extremity ROM, muscular endurance, and activity tolerance; and to induce the analgesic effect of aerobic exercise, stimulate joint nutrition, and prepare body structures and systems for following interventions. x 6Minutes freely moving at seat setting 10, then moving seat closer every 2 minutes to level 9, 8, 7 (1 minute) as tolerated.   (manual therapy - see below)  - L quad set with back of knee unsupported, x15 - LAQ with 5# (L) 10# (R) ankle weights. L tibia internally rotated to tolerate better.  3x10 each side.  - seated hamstring curl against black (R), red (L) 3x10.  Neuromuscular Re-education: to improve, balance, postural strength, muscle activation patterns, and stabilization strength required for functional activities: - semi-tandem stance balance on airex with touch down UE support as needed (1-2 per set) x 1 min each side.  - ball toss with 2kg med ball: self selected stance until feels sturdy; narrow stance on airex x 30 seconds. Semi-tandem stance x 1 min each side. SBA for safety.   Pt required multimodal cuing for proper technique and to facilitate improved neuromuscular control, strength, range of motion, and functional ability resulting in improved performance and form.  Manual therapy:to reduce pain and tissue tension, improve range of motion, neuromodulation, in order to promote improved ability to complete functional activities. - supine L LE orthopedic edema massage: flat finger circles to thigh, scooping at lower leg,  thumb circlesTo encourage clearance of swelling to decrease pain and improve comfort and ROM. Pt reports it feels mild discomfort even with light touch.  - left patellar  mobs grade II-III medial, superior, inferior. PROM lateral (tender).    HOME EXERCISE PROGRAM Access Code: [email or web address] URL: [email or web address] Date: 03/28/2019  Prepared by: Rosita Kea   Exercises Supine Quad Set - 20 reps - 5 seconds hold - 2x daily Active Straight Leg Raise with Quad Set - 3 sets - 10 reps - 1x daily Supine Heel Slide with Strap - 20 reps - 5 seconds hold - 2x daily Hamstring stretch (with strap) - 3 reps - 30 seconds hold - 2x daily   PT Education - 04/10/19 1805    Education Details  exercise purpose/form. Self management techniques    Person(s) Educated  Patient    Methods  Explanation;Demonstration;Tactile cues;Verbal cues    Comprehension  Verbalized understanding;Returned demonstration;Verbal cues required;Tactile cues required;Need further instruction       PT Short Term Goals - 04/10/19 1809      PT SHORT TERM GOAL #1   Title  Be independent with initial home exercise program  for self-management of symptoms.    Baseline  initial HEP provided at IE (03/27/2019);    Time  2    Period  Weeks    Status  Achieved    Target Date  04/11/19        PT Long Term Goals - 03/30/19 1736      PT LONG TERM GOAL #1   Title  Be independent with a long-term home exercise program for self-management of symptoms.    Baseline  initial HEP provided at [facility] (03/27/2019)    Time  8    Period  Weeks    Status  Partially Met    Target Date  05/23/19      PT LONG TERM GOAL #2   Title  Demonstrate improved FOTO score by 10 units to demonstrate improvement in overall condition and self-reported functional ability.    Baseline  FOTO = 30 (03/27/2019)    Time  8    Period  Weeks    Status  On-going    Target Date  05/23/19      PT LONG TERM GOAL #3   Title  Reduce pain with functional activities to equal or less than 1/10 to allow patient to complete usual activities including ADLs, IADLs, and social engagement with less difficulty.     Baseline  5/10 min; 12/10 max (03/27/2019)    Time  8    Period  Weeks    Status  Partially Met    Target Date  05/23/19      PT LONG TERM GOAL #4   Title  Complete community, work and/or recreational activities without limitation due to current condition.    Baseline  Functional limitations: walking, bed mobility, transfers, basic ADLs, IADLs, driving, lifting, walking dog, housework, cooking, standing, weight bearing, stairs, sewing/craftes, social activities, stepping, sleeping (03/27/2019);    Time  8    Period  Weeks    Status  Partially Met    Target Date  05/23/19      PT LONG TERM GOAL #5   Title  Demonstrated R knee extension PROM to neutral and flexion PROM to 130 degrees with less or equal to 1/10 pain at end range to increase functional mobility and reduce fall risk.    Baseline  flexion: 95, extension: -15 and limited by pian (03/27/2019); flexion = 100 (03/30/2019);    Time  8    Period  Weeks    Status  Partially Met    Target Date  05/23/19      PT LONG TERM GOAL #6   Title  Patient will demonstrate timed get up and go (TUG) test equal or less than 11 seconds without use of BUE or AD to demonstrate improved fall risk and functional mobility.    Baseline  deferred test due to pain (03/27/2019); 24.5s with BUE support on chair and SPC with increased pain (03/30/2019);    Time  8    Period  Weeks    Status  On-going    Target Date  05/23/19            Plan - 04/10/19 1905    Clinical Impression Statement  Patient tolerated treatment well overall but did have some limitations due to L medial knee pain that was worse with extension against resistance, palpation, and stretching into flexion or extension. Reoported no overall change in pain by end of session. Modified exercises to accommodate and was less aggressive with stretching this session. Patient continues to  report improved function at home but is still limited by impairments including pain and limitations in  strength, power, endurance, ROM and balance. She has not returned to PLOF at this point.Patient would benefit from continued management of limiting condition by skilled physical therapist to address remaining impairments and functional limitations to work towards stated goals and return to PLOF or maximal functional independence    Personal Factors and Comorbidities  Comorbidity 3+;Age;Past/Current Experience;Fitness;Time since onset of injury/illness/exacerbation    Comorbidities  Relevant past medical history and comorbidities include recent R unilateral knee arthroplasty completed 02/07/2019, skin cancer 2016, fibromyalgia since 2005, and pre-diabetes    Examination-Activity Limitations  Bed Mobility;Bathing;Stairs;Squat;Toileting;Sit;Bend;Caring for Others;Lift;Transfers;Dressing;Hygiene/Grooming;Sleep;Locomotion Level;Carry;Stand    Examination-Participation Restrictions  Cleaning;Laundry;Yard Work;Community Activity;Interpersonal Relationship;Driving;Meal Prep;Shop   work   [employer]  Evolving/Moderate complexity    Rehab Potential  Good    PT Frequency  2x / week    PT Duration  8 weeks    PT Treatment/Interventions  ADLs/Self Care Home Management;Cryotherapy;Moist Heat;Stair training;Functional mobility training;Therapeutic activities;Therapeutic exercise;Balance training;Neuromuscular re-education;Manual techniques;Dry needling;Scar mobilization;Passive range of motion;Joint Manipulations;Spinal Manipulations;Electrical Stimulation;Patient/family education;Aquatic Therapy;Gait training;DME Instruction    PT Next Visit Plan  pain control, orthopedic edema massage, strengthening and ROM as tolerated    PT Home Exercise Plan  Medbridge:[email or web address]    Consulted and Agree with Plan of Care  Patient       Patient will benefit from skilled therapeutic intervention in order to improve the following deficits and impairments:  Abnormal gait, Decreased balance, Decreased  endurance, Decreased mobility, Difficulty walking, Hypomobility, Pain, Impaired flexibility, Decreased strength, Decreased activity tolerance, Decreased range of motion, Impaired perceived functional ability, Decreased skin integrity, Increased muscle spasms, Impaired sensation, Decreased scar mobility, Increased edema, Decreased knowledge of use of DME  Visit Diagnosis: Left knee pain, unspecified chronicity  Muscle weakness (generalized)  Unsteadiness on feet  Difficulty in walking, not elsewhere classified     Problem List Patient Active Problem List   Diagnosis Date Noted  . S/P left unicompartmental knee replacement 03/21/2019  . Osteoarthritis of left knee 03/09/2019  . Primary localized osteoarthritis of right knee 02/07/2019  . Status post right partial knee replacement 02/07/2019  . Neck abscess 12/23/2018  . Pre-op evaluation 12/23/2018  . Abscess 12/12/2018  . Vagina itching 07/14/2018  . Ear discharge, bilateral 07/14/2018  . Weight gain 07/14/2018  . Class 1 obesity due to excess calories without serious comorbidity with body mass index (BMI) of 33.0 to 33.9 in adult 05/13/2018  . Prediabetes 12/03/2017  . Snoring 02/27/2016  . Non-restorative sleep 02/27/2016  . Vitamin D deficiency 10/28/2015  . Generalized anxiety disorder 11/25/2012  . HYPERTRIGLYCERIDEMIA 12/20/2008  . MENOPAUSE, EARLY 10/04/2008  . Fibromyalgia 10/04/2008    Everlean Alstrom. Graylon Good, PT, DPT 04/10/19, 7:06 PM  [facility] [address] [place], [address] Phone: [phone]   Fax:  [phone]  Name: SUSANN LAWHORNE MRN: [id number] Date of Birth: Sep 09, 1955

## 2019-04-10 NOTE — Telephone Encounter (Signed)
Last office visit 03/10/2019 for CPE.   Last refilled 02/15/2019 for #90 with 1 refill.  No future appointments.

## 2019-04-11 ENCOUNTER — Encounter: Payer: Self-pay | Admitting: Physical Therapy

## 2019-04-11 ENCOUNTER — Ambulatory Visit: Payer: PRIVATE HEALTH INSURANCE | Admitting: Physical Therapy

## 2019-04-11 DIAGNOSIS — R262 Difficulty in walking, not elsewhere classified: Secondary | ICD-10-CM

## 2019-04-11 DIAGNOSIS — R2681 Unsteadiness on feet: Secondary | ICD-10-CM

## 2019-04-11 DIAGNOSIS — M25562 Pain in left knee: Secondary | ICD-10-CM | POA: Diagnosis not present

## 2019-04-11 DIAGNOSIS — M6281 Muscle weakness (generalized): Secondary | ICD-10-CM

## 2019-04-11 NOTE — Therapy (Signed)
[place] [facility] [address], [place], [address] Phone: [phone]   Fax:  [phone]  Physical Therapy Treatment  Patient Details  Name: Jillian [name] MRN: [id number] Date of Birth: May 05, 1955 Referring Provider (PT): Johnny Bridge, MD   Encounter Date: 04/11/2019  PT End of Session - 04/11/19 1350    Visit Number  6    Number of Visits  16    Date for PT Re-Evaluation  05/23/19    Authorization Type  [employer] reporting from 03/27/2019    Authorization - Visit Number  6    Authorization - Number of Visits  10    PT Start Time  5520    PT Stop Time  1428    PT Time Calculation (min)  39 min    Activity Tolerance  Patient limited by pain    Behavior During Therapy  [facility] for tasks assessed/performed       Past Medical History:  Diagnosis Date  . Cancer ([place])    basal cell 2016  . Fibromyalgia   . Pre-diabetes   . Primary localized osteoarthritis of right knee 02/07/2019  . Sleep apnea    uses CPAP    Past Surgical History:  Procedure Laterality Date  . CESAREAN SECTION     times 2  . HERNIA REPAIR    . PARTIAL KNEE ARTHROPLASTY Right 02/07/2019   Procedure: UNICOMPARTMENTAL KNEE;  Surgeon: Marchia Bond, MD;  Location: [facility];  Service: Orthopedics;  Laterality: Right;  . PARTIAL KNEE ARTHROPLASTY Left 03/21/2019   Procedure: UNICOMPARTMENTAL KNEE;  Surgeon: Marchia Bond, MD;  Location: [facility];  Service: Orthopedics;  Laterality: Left;    There were no vitals filed for this visit.  Subjective Assessment - 04/11/19 1352    Subjective  Patient reports she has about 4/10 pain in the medial aspect of the L knee as before. She states she had some difficulty sleeping and sort of wishes she came tomorrow instead of today but she is motivated to continue working today.    Pertinent History  Patient is a 64 y.o. female who presents to outpatient physical therapy with a referral for medical diagnosis  s/p left uni knee arthroplasty. This patient's chief complaints consist of L knee pain, stiffness, and weakness leading to the following functional deficits: difficulty with walking, bed mobility, transfers, basic ADLs, IADLs, driving, lifting, walking dog, housework, cooking, standing, weight bearing, stairs, sewing/craftes, social activities, stepping, sleeping. Relevant past medical history and comorbidities include recent R unilateral knee arthroplasty completed 02/07/2019, skin cancer 2016, fibromyalgia since 2005, and pre-diabetes etc.; surgeries include hernia repair See more details above).    Limitations  Walking;Lifting;Standing;House hold activities;Sitting    How long can you stand comfortably?  10 mins    How long can you walk comfortably?  10 mins    Diagnostic tests  x-ray: Hemiarthroplasty medial aspect right knee.  Anatomic alignment.  L xray report post op: Satisfactory immediate postoperative appearance status post medialcompartment left knee hemiarthroplasty. Reports her HEP is going okay - she hasn't done as much of them, but has been more active at home in general. Still using ice, especially between 2-3 am.    Patient Stated Goals  return to work by May 02, 2019.    Currently in Pain?  Yes    Pain Score  4     Pain Location  Knee    Pain Orientation  Left    Pain Onset  In the past 7 days       OBJECTIVE:  -The incision site appearsto be healing well with no excessive redness, warmth, drainage or signs of infection present.  - L knee AAROM: flexion =120degrees ERP; extension = - 5, ERP  TREATMENT: Denies sensitivity to latex  Therapeutic exercise:to centralize symptoms and improve ROM, strength, muscular endurance, and activity tolerance required for successful completion of functional activities.  -Recumbent Bikeno resistance. For improved lower extremity ROM, muscular endurance, and activity tolerance; and to induce the analgesic effect of aerobic exercise,  stimulate joint nutrition, and prepare body structures and systems for following interventions. x 6Minutesfreely moving at seat setting 9, then moving seat closer every 2 minutes to level  8, 7, 6, 5 (1.25  minutes) as tolerated. Measured 123 degrees flexion following.   (manual therapy - see below)  - L quad set with back of knee unsupported, 2x10 - LAQ with 7.5# (L) 15# (R) ankle weights. L tibia internally rotated to tolerate better.  3x10 each side.   - seated hamstring curl against black (R), green (L) 3x10. - Side stepping with green theraband wrapped around distal thigh to activate core and hip stabilizers and abductors in functional weightbearing position. Cuing to activate abdominals. Stepping a few steps out and in as allowed by band 2x20 feet each side.   Pt required multimodal cuing for proper technique and to facilitate improved neuromuscular control, strength, range of motion, and functional ability resulting in improved performance and form.  Manual therapy:to reduce pain and tissue tension, improve range of motion, neuromodulation, in order to promote improved ability to complete functional activities. - supine L hamstiring  PROM stretch to improve knee extension, 3x30-40 seconds each.   HOME EXERCISE PROGRAM Access Code: [email or web address] URL: [email or web address] Date: 03/28/2019  Prepared by: Janila Arrazola   Exercises Supine Quad Set - 20 reps - 5 seconds hold - 2x daily Active Straight Leg Raise with Quad Set - 3 sets - 10 reps - 1x daily Supine Heel Slide with Strap - 20 reps - 5 seconds hold - 2x daily Hamstring stretch (with strap) - 3 reps - 30 seconds hold - 2x daily    PT Education - 04/11/19 1350    Education Details  exercise purpose/form. Self management techniques    Person(s) Educated  Patient    Methods  Explanation;Demonstration;Tactile cues;Verbal cues    Comprehension  Verbalized understanding;Returned demonstration;Verbal  cues required;Tactile cues required       PT Short Term Goals - 04/10/19 1809      PT SHORT TERM GOAL #1   Title  Be independent with initial home exercise program for self-management of symptoms.    Baseline  initial HEP provided at IE (03/27/2019);    Time  2    Period  Weeks    Status  Achieved    Target Date  04/11/19        PT Long Term Goals - 03/30/19 1736      PT LONG TERM GOAL #1   Title  Be independent with a long-term home exercise program for self-management of symptoms.    Baseline  initial HEP provided at IE (03/27/2019)    Time  8    Period  Weeks    Status  Partially Met    Target Date  05/23/19      PT LONG TERM GOAL #2   Title  Demonstrate improved FOTO score by 10 units to demonstrate improvement in overall condition and self-reported functional   ability.    Baseline  FOTO = 30 (03/27/2019)    Time  8    Period  Weeks    Status  On-going    Target Date  05/23/19      PT LONG TERM GOAL #3   Title  Reduce pain with functional activities to equal or less than 1/10 to allow patient to complete usual activities including ADLs, IADLs, and social engagement with less difficulty.    Baseline  5/10 min; 12/10 max (03/27/2019)    Time  8    Period  Weeks    Status  Partially Met    Target Date  05/23/19      PT LONG TERM GOAL #4   Title  Complete community, work and/or recreational activities without limitation due to current condition.    Baseline  Functional limitations: walking, bed mobility, transfers, basic ADLs, IADLs, driving, lifting, walking dog, housework, cooking, standing, weight bearing, stairs, sewing/craftes, social activities, stepping, sleeping (03/27/2019);    Time  8    Period  Weeks    Status  Partially Met    Target Date  05/23/19      PT LONG TERM GOAL #5   Title  Demonstrated R knee extension PROM to neutral and flexion PROM to 130 degrees with less or equal to 1/10 pain at end range to increase functional mobility and reduce fall  risk.    Baseline  flexion: 95, extension: -15 and limited by pian (03/27/2019); flexion = 100 (03/30/2019);    Time  8    Period  Weeks    Status  Partially Met    Target Date  05/23/19      PT LONG TERM GOAL #6   Title  Patient will demonstrate timed get up and go (TUG) test equal or less than 11 seconds without use of BUE or AD to demonstrate improved fall risk and functional mobility.    Baseline  deferred test due to pain (03/27/2019); 24.5s with BUE support on chair and SPC with increased pain (03/30/2019);    Time  8    Period  Weeks    Status  On-going    Target Date  05/23/19            Plan - 04/11/19 1640    Clinical Impression Statement  Patient tolerated treatment well today and continues to make progress towards goals. Patient was able to tolerate gentle increases in resistance with strengthening as well as move up to seat position 5 on the recumbent bike. Patient continued to be somewhat limited by pain at the L medial knee, so continued to modify exercises as needed to keep pain at tolerable level. Patient reported slight reduction in pain to 3-4/10 by end of session. Patient continues to have significant impairments and functional limitations and has not returned to PLOF. Patient would benefit from continued management of limiting condition by skilled physical therapist to address remaining impairments and functional limitations to work towards stated goals and return to PLOF or maximal functional independence.    Personal Factors and Comorbidities  Comorbidity 3+;Age;Past/Current Experience;Fitness;Time since onset of injury/illness/exacerbation    Comorbidities  Relevant past medical history and comorbidities include recent R unilateral knee arthroplasty completed 02/07/2019, skin cancer 2016, fibromyalgia since 2005, and pre-diabetes    Examination-Activity Limitations  Bed Mobility;Bathing;Stairs;Squat;Toileting;Sit;Bend;Caring for  Others;Lift;Transfers;Dressing;Hygiene/Grooming;Sleep;Locomotion Level;Carry;Stand    Examination-Participation Restrictions  Cleaning;Laundry;Yard Work;Community Activity;Interpersonal Relationship;Driving;Meal Prep;Shop   work   Stability/Clinical Decision Making  Evolving/Moderate complexity    Rehab   Potential  Good    PT Frequency  2x / week    PT Duration  8 weeks    PT Treatment/Interventions  ADLs/Self Care Home Management;Cryotherapy;Moist Heat;Stair training;Functional mobility training;Therapeutic activities;Therapeutic exercise;Balance training;Neuromuscular re-education;Manual techniques;Dry needling;Scar mobilization;Passive range of motion;Joint Manipulations;Spinal Manipulations;Electrical Stimulation;Patient/family education;Aquatic Therapy;Gait training;DME Instruction    PT Next Visit Plan  pain control, strengthening and ROM as tolerated    PT Home Exercise Plan  Medbridge:[email or web address]    Consulted and Agree with Plan of Care  Patient       Patient will benefit from skilled therapeutic intervention in order to improve the following deficits and impairments:  Abnormal gait, Decreased balance, Decreased endurance, Decreased mobility, Difficulty walking, Hypomobility, Pain, Impaired flexibility, Decreased strength, Decreased activity tolerance, Decreased range of motion, Impaired perceived functional ability, Decreased skin integrity, Increased muscle spasms, Impaired sensation, Decreased scar mobility, Increased edema, Decreased knowledge of use of DME  Visit Diagnosis: Left knee pain, unspecified chronicity  Muscle weakness (generalized)  Unsteadiness on feet  Difficulty in walking, not elsewhere classified     Problem List Patient Active Problem List   Diagnosis Date Noted  . S/P left unicompartmental knee replacement 03/21/2019  . Osteoarthritis of left knee 03/09/2019  . Primary localized osteoarthritis of right knee 02/07/2019  . Status post right partial knee  replacement 02/07/2019  . Neck abscess 12/23/2018  . Pre-op evaluation 12/23/2018  . Abscess 12/12/2018  . Vagina itching 07/14/2018  . Ear discharge, bilateral 07/14/2018  . Weight gain 07/14/2018  . Class 1 obesity due to excess calories without serious comorbidity with body mass index (BMI) of 33.0 to 33.9 in adult 05/13/2018  . Prediabetes 12/03/2017  . Snoring 02/27/2016  . Non-restorative sleep 02/27/2016  . Vitamin D deficiency 10/28/2015  . Generalized anxiety disorder 11/25/2012  . HYPERTRIGLYCERIDEMIA 12/20/2008  . MENOPAUSE, EARLY 10/04/2008  . Fibromyalgia 10/04/2008    Sara R. Snyder, PT, DPT 04/11/19, 4:42 PM  [facility] [address] [place], [place], [address] Phone: [phone]   Fax:  [phone]  Name: Cannon M Neuwirth MRN: [id number] Date of Birth: 06/09/1955   

## 2019-04-12 DIAGNOSIS — G4733 Obstructive sleep apnea (adult) (pediatric): Secondary | ICD-10-CM | POA: Insufficient documentation

## 2019-04-12 NOTE — Assessment & Plan Note (Signed)
Now on CPAP. Followed by pulmonary.

## 2019-04-12 NOTE — Assessment & Plan Note (Signed)
Not ideally controlled despite current regimen. Pt not interested in med change at this time.  Encouraged getting back to regular exercise.

## 2019-04-12 NOTE — Assessment & Plan Note (Signed)
stable control Encouraged exercise, weight loss, healthy eating habits.

## 2019-04-12 NOTE — Assessment & Plan Note (Signed)
Replete

## 2019-04-17 ENCOUNTER — Encounter: Payer: Self-pay | Admitting: Physical Therapy

## 2019-04-17 ENCOUNTER — Other Ambulatory Visit: Payer: Self-pay

## 2019-04-17 ENCOUNTER — Ambulatory Visit: Payer: PRIVATE HEALTH INSURANCE | Admitting: Physical Therapy

## 2019-04-17 DIAGNOSIS — M25562 Pain in left knee: Secondary | ICD-10-CM | POA: Diagnosis not present

## 2019-04-17 DIAGNOSIS — M6281 Muscle weakness (generalized): Secondary | ICD-10-CM

## 2019-04-17 DIAGNOSIS — R2681 Unsteadiness on feet: Secondary | ICD-10-CM

## 2019-04-17 DIAGNOSIS — R262 Difficulty in walking, not elsewhere classified: Secondary | ICD-10-CM

## 2019-04-17 NOTE — Therapy (Signed)
[facility] [address], [place], [address] Phone: [phone]   Fax:  [phone]  Physical Therapy Treatment  Patient Details  Name: Jillian [name] MRN: [id number] Date of Birth: January 08, 1956 Referring Provider (PT): Johnny Bridge, MD   Encounter Date: 04/17/2019  PT End of Session - 04/17/19 1659    Visit Number  7    Number of Visits  16    Date for PT Re-Evaluation  05/23/19    Authorization Type  [employer]ST reporting from 03/27/2019    Authorization - Visit Number  7    Authorization - Number of Visits  10    PT Start Time  8341    PT Stop Time  1725    PT Time Calculation (min)  40 min    Activity Tolerance  Patient limited by pain    Behavior During Therapy  [facility] for tasks assessed/performed       Past Medical History:  Diagnosis Date  . Cancer ([place])    basal cell 2016  . Fibromyalgia   . Pre-diabetes   . Primary localized osteoarthritis of right knee 02/07/2019  . Sleep apnea    uses CPAP    Past Surgical History:  Procedure Laterality Date  . CESAREAN SECTION     times 2  . HERNIA REPAIR    . PARTIAL KNEE ARTHROPLASTY Right 02/07/2019   Procedure: UNICOMPARTMENTAL KNEE;  Surgeon: Marchia Bond, MD;  Location: [facility];  Service: Orthopedics;  Laterality: Right;  . PARTIAL KNEE ARTHROPLASTY Left 03/21/2019   Procedure: UNICOMPARTMENTAL KNEE;  Surgeon: Marchia Bond, MD;  Location: [facility];  Service: Orthopedics;  Laterality: Left;    There were no vitals filed for this visit.  Subjective Assessment - 04/17/19 1650    Subjective  Patient reports she is feeling pretty good today, but she does continue to have pain at the medial L patella, especially when she goes down stairs step over step (which she has been doing). She has an infection on the R side of her neck that is covered. She had a televisit for it and the doctor thought it might be MRSA but she has tested negative in the past. It  covered and not draining. She is picking up bactrim for it and has also notified her orthopedic doctor about it. States she sees no sign of infection at her knee incision.    Pertinent History  Patient is a 64 y.o. female who presents to outpatient physical therapy with a referral for medical diagnosis s/p left uni knee arthroplasty. This patient's chief complaints consist of L knee pain, stiffness, and weakness leading to the following functional deficits: difficulty with walking, bed mobility, transfers, basic ADLs, IADLs, driving, lifting, walking dog, housework, cooking, standing, weight bearing, stairs, sewing/craftes, social activities, stepping, sleeping. Relevant past medical history and comorbidities include recent R unilateral knee arthroplasty completed 02/07/2019, skin cancer 2016, fibromyalgia since 2005, and pre-diabetes etc.; surgeries include hernia repair See more details above).    Limitations  Walking;Lifting;Standing;House hold activities;Sitting    How long can you stand comfortably?  10 mins    How long can you walk comfortably?  10 mins    Diagnostic tests  x-ray: Hemiarthroplasty medial aspect right knee.  Anatomic alignment.  L xray report post op: Satisfactory immediate postoperative appearance status post medialcompartment left knee hemiarthroplasty. Reports her HEP is going okay - she hasn't done as much of them, but has been more active at  home in general. Still using ice, especially between 2-3 am.    Patient Stated Goals  return to work by May 02, 2019.    Currently in Pain?  Yes    Pain Score  3     Pain Location  Knee    Pain Orientation  Left    Pain Onset  In the past 7 days       OBJECTIVE:  -The incision site appearsto be healing well with no excessive redness, warmth, drainage or signs of infection present.  - L knee AAROM: flexion =120degrees ERP; extension = - 5, ERP  TREATMENT: Denies sensitivity to latex  Therapeutic exercise:to centralize  symptoms and improve ROM, strength, muscular endurance, and activity tolerance required for successful completion of functional activities. -Recumbent Bikeno resistance. For improved lower extremity ROM, muscular endurance, and activity tolerance; and to induce the analgesic effect of aerobic exercise, stimulate joint nutrition, and prepare body structures and systems for following interventions. x5Minutesfreely moving at seat setting 7, then moving seat closer every 2 minutes from level 5 to level3 (1.5  minutes) as tolerated.Measured 126 degrees flexion following.  - total gym squats 3x10 at level 11 (top of bar), with full range as tolerated.  - L quad set with back of knee unsupported, 2x10 (5 second holds).  - LAQ with15#ankle weights (except first set was 10# on L). L tibia neutral to tolerate better. 4x20  each side.   - attempted mini squat with BUE support x 10 (painful at end).  - Attempted sit <> stand from elevated surface, too painful.   Pt required multimodal cuing for proper technique and to facilitate improved neuromuscular control, strength, range of motion, and functional ability resulting in improved performance and form.   HOME EXERCISE PROGRAM Access Code: [email or web address] URL: [email or web address] Date: 03/28/2019  Prepared by: Rosita Kea   Exercises Supine Quad Set - 20 reps - 5 seconds hold - 2x daily Active Straight Leg Raise with Quad Set - 3 sets - 10 reps - 1x daily Supine Heel Slide with Strap - 20 reps - 5 seconds hold - 2x daily Hamstring stretch (with strap) - 3 reps - 30 seconds hold - 2x daily   PT Education - 04/17/19 1654    Education Details  exercise purpose/form. Self management techniques    Person(s) Educated  Patient    Methods  Explanation;Demonstration;Tactile cues;Verbal cues;Handout    Comprehension  Verbalized understanding;Verbal cues required;Tactile cues required;Need further instruction;Returned  demonstration       PT Short Term Goals - 04/10/19 1809      PT SHORT TERM GOAL #1   Title  Be independent with initial home exercise program for self-management of symptoms.    Baseline  initial HEP provided at IE (03/27/2019);    Time  2    Period  Weeks    Status  Achieved    Target Date  04/11/19        PT Long Term Goals - 03/30/19 1736      PT LONG TERM GOAL #1   Title  Be independent with a long-term home exercise program for self-management of symptoms.    Baseline  initial HEP provided at IE (03/27/2019)    Time  8    Period  Weeks    Status  Partially Met    Target Date  05/23/19      PT LONG TERM GOAL #2   Title  Demonstrate improved FOTO score by 10 units to demonstrate improvement  in overall condition and self-reported functional ability.    Baseline  FOTO = 30 (03/27/2019)    Time  8    Period  Weeks    Status  On-going    Target Date  05/23/19      PT LONG TERM GOAL #3   Title  Reduce pain with functional activities to equal or less than 1/10 to allow patient to complete usual activities including ADLs, IADLs, and social engagement with less difficulty.    Baseline  5/10 min; 12/10 max (03/27/2019)    Time  8    Period  Weeks    Status  Partially Met    Target Date  05/23/19      PT LONG TERM GOAL #4   Title  Complete community, work and/or recreational activities without limitation due to current condition.    Baseline  Functional limitations: walking, bed mobility, transfers, basic ADLs, IADLs, driving, lifting, walking dog, housework, cooking, standing, weight bearing, stairs, sewing/craftes, social activities, stepping, sleeping (03/27/2019);    Time  8    Period  Weeks    Status  Partially Met    Target Date  05/23/19      PT LONG TERM GOAL #5   Title  Demonstrated R knee extension PROM to neutral and flexion PROM to 130 degrees with less or equal to 1/10 pain at end range to increase functional mobility and reduce fall risk.    Baseline   flexion: 95, extension: -15 and limited by pian (03/27/2019); flexion = 100 (03/30/2019);    Time  8    Period  Weeks    Status  Partially Met    Target Date  05/23/19      PT LONG TERM GOAL #6   Title  Patient will demonstrate timed get up and go (TUG) test equal or less than 11 seconds without use of BUE or AD to demonstrate improved fall risk and functional mobility.    Baseline  deferred test due to pain (03/27/2019); 24.5s with BUE support on chair and SPC with increased pain (03/30/2019);    Time  8    Period  Weeks    Status  On-going    Target Date  05/23/19            Plan - 04/17/19 2028    Clinical Impression Statement  Patient tolerated treatment well and continues to make progress towards goals. Focus was on quad strengthening today and patient reported feeling much better following LAQ and first set of total gym squats. Continues to be limited in closed chain quad strengthening by pain in the knee, left knee especially. Was able to progress to neutral tibia during LAQ as well as greater weight and achieved further flexion of the left knee this session. Patient would benefit from continued management of limiting condition by skilled physical therapist to address remaining impairments and functional limitations to work towards stated goals and return to PLOF or maximal functional independence.    Personal Factors and Comorbidities  Comorbidity 3+;Age;Past/Current Experience;Fitness;Time since onset of injury/illness/exacerbation    Comorbidities  Relevant past medical history and comorbidities include recent R unilateral knee arthroplasty completed 02/07/2019, skin cancer 2016, fibromyalgia since 2005, and pre-diabetes    Examination-Activity Limitations  Bed Mobility;Bathing;Stairs;Squat;Toileting;Sit;Bend;Caring for Others;Lift;Transfers;Dressing;Hygiene/Grooming;Sleep;Locomotion Level;Carry;Stand    Examination-Participation Restrictions  Cleaning;Laundry;Yard Work;Community  Activity;Interpersonal Relationship;Driving;Meal Prep;Shop   work   Stability/Clinical Decision Making  Evolving/Moderate complexity    Rehab Potential  Good    PT Frequency  2x /  week    PT Duration  8 weeks    PT Treatment/Interventions  ADLs/Self Care Home Management;Cryotherapy;Moist Heat;Stair training;Functional mobility training;Therapeutic activities;Therapeutic exercise;Balance training;Neuromuscular re-education;Manual techniques;Dry needling;Scar mobilization;Passive range of motion;Joint Manipulations;Spinal Manipulations;Electrical Stimulation;Patient/family education;Aquatic Therapy;Gait training;DME Instruction    PT Next Visit Plan  pain control, strengthening and ROM as tolerated    PT Home Exercise Plan  Medbridge:[email or web address]    Consulted and Agree with Plan of Care  Patient       Patient will benefit from skilled therapeutic intervention in order to improve the following deficits and impairments:  Abnormal gait, Decreased balance, Decreased endurance, Decreased mobility, Difficulty walking, Hypomobility, Pain, Impaired flexibility, Decreased strength, Decreased activity tolerance, Decreased range of motion, Impaired perceived functional ability, Decreased skin integrity, Increased muscle spasms, Impaired sensation, Decreased scar mobility, Increased edema, Decreased knowledge of use of DME  Visit Diagnosis: Left knee pain, unspecified chronicity  Muscle weakness (generalized)  Unsteadiness on feet  Difficulty in walking, not elsewhere classified     Problem List Patient Active Problem List   Diagnosis Date Noted  . OSA (obstructive sleep apnea) 04/12/2019  . S/P left unicompartmental knee replacement 03/21/2019  . Osteoarthritis of left knee 03/09/2019  . Primary localized osteoarthritis of right knee 02/07/2019  . Status post right partial knee replacement 02/07/2019  . Weight gain 07/14/2018  . Class 1 obesity due to excess calories without serious comorbidity  with body mass index (BMI) of 33.0 to 33.9 in adult 05/13/2018  . Prediabetes 12/03/2017  . Non-restorative sleep 02/27/2016  . Vitamin D deficiency 10/28/2015  . Generalized anxiety disorder 11/25/2012  . HYPERTRIGLYCERIDEMIA 12/20/2008  . MENOPAUSE, EARLY 10/04/2008  . Fibromyalgia 10/04/2008    Everlean Alstrom. Graylon Good, PT, DPT 04/17/19, 8:29 PM  [facility] [address], [place], [address] Phone: [phone]   Fax:  [phone]  Name: Jillian [name] MRN: [id number] Date of Birth: 12-Dec-1955

## 2019-04-19 ENCOUNTER — Emergency Department
Admission: EM | Admit: 2019-04-19 | Discharge: 2019-04-19 | Disposition: A | Discharge status: Home / Self Care | Payer: PRIVATE HEALTH INSURANCE | Attending: Emergency Medicine | Admitting: Emergency Medicine

## 2019-04-19 ENCOUNTER — Encounter: Payer: Self-pay | Admitting: Medical Oncology

## 2019-04-19 ENCOUNTER — Other Ambulatory Visit: Payer: Self-pay

## 2019-04-19 ENCOUNTER — Ambulatory Visit: Payer: PRIVATE HEALTH INSURANCE | Admitting: Physical Therapy

## 2019-04-19 DIAGNOSIS — Z87891 Personal history of nicotine dependence: Secondary | ICD-10-CM | POA: Insufficient documentation

## 2019-04-19 DIAGNOSIS — Z7982 Long term (current) use of aspirin: Secondary | ICD-10-CM | POA: Diagnosis not present

## 2019-04-19 DIAGNOSIS — Z85828 Personal history of other malignant neoplasm of skin: Secondary | ICD-10-CM | POA: Insufficient documentation

## 2019-04-19 DIAGNOSIS — Z96651 Presence of right artificial knee joint: Secondary | ICD-10-CM | POA: Insufficient documentation

## 2019-04-19 DIAGNOSIS — Z7689 Persons encountering health services in other specified circumstances: Secondary | ICD-10-CM

## 2019-04-19 DIAGNOSIS — L0211 Cutaneous abscess of neck: Secondary | ICD-10-CM | POA: Insufficient documentation

## 2019-04-19 DIAGNOSIS — L0291 Cutaneous abscess, unspecified: Secondary | ICD-10-CM

## 2019-04-19 LAB — CBC WITH DIFFERENTIAL/PLATELET
Abs Immature Granulocytes: 0.11 10*3/uL — ABNORMAL HIGH (ref 0.00–0.07)
Basophils Absolute: 0.1 10*3/uL (ref 0.0–0.1)
Basophils Relative: 0 %
Eosinophils Absolute: 0.1 10*3/uL (ref 0.0–0.5)
Eosinophils Relative: 1 %
HCT: 36.3 % (ref 36.0–46.0)
Hemoglobin: 11.8 g/dL — ABNORMAL LOW (ref 12.0–15.0)
Immature Granulocytes: 1 %
Lymphocytes Relative: 9 %
Lymphs Abs: 1.3 10*3/uL (ref 0.7–4.0)
MCH: 30.2 pg (ref 26.0–34.0)
MCHC: 32.5 g/dL (ref 30.0–36.0)
MCV: 92.8 fL (ref 80.0–100.0)
Monocytes Absolute: 1.1 10*3/uL — ABNORMAL HIGH (ref 0.1–1.0)
Monocytes Relative: 8 %
Neutro Abs: 11.3 10*3/uL — ABNORMAL HIGH (ref 1.7–7.7)
Neutrophils Relative %: 81 %
Platelets: 219 10*3/uL (ref 150–400)
RBC: 3.91 MIL/uL (ref 3.87–5.11)
RDW: 12.4 % (ref 11.5–15.5)
WBC: 14 10*3/uL — ABNORMAL HIGH (ref 4.0–10.5)
nRBC: 0 % (ref 0.0–0.2)

## 2019-04-19 LAB — LACTIC ACID, PLASMA: Lactic Acid, Venous: 0.9 mmol/L (ref 0.5–1.9)

## 2019-04-19 MED ORDER — CLINDAMYCIN HCL 300 MG PO CAPS: 300.0000 mg | ORAL_CAPSULE | Freq: Three times a day (TID) | ORAL | 0 refills | Status: AC

## 2019-04-19 MED ORDER — BUPIVACAINE HCL (PF) 0.5 % IJ SOLN
10.0000 mL | Freq: Once | INTRAMUSCULAR | Status: DC
  Filled 2019-04-19: qty 10

## 2019-04-19 MED ORDER — HYDROCODONE-ACETAMINOPHEN 5-325 MG PO TABS: 1.0000 | ORAL_TABLET | Freq: Three times a day (TID) | ORAL | 0 refills | Status: AC | PRN

## 2019-04-19 MED ORDER — LIDOCAINE HCL (PF) 1 % IJ SOLN
5.0000 mL | Freq: Once | INTRAMUSCULAR | Status: AC
  Administered 2019-04-19: 12:00:00 5 mL
  Filled 2019-04-19: qty 5

## 2019-04-19 MED ORDER — BUPIVACAINE HCL (PF) 0.5 % IJ SOLN
10.0000 mL | Freq: Once | INTRAMUSCULAR | Status: AC
  Administered 2019-04-19: 10 mL
  Filled 2019-04-19: qty 10

## 2019-04-19 MED ORDER — OXYCODONE-ACETAMINOPHEN 5-325 MG PO TABS
1.0000 | ORAL_TABLET | Freq: Once | ORAL | Status: AC
  Administered 2019-04-19: 1 via ORAL
  Filled 2019-04-19: qty 1

## 2019-04-19 MED ORDER — CLINDAMYCIN PHOSPHATE 600 MG/50ML IV SOLN
600.0000 mg | Freq: Once | INTRAVENOUS | Status: AC
  Administered 2019-04-19: 12:00:00 600 mg via INTRAVENOUS
  Filled 2019-04-19: qty 50

## 2019-04-19 NOTE — ED Provider Notes (Signed)
[facility] Emergency Department Provider Note ____________________________________________  Time seen: 1022  I have reviewed the triage vital signs and the nursing notes.  HISTORY  Chief Complaint  Abscess  HPI Jillian [name] is a 64 y.o. female presents herself to the ED from [facility].   Patient had a telemetry visit with her provider on Monday, and was placed on Bactrim for an abscess to the back of the neck.  She presented to [facility] today because of no improvement.  Labs were drawn at the urgent care, the patient was sent here due to an elevated white blood cell count of 14.  Patient reports pain to the area as well as ongoing swelling patient denies any fevers, chills, sweats.  She reports a previous history of a facial abscess that required I&D management.  Past Medical History:  Diagnosis Date  . Cancer ([place])    basal cell 2016  . Fibromyalgia   . Pre-diabetes   . Primary localized osteoarthritis of right knee 02/07/2019  . Sleep apnea    uses CPAP    Patient Active Problem List   Diagnosis Date Noted  . OSA (obstructive sleep apnea) 04/12/2019  . S/P left unicompartmental knee replacement 03/21/2019  . Osteoarthritis of left knee 03/09/2019  . Primary localized osteoarthritis of right knee 02/07/2019  . Status post right partial knee replacement 02/07/2019  . Weight gain 07/14/2018  . Class 1 obesity due to excess calories without serious comorbidity with body mass index (BMI) of 33.0 to 33.9 in adult 05/13/2018  . Prediabetes 12/03/2017  . Non-restorative sleep 02/27/2016  . Vitamin D deficiency 10/28/2015  . Generalized anxiety disorder 11/25/2012  . HYPERTRIGLYCERIDEMIA 12/20/2008  . MENOPAUSE, EARLY 10/04/2008  . Fibromyalgia 10/04/2008    Past Surgical History:  Procedure Laterality Date  . CESAREAN SECTION     times 2  . HERNIA REPAIR    . PARTIAL KNEE ARTHROPLASTY Right 02/07/2019   Procedure: UNICOMPARTMENTAL KNEE;  Surgeon: Marchia Bond, MD;  Location: [facility];  Service: Orthopedics;  Laterality: Right;  . PARTIAL KNEE ARTHROPLASTY Left 03/21/2019   Procedure: UNICOMPARTMENTAL KNEE;  Surgeon: Marchia Bond, MD;  Location: [facility];  Service: Orthopedics;  Laterality: Left;    Prior to Admission medications   Medication Sig Start Date End Date Taking? Authorizing Provider  aspirin EC 325 MG tablet Take 1 tablet (325 mg total) by mouth 2 (two) times daily. 03/21/19   Merlene Pulling K, PA-C  buPROPion (WELLBUTRIN XL) 150 MG 24 hr tablet TAKE 1 TABLET(150 MG) BY MOUTH DAILY 01/11/19   Bedsole, Amy E, MD  cholecalciferol (VITAMIN D) 400 units TABS tablet Take 400 Units by mouth daily.     [provider]  clindamycin (CLEOCIN) 300 MG capsule Take 1 capsule (300 mg total) by mouth 3 (three) times daily for 10 days. 04/19/19 04/29/19  Susie Pousson, Dannielle Karvonen, PA-C  Coenzyme Q10 (CO Q 10) 100 MG CAPS Take 100 mg by mouth daily.     [provider]  DULoxetine (CYMBALTA) 30 MG capsule Take 3 capsules (90 mg total) by mouth daily. 01/19/19   Bedsole, Amy E, MD  fish oil-omega-3 fatty acids 1000 MG capsule Take 4,000 mg by mouth daily.     [provider]  HYDROcodone-acetaminophen (NORCO) 5-325 MG tablet Take 1 tablet by mouth 3 (three) times daily as needed for up to 3 days. 04/19/19 04/22/19  Deanta Mincey, Dannielle Karvonen, PA-C  Magnesium Malate 1250 (141.7 Mg) MG TABS Take 141.7-283.4  mg by mouth See admin instructions. Take 141.7mg  by mouth every morning and take 283.4mg  every evening    [provider]  methocarbamol (ROBAXIN) 500 MG tablet Take 1 tablet (500 mg total) by mouth 3 (three) times daily as needed for muscle spasms. 12/16/18   Bedsole, Amy E, MD  oxyCODONE (ROXICODONE) 5 MG immediate release tablet Take 1 tablet (5 mg total) by mouth every 4 (four) hours as needed for severe pain. 03/21/19   Merlene Pulling K, PA-C  pregabalin (LYRICA) 150 MG capsule TAKE 1 CAPSULE(150 MG) BY MOUTH THREE TIMES  DAILY 04/12/19   Bedsole, Amy E, MD  traMADol (ULTRAM) 50 MG tablet TAKE 2 TABLETS BY MOUTH THREE TIMES DAILY 04/05/19   Jinny Sanders, MD    Allergies Patient has no known allergies.  Family History  Problem Relation Age of Onset  . Cancer Father        colon  . Hyperlipidemia Father   . Hypertension Father   . Diabetes Sister   . Hypertension Sister   . Cancer Paternal Aunt        colon  . Cancer Maternal Grandmother        colon  . Hypertension Maternal Grandmother   . Anuerysm Paternal Grandfather     Social History Social History   Tobacco Use  . Smoking status: Former Smoker    Packs/day: 1.00    Years: 15.00    Pack years: 15.00    Types: Cigarettes    Quit date: 03/30/1992    Years since quitting: 27.0  . Smokeless tobacco: Never Used  Substance Use Topics  . Alcohol use: No  . Drug use: No    Review of Systems  Constitutional: Negative for fever. Eyes: Negative for visual changes. ENT: Negative for sore throat. Cardiovascular: Negative for chest pain. Respiratory: Negative for shortness of breath. Musculoskeletal: Negative for back pain. Skin: Negative for rash. Abscess/cellulitis to the neck as above.  Neurological: Negative for headaches, focal weakness or numbness. ____________________________________________  PHYSICAL EXAM:  VITAL SIGNS: ED Triage Vitals  Enc Vitals Group     BP 04/19/19 0919 (!) 150/70     Pulse Rate 04/19/19 0919 (!) 105     Resp 04/19/19 0919 20     Temp 04/19/19 0919 98.2 F (36.8 C)     Temp Source 04/19/19 0919 Oral     SpO2 04/19/19 0919 96 %     Weight 04/19/19 0920 238 lb (108 kg)     Height 04/19/19 0920 5\' 8"  (1.727 m)     Head Circumference --      Peak Flow --      Pain Score 04/19/19 0933 7     Pain Loc --      Pain Edu? --      Excl. in [place]? --     Constitutional: Alert and oriented. Well appearing and in no distress. Head: Normocephalic and atraumatic. Eyes: Conjunctivae are normal. Normal extraocular  movements Neck: Supple. No thyromegaly. Posterior right neck with a single pustule located just below the nape. No erythema, but moderate subcutaneous induration consistent with cellulitis.  Hematological/Lymphatic/Immunological: No cervical lymphadenopathy. Cardiovascular: Normal rate, regular rhythm. Normal distal pulses. Respiratory: Normal respiratory effort.  Musculoskeletal: Nontender with normal range of motion in all extremities.  Neurologic:  Normal gait without ataxia. Normal speech and language. No gross focal neurologic deficits are appreciated. Skin:  Skin is warm, dry and intact. No rash noted. Psychiatric: Mood is anxious and affect is  normal. Patient exhibits appropriate insight and judgment. ____________________________________________   LABS (pertinent positives/negatives)  Labs Reviewed  CBC WITH DIFFERENTIAL/PLATELET - Abnormal; Notable for the following components:      Result Value   WBC 14.0 (*)    Hemoglobin 11.8 (*)    Neutro Abs 11.3 (*)    Monocytes Absolute 1.1 (*)    Abs Immature Granulocytes 0.11 (*)    All other components within normal limits  LACTIC ACID, PLASMA  ____________________________________________  PROCEDURES  Clindamycin 600 mg IVP Percocet 5-325 mg PO  .Marland KitchenIncision and Drainage  Date/Time: 04/19/2019 11:35 AM Performed by: Melvenia Needles, PA-C Authorized by: Melvenia Needles, PA-C   Consent:    Consent obtained:  Verbal   Consent given by:  Patient   Risks discussed:  Pain and bleeding   Alternatives discussed:  Alternative treatment Location:    Type:  Abscess   Location:  Neck   Neck location:  R posterior Pre-procedure details:    Skin preparation:  Betadine Anesthesia (see MAR for exact dosages):    Anesthesia method:  Local infiltration   Local anesthetic:  Lidocaine 1% w/o epi and bupivacaine 0.5% w/o epi Procedure type:    Complexity:  Simple Procedure details:    Needle aspiration: no     Incision  types:  Single straight   Incision depth:  Subcutaneous   Scalpel blade:  11   Wound management:  Probed and deloculated   Drainage:  Bloody   Drainage amount:  Moderate   Packing materials:  1/4 in iodoform gauze   Amount 1/4" iodoform:  3 Post-procedure details:    Patient tolerance of procedure:  Tolerated well, no immediate complications  ____________________________________________  INITIAL IMPRESSION / ASSESSMENT AND PLAN / ED COURSE  Patient with ED evaluation of a focal skin cellulitis to the posterior neck.  Patient was treated empirically with IV antibiotics after an attempted I&D revealed no focal abscess pocket.  The wound was packed and dressed and patient is discharged at this time with a prescription for clindamycin.  She will take the medication as prescribed, follow with primary provider in 3 days for wound check.  A small prescription (#6) for hydrocodone provided for pain relief.  Patient is discharged at this time to follow-up as discussed.  Jillian [name] was evaluated in Emergency Department on 04/19/2019 for the symptoms described in the history of present illness. She was evaluated in the context of the global COVID-19 pandemic, which necessitated consideration that the patient might be at risk for infection with the SARS-CoV-2 virus that causes COVID-19. Institutional protocols and algorithms that pertain to the evaluation of patients at risk for COVID-19 are in a state of rapid change based on information released by regulatory bodies including the CDC and federal and state organizations. These policies and algorithms were followed during the patient's care in the ED.  I reviewed the patient's prescription history over the last 12 months in the multi-state controlled substances database(s) that includes [place], [place], [place], [place], [place], [place], [place], [place], [place], [place], [place], [place], [place], and [place].   Results were notable for multiple RXs.  ____________________________________________  FINAL CLINICAL IMPRESSION(S) / ED DIAGNOSES  Final diagnoses:  Abscess  Encounter for incision and drainage procedure      Melvenia Needles, PA-C 04/19/19 1853    Vanessa Cairo, MD 04/22/19 [phone]

## 2019-04-19 NOTE — ED Triage Notes (Signed)
Pt from Urgent care- was placed on bactrim Monday for abscess to back of neck, went back today bc of no improvement, labs were drawn, pt sent here d/t WBC of 14.

## 2019-04-19 NOTE — Discharge Instructions (Addendum)
Your skin abscess has been drained in the ED. A small amount of packing has been placed in the abscess to promote healing. Keep the area clean, dry, and covered. Continue to take the antibiotics as directed. Follow=up with your provider for wound check and packing removal in 3 days.

## 2019-04-19 NOTE — ED Notes (Signed)
See triage note   Presents with abscess area to back of neck  States she noticed area couple of days ago  Was placed on Bactrim DS on Monday  States area is getting larger  And more tender  To touch

## 2019-04-20 ENCOUNTER — Encounter: Payer: PRIVATE HEALTH INSURANCE | Admitting: Physical Therapy

## 2019-04-24 ENCOUNTER — Encounter: Payer: PRIVATE HEALTH INSURANCE | Admitting: Physical Therapy

## 2019-04-25 ENCOUNTER — Ambulatory Visit: Payer: PRIVATE HEALTH INSURANCE | Admitting: Physical Therapy

## 2019-04-25 ENCOUNTER — Encounter: Payer: Self-pay | Admitting: Physical Therapy

## 2019-04-25 ENCOUNTER — Encounter: Payer: Self-pay | Admitting: Family Medicine

## 2019-04-25 ENCOUNTER — Ambulatory Visit: Payer: PRIVATE HEALTH INSURANCE | Admitting: Family Medicine

## 2019-04-25 ENCOUNTER — Other Ambulatory Visit: Payer: Self-pay

## 2019-04-25 VITALS — BP 118/70 | HR 86 | Temp 97.2°F | Ht 68.0 in | Wt 235.0 lb

## 2019-04-25 DIAGNOSIS — L03221 Cellulitis of neck: Secondary | ICD-10-CM

## 2019-04-25 DIAGNOSIS — L0211 Cutaneous abscess of neck: Secondary | ICD-10-CM

## 2019-04-25 DIAGNOSIS — M25562 Pain in left knee: Secondary | ICD-10-CM

## 2019-04-25 DIAGNOSIS — R262 Difficulty in walking, not elsewhere classified: Secondary | ICD-10-CM

## 2019-04-25 DIAGNOSIS — M6281 Muscle weakness (generalized): Secondary | ICD-10-CM

## 2019-04-25 DIAGNOSIS — R2681 Unsteadiness on feet: Secondary | ICD-10-CM

## 2019-04-25 NOTE — Assessment & Plan Note (Addendum)
No culture sent but concerning for MRSA.  Resolving infection.  Continue clindamycin, restart warm compresses... if not significantly improved at end of 10 days, will have low threshold for continuing clind another 10 days.   Reviewed methods to decrease colonization of staph...  Already using dial and cleaning with heat/bleach,etc

## 2019-04-25 NOTE — Progress Notes (Signed)
Chief Complaint  Patient presents with  . Follow-up    abscess I & D, pt has one bactrim left and is also taking clidamycin    History of Present Illness: HPI   64 year old female presents for follow up ER vist for neck abcess, S/P I and D No improvement on bactrim. Seen on 04/19/19 in ER.. started on clindamycin  wbc 14, no  Wound culture  NOT sent.. not seeen in chart.  Hydrocodone given for pain relief.   Today she reports she has had decrease in swelling, pain, no further draining.  no fever. No jaw pain, no throat pain.  She is feeling  Well... no flu like symptoms.   HAs 4 more days of antibiotics.  This visit occurred during the SARS-CoV-2 public health emergency.  Safety protocols were in place, including screening questions prior to the visit, additional usage of staff PPE, and extensive cleaning of exam room while observing appropriate contact time as indicated for disinfecting solutions.   COVID 19 screen:  No recent travel or known exposure to COVID19 The patient denies respiratory symptoms of COVID 19 at this time. The importance of social distancing was discussed today.     ROS    Past Medical History:  Diagnosis Date  . Cancer ([place])    basal cell 2016  . Fibromyalgia   . Pre-diabetes   . Primary localized osteoarthritis of right knee 02/07/2019  . Sleep apnea    uses CPAP    reports that she quit smoking about 27 years ago. Her smoking use included cigarettes. She has a 15.00 pack-year smoking history. She has never used smokeless tobacco. She reports that she does not drink alcohol or use drugs.   Current Outpatient Medications:  .  buPROPion (WELLBUTRIN XL) 150 MG 24 hr tablet, TAKE 1 TABLET(150 MG) BY MOUTH DAILY, Disp: 90 tablet, Rfl: 1 .  cholecalciferol (VITAMIN D) 400 units TABS tablet, Take 400 Units by mouth daily. , Disp: , Rfl:  .  clindamycin (CLEOCIN) 300 MG capsule, Take 1 capsule (300 mg total) by mouth 3 (three) times daily for 10 days.,  Disp: 30 capsule, Rfl: 0 .  Coenzyme Q10 (CO Q 10) 100 MG CAPS, Take 100 mg by mouth daily. , Disp: , Rfl:  .  DULoxetine (CYMBALTA) 30 MG capsule, Take 3 capsules (90 mg total) by mouth daily., Disp: 270 capsule, Rfl: 1 .  fish oil-omega-3 fatty acids 1000 MG capsule, Take 4,000 mg by mouth daily. , Disp: , Rfl:  .  Magnesium Malate 1250 (141.7 Mg) MG TABS, Take 141.7-283.4 mg by mouth See admin instructions. Take 141.7mg  by mouth every morning and take 283.4mg  every evening, Disp: , Rfl:  .  methocarbamol (ROBAXIN) 500 MG tablet, Take 1 tablet (500 mg total) by mouth 3 (three) times daily as needed for muscle spasms., Disp: 90 tablet, Rfl: 0 .  pregabalin (LYRICA) 150 MG capsule, TAKE 1 CAPSULE(150 MG) BY MOUTH THREE TIMES DAILY, Disp: 90 capsule, Rfl: 0 .  traMADol (ULTRAM) 50 MG tablet, TAKE 2 TABLETS BY MOUTH THREE TIMES DAILY, Disp: 180 tablet, Rfl: 0 .  aspirin EC 325 MG tablet, Take 1 tablet (325 mg total) by mouth 2 (two) times daily. (Patient not taking: Reported on 04/25/2019), Disp: 60 tablet, Rfl: 0 .  oxyCODONE (ROXICODONE) 5 MG immediate release tablet, Take 1 tablet (5 mg total) by mouth every 4 (four) hours as needed for severe pain. (Patient not taking: Reported on 04/25/2019), Disp: 30 tablet,  Rfl: 0 .  sulfamethoxazole-trimethoprim (BACTRIM DS) 800-160 MG tablet, Take 1 tablet by mouth 2 (two) times daily., Disp: , Rfl:    Observations/Objective: Blood pressure 118/70, pulse 86, temperature (!) 97.2 F (36.2 C), temperature source Oral, height 5\' 8"  (1.727 m), weight 235 lb (106.6 kg), SpO2 98 %.  Physical Exam Constitutional:      General: She is not in acute distress.    Appearance: Normal appearance. She is well-developed. She is not ill-appearing or toxic-appearing.  HENT:     Head: Normocephalic.     Right Ear: Hearing, tympanic membrane, ear canal and external ear normal. Tympanic membrane is not erythematous, retracted or bulging.     Left Ear: Hearing, tympanic  membrane, ear canal and external ear normal. Tympanic membrane is not erythematous, retracted or bulging.     Nose: No mucosal edema or rhinorrhea.     Right Sinus: No maxillary sinus tenderness or frontal sinus tenderness.     Left Sinus: No maxillary sinus tenderness or frontal sinus tenderness.     Mouth/Throat:     Pharynx: Uvula midline.  Eyes:     General: Lids are normal. Lids are everted, no foreign bodies appreciated.     Conjunctiva/sclera: Conjunctivae normal.     Pupils: Pupils are equal, round, and reactive to light.  Neck:     Thyroid: No thyroid mass or thyromegaly.     Vascular: No carotid bruit.     Trachea: Trachea normal.  Cardiovascular:     Rate and Rhythm: Normal rate and regular rhythm.     Pulses: Normal pulses.     Heart sounds: Normal heart sounds, S1 normal and S2 normal. No murmur. No friction rub. No gallop.   Pulmonary:     Effort: Pulmonary effort is normal. No tachypnea or respiratory distress.     Breath sounds: Normal breath sounds. No decreased breath sounds, wheezing, rhonchi or rales.  Abdominal:     General: Bowel sounds are normal.     Palpations: Abdomen is soft.     Tenderness: There is no abdominal tenderness.  Musculoskeletal:     Cervical back: Normal range of motion and neck supple.  Skin:    General: Skin is warm and dry.     Findings: No rash.          Comments: Right posterior neck with erythema and indurated tissue, 7 cm x 2-4 cm... no fluctuance, mild ttp Closed inscision no longer draining.  Neurological:     Mental Status: She is alert.  Psychiatric:        Mood and Affect: Mood is not anxious or depressed.        Speech: Speech normal.        Behavior: Behavior normal. Behavior is cooperative.        Thought Content: Thought content normal.        Judgment: Judgment normal.      Assessment and Plan   Cellulitis and abscess of neck No culture sent but concerning for MRSA.  Resolving infection.  Continue clindamycin,  restart warm compresses... if not significantly improved at end of 10 days, will have low threshold for continuing clind another 10 days.   Reviewed methods to decrease colonization of staph...  Already using dial and cleaning with heat/bleach,etc     Eliezer Lofts, MD

## 2019-04-25 NOTE — Therapy (Signed)
[facility] [address], [place], [address] Phone: [phone]   Fax:  [phone]  Physical Therapy Treatment / Discharge Summary Reporting period: 03/27/2019 - 04/25/2019  Patient Details  Name: Jillian [name] MRN: [id number] Date of Birth: 11-22-1955 Referring Provider (PT): Johnny Bridge, MD   Encounter Date: 04/25/2019  PT End of Session - 04/25/19 1717    Visit Number  8    Number of Visits  16    Date for PT Re-Evaluation  05/23/19    Authorization Type  [employer] reporting from 03/27/2019    Authorization - Visit Number  8    Authorization - Number of Visits  10    PT Start Time  6433    PT Stop Time  2951    PT Time Calculation (min)  43 min    Activity Tolerance  Patient tolerated treatment well    Behavior During Therapy  [facility] for tasks assessed/performed       Past Medical History:  Diagnosis Date  . Cancer ([place])    basal cell 2016  . Fibromyalgia   . Pre-diabetes   . Primary localized osteoarthritis of right knee 02/07/2019  . Sleep apnea    uses CPAP    Past Surgical History:  Procedure Laterality Date  . CESAREAN SECTION     times 2  . HERNIA REPAIR    . PARTIAL KNEE ARTHROPLASTY Right 02/07/2019   Procedure: UNICOMPARTMENTAL KNEE;  Surgeon: Marchia Bond, MD;  Location: [facility];  Service: Orthopedics;  Laterality: Right;  . PARTIAL KNEE ARTHROPLASTY Left 03/21/2019   Procedure: UNICOMPARTMENTAL KNEE;  Surgeon: Marchia Bond, MD;  Location: [facility];  Service: Orthopedics;  Laterality: Left;    There were no vitals filed for this visit.  Subjective Assessment - 04/25/19 1711    Subjective  Patient reports she has 4/10 pain that she attributes to her fibromyalgia. Is not bothered by knee pain which she rates as 2/10. Wonders if she can be ready to discharge today. States she missed her last treatment session because of an infection on her neck. It is getting better now. She is  not having trouble with anything except standing up from the chair; however, she was able to get up from a chair without holding on to anything, so this is improving too. She feels like her balance has been improving as well. she walked about 3.5 miles this weekend with her mom. She has not yet returned to work but is looking forward to it.    Pertinent History  Patient is a 64 y.o. female who presents to outpatient physical therapy with a referral for medical diagnosis s/p left uni knee arthroplasty. This patient's chief complaints consist of L knee pain, stiffness, and weakness leading to the following functional deficits: difficulty with walking, bed mobility, transfers, basic ADLs, IADLs, driving, lifting, walking dog, housework, cooking, standing, weight bearing, stairs, sewing/craftes, social activities, stepping, sleeping. Relevant past medical history and comorbidities include recent R unilateral knee arthroplasty completed 02/07/2019, skin cancer 2016, fibromyalgia since 2005, and pre-diabetes etc.; surgeries include hernia repair See more details above).    Limitations  Walking;Lifting;Standing;House hold activities;Sitting    How long can you stand comfortably?  10 mins    How long can you walk comfortably?  10 mins    Diagnostic tests  x-ray: Hemiarthroplasty medial aspect right knee.  Anatomic alignment.  L xray report post op: Satisfactory immediate postoperative appearance status post medialcompartment  left knee hemiarthroplasty. Reports her HEP is going okay - she hasn't done as much of them, but has been more active at home in general. Still using ice, especially between 2-3 am.    Patient Stated Goals  return to work by May 02, 2019.    Currently in Pain?  Yes    Pain Score  4     Pain Location  --   body wide   Pain Onset  In the past 7 days        OBJECTIVE:   PERIPHERAL JOINT MOTION (in degrees) *Indicates pain, R/L 04/25/19   Joint/Motion AROM PROM  Knee    Flexion  125/125 132/130*  Extension 0/0 0/0  Comments: hip and ankles grossly WFL, not formally tested. L knee very painful to motion.    MUSCLE PERFORMANCE (MMT):  *Indicates pain 03/28/19 04/25/19  Joint/Motion R/L R/L  Hip    Flexion (knee flex) 4+/4* 4+/4+  Abduction (seated) 4+/4+* 4+/4+  Adduction (seated) 4+/3+* 4+/4+  Knee    Flexion 5*/4+ 5/5  Extension 5*/4* 5/5-  Comments: B ankle WFL  FUNCTIONAL MOBILITY:  Bed mobility: sit <> supine I  Transfers: sit <> stand mod I with BUE support  FUNCTIONAL/BALANCE TESTS:  Timed Up and Go (average of three trials): 7 seconds with BUE support on chair arms. Attempted without UE and limited by pain and weakness.   L SLS > 15 seconds, minimal sway  EDUCATION/COGNITION: Patient is alert and oriented X 4.   TREATMENT: Denies sensitivity to latex  Therapeutic exercise:to centralize symptoms and improve ROM, strength, muscular endurance, and activity tolerance required for successful completion of functional activities. -Recumbent Bikeno resistance. For improved lower extremity ROM, muscular endurance, and activity tolerance; and to induce the analgesic effect of aerobic exercise, stimulate joint nutrition, and prepare body structures and systems for following interventions. x5Minutesfreely moving at seat setting5, then moving seat closer every 2 minutes level 4, 3, 2 as tolerated.Measured 125 degrees AAROM flexion following. - 5 min to fill out FOTO questionnaire (unbilled) - objective measurements to assess progress and readiness for discharge, including AROM, PROM, MMT, TUG, SLS balance (see above) - mini squat with BUE support x 10 (painful at end), cuing for proper use of hips and to prevent excessive anterior translation of tibia.   - Education on diagnosis, prognosis, POC, anatomy and physiology of current condition.  - reviewed HEP and chose exercises for long term use with instructions on how to progress.    Pt required multimodal cuing for proper technique and to facilitate improved neuromuscular control, strength, range of motion, and functional ability resulting in improved performance and form.   HOME EXERCISE PROGRAM Access Code: [email or web address]  URL: [email or web address]  Date: 04/25/2019  Prepared by: Rosita Kea   Exercises Single Leg Stance - 1-2 reps - 1 miin hold - 1 every other day        PT Education - 04/25/19 1715    Education Details  exercise purpose/form. Self management techniques. Long term management strategies.    Person(s) Educated  Patient    Methods  Explanation;Demonstration;Tactile cues;Verbal cues;Handout    Comprehension  Verbalized understanding;Returned demonstration;Verbal cues required;Tactile cues required       PT Short Term Goals - 04/25/19 1803      PT SHORT TERM GOAL #1   Title  Be independent with initial home exercise program for self-management of symptoms.    Baseline  initial HEP provided at IE (03/27/2019);    Time  2  Period  Weeks    Status  Achieved    Target Date  04/11/19        PT Long Term Goals - 04/25/19 1803      PT LONG TERM GOAL #1   Title  Be independent with a long-term home exercise program for self-management of symptoms.    Baseline  initial HEP provided at IE (03/27/2019)    Time  8    Period  Weeks    Status  Achieved    Target Date  05/23/19      PT LONG TERM GOAL #2   Title  Demonstrate improved FOTO score by 10 units to demonstrate improvement in overall condition and self-reported functional ability.    Baseline  FOTO = 30 (03/27/2019); FOTO = 65 (04/25/2019);    Time  8    Period  Weeks    Status  Achieved    Target Date  05/23/19      PT LONG TERM GOAL #3   Title  Reduce pain with functional activities to equal or less than 1/10 to allow patient to complete usual activities including ADLs, IADLs, and social engagement with less difficulty.    Baseline  5/10 min; 12/10 max  (03/27/2019); 2-3/10 over last few days (04/25/2019);    Time  8    Period  Weeks    Status  Partially Met    Target Date  05/23/19      PT LONG TERM GOAL #4   Title  Complete community, work and/or recreational activities without limitation due to current condition.    Baseline  Functional limitations: walking, bed mobility, transfers, basic ADLs, IADLs, driving, lifting, walking dog, housework, cooking, standing, weight bearing, stairs, sewing/craftes, social activities, stepping, sleeping (03/27/2019); able to complete all activities without limitation except has some mild disruption in sleep she associates with paresthesia in the skin, cannot get up from chair reliably without using UEs, has not yet returned to work but feels ready to go; pt satisfied with current progres (04/25/2019);    Time  8    Period  Weeks    Status  Partially Met    Target Date  05/23/19      PT LONG TERM GOAL #5   Title  Demonstrated R knee extension PROM to neutral and flexion PROM to 130 degrees with less or equal to 1/10 pain at end range to increase functional mobility and reduce fall risk.    Baseline  flexion: 95, extension: -15 and limited by pian (03/27/2019); flexion = 100 (03/30/2019); extension = 0, flexion = 130 with ERP (04/25/2019);    Time  8    Period  Weeks    Status  Achieved    Target Date  05/23/19      PT LONG TERM GOAL #6   Title  Patient will demonstrate timed get up and go (TUG) test equal or less than 11 seconds without use of BUE or AD to demonstrate improved fall risk and functional mobility.    Baseline  deferred test due to pain (03/27/2019); 24.5s with BUE support on chair and SPC with increased pain (03/30/2019); 7 seconds with BUE support from chair arms (04/25/2019);    Time  8    Period  Weeks    Status  Partially Met    Target Date  05/23/19       Plan - 04/25/19 1917    Clinical Impression Statement  Patient has attended 8 physical therapy sessions this episode of  care and  has made great progress towards goals overall. She has met or nearly met all goals and reports feeling confident in her ability to continue progress independently with long term HEP at home. Objectively, patient has achieved knee ROM of goal, improved in strength, pain, and TUG test. Subjectively, she reports returning to most activities including going up and down stairs regularly and taking a 3 mile walk recently. She has not yet achieved the ability to get up and down from a chair reliably without pushing off with her hands but has show improvements in this over the last few weeks. She also reports a bit of difficulty with sleeping, but is not concerned about this. She has plans to return to work in the next few weeks. Patient appears to be ready for discharge given her personal  goals and demonstration of essential mobility and function. Her FOTO score has improved to 65, demonstrating significant improvement in self-reported function. Patient has been provided a long term HEP and educated in important progressions she can work on independently. Patient is now discharged from physical therapy due to meeting goals sufficiently.    Personal Factors and Comorbidities  Comorbidity 3+;Age;Past/Current Experience;Fitness;Time since onset of injury/illness/exacerbation    Comorbidities  Relevant past medical history and comorbidities include recent R unilateral knee arthroplasty completed 02/07/2019, skin cancer 2016, fibromyalgia since 2005, and pre-diabetes    Examination-Activity Limitations  Bed Mobility;Bathing;Stairs;Squat;Toileting;Sit;Bend;Caring for Others;Lift;Transfers;Dressing;Hygiene/Grooming;Sleep;Locomotion Level;Carry;Stand    Examination-Participation Restrictions  Cleaning;Laundry;Yard Work;Community Activity;Interpersonal Relationship;Driving;Meal Prep;Shop   work   [employer]  Evolving/Moderate complexity    Rehab Potential  Good    PT Frequency  2x / week    PT  Duration  8 weeks    PT Treatment/Interventions  ADLs/Self Care Home Management;Cryotherapy;Moist Heat;Stair training;Functional mobility training;Therapeutic activities;Therapeutic exercise;Balance training;Neuromuscular re-education;Manual techniques;Dry needling;Scar mobilization;Passive range of motion;Joint Manipulations;Spinal Manipulations;Electrical Stimulation;Patient/family education;Aquatic Therapy;Gait training;DME Instruction    PT Next Visit Plan  patient is now discharged from physical therapy due to satisfactory progress towards goals    PT Home Exercise Plan  Medbridge:[email or web address]    Consulted and Agree with Plan of Care  Patient       Patient will benefit from skilled therapeutic intervention in order to improve the following deficits and impairments:  Abnormal gait, Decreased balance, Decreased endurance, Decreased mobility, Difficulty walking, Hypomobility, Pain, Impaired flexibility, Decreased strength, Decreased activity tolerance, Decreased range of motion, Impaired perceived functional ability, Decreased skin integrity, Increased muscle spasms, Impaired sensation, Decreased scar mobility, Increased edema, Decreased knowledge of use of DME  Visit Diagnosis: Left knee pain, unspecified chronicity  Muscle weakness (generalized)  Unsteadiness on feet  Difficulty in walking, not elsewhere classified     Problem List Patient Active Problem List   Diagnosis Date Noted  . OSA (obstructive sleep apnea) 04/12/2019  . S/P left unicompartmental knee replacement 03/21/2019  . Osteoarthritis of left knee 03/09/2019  . Primary localized osteoarthritis of right knee 02/07/2019  . Status post right partial knee replacement 02/07/2019  . Cellulitis and abscess of neck 12/23/2018  . Weight gain 07/14/2018  . Class 1 obesity due to excess calories without serious comorbidity with body mass index (BMI) of 33.0 to 33.9 in adult 05/13/2018  . Prediabetes 12/03/2017  . Non-restorative  sleep 02/27/2016  . Vitamin D deficiency 10/28/2015  . Generalized anxiety disorder 11/25/2012  . HYPERTRIGLYCERIDEMIA 12/20/2008  . MENOPAUSE, EARLY 10/04/2008  . Fibromyalgia 10/04/2008   Everlean Alstrom. Graylon Good, PT, DPT 04/25/19, 7:18 PM  [facility] [address] [place], [address] Phone: [phone]   Fax:  [phone]  Name: Jillian [name] MRN: [id number] Date of Birth: [birth year]

## 2019-04-26 ENCOUNTER — Encounter: Payer: PRIVATE HEALTH INSURANCE | Admitting: Physical Therapy

## 2019-04-27 ENCOUNTER — Ambulatory Visit: Payer: PRIVATE HEALTH INSURANCE | Admitting: Physical Therapy

## 2019-05-02 ENCOUNTER — Other Ambulatory Visit: Payer: Self-pay | Admitting: Family Medicine

## 2019-05-02 MED ORDER — TRAMADOL HCL 50 MG PO TABS: 100.0000 mg | ORAL_TABLET | Freq: Three times a day (TID) | ORAL | 0 refills | Status: DC

## 2019-05-11 ENCOUNTER — Other Ambulatory Visit: Payer: Self-pay | Admitting: *Deleted

## 2019-05-11 NOTE — Telephone Encounter (Signed)
Last office visit 04/25/2019 for cellulitis and abscess of neck.  Last refilled 04/12/2019 for #90 with no refills.  No future appointments.

## 2019-05-12 MED ORDER — PREGABALIN 150 MG PO CAPS: ORAL_CAPSULE | ORAL | 0 refills | Status: DC

## 2019-05-18 ENCOUNTER — Other Ambulatory Visit: Payer: Self-pay | Admitting: Family Medicine

## 2019-05-18 MED ORDER — TRAZODONE HCL 50 MG PO TABS: 25.0000 mg | ORAL_TABLET | Freq: Every evening | ORAL | 3 refills | Status: DC | PRN

## 2019-05-29 ENCOUNTER — Other Ambulatory Visit: Payer: Self-pay | Admitting: *Deleted

## 2019-05-29 MED ORDER — TRAMADOL HCL 50 MG PO TABS: 100.0000 mg | ORAL_TABLET | Freq: Three times a day (TID) | ORAL | 0 refills | Status: DC

## 2019-05-29 NOTE — Telephone Encounter (Signed)
Last office visit 04/25/2019 cellulitis and abscess of neck.  Last refilled 05/02/2019 for #180 with no refills.  No future appointments.

## 2019-06-07 ENCOUNTER — Other Ambulatory Visit: Payer: Self-pay | Admitting: *Deleted

## 2019-06-07 MED ORDER — METHOCARBAMOL 500 MG PO TABS: 500.0000 mg | ORAL_TABLET | Freq: Three times a day (TID) | ORAL | 0 refills | Status: DC | PRN

## 2019-06-07 NOTE — Telephone Encounter (Signed)
Last office visit 04/25/2019 for hospital follow.  Last refilled 12/16/2018 for #90 with no refills.  No future appointments.

## 2019-06-08 ENCOUNTER — Other Ambulatory Visit: Payer: Self-pay | Admitting: Family Medicine

## 2019-06-08 NOTE — Telephone Encounter (Signed)
Pt calling for a refill of her Trazodone 50mg  - pt states that last Rx sent was only for 14 days and she needs 30 day supply so that she does not run out. Pt takes 50mg  every night not 25mg . Please advise, thanks.

## 2019-06-08 NOTE — Telephone Encounter (Signed)
Spoke with Bethena Roys.  She states what [facility] filled was from Dr. Mardelle Matte.  I advised her to contact her pharmacy and have them check her file because she should have refills from Dr. Diona Browner that was sent in on 05/28/2019 for Trazadone 50 mg #30 with 3 refills.  Patient will contact [facility] and call me back if she has any trouble.

## 2019-06-09 ENCOUNTER — Other Ambulatory Visit: Payer: Self-pay

## 2019-06-09 MED ORDER — PREGABALIN 150 MG PO CAPS: ORAL_CAPSULE | ORAL | 0 refills | Status: DC

## 2019-06-09 NOTE — Telephone Encounter (Signed)
Pt said few days ago pt requested refill of Pregabalin 150 mg from [facility] [address]. Do not see refill request.   Name of Medication: Pregabalin 150 mg Name of Pharmacy:[facility] [address]  Last Fill or Written Date and Quantity: # 90 on 05/12/19 Last Office Visit and Type: 01/26/21FU Next Office Visit and Type: none scheduled

## 2019-06-27 ENCOUNTER — Ambulatory Visit (INDEPENDENT_AMBULATORY_CARE_PROVIDER_SITE_OTHER): Payer: PRIVATE HEALTH INSURANCE | Admitting: Pulmonary Disease

## 2019-06-27 ENCOUNTER — Encounter: Payer: Self-pay | Admitting: Pulmonary Disease

## 2019-06-27 ENCOUNTER — Other Ambulatory Visit: Payer: Self-pay | Admitting: *Deleted

## 2019-06-27 DIAGNOSIS — G4733 Obstructive sleep apnea (adult) (pediatric): Secondary | ICD-10-CM

## 2019-06-27 MED ORDER — TRAMADOL HCL 50 MG PO TABS: 100.0000 mg | ORAL_TABLET | Freq: Three times a day (TID) | ORAL | 0 refills | Status: DC

## 2019-06-27 NOTE — Patient Instructions (Addendum)
You were seen today by Lauraine Rinne, NP  for:   Pleasure talking with you today over the phone.  You are doing great with using your CPAP.  Keep up the hard work.  Please do not hesitate to give our office a call if you have additional questions or concerns.  We will see you back in 1 year,  Aaron Edelman  1. OSA (obstructive sleep apnea)  Refill supplies for 1 year  We recommend that you continue using your CPAP daily >>>Keep up the hard work using your device >>> Goal should be wearing this for the entire night that you are sleeping, at least 4 to 6 hours  Remember:  . Do not drive or operate heavy machinery if tired or drowsy.  . Please notify the supply company and office if you are unable to use your device regularly due to missing supplies or machine being broken.  . Work on maintaining a healthy weight and following your recommended nutrition plan  . Maintain proper daily exercise and movement  . Maintaining proper use of your device can also help improve management of other chronic illnesses such as: Blood pressure, blood sugars, and weight management.   BiPAP/ CPAP Cleaning:  >>>Clean weekly, with Dawn soap, and bottle brush.  Set up to air dry. >>> Wipe mask out daily with wet wipe or towelette    Follow Up:    Return in about 1 year (around 06/26/2020), or if symptoms worsen or fail to improve, for [facility], Follow up with Dr. Halford Chessman.   Please do your part to reduce the spread of COVID-19:      Reduce your risk of any infection  and COVID19 by using the similar precautions used for avoiding the common cold or flu:  Marland Kitchen Wash your hands often with soap and warm water for at least 20 seconds.  If soap and water are not readily available, use an alcohol-based hand sanitizer with at least 60% alcohol.  . If coughing or sneezing, cover your mouth and nose by coughing or sneezing into the elbow areas of your shirt or coat, into a tissue or into your sleeve (not  your hands). Langley Gauss A MASK when in public  . Avoid shaking hands with others and consider head nods or verbal greetings only. . Avoid touching your eyes, nose, or mouth with unwashed hands.  . Avoid close contact with people who are sick. . Avoid places or events with large numbers of people in one location, like concerts or sporting events. . If you have some symptoms but not all symptoms, continue to monitor at home and seek medical attention if your symptoms worsen. . If you are having a medical emergency, call 911.   [place] / e-Visit: [email or web address]         [facility]: [facility]: [id number]                   [facility]: [id number]     It is flu season:   >>> Best ways to protect herself from the flu: Receive the yearly flu vaccine, practice good hand hygiene washing with soap and also using hand sanitizer when available, eat a nutritious meals, get adequate rest, hydrate appropriately   Please contact the office if your symptoms worsen or you have concerns that you are not improving.   Thank you for choosing [facility] for your healthcare, and for allowing [place] to partner with you on your healthcare journey. I am thankful to be able to provide care to you today.   Wyn Quaker FNP-C

## 2019-06-27 NOTE — Progress Notes (Signed)
Virtual Visit via Telephone Note  I connected with SARANG HUNSAKER on 06/27/19 at  4:30 PM EDT by telephone and verified that I am speaking with the correct person using two identifiers.  Location: Patient: Home Provider: [facility] - [id number], [place], [place], [place] [address]   I discussed the limitations, risks, security and privacy concerns of performing an evaluation and management service by telephone and the availability of in person appointments. I also discussed with the patient that there may be a patient responsible charge related to this service. The patient expressed understanding and agreed to proceed.  Patient consented to consult via telephone: Yes People present and their role in pt care: Pt   History of Present Illness:  64 year old female former smoker followed in our office for obstructive sleep apnea  Past medical history: Hyperlipidemia, fibromyalgia, vitamin D deficiency, prediabetes, osteoarthritis Smoking history: Former smoker.  Quit 1994.  15-pack-year smoking history Maintenance: None Patient of Dr. Alva Garnet  Chief complaint: 1 year follow-up obstructive sleep apnea  Patient reached by telephone today to complete 1 year follow-up for obstructive sleep apnea.  CPAP compliance report shows excellent compliance.  See compliance report listed below:  05/27/2019-06/25/2018 21-30 had a last 30 days use, 25 of those days greater than 4 hours, average usage 6 hours and 5 minutes, APAP setting 8-15, AHI 1.7  Patient has no acute concerns or complaints regarding her CPAP.  She reports she did have some slight breaks in therapy back in November/December 2020 after her knee surgery.  She cannot sleep without it.     Observations/Objective:  PSG 05/21/16: AHI 22/hr. Recommended Auto-set 8-15 cm H2O  Social History   Tobacco Use  Smoking Status Former Smoker  . Packs/day: 1.00  . Years: 15.00  . Pack years: 15.00  . Types: Cigarettes  .  Quit date: 03/30/1992  . Years since quitting: 27.2  Smokeless Tobacco Never Used   Immunization History  Administered Date(s) Administered  . Td 03/30/2004  . Tdap 10/28/2015  . Zoster 08/30/2014    Assessment and Plan:  OSA (obstructive sleep apnea) 2018 sleep study shows moderate effective sleep apnea CPAP compliance shows excellent compliance today  Plan: Continue CPAP therapy Supplies for 1 year Follow-up with our office sooner if you are having any issues or difficulty using CPAP   Follow Up Instructions:  Return in about 1 year (around 06/26/2020), or if symptoms worsen or fail to improve, for [facility], Follow up with Dr. Halford Chessman.   I discussed the assessment and treatment plan with the patient. The patient was provided an opportunity to ask questions and all were answered. The patient agreed with the plan and demonstrated an understanding of the instructions.   The patient was advised to call back or seek an in-person evaluation if the symptoms worsen or if the condition fails to improve as anticipated.  I provided 14 minutes of non-face-to-face time during this encounter.   Lauraine Rinne, NP

## 2019-06-27 NOTE — Telephone Encounter (Signed)
Last office visit 04/25/2019 for follow up cellulitis/abcess of neck. Last refilled  05/29/2019 for #180 with no refills.  No future appointments with PCP.

## 2019-06-27 NOTE — Assessment & Plan Note (Signed)
2018 sleep study shows moderate effective sleep apnea CPAP compliance shows excellent compliance today  Plan: Continue CPAP therapy Supplies for 1 year Follow-up with our office sooner if you are having any issues or difficulty using CPAP

## 2019-07-03 NOTE — Progress Notes (Signed)
Reviewed and agree with assessment/plan.   Terel Bann, MD [facility] 03/25/2016, 12:24 PM Pager:  [phone]  

## 2019-07-08 ENCOUNTER — Other Ambulatory Visit: Payer: Self-pay | Admitting: Family Medicine

## 2019-07-11 ENCOUNTER — Other Ambulatory Visit: Payer: Self-pay | Admitting: Family Medicine

## 2019-07-11 ENCOUNTER — Other Ambulatory Visit: Payer: Self-pay

## 2019-07-11 NOTE — Telephone Encounter (Signed)
Pt left v/m requesting refill pregabalin 150 mg; [facility] [address] has not gotten response. I do not see refill request for pregabalin 150 mg.  Name of Elysian 150 mg  Name of Pharmacy: [facility] [address] Last Fill or Written Date and Quantity:#90 on 06/09/19  Last Office Visit and Type:04/25/19 acute &  05/10/18 annual  Next Office Visit and Type: none scheduled  per pt v/m pt is out of medication.

## 2019-07-11 NOTE — Telephone Encounter (Signed)
Rx was last refilled on 01/19/19 for #270 with 1 refill. Patient was last seen 04/25/19 for an acute issue, and has no upcoming appts. Is this ok to refill?

## 2019-07-12 MED ORDER — PREGABALIN 150 MG PO CAPS: ORAL_CAPSULE | ORAL | 0 refills | Status: DC

## 2019-07-12 NOTE — Telephone Encounter (Signed)
Pt left v/m requesting cb on status of lyrica refill; pt is out of med. [facility] [address].

## 2019-07-12 NOTE — Telephone Encounter (Signed)
Jillian [name] notified by telephone that Dr. Diona Browner just refilled her Lyrica so she should be able to pick it up today.

## 2019-07-25 ENCOUNTER — Other Ambulatory Visit: Payer: Self-pay | Admitting: Family Medicine

## 2019-07-25 NOTE — Telephone Encounter (Signed)
Last office visit 04/25/2019 for hospital follow up. Last refilled 06/27/2019 for #180 with no refills. No future appointments.

## 2019-08-14 ENCOUNTER — Other Ambulatory Visit: Payer: Self-pay | Admitting: *Deleted

## 2019-08-14 NOTE — Telephone Encounter (Signed)
Last office visit 04/25/2019 for cellulitis and abscess of neck.  Last refilled 07/12/2019 for #90 with no refills.  No future appointments.

## 2019-08-15 MED ORDER — PREGABALIN 150 MG PO CAPS: ORAL_CAPSULE | ORAL | 0 refills | Status: DC

## 2019-08-22 ENCOUNTER — Other Ambulatory Visit: Payer: Self-pay | Admitting: Family Medicine

## 2019-08-22 NOTE — Telephone Encounter (Signed)
Last office visit 04/25/2019 for follow up abscess.  Last refilled 07/25/2019 for #180 with no refills.  No future appointments.

## 2019-09-16 ENCOUNTER — Other Ambulatory Visit: Payer: Self-pay | Admitting: Family Medicine

## 2019-09-16 NOTE — Telephone Encounter (Signed)
Last office visit 04/25/2019 for cellulitis and abscess of neck.  Last refilled 08/15/2019 for #90 with no refills.  No future appointments.

## 2019-09-18 ENCOUNTER — Other Ambulatory Visit: Payer: Self-pay | Admitting: Family Medicine

## 2019-09-18 NOTE — Telephone Encounter (Signed)
Patient last seen 04/25/19 for an ER follow up. Med last filled 08/22/19 #180 with 0 refills. No future appts scheduled.

## 2019-10-16 ENCOUNTER — Other Ambulatory Visit: Payer: Self-pay | Admitting: Family Medicine

## 2019-10-16 NOTE — Telephone Encounter (Signed)
Last office visit 04/25/2019 for follow up Abscess I&D.  Last refilled 09/18/2019 for #180 with no refills.  No future appointments.

## 2019-11-13 ENCOUNTER — Other Ambulatory Visit: Payer: Self-pay | Admitting: Family Medicine

## 2019-11-13 NOTE — Telephone Encounter (Signed)
Last office visit 04/25/2019 for follow up cellulitis and abscess of neck.  Last refilled 10/16/2019 for #180 with no refills.  No future appointments.

## 2019-11-19 IMAGING — DX DG KNEE 1-2V PORT*R*
2 series · 2 of 2 positions shown · non-contrast
Comparison: MRI 11/28/2018.

CLINICAL DATA: Partial knee replacement.

EXAM:
PORTABLE RIGHT KNEE - 1-2 VIEW

[knee lat (1 of 2)]
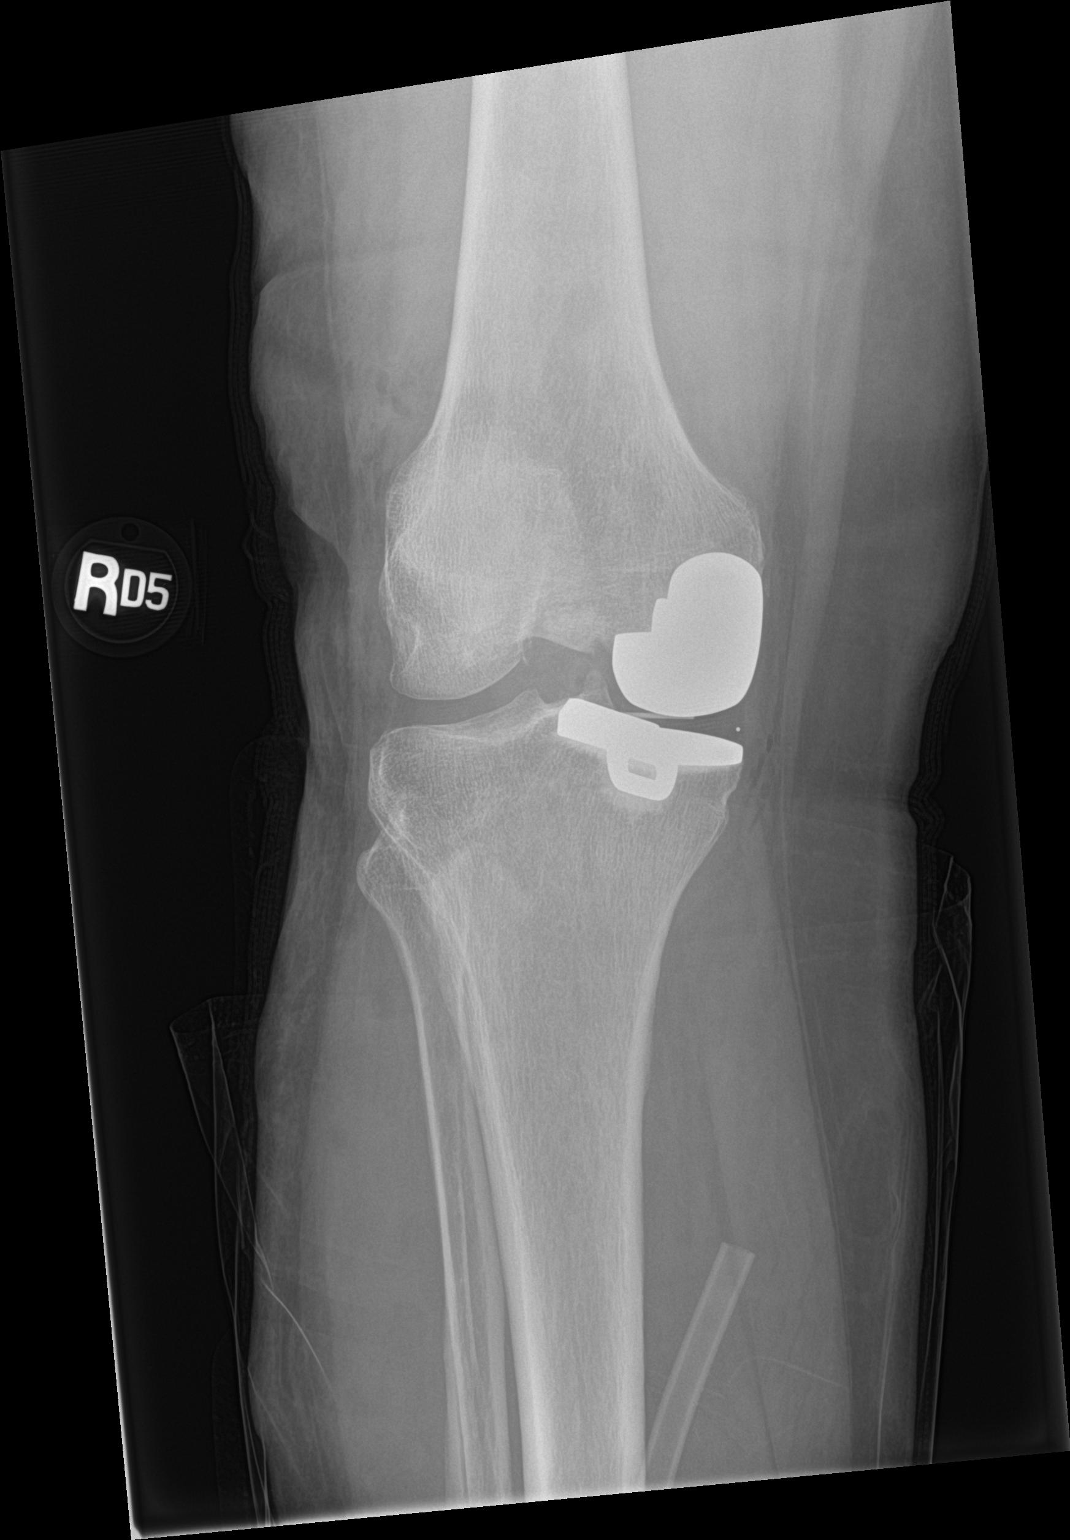

[knee lat (2 of 2)]
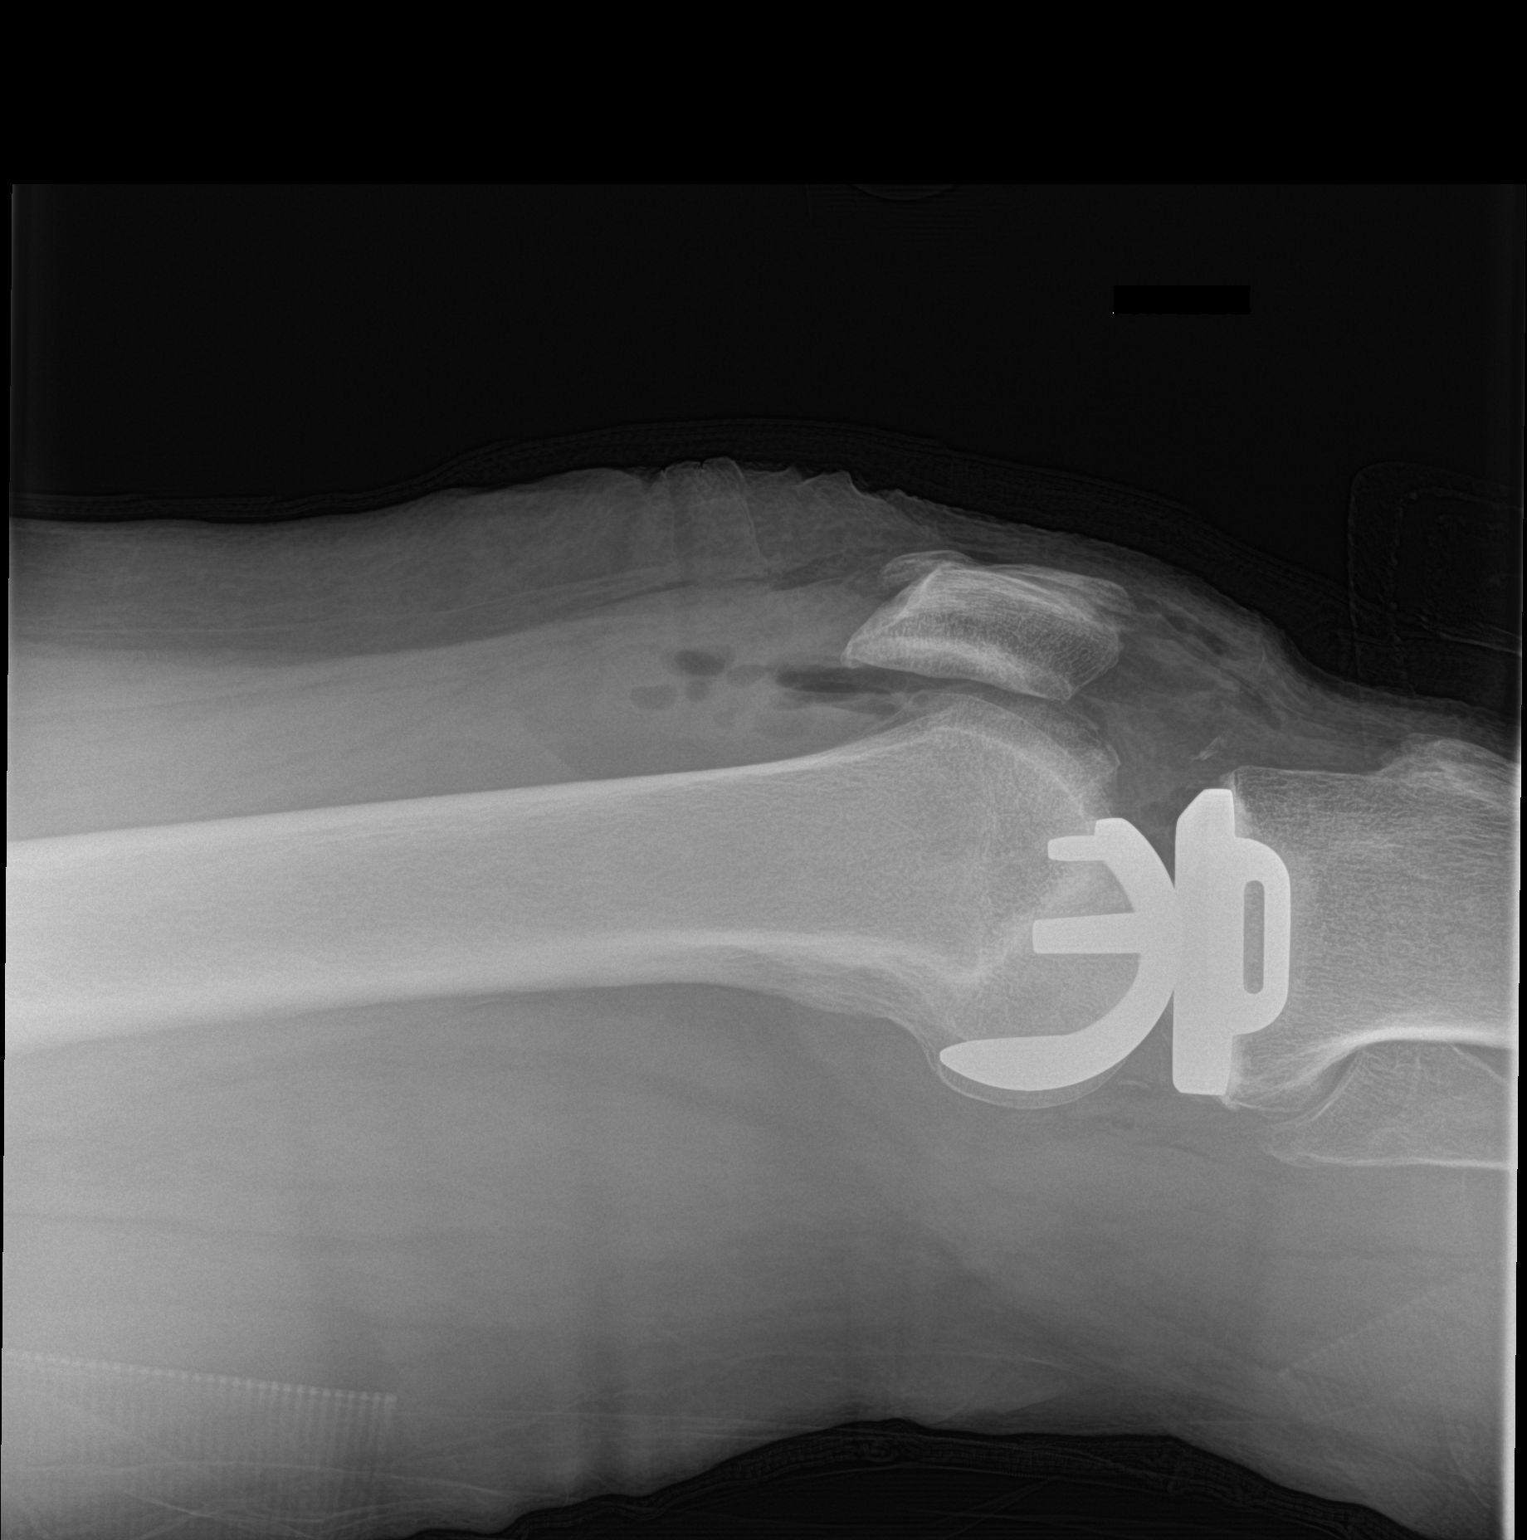

[2 of 2 positions shown; findings below may reference images not displayed]

FINDINGS: Medial hemiarthroplasty. Hardware intact. Anatomic alignment. No
acute bony abnormality.
IMPRESSION: Hemiarthroplasty medial aspect right knee.  Anatomic alignment.

## 2019-11-30 ENCOUNTER — Other Ambulatory Visit: Payer: Self-pay | Admitting: Family Medicine

## 2019-12-01 NOTE — Telephone Encounter (Signed)
Refill request Lyrica Last refill 09/18/19 #90/1 Last office visit 03/10/19

## 2019-12-11 ENCOUNTER — Other Ambulatory Visit: Payer: Self-pay | Admitting: Family Medicine

## 2019-12-11 NOTE — Telephone Encounter (Signed)
Last office visit 04/25/2019 for cellulitis and abscess of neck. Last refilled 11/13/2019 for #180 with no refills.  No future appointments.

## 2019-12-18 ENCOUNTER — Other Ambulatory Visit: Payer: Self-pay | Admitting: Family Medicine

## 2019-12-18 NOTE — Telephone Encounter (Signed)
Last office visit 04/25/2019 for cellulitis and abscess of neck.  Last refilled 06/07/2019 for #90 with no refills.  No future appointments.

## 2019-12-25 NOTE — Telephone Encounter (Signed)
My Chart message was unready.  I called and left message for Bethena Roys per MyChart:  Given you are on tramadol, a controlled substance we need to have a follow up on chronic pain at least every 6 months for continued refills. I have refilled you tramadol but please call to make an appointment.Marland Kitchen it is okay to do a virtual visit if you prefer.  I ask Ms. Cherry to schedule follow up appointment prior to her next Tramadol refill.

## 2020-01-04 ENCOUNTER — Other Ambulatory Visit: Payer: Self-pay | Admitting: Family Medicine

## 2020-01-05 DIAGNOSIS — G4733 Obstructive sleep apnea (adult) (pediatric): Secondary | ICD-10-CM | POA: Diagnosis not present

## 2020-01-08 ENCOUNTER — Other Ambulatory Visit: Payer: Self-pay | Admitting: Family Medicine

## 2020-01-08 NOTE — Telephone Encounter (Signed)
Last office visit 04/25/2019 for cellulitis and abscess on neck.  Last refilled 12/11/2019 for #180 with no refills.  Next Appt: 01/23/2020 for follow up.

## 2020-01-23 ENCOUNTER — Encounter: Payer: Self-pay | Admitting: Family Medicine

## 2020-01-23 ENCOUNTER — Other Ambulatory Visit: Payer: Self-pay

## 2020-01-23 ENCOUNTER — Ambulatory Visit (INDEPENDENT_AMBULATORY_CARE_PROVIDER_SITE_OTHER): Payer: Medicare Other | Admitting: Family Medicine

## 2020-01-23 VITALS — BP 120/70 | HR 86 | Temp 97.2°F | Ht 68.0 in | Wt 228.8 lb

## 2020-01-23 DIAGNOSIS — M797 Fibromyalgia: Secondary | ICD-10-CM | POA: Diagnosis not present

## 2020-01-23 DIAGNOSIS — L608 Other nail disorders: Secondary | ICD-10-CM

## 2020-01-23 NOTE — Progress Notes (Signed)
Chief Complaint  Patient presents with  . Pain Management    Tramadol    History of Present Illness: HPI   64 year old female presents for chronic pain management for fibromyalgia  She is feeling better overall. Indication for chronic pain med: Now adequate  on cymbalta, wellbutrin, lyrica (three times a day),  Robaxin, tramadol (needed three times a day) and vitamin supplements.  Turmeric. Pt tearful about the pain. Medication and dose: tramadol 2 tabs TID # pills per month: 180 Last UDS date: not indicated [place] reviewed this encounter (include red flags):   01/23/20   01/2019 Dr. Mardelle Matte right partial knee replacement.... her knee pain improved.   She is now semi retirement... working less.  She has been seeing Derm Dr. Venetia Maxon... for rash...  She wonder if it is   Psoriasis and if all her body pain and arthritis symptoms are due to psoriatic arthritis.  Derm thinks it is seb derm or other   But now she has noted nail pitting... no hand arthritis, no sausage fingers.    This visit occurred during the SARS-CoV-2 public health emergency.  Safety protocols were in place, including screening questions prior to the visit, additional usage of staff PPE, and extensive cleaning of exam room while observing appropriate contact time as indicated for disinfecting solutions.   COVID 19 screen:  No recent travel or known exposure to COVID19 The patient denies respiratory symptoms of COVID 19 at this time. The importance of social distancing was discussed today.     Review of Systems  Constitutional: Negative for chills and fever.  HENT: Negative for congestion and ear pain.   Eyes: Negative for pain and redness.  Respiratory: Negative for cough and shortness of breath.   Cardiovascular: Negative for chest pain, palpitations and leg swelling.  Gastrointestinal: Negative for abdominal pain, blood in stool, constipation, diarrhea, nausea and vomiting.  Genitourinary: Negative for  dysuria.  Musculoskeletal: Negative for falls and myalgias.  Skin: Negative for rash.  Neurological: Negative for dizziness.  Psychiatric/Behavioral: Negative for depression. The patient is not nervous/anxious.       Past Medical History:  Diagnosis Date  . Cancer ([place])    basal cell 2016  . Fibromyalgia   . Pre-diabetes   . Primary localized osteoarthritis of right knee 02/07/2019  . Sleep apnea    uses CPAP    reports that she quit smoking about 27 years ago. Her smoking use included cigarettes. She has a 15.00 pack-year smoking history. She has never used smokeless tobacco. She reports that she does not drink alcohol and does not use drugs.   Current Outpatient Medications:  .  buPROPion (WELLBUTRIN XL) 150 MG 24 hr tablet, TAKE 1 TABLET(150 MG) BY MOUTH DAILY, Disp: 90 tablet, Rfl: 0 .  cholecalciferol (VITAMIN D) 400 units TABS tablet, Take 400 Units by mouth daily. , Disp: , Rfl:  .  Coenzyme Q10 (CO Q 10) 100 MG CAPS, Take 100 mg by mouth daily. , Disp: , Rfl:  .  DULoxetine (CYMBALTA) 30 MG capsule, TAKE 3 CAPSULES BY MOUTH EVERY DAY, Disp: 270 capsule, Rfl: 0 .  Magnesium Malate 1250 (141.7 Mg) MG TABS, Take 141.7-283.4 mg by mouth See admin instructions. Take 141.7mg  by mouth every morning and take 283.4mg  every evening, Disp: , Rfl:  .  methocarbamol (ROBAXIN) 500 MG tablet, TAKE 1 TABLET(500 MG) BY MOUTH THREE TIMES DAILY AS NEEDED FOR MUSCLE SPASMS, Disp: 90 tablet, Rfl: 0 .  pregabalin (LYRICA) 150 MG  capsule, TAKE 1 CAPSULE(150 MG) BY MOUTH THREE TIMES DAILY, Disp: 90 capsule, Rfl: 1 .  traMADol (ULTRAM) 50 MG tablet, TAKE 2 TABLETS(100 MG) BY MOUTH THREE TIMES DAILY, Disp: 180 tablet, Rfl: 0 .  fish oil-omega-3 fatty acids 1000 MG capsule, Take 4,000 mg by mouth daily.  (Patient not taking: Reported on 01/23/2020), Disp: , Rfl:    Observations/Objective: Blood pressure 120/70, pulse 86, temperature (!) 97.2 F (36.2 C), temperature source Temporal, height 5\' 8"  (1.727  m), weight 228 lb 12 oz (103.8 kg), SpO2 94 %.  Physical Exam Constitutional:      General: She is not in acute distress.    Appearance: Normal appearance. She is well-developed. She is not ill-appearing or toxic-appearing.  HENT:     Head: Normocephalic.     Right Ear: Hearing, tympanic membrane, ear canal and external ear normal. Tympanic membrane is not erythematous, retracted or bulging.     Left Ear: Hearing, tympanic membrane, ear canal and external ear normal. Tympanic membrane is not erythematous, retracted or bulging.     Nose: No mucosal edema or rhinorrhea.     Right Sinus: No maxillary sinus tenderness or frontal sinus tenderness.     Left Sinus: No maxillary sinus tenderness or frontal sinus tenderness.     Mouth/Throat:     Pharynx: Uvula midline.  Eyes:     General: Lids are normal. Lids are everted, no foreign bodies appreciated.     Conjunctiva/sclera: Conjunctivae normal.     Pupils: Pupils are equal, round, and reactive to light.  Neck:     Thyroid: No thyroid mass or thyromegaly.     Vascular: No carotid bruit.     Trachea: Trachea normal.  Cardiovascular:     Rate and Rhythm: Normal rate and regular rhythm.     Pulses: Normal pulses.     Heart sounds: Normal heart sounds, S1 normal and S2 normal. No murmur heard.  No friction rub. No gallop.   Pulmonary:     Effort: Pulmonary effort is normal. No tachypnea or respiratory distress.     Breath sounds: Normal breath sounds. No decreased breath sounds, wheezing, rhonchi or rales.  Abdominal:     General: Bowel sounds are normal.     Palpations: Abdomen is soft.     Tenderness: There is no abdominal tenderness.  Musculoskeletal:     Cervical back: Normal range of motion and neck supple.  Skin:    General: Skin is warm and dry.     Findings: No rash.     Comments: Flaky red patch at top of buttock crease, scaly rash on scalp, pitting in 2 nails on left hand  Neurological:     Mental Status: She is alert.   Psychiatric:        Mood and Affect: Mood is not anxious or depressed.        Speech: Speech normal.        Behavior: Behavior normal. Behavior is cooperative.        Thought Content: Thought content normal.        Judgment: Judgment normal.      Assessment and Plan  Fibromyalgia Improved control with more self care after semi retirement.  Working on weight loss and exercise.  Decrease Lyrica to twice daily.    Pitting of nails Refer back to Dr. Estanislado Pandy for consideration of psoriasis as cause of body pain and arthritis.  No red flags for tramadol use.. refill when out.   Tamotsu Wiederholt  Diona Browner, MD

## 2020-01-23 NOTE — Assessment & Plan Note (Signed)
Refer back to Dr. Estanislado Pandy for consideration of psoriasis as cause of body pain and arthritis.

## 2020-01-23 NOTE — Assessment & Plan Note (Signed)
Improved control with more self care after semi retirement.  Working on weight loss and exercise.  Decrease Lyrica to twice daily.

## 2020-01-23 NOTE — Patient Instructions (Signed)
Decrease Lyrica to 2 tabs daily.  Increase exercise and weight loss. Call Dr. Estanislado Pandy for consideration of psoriatic arthritis.

## 2020-01-24 ENCOUNTER — Other Ambulatory Visit: Payer: Self-pay | Admitting: *Deleted

## 2020-01-24 NOTE — Telephone Encounter (Signed)
Last office visit 01/23/2020 for pain management.  Patient is switching to mail order pharmacy [facility] and needs prescriptions for all her medication for 90 day supply.

## 2020-01-25 MED ORDER — METHOCARBAMOL 500 MG PO TABS
ORAL_TABLET | ORAL | 0 refills | Status: DC
Start: 2020-01-25 — End: 2020-04-01

## 2020-01-25 MED ORDER — PREGABALIN 150 MG PO CAPS
ORAL_CAPSULE | ORAL | 0 refills | Status: DC
Start: 2020-01-25 — End: 2020-02-06

## 2020-01-25 MED ORDER — DULOXETINE HCL 30 MG PO CPEP
90.0000 mg | ORAL_CAPSULE | Freq: Every day | ORAL | 0 refills | Status: DC
Start: 2020-01-25 — End: 2020-04-01

## 2020-01-25 MED ORDER — BUPROPION HCL ER (XL) 150 MG PO TB24
ORAL_TABLET | ORAL | 1 refills | Status: DC
Start: 2020-01-25 — End: 2020-04-08

## 2020-01-25 MED ORDER — TRAMADOL HCL 50 MG PO TABS
ORAL_TABLET | ORAL | 0 refills | Status: DC
Start: 2020-01-25 — End: 2020-03-01

## 2020-02-05 ENCOUNTER — Telehealth: Payer: Self-pay | Admitting: *Deleted

## 2020-02-05 NOTE — Telephone Encounter (Signed)
Received faxed PA request questionnaire from [facility] for Duloxetine 30 mg.  Forms completed and faxed back to [facility] at [phone]. PA was approved through 03/29/2021.

## 2020-02-06 ENCOUNTER — Telehealth: Payer: Self-pay

## 2020-02-06 DIAGNOSIS — H40013 Open angle with borderline findings, low risk, bilateral: Secondary | ICD-10-CM | POA: Diagnosis not present

## 2020-02-06 DIAGNOSIS — L608 Other nail disorders: Secondary | ICD-10-CM

## 2020-02-06 DIAGNOSIS — M797 Fibromyalgia: Secondary | ICD-10-CM

## 2020-02-06 MED ORDER — PREGABALIN 150 MG PO CAPS
ORAL_CAPSULE | ORAL | 0 refills | Status: DC
Start: 2020-02-06 — End: 2020-04-01

## 2020-02-06 NOTE — Telephone Encounter (Signed)
Will have Dr. Diona Browner resend new Lyrica Rx with dosing of 1 tablet bid and place referral for Dr. Estanislado Pandy.  PA was done for the Cymbalta and approved so [facility] should be shipping that medication out.

## 2020-02-06 NOTE — Telephone Encounter (Signed)
Pt left v/m with 3 issues for Butch Penny CMA; Dr Estanislado Pandy does need a referral before pt can be seen; pt said lyrica 150 mg was changed to bid and [facility] still has lyrica 150 mg tid; and pharmacy will not send cymbalta before gets additional info. Pt request cb from [facility]. (See PA from 02/05/20) also see office note from 01/23/20. Sending note to Butch Penny CMA for med issues and to Dr Diona Browner for Dr Estanislado Pandy referral.

## 2020-02-06 NOTE — Telephone Encounter (Signed)
Sent in new rx and placed referral to Dr. Estanislado Pandy.

## 2020-03-01 ENCOUNTER — Other Ambulatory Visit: Payer: Self-pay | Admitting: Family Medicine

## 2020-03-01 NOTE — Telephone Encounter (Signed)
Last office visit 01/23/2020 for pain management.  Last refilled 01/25/2020 for #180 with no refills.  No future appointments with PCP.

## 2020-03-08 DIAGNOSIS — H0279 Other degenerative disorders of eyelid and periocular area: Secondary | ICD-10-CM | POA: Diagnosis not present

## 2020-03-08 DIAGNOSIS — H02834 Dermatochalasis of left upper eyelid: Secondary | ICD-10-CM | POA: Diagnosis not present

## 2020-03-08 DIAGNOSIS — H02423 Myogenic ptosis of bilateral eyelids: Secondary | ICD-10-CM | POA: Diagnosis not present

## 2020-03-08 DIAGNOSIS — H02413 Mechanical ptosis of bilateral eyelids: Secondary | ICD-10-CM | POA: Diagnosis not present

## 2020-03-08 DIAGNOSIS — H02831 Dermatochalasis of right upper eyelid: Secondary | ICD-10-CM | POA: Diagnosis not present

## 2020-03-13 DIAGNOSIS — Z96653 Presence of artificial knee joint, bilateral: Secondary | ICD-10-CM | POA: Diagnosis not present

## 2020-03-29 ENCOUNTER — Other Ambulatory Visit: Payer: Self-pay | Admitting: Family Medicine

## 2020-03-31 NOTE — Progress Notes (Signed)
Office Visit Note  Patient: Jillian [name]             Date of Birth: 10-28-1955           MRN: [id number]             PCP: Jinny Sanders, MD Referring: Jinny Sanders, MD Visit Date: 04/08/2020 Occupation: '@GUAROCC' @  Subjective:  Pain in multiple joints.   History of Present Illness: Jillian [name] is a 65 y.o. female seen in consultation per request of her PCP.  According the patient she saw me in 2008 and was diagnosed with fibromyalgia.  At the time she was started on Lyrica, Ultram, Wellbutrin, Lunesta and methocarbamol.  She states she presented with generalized pain and discomfort.  She states since then she has been under care of Dr. Diona Browner and she has been treating her for fibromyalgia syndrome.  Her fibromyalgia syndrome is very well controlled on the current medications.  She states her symptoms get worse with low barometric pressure, with increased fatigue and pain.  In October 2020 she started having increased right knee joint pain and underwent right partial knee replacement by Dr. Mardelle Matte.  She had left partial knee replacement in December 2020 and had good results.  She states postoperatively she developed a rash in the nape of her neck and in her ear canals.  She states is very itchy and flaky.  She was seen by Dr. Venetia Maxon who felt it was not psoriasis and treated her with topical agents.  She also gives history of rash under her breast, axillary and inguinal region.  Which was also treated with the steroids.  She states she was given antifungal treatment which did not work for her.  She gets areas of localized intense itching which moves around.  Recently she has also noticed some nail pitting which has been getting worse over time.  She recalls developing cellulitis on her face in September 2020 and then another episode of cellulitis 6 months later on her neck.  Both were treated with antibiotics.  She states she continues to have some pain and discomfort in her bilateral hands  and her bilateral feet.  There is positive family history of psoriatic arthritis in her sister who was treated with Tremfya in the past.  And her brother is on methotrexate for psoriasis and psoriatic arthritis.  She is gravida 2, para 2 miscarriages zero  Activities of Daily Living:  Patient reports morning stiffness for several hours to all day. Patient Reports nocturnal pain.  Difficulty dressing/grooming: Denies Difficulty climbing stairs: Denies Difficulty getting out of chair: Denies Difficulty using hands for taps, buttons, cutlery, and/or writing: Denies  Review of Systems  Constitutional: Negative for fatigue, night sweats, weight gain and weight loss.  HENT: Negative for mouth sores, trouble swallowing, trouble swallowing, mouth dryness and nose dryness.   Eyes: Positive for redness and itching. Negative for pain, visual disturbance and dryness.  Respiratory: Negative for cough, shortness of breath and difficulty breathing.   Cardiovascular: Negative for chest pain, palpitations, hypertension, irregular heartbeat and swelling in legs/feet.  Gastrointestinal: Negative for blood in stool, constipation and diarrhea.  Endocrine: Negative for increased urination.  Genitourinary: Negative for difficulty urinating and vaginal dryness.  Musculoskeletal: Positive for arthralgias, joint pain, myalgias, morning stiffness, muscle tenderness and myalgias. Negative for joint swelling and muscle weakness.  Skin: Positive for rash. Negative for color change, hair loss, skin tightness, ulcers and sensitivity to sunlight.  Finger nail pitting   Allergic/Immunologic: Negative for susceptible to infections.  Neurological: Negative for dizziness, numbness, headaches, memory loss, night sweats and weakness.  Hematological: Negative for bruising/bleeding tendency and swollen glands.  Psychiatric/Behavioral: Negative for depressed mood, confusion and sleep disturbance. The patient is not  nervous/anxious.     PMFS History:  Patient Active Problem List   Diagnosis Date Noted  . Pitting of nails 01/23/2020  . OSA (obstructive sleep apnea) 04/12/2019  . S/P left unicompartmental knee replacement 03/21/2019  . Osteoarthritis of left knee 03/09/2019  . Primary localized osteoarthritis of right knee 02/07/2019  . Status post right partial knee replacement 02/07/2019  . Cellulitis and abscess of neck 12/23/2018  . Weight gain 07/14/2018  . Class 1 obesity due to excess calories without serious comorbidity with body mass index (BMI) of 33.0 to 33.9 in adult 05/13/2018  . Prediabetes 12/03/2017  . Non-restorative sleep 02/27/2016  . Vitamin D deficiency 10/28/2015  . Generalized anxiety disorder 11/25/2012  . HYPERTRIGLYCERIDEMIA 12/20/2008  . MENOPAUSE, EARLY 10/04/2008  . Fibromyalgia 10/04/2008    Past Medical History:  Diagnosis Date  . Cancer ([place])    basal cell 2016  . Dermatitis    per patient   . Fibromyalgia   . Pre-diabetes   . Primary localized osteoarthritis of right knee 02/07/2019  . Sleep apnea    uses CPAP    Family History  Problem Relation Age of Onset  . Cancer Father        colon  . Hyperlipidemia Father   . Hypertension Father   . Diabetes Sister   . Cancer Paternal Aunt        colon  . Cancer Maternal Grandmother        colon  . Hypertension Maternal Grandmother   . Anuerysm Paternal Grandfather   . Psoriasis Brother   . Arthritis Brother   . Psoriasis Sister   . Osteoarthritis Brother   . Diabetes Brother   . Throat cancer Brother   . Healthy Son   . Healthy Daughter    Past Surgical History:  Procedure Laterality Date  . CESAREAN SECTION     times 2  . HERNIA REPAIR    . PARTIAL KNEE ARTHROPLASTY Right 02/07/2019   Procedure: UNICOMPARTMENTAL KNEE;  Surgeon: Marchia Bond, MD;  Location: [facility];  Service: Orthopedics;  Laterality: Right;  . PARTIAL KNEE ARTHROPLASTY Left 03/21/2019   Procedure: UNICOMPARTMENTAL KNEE;   Surgeon: Marchia Bond, MD;  Location: [facility];  Service: Orthopedics;  Laterality: Left;   Social History   Social History Narrative   Regular exercise-yes, but seasonal walks and swims   Diet: healthy, low carbohydrates, no caffeine   Immunization History  Administered Date(s) Administered  . Moderna Sars-Covid-2 Vaccination 05/23/2019, 07/04/2019  . Td 03/30/2004  . Tdap 10/28/2015  . Zoster 08/30/2014     Objective: Vital Signs: BP 118/76 (BP Location: Right Arm, Patient Position: Sitting, Cuff Size: Large)   Pulse 85   Resp 16   Ht '5\' 8"'  (1.727 m)   Wt 239 lb 12.8 oz (108.8 kg)   BMI 36.46 kg/m    Physical Exam Vitals and nursing note reviewed.  Constitutional:      Appearance: She is well-developed and well-nourished.  HENT:     Head: Normocephalic and atraumatic.  Eyes:     Extraocular Movements: EOM normal.     Conjunctiva/sclera: Conjunctivae normal.  Cardiovascular:     Rate and Rhythm: Normal rate and regular rhythm.  Pulses: Intact distal pulses.     Heart sounds: Normal heart sounds.  Pulmonary:     Effort: Pulmonary effort is normal.     Breath sounds: Normal breath sounds.  Abdominal:     General: Bowel sounds are normal.     Palpations: Abdomen is soft.  Musculoskeletal:     Cervical back: Normal range of motion.  Lymphadenopathy:     Cervical: No cervical adenopathy.  Skin:    General: Skin is warm and dry.     Capillary Refill: Capillary refill takes less than 2 seconds.     Comments: Erythematous scales were noted on the nape of the neck, erythema was noted in the ear canals, dry scales were noted on the palmar aspect of her left hand.  Nail pitting was noted in her bilateral hands.  Erythematous rash was noted in the gluteal cleft.  Neurological:     Mental Status: She is alert and oriented to person, place, and time.  Psychiatric:        Mood and Affect: Mood and affect normal.        Behavior: Behavior normal.      Musculoskeletal  Exam: C-spine thoracic and lumbar spine were in good range of motion.  She had mild tenderness over SI joints.  Shoulder joints, elbow joints, wrist joints with good range of motion.  She had no tenderness over CMC's or MCPs.  PIP and DIP prominence with no synovitis was noted.  She had good range of motion of her hip joints.  Both knees are partially replaced without any warmth swelling or effusion.  There was no tenderness over ankle joints, MCPs PIPs or DIPs.  PIP and DIP prominence was noted.  There was no Achilles tendinitis or plantar fasciitis.  CDAI Exam: CDAI Score: -- Patient Global: --; Provider Global: -- Swollen: --; Tender: -- Joint Exam 04/08/2020   No joint exam has been documented for this visit   There is currently no information documented on the homunculus. Go to the Rheumatology activity and complete the homunculus joint exam.  Investigation: No additional findings.  Imaging: No results found.  Recent Labs: Lab Results  Component Value Date   WBC 14.0 (H) 04/19/2019   HGB 11.8 (L) 04/19/2019   PLT 219 04/19/2019   NA 137 03/15/2019   K 5.2 (H) 03/15/2019   CL 105 03/15/2019   CO2 27 03/15/2019   GLUCOSE 109 (H) 03/15/2019   BUN 15 03/15/2019   CREATININE 0.88 03/15/2019   BILITOT 0.3 03/03/2019   ALKPHOS 112 03/03/2019   AST 17 03/03/2019   ALT 20 03/03/2019   PROT 6.3 03/03/2019   ALBUMIN 3.9 03/03/2019   CALCIUM 9.5 03/15/2019   GFRAA >60 03/15/2019    Speciality Comments: No specialty comments available.  Procedures:  No procedures performed Allergies: Patient has no known allergies.   Assessment / Plan:     Visit Diagnoses: Pain in both hands -she complains of pain and discomfort in her bilateral hands.  She denies any swelling.  No synovitis was noted.  Nail pitting was noted in her bilateral hands.  She had dry scales on her left palm.  Plan: XR Hand 2 View Right, XR Hand 2 View Left, x-rays were consistent with osteoarthritis involving CMC,  PIP and DIP joints.  Rheumatoid factor, Sedimentation rate, Cyclic citrul peptide antibody, IgG, HLA-B27 antigen.  She denies any history of uveitis, iritis, Achilles tendinitis.  She states she sees ophthalmologist on a regular basis.  Pain  in both feet -she complains of pain and discomfort in her bilateral feet.  She states she had an episode of right plantar fasciitis.  No synovitis was noted on my examination today.  Plan: XR Foot 2 Views Right, XR Foot 2 Views Left.  X-rays were consistent with osteoarthritis.  Chronic SI joint pain -she has some tenderness over SI joints.  Plan: XR Pelvis 1-2 Views.  X-ray of SI joints were unremarkable.  Status post right partial knee replacement-February 07, 2019 by Dr. Mardelle Matte  S/P left unicompartmental knee replacement-March 21, 2019 by Dr. Pura Spice of nails-nail pitting was noted in her hands.  Rash -patient has rash on the nape of her neck, in the ear canals, left, gluteal cleft.  She also has nail pitting.  This appears to be consistent with psoriasis.  She is followed by Dr. Venetia Maxon.  I have advised her to schedule an appointment with Dr. Venetia Maxon to confirm the diagnosis of psoriasis.  She also has a strong family history of psoriasis and psoriatic arthritis.  Family history of psoriatic arthritis - Sister and brother  Fibromyalgia-she continues to have some generalized pain and discomfort and positive tender points.  She has been followed by Dr. Diona Browner.  OSA (obstructive sleep apnea)  Non-restorative sleep  Vitamin D deficiency  Prediabetes  HYPERTRIGLYCERIDEMIA  Generalized anxiety disorder  Cellulitis and abscess of neck    Orders: Orders Placed This Encounter  Procedures  . XR Hand 2 View Right  . XR Hand 2 View Left  . XR Foot 2 Views Right  . XR Foot 2 Views Left  . XR Pelvis 1-2 Views  . Rheumatoid factor  . Sedimentation rate  . Cyclic citrul peptide antibody, IgG  . HLA-B27 antigen   No orders of the  defined types were placed in this encounter.    Follow-Up Instructions: Return for Joint pain.   Bo Merino, MD  Note - This record has been created using Editor, commissioning.  Chart creation errors have been sought, but may not always  have been located. Such creation errors do not reflect on  the standard of medical care.

## 2020-04-01 ENCOUNTER — Telehealth: Payer: Self-pay

## 2020-04-01 ENCOUNTER — Other Ambulatory Visit: Payer: Self-pay | Admitting: Family Medicine

## 2020-04-01 NOTE — Telephone Encounter (Signed)
Received record request from [facility] processing center. Request has been faxed to [employer].

## 2020-04-01 NOTE — Telephone Encounter (Signed)
Last office visit 01/23/2020 for pain management.  Last refilled Lyrica 02/06/2020 for #180 with no refills.  Tramadol 03/01/2020 for #180 with no refills.  No future appointments with PCP.

## 2020-04-01 NOTE — Telephone Encounter (Signed)
Last office visit 01/23/2020 for pain management.  Last refilled 01/25/2020 for #270 with no refills.  No future appointments with PCP.

## 2020-04-06 ENCOUNTER — Other Ambulatory Visit: Payer: Self-pay | Admitting: Family Medicine

## 2020-04-08 ENCOUNTER — Ambulatory Visit: Payer: Medicare Other | Admitting: Rheumatology

## 2020-04-08 ENCOUNTER — Other Ambulatory Visit: Payer: Self-pay

## 2020-04-08 ENCOUNTER — Ambulatory Visit: Payer: Self-pay

## 2020-04-08 ENCOUNTER — Encounter: Payer: Self-pay | Admitting: Rheumatology

## 2020-04-08 VITALS — BP 118/76 | HR 85 | Resp 16 | Ht 68.0 in | Wt 239.8 lb

## 2020-04-08 DIAGNOSIS — G8929 Other chronic pain: Secondary | ICD-10-CM | POA: Diagnosis not present

## 2020-04-08 DIAGNOSIS — Z96652 Presence of left artificial knee joint: Secondary | ICD-10-CM | POA: Diagnosis not present

## 2020-04-08 DIAGNOSIS — M79642 Pain in left hand: Secondary | ICD-10-CM

## 2020-04-08 DIAGNOSIS — E559 Vitamin D deficiency, unspecified: Secondary | ICD-10-CM

## 2020-04-08 DIAGNOSIS — E781 Pure hyperglyceridemia: Secondary | ICD-10-CM

## 2020-04-08 DIAGNOSIS — L0211 Cutaneous abscess of neck: Secondary | ICD-10-CM

## 2020-04-08 DIAGNOSIS — M797 Fibromyalgia: Secondary | ICD-10-CM

## 2020-04-08 DIAGNOSIS — R21 Rash and other nonspecific skin eruption: Secondary | ICD-10-CM | POA: Diagnosis not present

## 2020-04-08 DIAGNOSIS — L608 Other nail disorders: Secondary | ICD-10-CM | POA: Diagnosis not present

## 2020-04-08 DIAGNOSIS — L03221 Cellulitis of neck: Secondary | ICD-10-CM

## 2020-04-08 DIAGNOSIS — R7303 Prediabetes: Secondary | ICD-10-CM

## 2020-04-08 DIAGNOSIS — F411 Generalized anxiety disorder: Secondary | ICD-10-CM

## 2020-04-08 DIAGNOSIS — M79641 Pain in right hand: Secondary | ICD-10-CM | POA: Diagnosis not present

## 2020-04-08 DIAGNOSIS — G478 Other sleep disorders: Secondary | ICD-10-CM

## 2020-04-08 DIAGNOSIS — M79671 Pain in right foot: Secondary | ICD-10-CM

## 2020-04-08 DIAGNOSIS — M533 Sacrococcygeal disorders, not elsewhere classified: Secondary | ICD-10-CM

## 2020-04-08 DIAGNOSIS — Z8261 Family history of arthritis: Secondary | ICD-10-CM

## 2020-04-08 DIAGNOSIS — M79672 Pain in left foot: Secondary | ICD-10-CM | POA: Diagnosis not present

## 2020-04-08 DIAGNOSIS — Z96651 Presence of right artificial knee joint: Secondary | ICD-10-CM

## 2020-04-08 DIAGNOSIS — G4733 Obstructive sleep apnea (adult) (pediatric): Secondary | ICD-10-CM | POA: Diagnosis not present

## 2020-04-08 DIAGNOSIS — Z840 Family history of diseases of the skin and subcutaneous tissue: Secondary | ICD-10-CM

## 2020-04-10 LAB — SEDIMENTATION RATE: Sed Rate: 11 mm/h (ref 0–30)

## 2020-04-10 LAB — RHEUMATOID FACTOR: Rheumatoid fact SerPl-aCnc: <14 IU/mL (ref <14)

## 2020-04-10 LAB — CYCLIC CITRUL PEPTIDE ANTIBODY, IGG: Cyclic Citrullin Peptide Ab: <16 UNITS

## 2020-04-10 LAB — HLA-B27 ANTIGEN: HLA-B27 Antigen: NEGATIVE

## 2020-04-10 NOTE — Progress Notes (Signed)
All the labs are within normal limits.  I will discuss results at the follow-up visit.

## 2020-04-24 DIAGNOSIS — G4730 Sleep apnea, unspecified: Secondary | ICD-10-CM | POA: Diagnosis not present

## 2020-04-24 DIAGNOSIS — H02403 Unspecified ptosis of bilateral eyelids: Secondary | ICD-10-CM | POA: Diagnosis not present

## 2020-04-24 DIAGNOSIS — H02413 Mechanical ptosis of bilateral eyelids: Secondary | ICD-10-CM | POA: Diagnosis not present

## 2020-04-24 DIAGNOSIS — Z9989 Dependence on other enabling machines and devices: Secondary | ICD-10-CM | POA: Diagnosis not present

## 2020-04-24 DIAGNOSIS — H02423 Myogenic ptosis of bilateral eyelids: Secondary | ICD-10-CM | POA: Diagnosis not present

## 2020-04-24 DIAGNOSIS — M797 Fibromyalgia: Secondary | ICD-10-CM | POA: Diagnosis not present

## 2020-04-24 HISTORY — PX: EYE SURGERY: SHX253

## 2020-04-27 ENCOUNTER — Other Ambulatory Visit: Payer: Self-pay | Admitting: Family Medicine

## 2020-04-29 NOTE — Telephone Encounter (Signed)
Last office visit 01/23/2020 for pain management.  Last refilled 04/01/2020 for #180 with no refills.  No future appointments with PCP.

## 2020-04-30 ENCOUNTER — Other Ambulatory Visit: Payer: Self-pay | Admitting: Family Medicine

## 2020-04-30 NOTE — Telephone Encounter (Signed)
Last office visit 01/23/2020 for pain management.  Last refilled 04/01/2020 for #180 with no refills.  No future appointment with PCP.

## 2020-05-04 NOTE — Progress Notes (Unsigned)
Office Visit Note  Patient: Jillian [name]             Date of Birth: 1956/01/27           MRN: [id number]             PCP: Jinny Sanders, MD Referring: Jinny Sanders, MD Visit Date: 05/06/2020 Occupation: '@GUAROCC' @  Subjective:  Pain in multiple joints.   History of Present Illness: Jillian [name] is a 65 y.o. female with the history of pain in multiple joints and rash.  She continues to have some pain and discomfort in her bilateral hands, SI joints, knee joints and her feet.  She denies any history of joint swelling.  She continues to have rash in her ear canals, palms and the gluteal region.  She has appointment coming up with Dr. Venetia Maxon in May.  Activities of Daily Living:  Patient reports morning stiffness for 1-6 hours.   Patient Reports nocturnal pain.  Difficulty dressing/grooming: Denies Difficulty climbing stairs: Denies Difficulty getting out of chair: Denies Difficulty using hands for taps, buttons, cutlery, and/or writing: Denies  Review of Systems  Constitutional: Positive for fatigue.  HENT: Negative for mouth sores, mouth dryness and nose dryness.   Eyes: Negative for pain, itching and dryness.  Respiratory: Negative for shortness of breath and difficulty breathing.   Cardiovascular: Negative for chest pain and palpitations.  Gastrointestinal: Positive for constipation. Negative for blood in stool and diarrhea.  Endocrine: Negative for increased urination.  Genitourinary: Negative for difficulty urinating and painful urination.  Musculoskeletal: Positive for arthralgias, joint pain, myalgias, morning stiffness, muscle tenderness and myalgias. Negative for joint swelling.  Skin: Positive for rash. Negative for color change and redness.  Allergic/Immunologic: Negative for susceptible to infections.  Neurological: Negative for dizziness, numbness, headaches, memory loss and weakness.  Hematological: Negative for bruising/bleeding tendency.   Psychiatric/Behavioral: Negative for confusion.    PMFS History:  Patient Active Problem List   Diagnosis Date Noted  . Pitting of nails 01/23/2020  . OSA (obstructive sleep apnea) 04/12/2019  . S/P left unicompartmental knee replacement 03/21/2019  . Osteoarthritis of left knee 03/09/2019  . Primary localized osteoarthritis of right knee 02/07/2019  . Status post right partial knee replacement 02/07/2019  . Cellulitis and abscess of neck 12/23/2018  . Weight gain 07/14/2018  . Class 1 obesity due to excess calories without serious comorbidity with body mass index (BMI) of 33.0 to 33.9 in adult 05/13/2018  . Prediabetes 12/03/2017  . Non-restorative sleep 02/27/2016  . Vitamin D deficiency 10/28/2015  . Generalized anxiety disorder 11/25/2012  . HYPERTRIGLYCERIDEMIA 12/20/2008  . MENOPAUSE, EARLY 10/04/2008  . Fibromyalgia 10/04/2008    Past Medical History:  Diagnosis Date  . Cancer ([place])    basal cell 2016  . Dermatitis    per patient   . Fibromyalgia   . Pre-diabetes   . Primary localized osteoarthritis of right knee 02/07/2019  . Sleep apnea    uses CPAP    Family History  Problem Relation Age of Onset  . Cancer Father        colon  . Hyperlipidemia Father   . Hypertension Father   . Diabetes Sister   . Cancer Paternal Aunt        colon  . Cancer Maternal Grandmother        colon  . Hypertension Maternal Grandmother   . Anuerysm Paternal Grandfather   . Psoriasis Brother   . Arthritis Brother   .  Psoriasis Sister   . Osteoarthritis Brother   . Diabetes Brother   . Throat cancer Brother   . Healthy Son   . Healthy Daughter    Past Surgical History:  Procedure Laterality Date  . CESAREAN SECTION     times 2  . EYE SURGERY Bilateral 04/24/2020   bilateral eyelids   . HERNIA REPAIR    . PARTIAL KNEE ARTHROPLASTY Right 02/07/2019   Procedure: UNICOMPARTMENTAL KNEE;  Surgeon: Marchia Bond, MD;  Location: [facility];  Service: Orthopedics;  Laterality:  Right;  . PARTIAL KNEE ARTHROPLASTY Left 03/21/2019   Procedure: UNICOMPARTMENTAL KNEE;  Surgeon: Marchia Bond, MD;  Location: [facility];  Service: Orthopedics;  Laterality: Left;   Social History   Social History Narrative   Regular exercise-yes, but seasonal walks and swims   Diet: healthy, low carbohydrates, no caffeine   Immunization History  Administered Date(s) Administered  . Moderna Sars-Covid-2 Vaccination 05/23/2019, 07/04/2019  . Td 03/30/2004  . Tdap 10/28/2015  . Zoster 08/30/2014     Objective: Vital Signs: BP 138/77 (BP Location: Left Arm, Patient Position: Sitting, Cuff Size: Normal)   Pulse 85   Resp 17   Ht '5\' 8"'  (1.727 m)   Wt 243 lb (110.2 kg)   BMI 36.95 kg/m    Physical Exam Vitals and nursing note reviewed.  Constitutional:      Appearance: She is well-developed and well-nourished.  HENT:     Head: Normocephalic and atraumatic.  Eyes:     Extraocular Movements: EOM normal.     Conjunctiva/sclera: Conjunctivae normal.  Cardiovascular:     Rate and Rhythm: Normal rate and regular rhythm.     Pulses: Intact distal pulses.     Heart sounds: Normal heart sounds.  Pulmonary:     Effort: Pulmonary effort is normal.     Breath sounds: Normal breath sounds.  Abdominal:     General: Bowel sounds are normal.     Palpations: Abdomen is soft.  Musculoskeletal:     Cervical back: Normal range of motion.  Lymphadenopathy:     Cervical: No cervical adenopathy.  Skin:    General: Skin is warm and dry.     Capillary Refill: Capillary refill takes less than 2 seconds.     Findings: Rash present.     Comments: Erythema noted in bilateral ear canals.  Dry skin was noted on her palmar aspect.  Nail pitting was noted.  Erythema noted in the gluteal cleft.  Neurological:     Mental Status: She is alert and oriented to person, place, and time.  Psychiatric:        Mood and Affect: Mood and affect normal.        Behavior: Behavior normal.       Musculoskeletal Exam: C-spine with good range of motion.  Shoulder joints, elbow joints with good range of motion.  She has mild PIP and DIP thickening with no synovitis.  Hip joints with good range of motion.  Her knee joints are replaced with some crepitus without joint swelling.  There was no tenderness over ankles, MTPs.  There is no evidence of plantar fasciitis or Achilles tendinitis.  CDAI Exam: CDAI Score: - Patient Global: -; Provider Global: - Swollen: -; Tender: - Joint Exam 05/06/2020   No joint exam has been documented for this visit   There is currently no information documented on the homunculus. Go to the Rheumatology activity and complete the homunculus joint exam.  Investigation: No additional findings.  Imaging: XR Foot 2 Views Left  Result Date: 04/08/2020 First MTP, PIP and DIP narrowing was noted.  None of the other MTP showed narrowing.  No intertarsal, tibiotalar or subtalar joint space narrowing was noted.  Dorsal spurring was noted.  Inferior calcaneal spur was noted.  No erosive changes were noted. Impression: These findings are consistent with osteoarthritis of the foot.  XR Foot 2 Views Right  Result Date: 04/08/2020 First MTP, PIP and DIP narrowing was noted.  None of the other MTP showed narrowing.  No intertarsal, tibiotalar or subtalar joint space narrowing was noted.  Dorsal spurring was noted.  Inferior calcaneal spur was noted.  No erosive changes were noted. Impression: These findings are consistent with osteoarthritis of the foot.  XR Hand 2 View Left  Result Date: 04/08/2020 CMC, PIP and DIP narrowing was noted.  No MCP, intercarpal or radiocarpal joint space narrowing was noted.  No erosive changes were noted. Impression: These findings are consistent with osteoarthritis of the hand.  XR Hand 2 View Right  Result Date: 04/08/2020 CMC, PIP and DIP narrowing was noted.  No MCP, intercarpal or radiocarpal joint space narrowing was noted.  No  erosive changes were noted. Impression: These findings are consistent with osteoarthritis of the hand.  XR Pelvis 1-2 Views  Result Date: 04/08/2020 No SI joint sclerosis or narrowing was noted.  Joint narrowing was noted. Impression: Unremarkable x-ray of the SI joints.   Recent Labs: Lab Results  Component Value Date   WBC 14.0 (H) 04/19/2019   HGB 11.8 (L) 04/19/2019   PLT 219 04/19/2019   NA 137 03/15/2019   K 5.2 (H) 03/15/2019   CL 105 03/15/2019   CO2 27 03/15/2019   GLUCOSE 109 (H) 03/15/2019   BUN 15 03/15/2019   CREATININE 0.88 03/15/2019   BILITOT 0.3 03/03/2019   ALKPHOS 112 03/03/2019   AST 17 03/03/2019   ALT 20 03/03/2019   PROT 6.3 03/03/2019   ALBUMIN 3.9 03/03/2019   CALCIUM 9.5 03/15/2019   GFRAA >60 03/15/2019   April 08, 2020 RF negative, anti-CCP negative, HLA-B27 negative, ESR 11  Speciality Comments: No specialty comments available.  Procedures:  No procedures performed Allergies: Patient has no known allergies.   Assessment / Plan:     Visit Diagnoses: Primary osteoarthritis of both hands - Clinical and radiographic findings are consistent with osteoarthritis.  She had no synovitis on examination.  Nail pitting was noted.  Primary osteoarthritis of both feet - Clinical and radiographic findings are consistent with osteoarthritis.  Patient reports one episode of plantar fasciitis.  Status post right partial knee replacement - By Dr. Mardelle Matte on February 07, 2019.  S/P left unicompartmental knee replacement - By Dr. Mardelle Matte on March 21, 2019  Chronic SI joint pain - X-rays were unremarkable.  She complains of chronic SI joint pain.  It is possible that she may have early features of psoriatic arthritis.  I will reassess the situation after her dermatology evaluation.  Pitting of nails-nail pitting was noted over bilateral ring finger nails.  Rash -most likely psoriasis.  Rash was noted on the nape of the neck, in the ear canals, gluteal  cleft.  Nail pitting was noted.  Patient was to see Dr. Venetia Maxon to evaluate for psoriasis in May.  She may benefit from [place].  Family history of psoriatic arthritis - Her sister and brother  Fibromyalgia - History of generalized pain and positive tender points.  Followed by her PCP.  Other medical problems  are listed as follows:  Prediabetes  HYPERTRIGLYCERIDEMIA  OSA (obstructive sleep apnea)  Non-restorative sleep  Vitamin D deficiency  Generalized anxiety disorder  Cellulitis and abscess of neck  Orders: No orders of the defined types were placed in this encounter.  No orders of the defined types were placed in this encounter.    Follow-Up Instructions: Return in about 6 months (around 11/03/2020) for Osteoarthritis, psoriasis.   Bo Merino, MD  Note - This record has been created using Editor, commissioning.  Chart creation errors have been sought, but may not always  have been located. Such creation errors do not reflect on  the standard of medical care.

## 2020-05-06 ENCOUNTER — Ambulatory Visit: Payer: Medicare Other | Admitting: Rheumatology

## 2020-05-06 ENCOUNTER — Encounter: Payer: Self-pay | Admitting: Rheumatology

## 2020-05-06 ENCOUNTER — Other Ambulatory Visit: Payer: Self-pay

## 2020-05-06 VITALS — BP 138/77 | HR 85 | Resp 17 | Ht 68.0 in | Wt 243.0 lb

## 2020-05-06 DIAGNOSIS — R7303 Prediabetes: Secondary | ICD-10-CM | POA: Diagnosis not present

## 2020-05-06 DIAGNOSIS — M533 Sacrococcygeal disorders, not elsewhere classified: Secondary | ICD-10-CM | POA: Diagnosis not present

## 2020-05-06 DIAGNOSIS — M19071 Primary osteoarthritis, right ankle and foot: Secondary | ICD-10-CM

## 2020-05-06 DIAGNOSIS — Z96651 Presence of right artificial knee joint: Secondary | ICD-10-CM

## 2020-05-06 DIAGNOSIS — E781 Pure hyperglyceridemia: Secondary | ICD-10-CM | POA: Diagnosis not present

## 2020-05-06 DIAGNOSIS — Z8261 Family history of arthritis: Secondary | ICD-10-CM

## 2020-05-06 DIAGNOSIS — R21 Rash and other nonspecific skin eruption: Secondary | ICD-10-CM | POA: Diagnosis not present

## 2020-05-06 DIAGNOSIS — M19042 Primary osteoarthritis, left hand: Secondary | ICD-10-CM

## 2020-05-06 DIAGNOSIS — G4733 Obstructive sleep apnea (adult) (pediatric): Secondary | ICD-10-CM | POA: Diagnosis not present

## 2020-05-06 DIAGNOSIS — G478 Other sleep disorders: Secondary | ICD-10-CM

## 2020-05-06 DIAGNOSIS — M797 Fibromyalgia: Secondary | ICD-10-CM | POA: Diagnosis not present

## 2020-05-06 DIAGNOSIS — M19072 Primary osteoarthritis, left ankle and foot: Secondary | ICD-10-CM

## 2020-05-06 DIAGNOSIS — L608 Other nail disorders: Secondary | ICD-10-CM | POA: Diagnosis not present

## 2020-05-06 DIAGNOSIS — M19041 Primary osteoarthritis, right hand: Secondary | ICD-10-CM

## 2020-05-06 DIAGNOSIS — Z96652 Presence of left artificial knee joint: Secondary | ICD-10-CM | POA: Diagnosis not present

## 2020-05-06 DIAGNOSIS — L03221 Cellulitis of neck: Secondary | ICD-10-CM

## 2020-05-06 DIAGNOSIS — E559 Vitamin D deficiency, unspecified: Secondary | ICD-10-CM

## 2020-05-06 DIAGNOSIS — F411 Generalized anxiety disorder: Secondary | ICD-10-CM

## 2020-05-06 DIAGNOSIS — L0211 Cutaneous abscess of neck: Secondary | ICD-10-CM

## 2020-05-06 DIAGNOSIS — G8929 Other chronic pain: Secondary | ICD-10-CM

## 2020-05-06 DIAGNOSIS — Z840 Family history of diseases of the skin and subcutaneous tissue: Secondary | ICD-10-CM

## 2020-05-25 ENCOUNTER — Other Ambulatory Visit: Payer: Self-pay | Admitting: Family Medicine

## 2020-05-27 NOTE — Telephone Encounter (Signed)
Last office visit 01/23/2020 for Pain Management.  Last refilled 04/29/2020 for #180 with no refills.  No future appointments with PCP.

## 2020-05-31 ENCOUNTER — Telehealth: Payer: Self-pay | Admitting: Pulmonary Disease

## 2020-05-31 NOTE — Telephone Encounter (Signed)
Okay to change to phone or video visit.

## 2020-05-31 NOTE — Telephone Encounter (Signed)
I have changed the visit to a televisit for 04/07.  I have called the pt and LM on VM to make her aware.

## 2020-05-31 NOTE — Telephone Encounter (Signed)
Spoke to patient, who is requesting for 07/04/2020 visit to be phone. She stated that she is not having any current sx and she feels that a phone visit would work best for her.   Dr. Halford Chessman, please advise. Thanks

## 2020-06-05 DIAGNOSIS — L400 Psoriasis vulgaris: Secondary | ICD-10-CM | POA: Diagnosis not present

## 2020-06-22 ENCOUNTER — Other Ambulatory Visit: Payer: Self-pay | Admitting: Family Medicine

## 2020-06-24 NOTE — Telephone Encounter (Signed)
Last office visit 01/23/2020 for Pain Management.  Last refilled 05/27/2020 for #180 with no refills.  No future appointments with PCP.

## 2020-07-04 ENCOUNTER — Other Ambulatory Visit: Payer: Self-pay

## 2020-07-04 ENCOUNTER — Ambulatory Visit (INDEPENDENT_AMBULATORY_CARE_PROVIDER_SITE_OTHER): Payer: Medicare Other | Admitting: Pulmonary Disease

## 2020-07-04 ENCOUNTER — Encounter: Payer: Self-pay | Admitting: Pulmonary Disease

## 2020-07-04 DIAGNOSIS — G4733 Obstructive sleep apnea (adult) (pediatric): Secondary | ICD-10-CM | POA: Diagnosis not present

## 2020-07-04 NOTE — Progress Notes (Signed)
[facility]  Chief Complaint  Patient presents with  . Follow-up    Wearing cpap avg 6hr nightly- pressure and mask is okay.     Constitutional:  There were no vitals taken for this visit.  Past Medical History:  Fibromyalgia, OA  Past Surgical History:  She  has a past surgical history that includes Hernia repair; Cesarean section; Partial knee arthroplasty (Right, 02/07/2019); Partial knee arthroplasty (Left, 03/21/2019); and Eye surgery (Bilateral, 04/24/2020).  Brief Summary:  Jillian [name] is a 65 y.o. female former smoker with obstructive sleep apnea.       Subjective:  Virtual Visit via Telephone Note  I connected with Jillian [name] on 07/04/20 at 12:00 PM EDT by telephone and verified that I am speaking with the correct person using two identifiers.  Location: Patient: home Provider: medical office   I discussed the limitations, risks, security and privacy concerns of performing an evaluation and management service by telephone and the availability of in person appointments. I also discussed with the patient that there may be a patient responsible charge related to this service. The patient expressed understanding and agreed to proceed.  She was previous seen by Dr. Alva Garnet and Wyn Quaker.  She had sleep study in 2018 that showed moderate sleep apnea.  Uses CPAP on regular basis.  Has full face mask.  No issues with mask fit.  Not having sinus congestion, sore throat, or dry mouth.  Feels rested.    Physical Exam:  Deferred.   Sleep Tests:   PSG 05/21/16 >> AHI 22  Auto CPAP 06/03/20 to 07/02/20 >> used on 27 of 30 nights with average 5 hrs 18 min.  Average AHI 2.7 with median CPAP 11 and 95 th percentile CPAP 14 cm H2O.  Social History:  She  reports that she quit smoking about 28 years ago. Her smoking use included cigarettes. She has a 15.00 pack-year smoking history. She has never used smokeless tobacco. She  reports that she does not drink alcohol and does not use drugs.  Family History:  Her family history includes Anuerysm in her paternal grandfather; Arthritis in her brother; Cancer in her father, maternal grandmother, and paternal aunt; Diabetes in her brother and sister; Healthy in her daughter and son; Hyperlipidemia in her father; Hypertension in her father and maternal grandmother; Osteoarthritis in her brother; Psoriasis in her brother and sister; Throat cancer in her brother.     Assessment/Plan:   Obstructive sleep apnea. - she is compliant with CPAP and reports benefit from therapy - she uses [employer] for her DME - continue auto CPAP 8 to 15 cm H2O   Time Spent Involved in Patient Care on Day of Examination:  I discussed the assessment and treatment plan with the patient. The patient was provided an opportunity to ask questions and all were answered. The patient agreed with the plan and demonstrated an understanding of the instructions.   The patient was advised to call back or seek an in-person evaluation if the symptoms worsen or if the condition fails to improve as anticipated.  I provided 13 minutes of non-face-to-face time during this encounter.  Follow up:  Patient Instructions  Follow up in 1 year   Medication List:   Allergies as of 07/04/2020   No Known Allergies     Medication List       Accurate as of July 04, 2020 12:13 PM. If you have any questions, ask your nurse  or doctor.        buPROPion 150 MG 24 hr tablet Commonly known as: WELLBUTRIN XL TAKE 1 TABLET(150 MG) BY MOUTH DAILY   cholecalciferol 10 MCG (400 UNIT) Tabs tablet Commonly known as: VITAMIN D3 Take 400 Units by mouth daily.   Co Q 10 100 MG Caps Take 100 mg by mouth daily.   DULoxetine 30 MG capsule Commonly known as: CYMBALTA TAKE 3 CAPSULES BY MOUTH  DAILY   fish oil-omega-3 fatty acids 1000 MG capsule Take 4,000 mg by mouth daily.   Magnesium Malate 1250 (141.7 Mg) MG  Tabs Take 141.7-283.4 mg by mouth See admin instructions. Take 141.7mg  by mouth every morning and take 283.4mg  every evening   methocarbamol 500 MG tablet Commonly known as: ROBAXIN TAKE 1 TABLET BY MOUTH 3  TIMES DAILY AS NEEDED FOR  MUSCLE SPASMS   pregabalin 150 MG capsule Commonly known as: LYRICA TAKE 1 CAPSULE(150 MG) BY MOUTH THREE TIMES DAILY   traMADol 50 MG tablet Commonly known as: ULTRAM TAKE 2 TABLETS BY MOUTH 3  TIMES DAILY       Signature:  Chesley Mires, MD [place] Pager - [phone] 07/04/2020, 12:13 PM

## 2020-07-04 NOTE — Patient Instructions (Signed)
Follow up in 1 year.

## 2020-07-12 ENCOUNTER — Encounter: Payer: Self-pay | Admitting: Rheumatology

## 2020-07-15 NOTE — Telephone Encounter (Signed)
We should be able to give her a diagnosis of psoriatic arthritis and apply for [facility].  Patient will have to come in the office for the documentation and to apply for [facility].

## 2020-07-16 ENCOUNTER — Other Ambulatory Visit: Payer: Self-pay

## 2020-07-16 ENCOUNTER — Encounter: Payer: Self-pay | Admitting: Physician Assistant

## 2020-07-16 ENCOUNTER — Ambulatory Visit: Payer: Medicare Other | Admitting: Physician Assistant

## 2020-07-16 ENCOUNTER — Telehealth: Payer: Self-pay | Admitting: Pharmacist

## 2020-07-16 VITALS — BP 133/96 | HR 82 | Ht 68.0 in | Wt 240.0 lb

## 2020-07-16 DIAGNOSIS — G478 Other sleep disorders: Secondary | ICD-10-CM

## 2020-07-16 DIAGNOSIS — M19041 Primary osteoarthritis, right hand: Secondary | ICD-10-CM

## 2020-07-16 DIAGNOSIS — L409 Psoriasis, unspecified: Secondary | ICD-10-CM

## 2020-07-16 DIAGNOSIS — M19042 Primary osteoarthritis, left hand: Secondary | ICD-10-CM

## 2020-07-16 DIAGNOSIS — Z96651 Presence of right artificial knee joint: Secondary | ICD-10-CM

## 2020-07-16 DIAGNOSIS — M797 Fibromyalgia: Secondary | ICD-10-CM

## 2020-07-16 DIAGNOSIS — M19071 Primary osteoarthritis, right ankle and foot: Secondary | ICD-10-CM

## 2020-07-16 DIAGNOSIS — L608 Other nail disorders: Secondary | ICD-10-CM

## 2020-07-16 DIAGNOSIS — Z8261 Family history of arthritis: Secondary | ICD-10-CM

## 2020-07-16 DIAGNOSIS — Z79899 Other long term (current) drug therapy: Secondary | ICD-10-CM

## 2020-07-16 DIAGNOSIS — M19072 Primary osteoarthritis, left ankle and foot: Secondary | ICD-10-CM

## 2020-07-16 DIAGNOSIS — R7303 Prediabetes: Secondary | ICD-10-CM | POA: Diagnosis not present

## 2020-07-16 DIAGNOSIS — E559 Vitamin D deficiency, unspecified: Secondary | ICD-10-CM

## 2020-07-16 DIAGNOSIS — L4050 Arthropathic psoriasis, unspecified: Secondary | ICD-10-CM

## 2020-07-16 DIAGNOSIS — G8929 Other chronic pain: Secondary | ICD-10-CM

## 2020-07-16 DIAGNOSIS — M533 Sacrococcygeal disorders, not elsewhere classified: Secondary | ICD-10-CM | POA: Diagnosis not present

## 2020-07-16 DIAGNOSIS — Z111 Encounter for screening for respiratory tuberculosis: Secondary | ICD-10-CM

## 2020-07-16 DIAGNOSIS — F411 Generalized anxiety disorder: Secondary | ICD-10-CM

## 2020-07-16 DIAGNOSIS — Z96652 Presence of left artificial knee joint: Secondary | ICD-10-CM

## 2020-07-16 DIAGNOSIS — G4733 Obstructive sleep apnea (adult) (pediatric): Secondary | ICD-10-CM

## 2020-07-16 DIAGNOSIS — Z840 Family history of diseases of the skin and subcutaneous tissue: Secondary | ICD-10-CM

## 2020-07-16 NOTE — Telephone Encounter (Signed)
Please start [place].  Dose: Titration - Day 1: 10 mg in the morning, Day 2: 10 mg twice daily, Day 3: 10 mg in the morning and 20 mg in the evening; Day 4: 20 mg twice daily; Day 5: 20 mg in the morning and 30 mg in the evening.  Maintenance dose - 30 mg twice daily starting on day 6.  Dx: L40.50 (PsA), L40.9 (psoriasis)  Previously tried therapies: naive to tx (new PsA diagnosis)  Pending CBC/CMP today - no clear contraindication to MTX but we need updated LFTs to see if this is why MTX was avoided.  Knox Saliva, PharmD, MPH Clinical Pharmacist (Rheumatology and Pulmonology)

## 2020-07-16 NOTE — Telephone Encounter (Signed)
Started a Prior Authorization request to [facility] for OTEZLA via CoverMyMeds. Will complete and submit once pending lab results are finalized.   Key: [id number]

## 2020-07-16 NOTE — Progress Notes (Signed)
Office Visit Note  Patient: Jillian [name]             Date of Birth: 1956-03-16           MRN: [id number]             PCP: Jinny Sanders, MD Referring: Jinny Sanders, MD Visit Date: 07/16/2020 Occupation: '@GUAROCC' @  Subjective:  Discuss starting on otezla  History of Present Illness: Jillian [name] is a 65 y.o. female with history of psoriatic arthritis.  Since her last office visit on 05/06/2020 she was diagnosed with psoriasis by Dr. Merryl Hacker.  She has been using topical flucinonide external solution as needed.  She has psoriasis along her hairline, palmar aspect of both hands, gluteal region, and inguinal region.  She has started to notice nail pitting. She has a strong family history of psoriatic arthritis and psoriasis. She continues to have intermittent pain in multiple joints including both hands, both shoulders, and plantar fasciitis bilaterally.  She has discomfort due to trochanteric bursitis bilaterally.  She has generalized myalgias and muscle tenderness due to underlying fibromyalgia.  She presents today to discuss starting on otezla, which was previously discussed with her dermatologist and Dr. Merryl Hacker.     Activities of Daily Living:  Patient reports morning stiffness for  1-3 hours.   Patient Reports nocturnal pain.  Difficulty dressing/grooming: Denies Difficulty climbing stairs: Denies Difficulty getting out of chair: Denies Difficulty using hands for taps, buttons, cutlery, and/or writing: Denies  Review of Systems  Constitutional: Positive for fatigue.  HENT: Positive for mouth dryness. Negative for mouth sores and nose dryness.   Eyes: Positive for dryness. Negative for pain and visual disturbance.  Respiratory: Negative for cough, hemoptysis, shortness of breath and difficulty breathing.   Cardiovascular: Negative for chest pain, palpitations, hypertension and swelling in legs/feet.  Gastrointestinal: Positive for constipation. Negative for blood in stool and  diarrhea.  Endocrine: Negative for increased urination.  Genitourinary: Negative for painful urination.  Musculoskeletal: Positive for arthralgias, joint pain, myalgias, morning stiffness, muscle tenderness and myalgias. Negative for joint swelling and muscle weakness.  Skin: Positive for rash. Negative for color change, pallor, hair loss, nodules/bumps, skin tightness, ulcers and sensitivity to sunlight.  Allergic/Immunologic: Negative for susceptible to infections.  Neurological: Negative for dizziness, numbness, headaches and weakness.  Hematological: Negative for swollen glands.  Psychiatric/Behavioral: Negative for depressed mood and sleep disturbance. The patient is nervous/anxious.     PMFS History:  Patient Active Problem List   Diagnosis Date Noted  . Pitting of nails 01/23/2020  . OSA (obstructive sleep apnea) 04/12/2019  . S/P left unicompartmental knee replacement 03/21/2019  . Osteoarthritis of left knee 03/09/2019  . Primary localized osteoarthritis of right knee 02/07/2019  . Status post right partial knee replacement 02/07/2019  . Cellulitis and abscess of neck 12/23/2018  . Weight gain 07/14/2018  . Class 1 obesity due to excess calories without serious comorbidity with body mass index (BMI) of 33.0 to 33.9 in adult 05/13/2018  . Prediabetes 12/03/2017  . Non-restorative sleep 02/27/2016  . Vitamin D deficiency 10/28/2015  . Generalized anxiety disorder 11/25/2012  . HYPERTRIGLYCERIDEMIA 12/20/2008  . MENOPAUSE, EARLY 10/04/2008  . Fibromyalgia 10/04/2008    Past Medical History:  Diagnosis Date  . Cancer ([place])    basal cell 2016  . Dermatitis    per patient   . Fibromyalgia   . Pre-diabetes   . Primary localized osteoarthritis of right knee 02/07/2019  . Sleep apnea  uses CPAP    Family History  Problem Relation Age of Onset  . Cancer Father        colon  . Hyperlipidemia Father   . Hypertension Father   . Diabetes Sister   . Cancer Paternal Aunt         colon  . Cancer Maternal Grandmother        colon  . Hypertension Maternal Grandmother   . Anuerysm Paternal Grandfather   . Psoriasis Brother   . Arthritis Brother   . Psoriasis Sister   . Osteoarthritis Brother   . Diabetes Brother   . Throat cancer Brother   . Healthy Son   . Healthy Daughter    Past Surgical History:  Procedure Laterality Date  . CESAREAN SECTION     times 2  . EYE SURGERY Bilateral 04/24/2020   bilateral eyelids   . HERNIA REPAIR    . PARTIAL KNEE ARTHROPLASTY Right 02/07/2019   Procedure: UNICOMPARTMENTAL KNEE;  Surgeon: Marchia Bond, MD;  Location: [facility];  Service: Orthopedics;  Laterality: Right;  . PARTIAL KNEE ARTHROPLASTY Left 03/21/2019   Procedure: UNICOMPARTMENTAL KNEE;  Surgeon: Marchia Bond, MD;  Location: [facility];  Service: Orthopedics;  Laterality: Left;   Social History   Social History Narrative   Regular exercise-yes, but seasonal walks and swims   Diet: healthy, low carbohydrates, no caffeine   Immunization History  Administered Date(s) Administered  . Influenza-Unspecified 01/01/2020  . Moderna Sars-Covid-2 Vaccination 05/23/2019, 07/04/2019  . Td 03/30/2004  . Tdap 10/28/2015  . Zoster 08/30/2014     Objective: Vital Signs: BP (!) 133/96 (BP Location: Left Arm, Patient Position: Sitting, Cuff Size: Normal)   Pulse 82   Ht '5\' 8"'  (1.727 m)   Wt 240 lb (108.9 kg)   BMI 36.49 kg/m    Physical Exam Vitals and nursing note reviewed.  Constitutional:      Appearance: She is well-developed.  HENT:     Head: Normocephalic and atraumatic.  Eyes:     Conjunctiva/sclera: Conjunctivae normal.  Pulmonary:     Effort: Pulmonary effort is normal.  Abdominal:     Palpations: Abdomen is soft.  Musculoskeletal:     Cervical back: Normal range of motion.  Lymphadenopathy:     Cervical: No cervical adenopathy.  Skin:    General: Skin is warm and dry.     Capillary Refill: Capillary refill takes less than 2 seconds.   Neurological:     Mental Status: She is alert and oriented to person, place, and time.  Psychiatric:        Behavior: Behavior normal.      Musculoskeletal Exam: C-spine, thoracic spine, and lumbar spine good ROM.  Tenderness over the left SI joint.  Shoulder joints have good range of motion with tenderness bilaterally.  Trapezius muscle tension and muscle tenderness noted bilaterally.  Elbow joints, wrist joints, MCPs, PIPs, DIPs have good range of motion with no synovitis.  She has PIP and DIP thickening consistent with osteoarthritis of both hands.  She was able to make a complete fist bilaterally.  Hip joints have good range of motion with no discomfort.  She has tenderness palpation over bilateral trochanteric bursa.  Bilateral knee replacements have good range of motion with no warmth or effusion.  Ankle joints have good range of motion with no tenderness or inflammation.  She has plantar fasciitis bilaterally.  No tenderness along the Achilles tendons.  CDAI Exam: CDAI Score: -- Patient Global: --; Provider Global: --  Swollen: --; Tender: -- Joint Exam 07/16/2020   No joint exam has been documented for this visit   There is currently no information documented on the homunculus. Go to the Rheumatology activity and complete the homunculus joint exam.  Investigation: No additional findings.  Imaging: No results found.  Recent Labs: Lab Results  Component Value Date   WBC 14.0 (H) 04/19/2019   HGB 11.8 (L) 04/19/2019   PLT 219 04/19/2019   NA 137 03/15/2019   K 5.2 (H) 03/15/2019   CL 105 03/15/2019   CO2 27 03/15/2019   GLUCOSE 109 (H) 03/15/2019   BUN 15 03/15/2019   CREATININE 0.88 03/15/2019   BILITOT 0.3 03/03/2019   ALKPHOS 112 03/03/2019   AST 17 03/03/2019   ALT 20 03/03/2019   PROT 6.3 03/03/2019   ALBUMIN 3.9 03/03/2019   CALCIUM 9.5 03/15/2019   GFRAA >60 03/15/2019    Speciality Comments: No specialty comments available.  Procedures:  No procedures  performed Allergies: Patient has no known allergies.   Assessment / Plan:     Visit Diagnoses: Psoriatic arthritis ([place]) - Active psoriasis, plantar fasciitis, fingernail pitting, RF-, HLA-B27-, chronic SI joint pain, family history of psoriasis and psoriatic arthritis-Diagnosis of psoriatic arthritis is positive by CASPAR criteria:  She has no synovitis or dactylitis on exam today.  She continues to have pain and stiffness in multiple joints including both shoulders, both hands, and plantar fasciitis in both feet.  She has active psoriasis along her hairline, ear canals, palmar aspect of both hands, and groin.  Diagnosis of psoriasis was made by Dr. Venetia Maxon.  She has been using fluocinonide 0.05% external solution as needed.  She has ongoing fingernail pitting.  Dr. Estanislado Pandy and her dermatologist Dr. Venetia Maxon previously discussed starting her on [place].  She will be starting on the titration starter pack followed by the maintenance dose of 30 mg twice daily.  We will apply for Rutherford Nail through her insurance.  Indications, contraindications, potential side effects of Rutherford Nail were discussed today in detail.  All questions were addressed and consent was obtained. She was advised to notify us if she cannot tolerate taking Otezla.  She will follow up in 2 months to assess her response.  Counseled patient that Rutherford Nail is a PDE 4 inhibitor that works to treat psoriasis and the joint pain and tenderness of psoriatic arthritis.  Counseled patient on purpose, proper use, and adverse effects of Otezla.  Reviewed the most common adverse effects of weight loss, depression, nausea/diarrhea/vomiting, headaches, and nasal congestion. Pt denies a personal history of depression.  Advised patient to notify office of any serious changes in mood and/or thoughts of suicide.  Provided patient with medication education material and answered all questions.  Patient consented to [place].    Patient dose will be Otezla starter titration  pack and then 30 mg twice daily.  Prescription pending insurance approval and once approved patient may pick up sample for starter pack from our office.  Psoriasis - Diagnosed by Dr. Venetia Maxon.  She has psoriasis along her hairline, ear canals, palmar aspect of both hands, and groin. She has been using flucinonide external solution as needed. She will be starting on otezla as discussed above.   High risk medication use - She will be starting on otezla titration pack followed by maintenance dose of 30 mg BID. CBC and CMP drawn today prior to starting on Otezla. - Plan: CBC with Differential/Platelet, COMPLETE METABOLIC PANEL WITH GFR  Primary osteoarthritis of both hands: She  has PIP and DIP thickening consistent with osteoarthritis.  Radiographic findings on 04/08/2020 were consistent with osteoarthritic changes.  Discussed the importance of joint protection and muscle strengthening.  Primary osteoarthritis of both feet: Radiographic findings from 04/08/2020 were consistent with osteoarthritic changes in both feet.  No erosive changes were noted.  She has ongoing discomfort due to plantar fasciitis in both feet but is not having any joint pain or joint inflammation at this time.  Status post right partial knee replacement - Dr. Silva Bandy on 02/07/2019.  Doing well.  Good range of motion with no discomfort.  No warmth or effusion was noted.  S/P left unicompartmental knee replacement - Dr. Silva Bandy on 03/21/2019.  Doing well.  She has good range of motion with no discomfort or inflammation.  Chronic SI joint pain: X-rays of the pelvis were unremarkable on 04/08/2020.  She continues to have chronic SI joint pain bilaterally, left greater than right.  Pitting of nails: Fingernail pitting noted.   Family history of psoriatic arthritis: Sister and brother   Fibromyalgia: She has generalized myalgias and muscle tenderness due to underlying fibromyalgia.  Discussed the importance of regular  exercise and good sleep hygiene.  Other medical conditions are listed as follows:   Prediabetes  OSA (obstructive sleep apnea)  Non-restorative sleep  Vitamin D deficiency  Generalized anxiety disorder  Orders: Orders Placed This Encounter  Procedures  . CBC with Differential/Platelet  . COMPLETE METABOLIC PANEL WITH GFR   No orders of the defined types were placed in this encounter.    Follow-Up Instructions: Return in about 2 months (around 09/15/2020) for Psoriatic arthritis.   Jillian Neas, PA-C  Note - This record has been created using Dragon software.  Chart creation errors have been sought, but may not always  have been located. Such creation errors do not reflect on  the standard of medical care.

## 2020-07-16 NOTE — Patient Instructions (Signed)
Apremilast oral tablets What is this medicine? APREMILAST (a PRE mil ast) is used to treat plaque psoriasis, psoriatic arthritis, and certain oral ulcers. This medicine may be used for other purposes; ask your health care provider or pharmacist if you have questions. COMMON BRAND NAME(S): Rutherford Nail What should I tell my health care provider before I take this medicine? They need to know if you have any of these conditions:  dehydration  kidney disease  mental illness  an unusual or allergic reaction to apremilast, other medicines, foods, dyes, or preservatives  pregnant or trying to get pregnant  breast-feeding How should I use this medicine? Take this medicine by mouth with a glass of water. Follow the directions on the prescription label. Do not cut, crush or chew this medicine. You can take it with or without food. If it upsets your stomach, take it with food. Take your medicine at regular intervals. Do not take it more often than directed. Do not stop taking except on your doctor's advice. Talk to your pediatrician regarding the use of this medicine in children. Special care may be needed. Overdosage: If you think you have taken too much of this medicine contact a poison control center or emergency room at once. NOTE: This medicine is only for you. Do not share this medicine with others. What if I miss a dose? If you miss a dose, take it as soon as you can. If it is almost time for your next dose, take only that dose. Do not take double or extra doses. What may interact with this medicine? This medicine may interact with the following medications:  certain medicines for seizures like carbamazepine, phenobarbital, phenytoin  rifampin This list may not describe all possible interactions. Give your health care provider a list of all the medicines, herbs, non-prescription drugs, or dietary supplements you use. Also tell them if you smoke, drink alcohol, or use illegal drugs. Some items  may interact with your medicine. What should I watch for while using this medicine? Tell your doctor or healthcare professional if your symptoms do not start to get better or if they get worse. Patients and their families should watch out for new or worsening depression or thoughts of suicide. Also watch out for sudden changes in feelings such as feeling anxious, agitated, panicky, irritable, hostile, aggressive, impulsive, severely restless, overly excited and hyperactive, or not being able to sleep. If this happens, call your health care professional. Check with your doctor or health care professional if you get an attack of severe diarrhea, nausea and vomiting, or if you sweat a lot. The loss of too much body fluid can make it dangerous for you to take this medicine. What side effects may I notice from receiving this medicine? Side effects that you should report to your doctor or health care professional as soon as possible:  depressed mood  weight loss Side effects that usually do not require medical attention (report to your doctor or health care professional if they continue or are bothersome):  diarrhea  headache  nausea, vomiting This list may not describe all possible side effects. Call your doctor for medical advice about side effects. You may report side effects to FDA at 1-800-FDA-1088. Where should I keep my medicine? Keep out of the reach of children. Store below 30 degrees C (86 degrees F). Throw away any unused medicine after the expiration date. NOTE: This sheet is a summary. It may not cover all possible information. If you have questions about this  medicine, talk to your doctor, pharmacist, or health care provider.  2021 Elsevier/Gold Standard (2017-10-18 12:47:14)

## 2020-07-17 LAB — COMPLETE METABOLIC PANEL WITH GFR
AG Ratio: 2.3 (calc) (ref 1.0–2.5)
ALT: 24 U/L (ref 6–29)
AST: 16 U/L (ref 10–35)
Albumin: 4.4 g/dL (ref 3.6–5.1)
Alkaline phosphatase (APISO): 94 U/L (ref 37–153)
BUN: 15 mg/dL (ref 7–25)
CO2: 28 mmol/L (ref 20–32)
Calcium: 9.6 mg/dL (ref 8.6–10.4)
Chloride: 104 mmol/L (ref 98–110)
Creat: 0.85 mg/dL (ref 0.50–0.99)
GFR, Est African American: 83 mL/min/{1.73_m2} (ref >=60)
GFR, Est Non African American: 72 mL/min/{1.73_m2} (ref >=60)
Globulin: 1.9 g/dL (calc) (ref 1.9–3.7)
Glucose, Bld: 86 mg/dL (ref 65–99)
Potassium: 4.6 mmol/L (ref 3.5–5.3)
Sodium: 137 mmol/L (ref 135–146)
Total Bilirubin: 0.4 mg/dL (ref 0.2–1.2)
Total Protein: 6.3 g/dL (ref 6.1–8.1)

## 2020-07-17 LAB — CBC WITH DIFFERENTIAL/PLATELET
Absolute Monocytes: 819 cells/uL (ref 200–950)
Basophils Absolute: 38 cells/uL (ref 0–200)
Basophils Relative: 0.6 %
Eosinophils Absolute: 58 cells/uL (ref 15–500)
Eosinophils Relative: 0.9 %
HCT: 40.4 % (ref 35.0–45.0)
Hemoglobin: 13.8 g/dL (ref 11.7–15.5)
Lymphs Abs: 1562 cells/uL (ref 850–3900)
MCH: 31.6 pg (ref 27.0–33.0)
MCHC: 34.2 g/dL (ref 32.0–36.0)
MCV: 92.4 fL (ref 80.0–100.0)
MPV: 11.5 fL (ref 7.5–12.5)
Monocytes Relative: 12.8 %
Neutro Abs: 3923 cells/uL (ref 1500–7800)
Neutrophils Relative %: 61.3 %
Platelets: 218 10*3/uL (ref 140–400)
RBC: 4.37 10*6/uL (ref 3.80–5.10)
RDW: 12.1 % (ref 11.0–15.0)
Total Lymphocyte: 24.4 %
WBC: 6.4 10*3/uL (ref 3.8–10.8)

## 2020-07-17 NOTE — Telephone Encounter (Signed)
Submitted a Prior Authorization request to [facility] for OTEZLA via CoverMyMeds. Will update once we receive a response.   Key: [id number]

## 2020-07-17 NOTE — Progress Notes (Signed)
CBC and CMP WNL

## 2020-07-18 NOTE — Telephone Encounter (Signed)
Please call patient to discuss Tremfya.  If she is in agreement then we can apply for Tremfya.

## 2020-07-18 NOTE — Telephone Encounter (Signed)
Received a fax regarding Prior Authorization from [facility] for [place]. Authorization has been DENIED because patient has to try and fail or have contraindication/intolerance to TWO of the following mechanisms:  1) TNFi - Enbrel, Humira --> we can avoid these since she has history of skin cancer (2016)  No history of CHF  No family history of multiple sclerosis  2) IL-17 inhibitor - Cosentyx  No history of IBD  3) IL-23 inhibitor - Tremfya  4) Jak inhibitor - Morrie Sheldon  Former smoker (quit 1994) but no history of DVT/PE  Knox Saliva, PharmD, MPH Clinical Pharmacist (Rheumatology and Pulmonology)

## 2020-07-19 DIAGNOSIS — G4733 Obstructive sleep apnea (adult) (pediatric): Secondary | ICD-10-CM | POA: Diagnosis not present

## 2020-07-19 NOTE — Telephone Encounter (Signed)
Called and discussed Otezla denial. Discussed Tremfya and she states she is comfortable with self-administering injections. She states her sister was taking Tremfya and worked well for her.   Counseled patient that Delsa Grana is a IL-23 inhibitor.  Counseled patient on purpose, proper use, and adverse effects of Tremfya.  Reviewed the most common adverse effects including infection (specifically URTIs) and injection site reactions. Discussed that there is the possibility of an increased risk of malignancy.  Reviewed the signs and symptoms of RPLS with patient.  Counseled patient that Tremfya should be held for infection and prior to scheduled surgery.  Advised patient to get annual influenza vaccine and the pneumococcal vaccine as indicated.  Counseled patient to avoid live vaccines while on Tremfya.     Reviewed the importance of regular labs while on Tremfya therapy.  Discussed labs at 1 month and every 3 months routinely thereafter. Advised initial injection must be administered in office.  Patient verbalized understanding.  Will apply for Stelara through patient's insurance and update when we receive a response. Emailed patient assistance application to patient per her request  Prescription pending lab results and insurance approval.  Please start Tremfya BIV.  Dose: mg at Week 0, 4, and every 8 weeks thereafter

## 2020-07-22 ENCOUNTER — Other Ambulatory Visit: Payer: Self-pay | Admitting: Family Medicine

## 2020-07-22 NOTE — Telephone Encounter (Signed)
Last office visit 01/23/2020 for pain management.  Last refilled 06/25/2020 for #180 with no refills.  No future appointment with PCP.

## 2020-07-22 NOTE — Telephone Encounter (Signed)
Submitted a Prior Authorization request to [facility] for Tremfya via CoverMyMeds. Will update once we receive a response.   (Key: [id number]) - [id number]

## 2020-07-22 NOTE — Telephone Encounter (Signed)
Cindy, please schedule CPE with fasting labs prior or at least pain management OV  given I has not seen her in 6 months. Must schedule appointment before her tramadol can be refilled.  Please send back to me once appointment is made for me to refill to last until OV. Thanks!

## 2020-07-23 ENCOUNTER — Other Ambulatory Visit (HOSPITAL_COMMUNITY): Payer: Self-pay

## 2020-07-23 NOTE — Telephone Encounter (Signed)
Left message to call the office.

## 2020-07-23 NOTE — Telephone Encounter (Signed)
Received notification from [facility] regarding a prior authorization for [facility]. Authorization has been APPROVED from 07/22/20 to 03/29/21.   Authorization # [id number]  Unable to run test claim for loading doses, due to DUR rejection. Ran test claim for maintenance dose, patient's copay for 1 month is $2,919.91.  Patient will need to apply for JJPAF patient assistance. Patient was mailed PAP application

## 2020-07-24 NOTE — Telephone Encounter (Signed)
Pt has appt for CPE 08/16/2020... please advise on refill

## 2020-07-29 NOTE — Telephone Encounter (Signed)
JJPAF application for Tremfya including signed provider portion, insurance card copy, and med list.  Danley Danker in pending PAP folder next to [employer]. Sent email to patient regarding her portion. Advised her to notify me if she chooses to send to company directly herself so we can send provider portion  Knox Saliva, PharmD, MPH Clinical Pharmacist (Rheumatology and Pulmonology)

## 2020-08-01 ENCOUNTER — Other Ambulatory Visit: Payer: Self-pay | Admitting: Family Medicine

## 2020-08-07 ENCOUNTER — Telehealth: Payer: Self-pay | Admitting: Family Medicine

## 2020-08-07 DIAGNOSIS — E559 Vitamin D deficiency, unspecified: Secondary | ICD-10-CM

## 2020-08-07 DIAGNOSIS — R7303 Prediabetes: Secondary | ICD-10-CM

## 2020-08-07 DIAGNOSIS — E781 Pure hyperglyceridemia: Secondary | ICD-10-CM

## 2020-08-07 NOTE — Telephone Encounter (Signed)
-----   Message from Ellamae Sia sent at 07/29/2020 11:25 AM EDT ----- Regarding: Lab orders for Monday, 5.16.22 Patient is scheduled for CPX labs, please order future labs, Thanks , Karna Christmas

## 2020-08-09 ENCOUNTER — Other Ambulatory Visit: Payer: Self-pay | Admitting: Family Medicine

## 2020-08-09 NOTE — Telephone Encounter (Addendum)
Last office visit 01/23/2020 for pain management.  Last refilled Tramadol: 07/25/2020 for #180 with no refills.  Lyrica: 04/30/2020 for #90 with no refills.  MMW scheduled for 08/16/2020.

## 2020-08-12 ENCOUNTER — Other Ambulatory Visit (INDEPENDENT_AMBULATORY_CARE_PROVIDER_SITE_OTHER): Payer: Medicare Other

## 2020-08-12 ENCOUNTER — Other Ambulatory Visit: Payer: Self-pay

## 2020-08-12 DIAGNOSIS — E781 Pure hyperglyceridemia: Secondary | ICD-10-CM

## 2020-08-12 DIAGNOSIS — R7303 Prediabetes: Secondary | ICD-10-CM

## 2020-08-12 DIAGNOSIS — E559 Vitamin D deficiency, unspecified: Secondary | ICD-10-CM | POA: Diagnosis not present

## 2020-08-12 LAB — COMPREHENSIVE METABOLIC PANEL
ALT: 23 U/L (ref 0–35)
AST: 15 U/L (ref 0–37)
Albumin: 4.2 g/dL (ref 3.5–5.2)
Alkaline Phosphatase: 95 U/L (ref 39–117)
BUN: 17 mg/dL (ref 6–23)
CO2: 25 mEq/L (ref 19–32)
Calcium: 9.5 mg/dL (ref 8.4–10.5)
Chloride: 105 mEq/L (ref 96–112)
Creatinine, Ser: 0.83 mg/dL (ref 0.40–1.20)
GFR: 73.66 mL/min (ref >60.00)
Glucose, Bld: 136 mg/dL — ABNORMAL HIGH (ref 70–99)
Potassium: 4.2 mEq/L (ref 3.5–5.1)
Sodium: 138 mEq/L (ref 135–145)
Total Bilirubin: 0.4 mg/dL (ref 0.2–1.2)
Total Protein: 6.4 g/dL (ref 6.0–8.3)

## 2020-08-12 LAB — LIPID PANEL
Cholesterol: 163 mg/dL (ref 0–200)
HDL: 32 mg/dL — ABNORMAL LOW (ref >39.00)
NonHDL: 130.9
Total CHOL/HDL Ratio: 5
Triglycerides: 227 mg/dL — ABNORMAL HIGH (ref 0.0–149.0)
VLDL: 45.4 mg/dL — ABNORMAL HIGH (ref 0.0–40.0)

## 2020-08-12 LAB — HEMOGLOBIN A1C: Hgb A1c MFr Bld: 5.7 % (ref 4.6–6.5)

## 2020-08-12 LAB — LDL CHOLESTEROL, DIRECT: Direct LDL: 94 mg/dL

## 2020-08-12 LAB — VITAMIN D 25 HYDROXY (VIT D DEFICIENCY, FRACTURES): VITD: 15.63 ng/mL — ABNORMAL LOW (ref 30.00–100.00)

## 2020-08-12 NOTE — Progress Notes (Signed)
No critical labs need to be addressed urgently. We will discuss labs in detail at upcoming office visit.   

## 2020-08-12 NOTE — Telephone Encounter (Signed)
Called patient to follow up on JJPAF application that was email to her. LVM.

## 2020-08-15 NOTE — Telephone Encounter (Signed)
Received patient portion of Tremfya JJPAF application. Patient also dropped off income documents. Faxed completed patient portion, signed provider portion, med list, insurance card copy, and income docs to [place] [facility].  Fax: [phone] Phone: [phone]  Knox Saliva, PharmD, MPH Clinical Pharmacist (Rheumatology and Pulmonology)

## 2020-08-16 ENCOUNTER — Other Ambulatory Visit: Payer: Self-pay

## 2020-08-16 ENCOUNTER — Ambulatory Visit (INDEPENDENT_AMBULATORY_CARE_PROVIDER_SITE_OTHER): Payer: Medicare Other | Admitting: Family Medicine

## 2020-08-16 VITALS — BP 120/78 | HR 87 | Temp 98.3°F | Ht 67.8 in | Wt 237.0 lb

## 2020-08-16 DIAGNOSIS — Z0000 Encounter for general adult medical examination without abnormal findings: Secondary | ICD-10-CM | POA: Diagnosis not present

## 2020-08-16 DIAGNOSIS — E2839 Other primary ovarian failure: Secondary | ICD-10-CM

## 2020-08-16 DIAGNOSIS — Z23 Encounter for immunization: Secondary | ICD-10-CM | POA: Diagnosis not present

## 2020-08-16 DIAGNOSIS — Z6837 Body mass index (BMI) 37.0-37.9, adult: Secondary | ICD-10-CM

## 2020-08-16 DIAGNOSIS — E781 Pure hyperglyceridemia: Secondary | ICD-10-CM | POA: Diagnosis not present

## 2020-08-16 DIAGNOSIS — E559 Vitamin D deficiency, unspecified: Secondary | ICD-10-CM

## 2020-08-16 DIAGNOSIS — G4733 Obstructive sleep apnea (adult) (pediatric): Secondary | ICD-10-CM | POA: Diagnosis not present

## 2020-08-16 DIAGNOSIS — M797 Fibromyalgia: Secondary | ICD-10-CM | POA: Diagnosis not present

## 2020-08-16 MED ORDER — VITAMIN D (ERGOCALCIFEROL) 1.25 MG (50000 UNIT) PO CAPS: 50000.0000 [IU] | ORAL_CAPSULE | ORAL | 1 refills | Status: DC

## 2020-08-16 NOTE — Assessment & Plan Note (Signed)
Followed by pulmonary on CPAP 

## 2020-08-16 NOTE — Progress Notes (Signed)
Patient ID: Jillian [name], female    DOB: [birth year], 65 y.o.   MRN: [id number]  This visit was conducted in person.  BP 120/78   Pulse 87   Temp 98.3 F (36.8 C) (Temporal)   Ht 5' 7.8" (1.722 m)   Wt 237 lb (107.5 kg)   SpO2 96%   BMI 36.25 kg/m    CC:  Chief Complaint  Patient presents with  . Medicare Wellness    Part 1 and 2     Subjective:   HPI: Jillian [name] is a 65 y.o. female presenting on 08/16/2020 for Medicare Wellness (Part 1 and 2 )  The patient presents for annual medicare wellness, complete physical and review of chronic health problems. He/She also has the following acute concerns today: none.. interested in briatric surgery  I have personally reviewed the Medicare Annual Wellness questionnaire and have noted 1. The patient's medical and social history 2. Their use of alcohol, tobacco or illicit drugs 3. Their current medications and supplements 4. The patient's functional ability including ADL's, fall risks, home safety risks and hearing or visual             impairment. 5. Diet and physical activities 6. Evidence for depression or mood disorders 7.         Updated provider list The patients weight, height, BMI and visual acuity have been recorded in the chart I have made referrals, counseling and provided education to the patient based review of the above and I have provided the pt with a written personalized care plan for preventive services.  Documentation of this information was scanned into the electronic record under the media tab.    Advance directives and end of life planning reviewed in detail with patient and documented in EMR. Patient given handout on advance care directives if needed. HCPOA and living will updated if needed.   Hearing Screening   125Hz  250Hz  500Hz  1000Hz  2000Hz  3000Hz  4000Hz  6000Hz  8000Hz   Right ear:  20 20 20 20   40    Left ear:  20 40 40 0  0      Visual Acuity Screening   Right eye Left eye Both eyes  Without  correction:     With correction: 20/30 20/20 20/20     Fall Risk  08/16/2020  Falls in the past year? 1  Number falls in past yr: 0  Injury with Fall? [place] Visit from 08/16/2020 in [place] at [place]  PHQ-2 Total Score 0       Prediabetes  Lab Results  Component Value Date   HGBA1C 5.7 08/12/2020    GAD/MDD: moderate control on cymbalta and bupropion  Fibromyalgia,  much better controlle on cymbalta, wellbutrin, lyrica (three times a day),  Robaxin, tramadol (needed three times a day) and vitamin supplements.  Turmeric.   Now followed by  Rheumatology for psoriatic arthritis  reviewed last OV from 07/16/2020  Pending  Start of trmfia [place] not covered).. cbc and CMET followed  Primary osteoarthritis of both feet: Radiographic findings from 04/08/2020 were consistent with osteoarthritic changes in both feet.  No erosive changes were noted.  She has ongoing discomfort due to plantar fasciitis in both feet but is not having any joint pain or joint inflammation at this time.  Status post right partial knee replacement - Dr. Silva Bandy on 02/07/2019.  Doing well.  Good range of motion with no discomfort.  No warmth or effusion  was noted.  S/P left unicompartmental knee replacement - Dr. Silva Bandy on 03/21/2019.  Doing well.  She has good range of motion with no discomfort or inflammation.  Chronic SI joint pain: X-rays of the pelvis were unremarkable on 04/08/2020.  She continues to have chronic SI joint pain bilaterally, left greater than right.  Vit D.. very low.. not currently taking supplement.   OSA: Followed by pulmonary.Marland Kitchen on CPAP  High triglycerides ... nonfasting Lab Results  Component Value Date   CHOL 163 08/12/2020   HDL 32.00 (L) 08/12/2020   LDLCALC 73 03/03/2019   LDLDIRECT 94.0 08/12/2020   TRIG 227.0 (H) 08/12/2020   CHOLHDL 5 08/12/2020  The 10-year ASCVD risk score Mikey Bussing DC Jr., et al., 2013) is: 6.1%    Values used to calculate the score:     Age: 11 years     Sex: Female     Is Non-Hispanic African American: No     Diabetic: No     Tobacco smoker: No     Systolic Blood Pressure: 737 mmHg     Is BP treated: No     HDL Cholesterol: 32 mg/dL     Total Cholesterol: 163 mg/dL   She is interested in bariatric surgery.. comorbidities.. OA, OSA, prediabetes, high triglycerides  Plans at [place] [facility]  Body mass index is 36.25 kg/m. Wt Readings from Last 3 Encounters:  08/16/20 237 lb (107.5 kg)  07/16/20 240 lb (108.9 kg)  05/06/20 243 lb (110.2 kg)   BP Readings from Last 3 Encounters:  08/16/20 120/78  07/16/20 (!) 133/96  05/06/20 138/77         Relevant past medical, surgical, family and social history reviewed and updated as indicated. Interim medical history since our last visit reviewed. Allergies and medications reviewed and updated. Outpatient Medications Prior to Visit  Medication Sig Dispense Refill  . augmented betamethasone dipropionate (DIPROLENE-AF) 0.05 % ointment Apply topically as needed.    Marland Kitchen buPROPion (WELLBUTRIN XL) 150 MG 24 hr tablet TAKE 1 TABLET BY MOUTH  DAILY 90 tablet 0  . cholecalciferol (VITAMIN D) 400 units TABS tablet Take 400 Units by mouth daily.     . Coenzyme Q10 (CO Q 10) 100 MG CAPS Take 100 mg by mouth daily.     . DULoxetine (CYMBALTA) 30 MG capsule TAKE 3 CAPSULES BY MOUTH  DAILY 270 capsule 3  . fish oil-omega-3 fatty acids 1000 MG capsule Take 4,000 mg by mouth daily.    . fluocinonide (LIDEX) 0.05 % external solution as needed.    . Magnesium Malate 1250 (141.7 Mg) MG TABS Take 141.7-283.4 mg by mouth See admin instructions. Take 141.7mg  by mouth every morning and take 283.4mg  every evening    . methocarbamol (ROBAXIN) 500 MG tablet TAKE 1 TABLET BY MOUTH 3  TIMES DAILY AS NEEDED FOR  MUSCLE SPASMS 270 tablet 0  . pregabalin (LYRICA) 150 MG capsule TAKE 1 CAPSULE BY MOUTH  TWICE DAILY 180 capsule 0  . traMADol (ULTRAM) 50 MG  tablet TAKE 2 TABLETS BY MOUTH 3  TIMES DAILY 180 tablet 0   No facility-administered medications prior to visit.     Per HPI unless specifically indicated in ROS section below Review of Systems  Constitutional: Negative for fatigue and fever.  HENT: Negative for congestion.   Eyes: Negative for pain.  Respiratory: Negative for cough and shortness of breath.   Cardiovascular: Negative for chest pain, palpitations and leg swelling.  Gastrointestinal: Negative for abdominal pain.  Genitourinary: Negative for dysuria and vaginal bleeding.  Musculoskeletal: Negative for back pain.  Neurological: Negative for syncope, light-headedness and headaches.  Psychiatric/Behavioral: Negative for dysphoric mood.   Objective:  BP 120/78   Pulse 87   Temp 98.3 F (36.8 C) (Temporal)   Ht 5' 7.8" (1.722 m)   Wt 237 lb (107.5 kg)   SpO2 96%   BMI 36.25 kg/m   Wt Readings from Last 3 Encounters:  08/16/20 237 lb (107.5 kg)  07/16/20 240 lb (108.9 kg)  05/06/20 243 lb (110.2 kg)      Physical Exam Constitutional:      General: She is not in acute distress.    Appearance: Normal appearance. She is well-developed. She is obese. She is not ill-appearing or toxic-appearing.  HENT:     Head: Normocephalic.     Right Ear: Hearing, tympanic membrane, ear canal and external ear normal. Tympanic membrane is not erythematous, retracted or bulging.     Left Ear: Hearing, tympanic membrane, ear canal and external ear normal. Tympanic membrane is not erythematous, retracted or bulging.     Nose: No mucosal edema or rhinorrhea.     Right Sinus: No maxillary sinus tenderness or frontal sinus tenderness.     Left Sinus: No maxillary sinus tenderness or frontal sinus tenderness.     Mouth/Throat:     Pharynx: Uvula midline.  Eyes:     General: Lids are normal. Lids are everted, no foreign bodies appreciated.     Conjunctiva/sclera: Conjunctivae normal.     Pupils: Pupils are equal, round, and reactive to  light.  Neck:     Thyroid: No thyroid mass or thyromegaly.     Vascular: No carotid bruit.     Trachea: Trachea normal.  Cardiovascular:     Rate and Rhythm: Normal rate and regular rhythm.     Pulses: Normal pulses.     Heart sounds: Normal heart sounds, S1 normal and S2 normal. No murmur heard. No friction rub. No gallop.   Pulmonary:     Effort: Pulmonary effort is normal. No tachypnea or respiratory distress.     Breath sounds: Normal breath sounds. No decreased breath sounds, wheezing, rhonchi or rales.  Abdominal:     General: Bowel sounds are normal.     Palpations: Abdomen is soft.     Tenderness: There is no abdominal tenderness.  Musculoskeletal:     Cervical back: Normal range of motion and neck supple.  Skin:    General: Skin is warm and dry.     Findings: Rash present.  Neurological:     Mental Status: She is alert.  Psychiatric:        Mood and Affect: Mood is not anxious or depressed.        Speech: Speech normal.        Behavior: Behavior normal. Behavior is cooperative.        Thought Content: Thought content normal.        Judgment: Judgment normal.       Results for orders placed or performed in visit on 08/12/20  VITAMIN D 25 Hydroxy (Vit-D Deficiency, Fractures)  Result Value Ref Range   VITD 15.63 (L) 30.00 - 100.00 ng/mL  Comprehensive metabolic panel  Result Value Ref Range   Sodium 138 135 - 145 mEq/L   Potassium 4.2 3.5 - 5.1 mEq/L   Chloride 105 96 - 112 mEq/L   CO2 25 19 - 32 mEq/L   Glucose, Bld 136 (H) 70 -  99 mg/dL   BUN 17 6 - 23 mg/dL   Creatinine, Ser 0.83 0.40 - 1.20 mg/dL   Total Bilirubin 0.4 0.2 - 1.2 mg/dL   Alkaline Phosphatase 95 39 - 117 U/L   AST 15 0 - 37 U/L   ALT 23 0 - 35 U/L   Total Protein 6.4 6.0 - 8.3 g/dL   Albumin 4.2 3.5 - 5.2 g/dL   GFR 73.66 >60.00 mL/min   Calcium 9.5 8.4 - 10.5 mg/dL  Lipid panel  Result Value Ref Range   Cholesterol 163 0 - 200 mg/dL   Triglycerides 227.0 (H) 0.0 - 149.0 mg/dL   HDL  32.00 (L) >39.00 mg/dL   VLDL 45.4 (H) 0.0 - 40.0 mg/dL   Total CHOL/HDL Ratio 5    NonHDL 130.90   Hemoglobin A1c  Result Value Ref Range   Hgb A1c MFr Bld 5.7 4.6 - 6.5 %  LDL cholesterol, direct  Result Value Ref Range   Direct LDL 94.0 mg/dL    This visit occurred during the SARS-CoV-2 public health emergency.  Safety protocols were in place, including screening questions prior to the visit, additional usage of staff PPE, and extensive cleaning of exam room while observing appropriate contact time as indicated for disinfecting solutions.   COVID 19 screen:  No recent travel or known exposure to COVID19 The patient denies respiratory symptoms of COVID 19 at this time. The importance of social distancing was discussed today.   Assessment and Plan   The patient's preventative maintenance and recommended screening tests for an annual wellness exam were reviewed in full today. Brought up to date unless services declined.  Counselled on the importance of diet, exercise, and its role in overall health and mortality. The patient's FH and SH was reviewed, including their home life, tobacco status, and drug and alcohol status.   Vaccines: Uptodate Tdap,  given PNA 20 and rx for Shingrix  Mammo: plans every 2 year. Mammogram: 12/2018 DVE/pap:nml pap/dve nml, neg co-testing 2017, no further indicated Colon:Father with colon cancer age 37.. Colonoscopy 12/26/2010: no polyps 09/2016 Incomplete prep.Marland KitchenMarland KitchenLin GivensVira Agar.. repeat planned 5 years Nonsmoker  Bone density: went through early menopause but no other risk factors. Begin screening at age 34.  Nonsmoker Hep C: neg  Problem List Items Addressed This Visit    Class 2 severe obesity with serious comorbidity and body mass index (BMI) of 37.0 to 37.9 in adult [facility])     Co Morbidities include multiple sites of OA, S/P Bilateral knee replacement, prediabetes, obstructive sleep apnea and hypertriglyceridemia       Fibromyalgia    Improved control.  Continue current regimen.  no red flags.  PDMP reviewed during this encounter. Tramadol and lyrica.      HYPERTRIGLYCERIDEMIA    Encouraged exercise, weight loss, healthy eating habits.       OSA (obstructive sleep apnea)    Followed by pulmonary on CPAP      Vitamin D deficiency    Replete with 24 weeks of Rx supplementation.       Other Visit Diagnoses    Medicare annual wellness visit, initial    -  Primary   Estrogen deficiency       Relevant Orders   DG Bone Density   Need for vaccination against Streptococcus pneumoniae       Relevant Orders   Pneumococcal conjugate vaccine 20-valent (Prevnar 20)      Eliezer Lofts, MD

## 2020-08-16 NOTE — Assessment & Plan Note (Signed)
Replete with 24 weeks of Rx supplementation.

## 2020-08-16 NOTE — Patient Instructions (Addendum)
Get shingrix vaccine when ready.  Send in summary of weight loss attempt in past, as detailed as possible and I will complete the paperwork for bariatric surgery.  Please call the location of your choice from the menu below to schedule your Mammogram and/or Bone Density appointment.    [facility]   1. [facility]                      Phone:  [phone] [address], [place] [address]                                                             Services: Traditional and 3D Mammogram, Bone Density   2. [facility]                 Phone: [phone], [place] [address]    Service: Bone Density ONLY   *this site does NOT perform mammograms  3. [facility]                        Phone:  [phone] [address] [place], [place] [address]                                            Services:  3D Mammogram and Bone Density      1. [place] [facility]   Phone:  [phone]   [place], [place] [address]                                            Services: 3D Mammogram and Bone Density  2. [place] [facility])  Phone:  [phone]   [address]  [place], [place] [address]                                              Services:  3D Mammogram and Bone Density  

## 2020-08-16 NOTE — Assessment & Plan Note (Signed)
Co Morbidities include multiple sites of OA, S/P Bilateral knee replacement, prediabetes, obstructive sleep apnea and hypertriglyceridemia

## 2020-08-16 NOTE — Assessment & Plan Note (Addendum)
Improved control.  Continue current regimen.  no red flags.  PDMP reviewed during this encounter. Tramadol and lyrica.

## 2020-08-16 NOTE — Assessment & Plan Note (Signed)
Encouraged exercise, weight loss, healthy eating habits. ? ?

## 2020-08-19 NOTE — Telephone Encounter (Signed)
Received fax from [facility]  for [facility] patient assistance, patient's application has been DENIED due to having Medicare coverage - patient has been emailed appeal form to complete. Also sent MyChart to notify.    Phone# [phone]  Knox Saliva, PharmD, MPH Clinical Pharmacist (Rheumatology and Pulmonology)

## 2020-08-20 NOTE — Telephone Encounter (Signed)
Received e-mail from patient stating that she does not plan to appeal for Tremfya. Will f/u with patient in the morning. The appeal form is for Medicare patients to qualify for PAP. There may be some confusion from patient's perspective. Will attempt to speak with patient directly over the phone  Knox Saliva, PharmD, MPH Clinical Pharmacist (Rheumatology and Pulmonology)

## 2020-08-23 NOTE — Telephone Encounter (Signed)
ATC patient but unable to reach. Left VM regarding appeal for JJPAF that states that she has Medicare. F/u with Hazel Sams is scheduled on 09/10/20. Will f/u next week

## 2020-08-27 ENCOUNTER — Other Ambulatory Visit: Payer: Self-pay | Admitting: Surgery

## 2020-08-27 ENCOUNTER — Other Ambulatory Visit (HOSPITAL_COMMUNITY): Payer: Self-pay | Admitting: Surgery

## 2020-08-27 NOTE — Progress Notes (Deleted)
Office Visit Note  Patient: Jillian [name]             Date of Birth: Mar 26, 1956           MRN: [id number]             PCP: Jinny Sanders, MD Referring: Jinny Sanders, MD Visit Date: 09/10/2020 Occupation: @GUAROCC @  Subjective:  No chief complaint on file.   History of Present Illness: Jillian [name] is a 65 y.o. female ***   Activities of Daily Living:  Patient reports morning stiffness for *** {minute/hour:19697}.   Patient {ACTIONS;DENIES/REPORTS:21021675::"Denies"} nocturnal pain.  Difficulty dressing/grooming: {ACTIONS;DENIES/REPORTS:21021675::"Denies"} Difficulty climbing stairs: {ACTIONS;DENIES/REPORTS:21021675::"Denies"} Difficulty getting out of chair: {ACTIONS;DENIES/REPORTS:21021675::"Denies"} Difficulty using hands for taps, buttons, cutlery, and/or writing: {ACTIONS;DENIES/REPORTS:21021675::"Denies"}  No Rheumatology ROS completed.   PMFS History:  Patient Active Problem List   Diagnosis Date Noted  . Pitting of nails 01/23/2020  . OSA (obstructive sleep apnea) 04/12/2019  . S/P left unicompartmental knee replacement 03/21/2019  . Osteoarthritis of left knee 03/09/2019  . Primary localized osteoarthritis of right knee 02/07/2019  . Status post right partial knee replacement 02/07/2019  . Weight gain 07/14/2018  . Class 2 severe obesity with serious comorbidity and body mass index (BMI) of 37.0 to 37.9 in adult ([place]) 05/13/2018  . Prediabetes 12/03/2017  . Non-restorative sleep 02/27/2016  . Vitamin D deficiency 10/28/2015  . Generalized anxiety disorder 11/25/2012  . HYPERTRIGLYCERIDEMIA 12/20/2008  . MENOPAUSE, EARLY 10/04/2008  . Fibromyalgia 10/04/2008    Past Medical History:  Diagnosis Date  . Cancer ([place])    basal cell 2016  . Dermatitis    per patient   . Fibromyalgia   . Pre-diabetes   . Primary localized osteoarthritis of right knee 02/07/2019  . Sleep apnea    uses CPAP    Family History  Problem Relation Age of Onset  . Cancer  Father        colon  . Hyperlipidemia Father   . Hypertension Father   . Diabetes Sister   . Cancer Paternal Aunt        colon  . Cancer Maternal Grandmother        colon  . Hypertension Maternal Grandmother   . Anuerysm Paternal Grandfather   . Psoriasis Brother   . Arthritis Brother   . Psoriasis Sister   . Osteoarthritis Brother   . Diabetes Brother   . Throat cancer Brother   . Healthy Son   . Healthy Daughter    Past Surgical History:  Procedure Laterality Date  . CESAREAN SECTION     times 2  . EYE SURGERY Bilateral 04/24/2020   bilateral eyelids   . HERNIA REPAIR    . PARTIAL KNEE ARTHROPLASTY Right 02/07/2019   Procedure: UNICOMPARTMENTAL KNEE;  Surgeon: Marchia Bond, MD;  Location: [facility];  Service: Orthopedics;  Laterality: Right;  . PARTIAL KNEE ARTHROPLASTY Left 03/21/2019   Procedure: UNICOMPARTMENTAL KNEE;  Surgeon: Marchia Bond, MD;  Location: [facility];  Service: Orthopedics;  Laterality: Left;   Social History   Social History Narrative   Regular exercise-yes, but seasonal walks and swims   Diet: healthy, low carbohydrates, no caffeine   Immunization History  Administered Date(s) Administered  . Influenza-Unspecified 01/01/2020  . Moderna Sars-Covid-2 Vaccination 05/23/2019, 07/04/2019  . PNEUMOCOCCAL CONJUGATE-20 08/16/2020  . Td 03/30/2004  . Tdap 10/28/2015  . Zoster, Live 08/30/2014     Objective: Vital Signs: There were no vitals taken for this visit.   Physical  Exam   Musculoskeletal Exam: ***  CDAI Exam: CDAI Score: -- Patient Global: --; Provider Global: -- Swollen: --; Tender: -- Joint Exam 09/10/2020   No joint exam has been documented for this visit   There is currently no information documented on the homunculus. Go to the Rheumatology activity and complete the homunculus joint exam.  Investigation: No additional findings.  Imaging: No results found.  Recent Labs: Lab Results  Component Value Date   WBC 6.4  07/16/2020   HGB 13.8 07/16/2020   PLT 218 07/16/2020   NA 138 08/12/2020   K 4.2 08/12/2020   CL 105 08/12/2020   CO2 25 08/12/2020   GLUCOSE 136 (H) 08/12/2020   BUN 17 08/12/2020   CREATININE 0.83 08/12/2020   BILITOT 0.4 08/12/2020   ALKPHOS 95 08/12/2020   AST 15 08/12/2020   ALT 23 08/12/2020   PROT 6.4 08/12/2020   ALBUMIN 4.2 08/12/2020   CALCIUM 9.5 08/12/2020   GFRAA 83 07/16/2020    Speciality Comments: No specialty comments available.  Procedures:  No procedures performed Allergies: Patient has no known allergies.   Assessment / Plan:     Visit Diagnoses: No diagnosis found.  Orders: No orders of the defined types were placed in this encounter.  No orders of the defined types were placed in this encounter.   Face-to-face time spent with patient was *** minutes. Greater than 50% of time was spent in counseling and coordination of care.  Follow-Up Instructions: No follow-ups on file.   Earnestine Mealing, CMA  Note - This record has been created using Editor, commissioning.  Chart creation errors have been sought, but may not always  have been located. Such creation errors do not reflect on  the standard of medical care.

## 2020-08-30 NOTE — Telephone Encounter (Signed)
Spoke with patient regarding Tremfya. She will plan to submit completed appeal form for Tremfya to [employer] today.  I've cancelled f/u appt with Hazel Sams since there are no changes and patient is agreeable. We will r/s this appointment during her Tremfya new start visit.  Knox Saliva, PharmD, MPH Clinical Pharmacist (Rheumatology and Pulmonology)

## 2020-09-03 NOTE — Telephone Encounter (Signed)
Per [employer], they received Tremfya appeal letter from patient yesterday. Expected turnaround time is 48-72 hours. Will f/u later this week  Knox Saliva, PharmD, MPH Clinical Pharmacist (Rheumatology and Pulmonology)

## 2020-09-05 ENCOUNTER — Other Ambulatory Visit (HOSPITAL_COMMUNITY): Payer: Self-pay

## 2020-09-05 MED ORDER — TREMFYA 100 MG/ML ~~LOC~~ SOAJ
SUBCUTANEOUS | 0 refills | Status: DC
  Filled 2020-09-05: qty 2, 30d supply, fill #0

## 2020-09-05 NOTE — Addendum Note (Signed)
Addended by: Cassandria Anger on: 09/05/2020 09:08 AM   Modules accepted: Orders

## 2020-09-05 NOTE — Telephone Encounter (Addendum)
Patient returned call. Discussed Tremfya approval through [employer] until 03/29/21.  Chest x-ray is scheduled for 09/09/20 @10am . Shipment of medication to clinic in process. Will need this completed as well as baseline immunosuppressive labs prior to starting Tremfya.  She would like labs to be faxed to [facility] on [address] in [place]. She plans to stop by [facility] next Monday, 09/09/20 to have these labs completed.  Future lab orders placed as below: Lab Orders  QuantiFERON-TB Gold Plus  IgG, IgA, IgM  Hepatitis B surface antigen  Hepatitis B core antibody, IgM  Hepatitis C antibody  CBC with Differential/Platelet   [facility] phone: [phone] [facility] fax: [phone]  Will route to clinical team to fax lab orders to [facility] on Friday, 09/06/20  Knox Saliva, PharmD, MPH Clinical Pharmacist (Rheumatology and Pulmonology)

## 2020-09-05 NOTE — Addendum Note (Signed)
Addended by: Cassandria Anger on: 09/05/2020 09:50 AM   Modules accepted: Orders

## 2020-09-05 NOTE — Telephone Encounter (Addendum)
Received a fax from [employer]  regarding an approval for [facility] patient assistance from 09/04/20 to 03/29/21.   Phone number: [phone]  [employer] billing information: ID: [id number] [place]: [id number] Group: [id number]  Rx sent to [facility] to be couriered to clinic for new start appt - routing to [facility] to help coordinate  Patient needs updated: TB Gold, chest x-ray, and immunoglobulins prior to starting. Left VM for patient requesting call back to discuss.  CBC    Component Value Date/Time   WBC 6.4 07/16/2020 0855   RBC 4.37 07/16/2020 0855   HGB 13.8 07/16/2020 0855   HCT 40.4 07/16/2020 0855   PLT 218 07/16/2020 0855   MCV 92.4 07/16/2020 0855   MCH 31.6 07/16/2020 0855   MCHC 34.2 07/16/2020 0855   RDW 12.1 07/16/2020 0855   LYMPHSABS 1,562 07/16/2020 0855   MONOABS 1.1 (H) 04/19/2019 1250   EOSABS 58 07/16/2020 0855   BASOSABS 38 07/16/2020 0855     CMP     Component Value Date/Time   NA 138 08/12/2020 0811   K 4.2 08/12/2020 0811   CL 105 08/12/2020 0811   CO2 25 08/12/2020 0811   GLUCOSE 136 (H) 08/12/2020 0811   BUN 17 08/12/2020 0811   CREATININE 0.83 08/12/2020 0811   CREATININE 0.85 07/16/2020 0855   CALCIUM 9.5 08/12/2020 0811   PROT 6.4 08/12/2020 0811   ALBUMIN 4.2 08/12/2020 0811   AST 15 08/12/2020 0811   ALT 23 08/12/2020 0811   ALKPHOS 95 08/12/2020 0811   BILITOT 0.4 08/12/2020 0811   GFRNONAA 72 07/16/2020 0855   GFRAA 83 07/16/2020 0855    Hepatitis Panel Hepatitis Latest Ref Rng & Units 10/18/2015  Hep C Ab NEGATIVE NEGATIVE   HIV Lab Results  Component Value Date   HIV Non Reactive 12/13/2018   SPEP Serum Protein Electrophoresis Latest Ref Rng & Units 08/12/2020  Total Protein 6.0 - 8.3 g/dL 6.4   Knox Saliva, PharmD, MPH Clinical Pharmacist (Rheumatology and Pulmonology)

## 2020-09-05 NOTE — Telephone Encounter (Signed)
[facility] address added to pt's chart, prescription marked as "courier to clinic". PAP information added to profile and billed, pt has $0 copay

## 2020-09-06 ENCOUNTER — Other Ambulatory Visit: Payer: Self-pay | Admitting: *Deleted

## 2020-09-06 ENCOUNTER — Other Ambulatory Visit (HOSPITAL_COMMUNITY): Payer: Self-pay

## 2020-09-06 DIAGNOSIS — Z79899 Other long term (current) drug therapy: Secondary | ICD-10-CM

## 2020-09-06 DIAGNOSIS — Z111 Encounter for screening for respiratory tuberculosis: Secondary | ICD-10-CM

## 2020-09-06 NOTE — Telephone Encounter (Signed)
Lab Orders released.  

## 2020-09-09 ENCOUNTER — Other Ambulatory Visit: Payer: Self-pay

## 2020-09-09 ENCOUNTER — Ambulatory Visit (HOSPITAL_COMMUNITY)
Admission: RE | Admit: 2020-09-09 | Discharge: 2020-09-09 | Disposition: A | Discharge status: Home / Self Care | Payer: Medicare Other | Source: Ambulatory Visit | Attending: Surgery | Admitting: Surgery

## 2020-09-09 DIAGNOSIS — Z01818 Encounter for other preprocedural examination: Secondary | ICD-10-CM | POA: Diagnosis not present

## 2020-09-09 DIAGNOSIS — Z79899 Other long term (current) drug therapy: Secondary | ICD-10-CM | POA: Diagnosis not present

## 2020-09-09 NOTE — Telephone Encounter (Signed)
Received shipment of Tremfya #2 auto-injectors from [facility] today. New start visit to be scheduled pending chest x-ray (to be completed today) and baseline labs (to be completed today)  Placed medication in refrigerator for now.  Knox Saliva, PharmD, MPH Clinical Pharmacist (Rheumatology and Pulmonology)

## 2020-09-10 ENCOUNTER — Encounter: Payer: Self-pay | Admitting: Family Medicine

## 2020-09-10 ENCOUNTER — Ambulatory Visit: Payer: Medicare Other | Admitting: Physician Assistant

## 2020-09-10 DIAGNOSIS — Z79899 Other long term (current) drug therapy: Secondary | ICD-10-CM

## 2020-09-10 DIAGNOSIS — Z96651 Presence of right artificial knee joint: Secondary | ICD-10-CM

## 2020-09-10 DIAGNOSIS — R7303 Prediabetes: Secondary | ICD-10-CM

## 2020-09-10 DIAGNOSIS — L409 Psoriasis, unspecified: Secondary | ICD-10-CM

## 2020-09-10 DIAGNOSIS — F411 Generalized anxiety disorder: Secondary | ICD-10-CM

## 2020-09-10 DIAGNOSIS — L4050 Arthropathic psoriasis, unspecified: Secondary | ICD-10-CM

## 2020-09-10 DIAGNOSIS — G4733 Obstructive sleep apnea (adult) (pediatric): Secondary | ICD-10-CM

## 2020-09-10 DIAGNOSIS — G478 Other sleep disorders: Secondary | ICD-10-CM

## 2020-09-10 DIAGNOSIS — Z8261 Family history of arthritis: Secondary | ICD-10-CM

## 2020-09-10 DIAGNOSIS — M797 Fibromyalgia: Secondary | ICD-10-CM

## 2020-09-10 DIAGNOSIS — E559 Vitamin D deficiency, unspecified: Secondary | ICD-10-CM

## 2020-09-10 DIAGNOSIS — M19042 Primary osteoarthritis, left hand: Secondary | ICD-10-CM

## 2020-09-10 DIAGNOSIS — G8929 Other chronic pain: Secondary | ICD-10-CM

## 2020-09-10 DIAGNOSIS — M19072 Primary osteoarthritis, left ankle and foot: Secondary | ICD-10-CM

## 2020-09-10 DIAGNOSIS — L608 Other nail disorders: Secondary | ICD-10-CM

## 2020-09-10 DIAGNOSIS — Z96652 Presence of left artificial knee joint: Secondary | ICD-10-CM

## 2020-09-10 NOTE — Progress Notes (Signed)
CBC WNL.  Hepatitis B and C negative. IgA borderline low. IgG and IgM WNL.   TB gold pending.

## 2020-09-12 LAB — CBC WITH DIFFERENTIAL/PLATELET
Basophils Absolute: 0 10*3/uL (ref 0.0–0.2)
Basos: 1 %
EOS (ABSOLUTE): 0.1 10*3/uL (ref 0.0–0.4)
Eos: 2 %
Hematocrit: 38.1 % (ref 34.0–46.6)
Hemoglobin: 13.1 g/dL (ref 11.1–15.9)
Immature Grans (Abs): 0 10*3/uL (ref 0.0–0.1)
Immature Granulocytes: 0 %
Lymphocytes Absolute: 1.7 10*3/uL (ref 0.7–3.1)
Lymphs: 29 %
MCH: 31 pg (ref 26.6–33.0)
MCHC: 34.4 g/dL (ref 31.5–35.7)
MCV: 90 fL (ref 79–97)
Monocytes Absolute: 0.9 10*3/uL (ref 0.1–0.9)
Monocytes: 15 %
Neutrophils Absolute: 3 10*3/uL (ref 1.4–7.0)
Neutrophils: 53 %
Platelets: 226 10*3/uL (ref 150–450)
RBC: 4.22 x10E6/uL (ref 3.77–5.28)
RDW: 12.1 % (ref 11.7–15.4)
WBC: 5.8 10*3/uL (ref 3.4–10.8)

## 2020-09-12 LAB — QUANTIFERON-TB GOLD PLUS
QuantiFERON Mitogen Value: >10 IU/mL
QuantiFERON Nil Value: 0.02 IU/mL
QuantiFERON TB1 Ag Value: 0.02 IU/mL
QuantiFERON TB2 Ag Value: 0.02 IU/mL
QuantiFERON-TB Gold Plus: NEGATIVE

## 2020-09-12 LAB — IGG, IGA, IGM
IgA/Immunoglobulin A, Serum: 86 mg/dL — ABNORMAL LOW (ref 87–352)
IgG (Immunoglobin G), Serum: 809 mg/dL (ref 586–1602)
IgM (Immunoglobulin M), Srm: 31 mg/dL (ref 26–217)

## 2020-09-12 LAB — HEPATITIS B SURFACE ANTIGEN: Hepatitis B Surface Ag: NEGATIVE

## 2020-09-12 LAB — HEPATITIS C ANTIBODY: Hep C Virus Ab: <0.1 s/co ratio (ref 0.0–0.9)

## 2020-09-12 LAB — HEPATITIS B CORE ANTIBODY, IGM: Hep B C IgM: NEGATIVE

## 2020-09-12 NOTE — Telephone Encounter (Signed)
Chest x-ray completed on 09/09/20  Baseline labs are now updated and in place to proceed with starting Tremfya  Patient scheduled for Trefmya new start on 09/16/20 @ 10am. Medication received from company and in refrigerator  Knox Saliva, PharmD, MPH Clinical Pharmacist (Rheumatology and Pulmonology)

## 2020-09-12 NOTE — Progress Notes (Signed)
TB gold negative

## 2020-09-15 ENCOUNTER — Other Ambulatory Visit: Payer: Self-pay | Admitting: Family Medicine

## 2020-09-15 NOTE — Telephone Encounter (Signed)
Last office visit 08/16/20 for [place].  Last refilled 08/09/2020 for #180 with no refills.  Next Appt: 02/18/21 for follow up chronic pain meds.

## 2020-09-16 ENCOUNTER — Ambulatory Visit: Payer: Medicare Other | Admitting: Pharmacist

## 2020-09-16 ENCOUNTER — Other Ambulatory Visit: Payer: Self-pay

## 2020-09-16 DIAGNOSIS — Z7189 Other specified counseling: Secondary | ICD-10-CM

## 2020-09-16 DIAGNOSIS — L4050 Arthropathic psoriasis, unspecified: Secondary | ICD-10-CM

## 2020-09-16 DIAGNOSIS — L409 Psoriasis, unspecified: Secondary | ICD-10-CM

## 2020-09-16 DIAGNOSIS — Z79899 Other long term (current) drug therapy: Secondary | ICD-10-CM

## 2020-09-16 NOTE — Progress Notes (Signed)
Pharmacy Note  Subjective:   Patient presents to clinic today to receive first dose of Tremfya for psoriatic arthritis/psoriasis. She was diagnosed with psoriatic arthirits by Dr. Merryl Hacker with a prevalent family history of  PsA/psoriasis.  Patient running a fever or have signs/symptoms of infection? No  Patient currently on antibiotics for the treatment of infection? No  Patient have any upcoming invasive procedures/surgeries? No  Objective: CMP     Component Value Date/Time   NA 138 08/12/2020 0811   K 4.2 08/12/2020 0811   CL 105 08/12/2020 0811   CO2 25 08/12/2020 0811   GLUCOSE 136 (H) 08/12/2020 0811   BUN 17 08/12/2020 0811   CREATININE 0.83 08/12/2020 0811   CREATININE 0.85 07/16/2020 0855   CALCIUM 9.5 08/12/2020 0811   PROT 6.4 08/12/2020 0811   ALBUMIN 4.2 08/12/2020 0811   AST 15 08/12/2020 0811   ALT 23 08/12/2020 0811   ALKPHOS 95 08/12/2020 0811   BILITOT 0.4 08/12/2020 0811   GFRNONAA 72 07/16/2020 0855   GFRAA 83 07/16/2020 0855    CBC    Component Value Date/Time   WBC 5.8 09/09/2020 0907   WBC 6.4 07/16/2020 0855   RBC 4.22 09/09/2020 0907   RBC 4.37 07/16/2020 0855   HGB 13.1 09/09/2020 0907   HCT 38.1 09/09/2020 0907   PLT 226 09/09/2020 0907   MCV 90 09/09/2020 0907   MCH 31.0 09/09/2020 0907   MCH 31.6 07/16/2020 0855   MCHC 34.4 09/09/2020 0907   MCHC 34.2 07/16/2020 0855   RDW 12.1 09/09/2020 0907   LYMPHSABS 1.7 09/09/2020 0907   MONOABS 1.1 (H) 04/19/2019 1250   EOSABS 0.1 09/09/2020 0907   BASOSABS 0.0 09/09/2020 0907    Baseline Immunosuppressant Therapy Labs TB GOLD Quantiferon TB Gold Latest Ref Rng & Units 09/09/2020  Quantiferon TB Gold Plus Negative Negative   Hepatitis Panel Hepatitis Latest Ref Rng & Units 09/09/2020  Hep B Surface Ag Negative Negative  Hep B IgM Negative Negative  Hep C Ab NEGATIVE -   HIV Lab Results  Component Value Date   HIV Non Reactive 12/13/2018   Immunoglobulins Immunoglobulin  Electrophoresis Latest Ref Rng & Units 09/09/2020  IgG 586 - 1,602 mg/dL 809  IgM 26 - 217 mg/dL 31   SPEP Serum Protein Electrophoresis Latest Ref Rng & Units 08/12/2020  Total Protein 6.0 - 8.3 g/dL 6.4   Chest x-ray: 09/09/20 - no active cardiopulmonary disease  Assessment/Plan:  Counseled patient that Tremfya is a IL-23 inhibitor.  Counseled patient on purpose, proper use, and adverse effects of Tremfya.  Reviewed the most common adverse effects including infection, URTIs, injection site reactions, nausea/diarrhea, and recurrence of tinea and HSV infections Counseled patient that Tremfya should be held for infection and prior to scheduled surgery.   She has received 2 Moderna COVID19 vaccines and is eligible for booster dose. She has received Prevnar 20 vaccine on 08/16/20. Has received one dose of live zoster vaccine in 2016 and is eligible for inactivated 2-dose zoster vaccine series. She is UTD on influenza vaccine  Reviewed the importance of regular labs while on Tremfya therapy.  Will monitor CBC and CMP 1 month after starting and then every 3 months routinely thereafter. Will monitor TB gold annually. Standing orders placed.  Provided patient with medication education material and answered all questions.  Patient voiced understanding.  Patient consented to [facility].  Will upload consent into the media tab.  Reviewed storage instructions of Tremfya.    Demonstrated proper  injection technique with Tremfya demo device  Patient able to demonstrate proper injection technique using the teach back method.  Patient self injected in the right thigh with:  Pharmacy-Supplied Medication: Tremfya 100mg /mL auto-injector NDC: [address]-04 Lot: [id number] Expiration: 12/2021  Patient tolerated well.  Observed for 30 mins in office for adverse reaction and none noted.   Patient is to return in 1 month for labs and 6-8 weeks for follow-up appointment.  Standing orders placed.   Tremfya approved through  [employer] patient assistance.  Patient was sent home with one Tremfya auto-injector pen that was delivered from pharmacy.  Prescription sent to [facility] for maintenance dose of 1 injector.  All questions encouraged and answered.  Instructed patient to call with any further questions or concerns.  Knox Saliva, PharmD, MPH Clinical Pharmacist (Rheumatology and Pulmonology)  09/16/2020 8:08 AM

## 2020-09-16 NOTE — Patient Instructions (Signed)
Your next Tremfya dose is due on 10/14/20, 12/09/20, and every 8 weeks thereafter  Your prescription will be shipped from [facility]. Their phone number is [phone] They will call to schedule shipment and confirm address. They will mail medication to your home.  Labs are due in 1 month then every 3 months.  Remember the 5 C's: COUNTER - leave on the counter at least 30 minutes but up to overnight to bring medication to room temperature. This may help prevent stinging COLD - place something cold (like an ice gel pack or cold water bottle) on the injection site just before cleansing with alcohol. This may help reduce pain CLARITIN - use Claritin (generic name is loratadine) for the first two weeks of treatment or the day of, the day before, and the day after injecting. This will help to minimize injection site reactions CORTISONE CREAM - apply if injection site is irritated and itching CALL ME - if injection site reaction is bigger than the size of your fist, looks infected, blisters, or if you develop hives

## 2020-09-17 ENCOUNTER — Other Ambulatory Visit: Payer: Self-pay | Admitting: Family Medicine

## 2020-09-17 ENCOUNTER — Other Ambulatory Visit (HOSPITAL_COMMUNITY): Payer: Self-pay

## 2020-09-17 MED ORDER — TREMFYA 100 MG/ML ~~LOC~~ SOAJ
100.0000 mg | SUBCUTANEOUS | 0 refills | Status: DC
  Filled 2020-09-17: qty 1, fill #0
  Filled 2020-12-05: qty 1, 56d supply, fill #0

## 2020-09-18 NOTE — Telephone Encounter (Signed)
Last office visit 08/16/2020 for Jillian [name].  Last refilled Lyrica: 08/09/2020 for #180 with no refills.  Next Appt: 02/18/2021 for follow up chronic pain.   Vit D: Last refilled 08/16/2020 for #12 with 1 refill but [facility] is requesting a year supply. Ok to refill?

## 2020-10-14 ENCOUNTER — Telehealth: Payer: Self-pay | Admitting: Rheumatology

## 2020-10-14 ENCOUNTER — Other Ambulatory Visit: Payer: Self-pay | Admitting: Family Medicine

## 2020-10-14 DIAGNOSIS — Z79899 Other long term (current) drug therapy: Secondary | ICD-10-CM

## 2020-10-14 NOTE — Telephone Encounter (Signed)
Last office visit 08/16/2020 for [place].  Last refilled 09/18/2020 for #180 with no refills.  Next Appt: 02/18/2021 for pain management.

## 2020-10-14 NOTE — Telephone Encounter (Signed)
Lab Orders released.  

## 2020-10-14 NOTE — Telephone Encounter (Signed)
Patient going to Jillian [name] in [place] for draw today. Please release orders.

## 2020-10-15 DIAGNOSIS — Z79899 Other long term (current) drug therapy: Secondary | ICD-10-CM | POA: Diagnosis not present

## 2020-10-16 ENCOUNTER — Encounter: Payer: Self-pay | Admitting: Skilled Nursing Facility1

## 2020-10-16 ENCOUNTER — Encounter: Payer: Medicare Other | Attending: Surgery | Admitting: Skilled Nursing Facility1

## 2020-10-16 ENCOUNTER — Other Ambulatory Visit: Payer: Self-pay

## 2020-10-16 DIAGNOSIS — M797 Fibromyalgia: Secondary | ICD-10-CM | POA: Insufficient documentation

## 2020-10-16 DIAGNOSIS — G4730 Sleep apnea, unspecified: Secondary | ICD-10-CM | POA: Insufficient documentation

## 2020-10-16 DIAGNOSIS — Z713 Dietary counseling and surveillance: Secondary | ICD-10-CM | POA: Insufficient documentation

## 2020-10-16 DIAGNOSIS — Z6837 Body mass index (BMI) 37.0-37.9, adult: Secondary | ICD-10-CM | POA: Insufficient documentation

## 2020-10-16 LAB — CBC WITH DIFFERENTIAL/PLATELET
Basophils Absolute: 0 10*3/uL (ref 0.0–0.2)
Basos: 1 %
EOS (ABSOLUTE): 0.1 10*3/uL (ref 0.0–0.4)
Eos: 2 %
Hematocrit: 39.2 % (ref 34.0–46.6)
Hemoglobin: 13.3 g/dL (ref 11.1–15.9)
Immature Grans (Abs): 0 10*3/uL (ref 0.0–0.1)
Immature Granulocytes: 0 %
Lymphocytes Absolute: 1.6 10*3/uL (ref 0.7–3.1)
Lymphs: 27 %
MCH: 31.1 pg (ref 26.6–33.0)
MCHC: 33.9 g/dL (ref 31.5–35.7)
MCV: 92 fL (ref 79–97)
Monocytes Absolute: 0.8 10*3/uL (ref 0.1–0.9)
Monocytes: 14 %
Neutrophils Absolute: 3.3 10*3/uL (ref 1.4–7.0)
Neutrophils: 56 %
Platelets: 215 10*3/uL (ref 150–450)
RBC: 4.27 x10E6/uL (ref 3.77–5.28)
RDW: 12.6 % (ref 11.7–15.4)
WBC: 5.8 10*3/uL (ref 3.4–10.8)

## 2020-10-16 LAB — CMP14+EGFR
ALT: 29 IU/L (ref 0–32)
AST: 18 IU/L (ref 0–40)
Albumin/Globulin Ratio: 2.1 (ref 1.2–2.2)
Albumin: 4.2 g/dL (ref 3.8–4.8)
Alkaline Phosphatase: 106 IU/L (ref 44–121)
BUN/Creatinine Ratio: 14 (ref 12–28)
BUN: 13 mg/dL (ref 8–27)
Bilirubin Total: 0.3 mg/dL (ref 0.0–1.2)
CO2: 25 mmol/L (ref 20–29)
Calcium: 9.5 mg/dL (ref 8.7–10.3)
Chloride: 104 mmol/L (ref 96–106)
Creatinine, Ser: 0.92 mg/dL (ref 0.57–1.00)
Globulin, Total: 2 g/dL (ref 1.5–4.5)
Glucose: 97 mg/dL (ref 65–99)
Potassium: 4.6 mmol/L (ref 3.5–5.2)
Sodium: 141 mmol/L (ref 134–144)
Total Protein: 6.2 g/dL (ref 6.0–8.5)
eGFR: 69 mL/min/{1.73_m2} (ref >59)

## 2020-10-16 NOTE — Progress Notes (Signed)
Nutrition Assessment for Bariatric Surgery Medical Nutrition Therapy Appt Start Time: 10:55    End Time: 11:55  Patient was seen on 10/16/2020 for Pre-Operative Nutrition Assessment. Letter of approval faxed to [facility] bariatric surgery program coordinator on 10/16/2020.   Referral stated Supervised Weight Loss (SWL) visits needed: 6  DO NOT SHOW pt HER WEIGHT  Planned surgery: sleeve gastrectomy  Pt expectation of surgery: to lose weight  Pt expectation of dietitian: none identified     NUTRITION ASSESSMENT   Anthropometrics  Start weight at [facility]: 242 lbs (date: 10/16/2020)  Height: 68 in BMI: 36.80 kg/m2     Clinical  Medical hx: Sleep apnea, psoriasis, fibromyalgia  Medications: see list  Labs:  Notable signs/symptoms: chronic pain Any previous deficiencies? Yes: vitamin D  Micronutrient Nutrition Focused Physical Exam: Hair: No issues observed Eyes: No issues observed Mouth: No issues observed Neck: No issues observed Nails: No issues observed Skin: No issues observed  Lifestyle & Dietary Hx  Pt states she wears her C-PAP nightly.   Pt states the shots she is getting for psoriasis seem to be helping with the fibro pain.  Pt states she noticed she wants sugar at 3pm daily. Pt states she sometimes eats in the middle of the night.  Pt states she does eat past the point of fullness 3 times a week stating she is doing this from starting this surgery process stating when she focuses on weight she typically will overeat due to the pressure.     24-Hr Dietary Recall First Meal: tortilla + cream cheese + jelly Snack:  Second Meal: grilled cheese or egg sandwich Snack: fruit or dessert Third Meal: fried chicken tenders + white rice + green beans  Snack: yogurt  Beverages: diet tonic water, coffee + half and half   Estimated Energy Needs Calories: 1500   NUTRITION DIAGNOSIS  Overweight/obesity ([place]-3.3) related to past poor dietary habits and  physical inactivity as evidenced by patient w/ planned sleeve gastrectomy surgery following dietary guidelines for continued weight loss.    NUTRITION INTERVENTION  Nutrition counseling (C-1) and education (E-2) to facilitate bariatric surgery goals.  Educated pt on micronutrient deficiencies post surgery and strategies to mitigate that risk   Pre-Op Goals Reviewed with the Patient Track food and beverage intake (pen and paper, MyFitness Pal, Baritastic app, etc.) Make healthy food choices while monitoring portion sizes: eat non starchy vegetables 2 times a day 7 days a week Consume 3 meals per day or try to eat every 3-5 hours Avoid concentrated sugars and fried foods Keep sugar & fat in the single digits per serving on food labels Practice CHEWING your food (aim for applesauce consistency) Practice not drinking 15 minutes before, during, and 30 minutes after each meal and snack Avoid all carbonated beverages (ex: soda, sparkling beverages)  Limit caffeinated beverages (ex: coffee, tea, energy drinks) Avoid all sugar-sweetened beverages (ex: regular soda, sports drinks)  Avoid alcohol  Aim for 64-100 ounces of FLUID daily (with at least half of fluid intake being plain water)  Aim for at least 60-80 grams of PROTEIN daily Look for a liquid protein source that contains ?15 g protein and ?5 g carbohydrate (ex: shakes, drinks, shots) Make a list of non-food related activities Physical activity is an important part of a healthy lifestyle so keep it moving! The goal is to reach 150 minutes of exercise per week, including cardiovascular and weight baring activity.  *Goals that are bolded indicate the pt would like to  start working towards these  Handouts Provided Include  Bariatric Surgery handouts (Nutrition Visits, Pre-Op Goals, Protein Shakes, Vitamins & Minerals)  Learning Style & Readiness for Change Teaching method utilized: Visual & Auditory  Demonstrated degree of understanding  via: Teach Back  Readiness Level: contemplative Barriers to learning/adherence to lifestyle change: unknown  RD's Notes for Next Visit Assess pts adherence to chosen goals      MONITORING & EVALUATION Dietary intake, weekly physical activity, body weight, and pre-op goals reached at next nutrition visit.    Next Steps  Patient is to follow up at [facility] for Pre-Op Class >2 weeks before surgery for further nutrition education.

## 2020-10-20 ENCOUNTER — Other Ambulatory Visit: Payer: Self-pay | Admitting: Family Medicine

## 2020-10-21 NOTE — Progress Notes (Signed)
Office Visit Note  Patient: Jillian [name]             Date of Birth: 05-Nov-1955           MRN: [id number]             PCP: Jinny Sanders, MD Referring: Jinny Sanders, MD Visit Date: 11/04/2020 Occupation: '@GUAROCC' @  Subjective:  Medication management   History of Present Illness: Jillian [name] is a 65 y.o. female with a history of psoriatic arthritis, psoriasis and osteoarthritis.  She started Tremfya about 2 months ago.  She has not noticed much improvement so far.  She has not noticed any side effects from the medications.  He denies any joint swelling.  She describes psoriasis patches on the palmar aspects of both hands, left axilla and right thigh.  He has not noticed any improvement in psoriasis so far.  She continues to have some discomfort in her joints to treat her underlying osteoarthritis.  Fibromyalgia symptoms are better.  Activities of Daily Living:  Patient reports morning stiffness for 1-3 hours.   Patient Reports nocturnal pain.  Difficulty dressing/grooming: Denies Difficulty climbing stairs: Denies Difficulty getting out of chair: Denies Difficulty using hands for taps, buttons, cutlery, and/or writing: Denies  Review of Systems  Constitutional:  Positive for fatigue.  HENT:  Negative for mouth sores, mouth dryness and nose dryness.   Eyes:  Positive for dryness. Negative for pain and itching.  Respiratory:  Negative for shortness of breath and difficulty breathing.   Cardiovascular:  Negative for chest pain and palpitations.  Gastrointestinal:  Negative for blood in stool, constipation and diarrhea.  Endocrine: Negative for increased urination.  Genitourinary:  Negative for difficulty urinating.  Musculoskeletal:  Positive for joint pain, joint pain, myalgias, morning stiffness, muscle tenderness and myalgias. Negative for joint swelling.  Skin:  Negative for color change, rash and redness.  Allergic/Immunologic: Negative for susceptible to infections.   Neurological:  Positive for weakness. Negative for dizziness, numbness, headaches and memory loss.  Hematological:  Negative for bruising/bleeding tendency.  Psychiatric/Behavioral:  Negative for confusion.    PMFS History:  Patient Active Problem List   Diagnosis Date Noted   Pitting of nails 01/23/2020   OSA (obstructive sleep apnea) 04/12/2019   S/P left unicompartmental knee replacement 03/21/2019   Osteoarthritis of left knee 03/09/2019   Primary localized osteoarthritis of right knee 02/07/2019   Status post right partial knee replacement 02/07/2019   Weight gain 07/14/2018   Class 2 severe obesity with serious comorbidity and body mass index (BMI) of 37.0 to 37.9 in adult [facility]) 05/13/2018   Prediabetes 12/03/2017   Non-restorative sleep 02/27/2016   Vitamin D deficiency 10/28/2015   Generalized anxiety disorder 11/25/2012   HYPERTRIGLYCERIDEMIA 12/20/2008   MENOPAUSE, EARLY 10/04/2008   Fibromyalgia 10/04/2008    Past Medical History:  Diagnosis Date   Cancer ([place])    basal cell 2016   Dermatitis    per patient    Fibromyalgia    Pre-diabetes    Primary localized osteoarthritis of right knee 02/07/2019   Sleep apnea    uses CPAP    Family History  Problem Relation Age of Onset   Cancer Father        colon   Hyperlipidemia Father    Hypertension Father    Diabetes Sister    Cancer Paternal Aunt        colon   Cancer Maternal Grandmother  colon   Hypertension Maternal Grandmother    Anuerysm Paternal Grandfather    Psoriasis Brother    Arthritis Brother    Psoriasis Sister    Osteoarthritis Brother    Diabetes Brother    Throat cancer Brother    Healthy Son    Healthy Daughter    Past Surgical History:  Procedure Laterality Date   CESAREAN SECTION     times 2   EYE SURGERY Bilateral 04/24/2020   bilateral eyelids    HERNIA REPAIR     PARTIAL KNEE ARTHROPLASTY Right 02/07/2019   Procedure: UNICOMPARTMENTAL KNEE;  Surgeon: Marchia Bond,  MD;  Location: [facility];  Service: Orthopedics;  Laterality: Right;   PARTIAL KNEE ARTHROPLASTY Left 03/21/2019   Procedure: UNICOMPARTMENTAL KNEE;  Surgeon: Marchia Bond, MD;  Location: [facility];  Service: Orthopedics;  Laterality: Left;   Social History   Social History Narrative   Regular exercise-yes, but seasonal walks and swims   Diet: healthy, low carbohydrates, no caffeine   Immunization History  Administered Date(s) Administered   Influenza-Unspecified 01/01/2020   Moderna Sars-Covid-2 Vaccination 05/23/2019, 07/04/2019   PNEUMOCOCCAL CONJUGATE-20 08/16/2020   Td 03/30/2004   Tdap 10/28/2015   Zoster, Live 08/30/2014     Objective: Vital Signs: BP 121/80 (BP Location: Left Arm, Patient Position: Sitting, Cuff Size: Normal)   Pulse 73   Ht '5\' 8"'  (1.727 m)   Wt 243 lb (110.2 kg)   BMI 36.95 kg/m    Physical Exam Vitals and nursing note reviewed.  Constitutional:      Appearance: She is well-developed.  HENT:     Head: Normocephalic and atraumatic.  Eyes:     Conjunctiva/sclera: Conjunctivae normal.  Cardiovascular:     Rate and Rhythm: Normal rate and regular rhythm.     Heart sounds: Normal heart sounds.  Pulmonary:     Effort: Pulmonary effort is normal.     Breath sounds: Normal breath sounds.  Abdominal:     General: Bowel sounds are normal.     Palpations: Abdomen is soft.  Musculoskeletal:     Cervical back: Normal range of motion.  Lymphadenopathy:     Cervical: No cervical adenopathy.  Skin:    General: Skin is warm and dry.     Capillary Refill: Capillary refill takes less than 2 seconds.     Comments: Psoriasis patches were noted on the nape of her neck, behind ears, left axilla, bilateral palms and right thigh.  Neurological:     Mental Status: She is alert and oriented to person, place, and time.  Psychiatric:        Behavior: Behavior normal.     Musculoskeletal Exam: C-spine was in good range of motion.  Thoracic and lumbar spine with good  range of motion.  She had tenderness over bilateral SI joints.  Shoulder joints, elbow joints, wrist joints, MCPs PIPs and DIPs with good range of motion with no synovitis.  Hip joints with good range of motion.  Bilateral knee joints are replaced without any warmth swelling or effusion.  There was no tenderness over ankles or MTPs.  She had no plantar fasciitis or Achilles tendinitis.  CDAI Exam: CDAI Score: -- Patient Global: --; Provider Global: -- Swollen: --; Tender: -- Joint Exam 11/04/2020   No joint exam has been documented for this visit   There is currently no information documented on the homunculus. Go to the Rheumatology activity and complete the homunculus joint exam.  Investigation: No additional findings.  Imaging:  No results found.  Recent Labs: Lab Results  Component Value Date   WBC 5.8 10/15/2020   HGB 13.3 10/15/2020   PLT 215 10/15/2020   NA 141 10/15/2020   K 4.6 10/15/2020   CL 104 10/15/2020   CO2 25 10/15/2020   GLUCOSE 97 10/15/2020   BUN 13 10/15/2020   CREATININE 0.92 10/15/2020   BILITOT 0.3 10/15/2020   ALKPHOS 106 10/15/2020   AST 18 10/15/2020   ALT 29 10/15/2020   PROT 6.2 10/15/2020   ALBUMIN 4.2 10/15/2020   CALCIUM 9.5 10/15/2020   GFRAA 83 07/16/2020   QFTBGOLDPLUS Negative 09/09/2020    Speciality Comments: No specialty comments available.  Procedures:  No procedures performed Allergies: Patient has no known allergies.   Assessment / Plan:     Visit Diagnoses: Psoriatic arthritis ([place]) - Active psoriasis, plantar fasciitis, fingernail pitting, RF-, HLA-B27-, chronic SI joint pain, family history of psoriasis and psoriatic arthritis.  Patient had no synovitis on my examination today.  She had complained of fasciitis.  Although she states that she has not noticed any improvement in her symptoms so far.  Psoriasis - Diagnosed by Dr. Venetia Maxon.  She has psoriasis lesions on her scalp, behind ears, palmar aspect in her extremities.   She has not noticed any improvement with psoriasis of her.  Pitting of nails  High risk medication use -she is on Tremfya 100 mg/mL subcu every 8 weeks.  Her labs are stable.  She will get labs every 3 months.  Primary osteoarthritis of both hands - Radiographic findings on 04/08/2020 were consistent with osteoarthritic changes no synovitis was noted.  Primary osteoarthritis of both feet - Radiographic findings from 04/08/2020 were consistent with osteoarthritic changes in both feet.  No synovitis or plantar fasciitis was noted today.  Status post right partial knee replacement - Dr. Silva Bandy on 02/07/2019.    S/P left unicompartmental knee replacement - Dr. Silva Bandy on 03/21/2019.    Chronic SI joint pain - X-rays of the pelvis were unremarkable on 04/08/2020.  She continues to have some lower back pain which could be related to fibromyalgia.  Family history of psoriatic arthritis - Sister and brother   Fibromyalgia-she states her overall fibromyalgia symptoms are doing better.  OSA (obstructive sleep apnea)  Vitamin D deficiency  Non-restorative sleep  Prediabetes  Generalized anxiety disorder  Orders: No orders of the defined types were placed in this encounter.  No orders of the defined types were placed in this encounter.    Follow-Up Instructions: Return in about 3 months (around 02/04/2021) for Psoriatic arthritis.   Bo Merino, MD  Note - This record has been created using Editor, commissioning.  Chart creation errors have been sought, but may not always  have been located. Such creation errors do not reflect on  the standard of medical care.

## 2020-11-04 ENCOUNTER — Other Ambulatory Visit: Payer: Self-pay

## 2020-11-04 ENCOUNTER — Encounter: Payer: Self-pay | Admitting: Rheumatology

## 2020-11-04 ENCOUNTER — Ambulatory Visit: Payer: Medicare Other | Admitting: Rheumatology

## 2020-11-04 VITALS — BP 121/80 | HR 73 | Ht 68.0 in | Wt 243.0 lb

## 2020-11-04 DIAGNOSIS — M19072 Primary osteoarthritis, left ankle and foot: Secondary | ICD-10-CM

## 2020-11-04 DIAGNOSIS — Z96652 Presence of left artificial knee joint: Secondary | ICD-10-CM | POA: Diagnosis not present

## 2020-11-04 DIAGNOSIS — L4050 Arthropathic psoriasis, unspecified: Secondary | ICD-10-CM

## 2020-11-04 DIAGNOSIS — M797 Fibromyalgia: Secondary | ICD-10-CM | POA: Diagnosis not present

## 2020-11-04 DIAGNOSIS — Z8261 Family history of arthritis: Secondary | ICD-10-CM | POA: Diagnosis not present

## 2020-11-04 DIAGNOSIS — M19041 Primary osteoarthritis, right hand: Secondary | ICD-10-CM | POA: Diagnosis not present

## 2020-11-04 DIAGNOSIS — G478 Other sleep disorders: Secondary | ICD-10-CM

## 2020-11-04 DIAGNOSIS — E559 Vitamin D deficiency, unspecified: Secondary | ICD-10-CM

## 2020-11-04 DIAGNOSIS — M19071 Primary osteoarthritis, right ankle and foot: Secondary | ICD-10-CM

## 2020-11-04 DIAGNOSIS — Z79899 Other long term (current) drug therapy: Secondary | ICD-10-CM

## 2020-11-04 DIAGNOSIS — Z96651 Presence of right artificial knee joint: Secondary | ICD-10-CM | POA: Diagnosis not present

## 2020-11-04 DIAGNOSIS — M533 Sacrococcygeal disorders, not elsewhere classified: Secondary | ICD-10-CM

## 2020-11-04 DIAGNOSIS — R7303 Prediabetes: Secondary | ICD-10-CM

## 2020-11-04 DIAGNOSIS — G4733 Obstructive sleep apnea (adult) (pediatric): Secondary | ICD-10-CM

## 2020-11-04 DIAGNOSIS — F411 Generalized anxiety disorder: Secondary | ICD-10-CM

## 2020-11-04 DIAGNOSIS — L608 Other nail disorders: Secondary | ICD-10-CM | POA: Diagnosis not present

## 2020-11-04 DIAGNOSIS — Z840 Family history of diseases of the skin and subcutaneous tissue: Secondary | ICD-10-CM

## 2020-11-04 DIAGNOSIS — M19042 Primary osteoarthritis, left hand: Secondary | ICD-10-CM

## 2020-11-04 DIAGNOSIS — G8929 Other chronic pain: Secondary | ICD-10-CM

## 2020-11-04 DIAGNOSIS — L409 Psoriasis, unspecified: Secondary | ICD-10-CM

## 2020-11-04 NOTE — Patient Instructions (Addendum)
The pharmacy ([facility]) will reach out to you to schedule shipment of Tremfya.   You are due on 9/12, 11/7 and every 8 weeks  Standing Labs We placed an order today for your standing lab work.   Please have your standing labs drawn in October and every 25month  If possible, please have your labs drawn 2 weeks prior to your appointment so that the provider can discuss your results at your appointment.  Please note that you may see your imaging and lab results in [facility] we have reviewed them. We may be awaiting multiple results to interpret others before contacting you. Please allow our office up to 72 hours to thoroughly review all of the results before contacting the office for clarification of your results.  We have open lab daily: Monday through Thursday from 1:30-4:30 PM and Friday from 1:30-4:00 PM at the office of Dr. [facility].   Please be advised, all patients with office appointments requiring lab work will take precedent over walk-in lab work.  If possible, please come for your lab work on Monday and Friday afternoons, as you may experience shorter wait times. The office is located at [address] [place]  [id number] appointment is necessary.   Labs are drawn by [facility]. Please bring your co-pay at the time of your lab draw.  You may receive a bill from [facility] your [facility] work.  If you wish to have your labs drawn at another location, please call the office 24 hours in advance to send orders.  If you have any questions regarding directions or hours of operation,  please call [phone]   As a reminder, please drink plenty of water prior to coming for your lab work. Thanks!  If you test POSITIVE for COVID19 and have MILD to MODERATE symptoms: First, call your PCP if you would like to receive COVID19 treatment AND Hold your medications during the infection and for at least 1 week  after your symptoms have resolved: Injectable medication (Benlysta, Cimzia, Cosentyx, Enbrel, Humira, Orencia, Remicade, Simponi, Stelara, Taltz, Tremfya) Methotrexate Leflunomide (Arava) Azathioprine Mycophenolate (Cellcept) XRoma Kayser or Rinvoq Otezla If you take Actemra or Kevzara, you DO NOT need to hold these for COVID19 infection.  If you test POSITIVE for COVID19 and have NO symptoms: First, call your PCP if you would like to receive COVID19 treatment AND Hold your medications for at least 10 days after the day that you tested positive Injectable medication (Benlysta, Cimzia, Cosentyx, Enbrel, Humira, Orencia, Remicade, Simponi, Stelara, Taltz, Tremfya) Methotrexate Leflunomide (Arava) Azathioprine Mycophenolate (Cellcept) XRoma Kayser or Rinvoq Otezla If you take Actemra or Kevzara, you DO NOT need to hold these for COVID19 infection.  If you have signs or symptoms of an infection or start antibiotics: First, call your PCP for workup of your infection. Hold your medication through the infection, until you complete your antibiotics, and until symptoms resolve if you take the following: Injectable medication (Actemra, Benlysta, Cimzia, Cosentyx, Enbrel, Humira, Kevzara, Orencia, Remicade, Simponi, Stelara, Taltz, Tremfya) Methotrexate Leflunomide (Arava) Mycophenolate (Cellcept) XMorrie Sheldon Olumiant, or Rinvoq  Vaccines You are taking a medication(s) that can suppress your immune system.  The following immunizations are recommended: Flu annually Covid-19  Td/Tdap (tetanus, diphtheria, pertussis) every 10 years Pneumonia (Prevnar 15 then Pneumovax 23 at least 1 year apart.  Alternatively, can take Prevnar 20 without needing additional dose) Shingrix (after age 66: 2 doses from 4 weeks to 6 months  apart  Please check with your PCP to make sure you are up to date.   Marland Kitchen

## 2020-11-11 ENCOUNTER — Encounter: Payer: Medicare Other | Attending: Surgery | Admitting: Skilled Nursing Facility1

## 2020-11-11 ENCOUNTER — Other Ambulatory Visit: Payer: Self-pay

## 2020-11-11 DIAGNOSIS — Z6836 Body mass index (BMI) 36.0-36.9, adult: Secondary | ICD-10-CM | POA: Insufficient documentation

## 2020-11-11 DIAGNOSIS — M797 Fibromyalgia: Secondary | ICD-10-CM | POA: Diagnosis not present

## 2020-11-11 DIAGNOSIS — Z713 Dietary counseling and surveillance: Secondary | ICD-10-CM | POA: Diagnosis not present

## 2020-11-11 DIAGNOSIS — G4730 Sleep apnea, unspecified: Secondary | ICD-10-CM | POA: Diagnosis not present

## 2020-11-11 DIAGNOSIS — Z6837 Body mass index (BMI) 37.0-37.9, adult: Secondary | ICD-10-CM

## 2020-11-11 NOTE — Progress Notes (Signed)
Supervised Weight Loss Visit Bariatric Nutrition Education  Planned Surgery: sleeve gastrectomy   Pt Expectation of Surgery/ Goals: greater mobility   1 out of 6 SWL Appointments    NUTRITION ASSESSMENT  Anthropometrics  Start weight at [facility]: 242 lbs (date: 10/16/2020) Today's weight: 240.8 lbs Weight change: -2 lbs BMI: 36.54 kg/m2    Clinical  Medical hx: Sleep apnea, psoriasis, fibromyalgia  Medications: see list  Labs:  Notable signs/symptoms: chronic pain Any previous deficiencies? Yes: vitamin D  Lifestyle & Dietary Hx  Pt states she accidentally saw her weight at the doctors off ice which really stressed her out.  Pt states she did add non starchy vegetables to lunch and dinner daily.  Pt states she really loves salad.  Pt states since adding in veggies she has noticed her skin is clearer.  Pt states bit drinking with meals has been tough.  Pt states she has a lot of responsibilities getting in the way of her being physically active between her mother and her sister.  Pt states she would like to try salsa dancing and water aerobics, Just not this month.    Estimated daily fluid intake:  oz Supplements:  Current average weekly physical activity: walking on cooler days inconsistent   24-Hr Dietary Recall First Meal: english muffin + cream cheese + jam Snack:  Second Meal: cheese quesadilla + salad: raw squash, spinach tomato, red onion, romaine + cheese + rainsins + broccoli + lite dressing Snack: grapes Third Meal: hamburger and french fries  Snack: watermelon or mixed nuts/dates coated in maple Beverages: diet tonic water, 2-3 coffee + half and half  Estimated Energy Needs Calories: 1600   NUTRITION DIAGNOSIS  Overweight/obesity ([place]-3.3) related to past poor dietary habits and physical inactivity as evidenced by patient w/ planned sleeve  surgery following dietary guidelines for continued weight loss.   NUTRITION INTERVENTION  Nutrition counseling (C-1)  and education (E-2) to facilitate bariatric surgery goals.  Pre-Op Goals Progress & New Goals Continue: Add non starchy vegetables to lunch and dinner daily Continue: stop drinking with meals NEW: avoid carbonated beverages  NEW: work on one snack in the evening   Handouts Provided Include    Learning Style & Readiness for Change Teaching method utilized: Visual & Auditory  Demonstrated degree of understanding via: Teach Back  Readiness Level: Action Barriers to learning/adherence to lifestyle change: none identified   RD's Notes for next Visit  Assess pts adherence to chosen goals    MONITORING & EVALUATION Dietary intake, weekly physical activity, body weight, and pre-op goals in 1 month.   Next Steps  Patient is to return to [facility] in 1 month

## 2020-11-13 ENCOUNTER — Other Ambulatory Visit: Payer: Self-pay | Admitting: Family Medicine

## 2020-11-14 NOTE — Telephone Encounter (Signed)
Last office visit 08/16/2020 for [place].  Last refilled:  Tramadol 10/15/2020 for #180 with no refills.  Lyrica 09/18/20 for #180 with no refills.  Next Appt: 02/18/2021 for chronic pain management.

## 2020-12-05 ENCOUNTER — Other Ambulatory Visit (HOSPITAL_COMMUNITY): Payer: Self-pay

## 2020-12-09 ENCOUNTER — Other Ambulatory Visit (HOSPITAL_COMMUNITY): Payer: Self-pay

## 2020-12-10 ENCOUNTER — Other Ambulatory Visit: Payer: Self-pay | Admitting: Family Medicine

## 2020-12-10 NOTE — Telephone Encounter (Signed)
Last office visit 08/16/2020 for Jillian [name].  Last refilled 11/14/2020 for #180 with no refills. Next Appt: 02/18/2021 for chronic pain.

## 2020-12-12 ENCOUNTER — Other Ambulatory Visit: Payer: Self-pay

## 2020-12-12 ENCOUNTER — Encounter: Payer: Medicare Other | Attending: Surgery | Admitting: Skilled Nursing Facility1

## 2020-12-12 DIAGNOSIS — M797 Fibromyalgia: Secondary | ICD-10-CM | POA: Diagnosis not present

## 2020-12-12 DIAGNOSIS — Z6837 Body mass index (BMI) 37.0-37.9, adult: Secondary | ICD-10-CM | POA: Diagnosis present

## 2020-12-12 DIAGNOSIS — G4730 Sleep apnea, unspecified: Secondary | ICD-10-CM | POA: Diagnosis not present

## 2020-12-12 NOTE — Progress Notes (Signed)
Supervised Weight Loss Visit Bariatric Nutrition Education  Planned Surgery: sleeve gastrectomy   Pt Expectation of Surgery/ Goals: greater mobility   2 out of 6 SWL Appointments    NUTRITION ASSESSMENT  Anthropometrics  Start weight at [facility]: 242 lbs (date: 10/16/2020) Today's weight: 245.5 lbs Weight change: -5lbs BMI: 37.33 kg/m2    Clinical  Medical hx: Sleep apnea, psoriasis, fibromyalgia  Medications: see list  Labs:  Notable signs/symptoms: chronic pain Any previous deficiencies? Yes: vitamin D  Lifestyle & Dietary Hx  Pt states shes only drinks water and coffee now.  Pt state she went to support group last month and will go tonight.  Pt states she has started going to the gym with her husband for 30 minutes 3 days a  week.  Pt states she recognizes she needs to cut back on snacks.  Pt state she knows she wants to comfort herself with food when she is having a fibromyalgia flare. Pt state she does realize she eats out of [employer] and also her sister stressing her out.   Estimated daily fluid intake:  oz Supplements:  Current average weekly physical activity: walking on cooler days inconsistent; gym 3 days a week  24-Hr Dietary Recall First Meal: tortillas + cream cheese + jam Snack:  Second Meal: sausage egg and cheese back with croissant rolls + salad or tomatoes Snack: grapes Third Meal: hamburger and french fries or pepper steak + salad (some fruit on it) Snack: watermelon or mixed nuts/dates coated in maple Beverages: diet tonic water, 2-3 coffee + half and half  Estimated Energy Needs Calories: 1600   NUTRITION DIAGNOSIS  Overweight/obesity ([place]-3.3) related to past poor dietary habits and physical inactivity as evidenced by patient w/ planned sleeve  surgery following dietary guidelines for continued weight loss.   NUTRITION INTERVENTION  Nutrition counseling (C-1) and education (E-2) to facilitate bariatric surgery goals.  Pre-Op Goals Progress & New  Goals Continue: Add non starchy vegetables to lunch and dinner daily Continue: stop drinking with meals continue: avoid carbonated beverages  continue: work on one snack in the evening  NEW: create a chill out/craft space for keeping fun stuff   Handouts Provided Include    Learning Style & Readiness for Change Teaching method utilized: Visual & Auditory  Demonstrated degree of understanding via: Teach Back  Readiness Level: Action Barriers to learning/adherence to lifestyle change: none identified   RD's Notes for next Visit  Assess pts adherence to chosen goals    MONITORING & EVALUATION Dietary intake, weekly physical activity, body weight, and pre-op goals in 1 month.   Next Steps  Patient is to return to [facility] in 1 month

## 2020-12-18 ENCOUNTER — Ambulatory Visit: Payer: Self-pay | Admitting: Surgery

## 2021-01-05 ENCOUNTER — Other Ambulatory Visit: Payer: Self-pay | Admitting: Family Medicine

## 2021-01-07 NOTE — Telephone Encounter (Signed)
Last OV- 08/16/2020 Next OV-02/18/2021 Last Filled- 12/10/2020

## 2021-01-13 ENCOUNTER — Encounter: Payer: Medicare Other | Attending: Surgery | Admitting: Skilled Nursing Facility1

## 2021-01-13 ENCOUNTER — Other Ambulatory Visit: Payer: Self-pay

## 2021-01-13 DIAGNOSIS — M797 Fibromyalgia: Secondary | ICD-10-CM | POA: Diagnosis not present

## 2021-01-13 DIAGNOSIS — G4730 Sleep apnea, unspecified: Secondary | ICD-10-CM | POA: Insufficient documentation

## 2021-01-13 DIAGNOSIS — Z6837 Body mass index (BMI) 37.0-37.9, adult: Secondary | ICD-10-CM | POA: Insufficient documentation

## 2021-01-13 DIAGNOSIS — Z713 Dietary counseling and surveillance: Secondary | ICD-10-CM | POA: Insufficient documentation

## 2021-01-13 NOTE — Progress Notes (Signed)
Supervised Weight Loss Visit Bariatric Nutrition Education  Planned Surgery: sleeve gastrectomy   Pt Expectation of Surgery/ Goals: greater mobility   3 out of 6 SWL Appointments    NUTRITION ASSESSMENT  Anthropometrics  Start weight at [facility]: 242 lbs (date: 10/16/2020) Today's weight: 246 lbs Weight change: maintained lbs BMI: 37.40 kg/m2    Clinical  Medical hx: Sleep apnea, psoriasis, fibromyalgia  Medications: see list  Labs:  Notable signs/symptoms: chronic pain Any previous deficiencies? Yes: vitamin D  Lifestyle & Dietary Hx  (Previous) Pt states she recognizes she needs to cut back on snacks.  Pt state she knows she wants to comfort herself with food when she is having a fibromyalgia flare. Pt state she does realize she eats out of [employer] and also her sister stressing her out.   Pt state she really believes she is in control of her food choices. Pt states she has had improvements in her timing of exercise.   Estimated daily fluid intake:  oz Supplements:  Current average weekly physical activity: gym 2-3 days a week  24-Hr Dietary Recall First Meal: tortillas + cream cheese + jam or english muffin + cream cheese and jam Snack:  Second Meal: sausage egg and cheese back with croissant rolls + salad or tomatoes or london broil + broccoli + potatoes + bread Snack: grapes Third Meal: hamburger and french fries or pepper steak + salad (some fruit on it) or cake Snack: watermelon or mixed nuts/dates coated in maple or cake Beverages: diet tonic water, 2-3 coffee + half and half  Estimated Energy Needs Calories: 1600   NUTRITION DIAGNOSIS  Overweight/obesity (-3.3) related to past poor dietary habits and physical inactivity as evidenced by patient w/ planned sleeve  surgery following dietary guidelines for continued weight loss.   NUTRITION INTERVENTION  Nutrition counseling (C-1) and education (E-2) to facilitate bariatric surgery goals.  Pre-Op Goals  Progress & New Goals Continue: Add non starchy vegetables to lunch and dinner daily Continue: stop drinking with meals continue: avoid carbonated beverages  continue: work on one snack in the evening  continue: create a chill out/craft space for keeping fun stuff (thinking about it just has not come up with a solution yet) NEW: commit to 3 days a  week with one day day being yoga of activity   Handouts Provided Include    Learning Style & Readiness for Change Teaching method utilized: Visual & Auditory  Demonstrated degree of understanding via: Teach Back  Readiness Level: Action Barriers to learning/adherence to lifestyle change: none identified   RD's Notes for next Visit  Assess pts adherence to chosen goals    MONITORING & EVALUATION Dietary intake, weekly physical activity, body weight, and pre-op goals in 1 month.   Next Steps  Patient is to return to [facility] in 1 month

## 2021-01-15 ENCOUNTER — Telehealth: Payer: Self-pay | Admitting: Family Medicine

## 2021-01-15 DIAGNOSIS — G4733 Obstructive sleep apnea (adult) (pediatric): Secondary | ICD-10-CM | POA: Diagnosis not present

## 2021-01-15 NOTE — Telephone Encounter (Signed)
Please call to clarify the question.   I don't know what she means by " Follow her"

## 2021-01-15 NOTE — Telephone Encounter (Signed)
Pt called to let Dr. Diona Browner know that her insurance is no longer going to cover a nutritionist and wanted to know if she would follow her for her weight loss goals

## 2021-01-15 NOTE — Telephone Encounter (Signed)
Left message for Jillian [name] to return call to clarify what she means about Dr. Diona Browner following her for her weight loss goal.  Need more information on what that entails.

## 2021-01-16 NOTE — Telephone Encounter (Signed)
See MyChart message patient sent in regards to this today 01/16/2021.

## 2021-01-22 NOTE — Progress Notes (Signed)
Office Visit Note  Patient: Jillian [name]             Date of Birth: 1955-10-10           MRN: [id number]             PCP: Jinny Sanders, MD Referring: Jinny Sanders, MD Visit Date: 02/05/2021 Occupation: _0 @  Subjective:  Medication monitoring   History of Present Illness: Jillian [name] is a 65 y.o. female with history of psoriatic arthritis and osteoarthritis.  She is on tremfya 100 mg every 8 weeks. She was started on tremfya on 09/16/20.  She has been tolerating tremfya without any side effects or injection site reactions.  She denies any recent infections.  She states she has noticed about a 40% improvement in her joint pain and inflammation.  She denies any joint swelling at this time.  She has occasional SI joint discomfort.  Her plantar fasciitis has resolved.  She denies any new patches of psoriasis but she continues to have patches on her scalp and palmar aspect of both hands.  She uses betamethasone cream on a daily basis on her palms and lidex solution on her scalp. Overall she feels she is responding well to tremfya and does not want to make any medication changes.   She continues to experience intermittent myalgias and muscle tenderness due to fibromyalgia.  She remains on cymbalta, robaxin, lyrica, and tramadol for symptomatic relief.      Activities of Daily Living:  Patient reports morning stiffness for 1 hour.   Patient Reports nocturnal pain.  Difficulty dressing/grooming: Denies Difficulty climbing stairs: Denies Difficulty getting out of chair: Denies Difficulty using hands for taps, buttons, cutlery, and/or writing: Denies  Review of Systems  Constitutional:  Positive for fatigue.  HENT:  Negative for mouth sores, mouth dryness and nose dryness.   Eyes:  Negative for pain, itching and dryness.  Respiratory:  Negative for shortness of breath and difficulty breathing.   Cardiovascular:  Negative for chest pain and palpitations.  Gastrointestinal:   Negative for blood in stool, constipation and diarrhea.  Endocrine: Negative for increased urination.  Genitourinary:  Negative for difficulty urinating.  Musculoskeletal:  Positive for joint pain, joint pain, myalgias, morning stiffness, muscle tenderness and myalgias. Negative for joint swelling.  Skin:  Positive for rash. Negative for color change.  Allergic/Immunologic: Negative for susceptible to infections.  Neurological:  Negative for dizziness, numbness, headaches and memory loss.  Hematological:  Negative for bruising/bleeding tendency.  Psychiatric/Behavioral:  Negative for confusion.    PMFS History:  Patient Active Problem List   Diagnosis Date Noted   Pitting of nails 01/23/2020   OSA (obstructive sleep apnea) 04/12/2019   S/P left unicompartmental knee replacement 03/21/2019   Osteoarthritis of left knee 03/09/2019   Primary localized osteoarthritis of right knee 02/07/2019   Status post right partial knee replacement 02/07/2019   Weight gain 07/14/2018   Class 2 severe obesity with serious comorbidity and body mass index (BMI) of 37.0 to 37.9 in adult [facility]) 05/13/2018   Prediabetes 12/03/2017   Non-restorative sleep 02/27/2016   Vitamin D deficiency 10/28/2015   Generalized anxiety disorder 11/25/2012   HYPERTRIGLYCERIDEMIA 12/20/2008   MENOPAUSE, EARLY 10/04/2008   Fibromyalgia 10/04/2008    Past Medical History:  Diagnosis Date   Cancer ([place])    basal cell 2016   Dermatitis    per patient    Fibromyalgia    Pre-diabetes    Primary localized osteoarthritis of  right knee 02/07/2019   Sleep apnea    uses CPAP    Family History  Problem Relation Age of Onset   Cancer Father        colon   Hyperlipidemia Father    Hypertension Father    Diabetes Sister    Cancer Paternal Aunt        colon   Cancer Maternal Grandmother        colon   Hypertension Maternal Grandmother    Jillian [name]    Psoriasis Brother    Arthritis Brother     Psoriasis Sister    Osteoarthritis Brother    Diabetes Brother    Throat cancer Brother    Healthy Son    Healthy Daughter    Past Surgical History:  Procedure Laterality Date   CESAREAN SECTION     times 2   EYE SURGERY Bilateral 04/24/2020   bilateral eyelids    HERNIA REPAIR     PARTIAL KNEE ARTHROPLASTY Right 02/07/2019   Procedure: UNICOMPARTMENTAL KNEE;  Surgeon: Marchia Bond, MD;  Location: [facility];  Service: Orthopedics;  Laterality: Right;   PARTIAL KNEE ARTHROPLASTY Left 03/21/2019   Procedure: UNICOMPARTMENTAL KNEE;  Surgeon: Marchia Bond, MD;  Location: [facility];  Service: Orthopedics;  Laterality: Left;   Social History   Social History Narrative   Regular exercise-yes, but seasonal walks and swims   Diet: healthy, low carbohydrates, no caffeine   Immunization History  Administered Date(s) Administered   Influenza-Unspecified 01/01/2020   Moderna Sars-Covid-2 Vaccination 05/23/2019, 07/04/2019   PNEUMOCOCCAL CONJUGATE-20 08/16/2020   Td 03/30/2004   Tdap 10/28/2015   Zoster, Live 08/30/2014     Objective: Vital Signs: BP 120/79 (BP Location: Left Arm, Patient Position: Sitting, Cuff Size: Large)   Pulse 79   Ht _0  (1.727 m)   Wt 247 lb 6.4 oz (112.2 kg)   BMI 37.62 kg/m    Physical Exam Vitals and nursing note reviewed.  Constitutional:      Appearance: She is well-developed.  HENT:     Head: Normocephalic and atraumatic.  Eyes:     Conjunctiva/sclera: Conjunctivae normal.  Pulmonary:     Effort: Pulmonary effort is normal.  Abdominal:     Palpations: Abdomen is soft.  Musculoskeletal:     Cervical back: Normal range of motion.  Skin:    General: Skin is warm and dry.     Capillary Refill: Capillary refill takes less than 2 seconds.     Comments: Fingernail pitting noted.  Small patch of pustular psoriasis on palmar aspect of both hands and on scalp.   Neurological:     Mental Status: She is alert and oriented to person, place, and time.   Psychiatric:        Behavior: Behavior normal.     Musculoskeletal Exam: C-spine, thoracic spine, and lumbar spine have good ROM.  Some SI joint tenderness bilaterally. Shoulder joints, elbow joints, wrist joints, MCPs, PIPs, and DIPs good ROM with no synovitis. PIP and DIP thickening consistent with OA of both hands. Complete fist formation bilaterally.  Hip joints have good ROM with no discomfort.  Knee joints have good ROM with no warmth or effusion.  Ankle joints have good ROM with no tenderness or joint swelling. No evidence of achilles tendonitis or plantar fasciitis.     CDAI Exam: CDAI Score: -- Patient Global: --; Provider Global: -- Swollen: --; Tender: -- Joint Exam 02/05/2021   No joint exam has been documented for  this visit   There is currently no information documented on the homunculus. Go to the Rheumatology activity and complete the homunculus joint exam.  Investigation: No additional findings.  Imaging: DG Bone Density  Result Date: 01/29/2021 EXAM: DUAL X-RAY ABSORPTIOMETRY (DXA) FOR BONE MINERAL DENSITY IMPRESSION: Your patient Jillian [name] completed a FRAX assessment on 01/29/2021 using the [employer] (analysis version: 14.10) manufactured by [employer]. The following summarizes the results of our evaluation. PATIENT BIOGRAPHICAL: Name: Jillian [name], Jillian [name] Patient ID: [id number] Birth Date: 03-02-1956 Height:    68.0 in. Gender:     Female    [age]       Weight:    245.8 lbs. Ethnicity:  White                            Exam Date: 01/29/2021 FRAX* RESULTS:  (version: 3.5) 10-year Probability of Fracture1 Major Osteoporotic Fracture2 Hip Fracture 7.7% 0.6% Population: [place] (Caucasian) Risk Factors: None Based on Femur (Left) Neck BMD 1 -The 10-year probability of fracture may be lower than reported if the patient has received treatment. 2 -Major Osteoporotic Fracture: Clinical Spine, Forearm, Hip or Shoulder *FRAX is a Materials engineer of the [employer]  of [employer] for Metabolic Bone Disease, a [place] (WHO) [facility]. ASSESSMENT: The probability of a major osteoporotic fracture is 7.7% within the next ten years. The probability of a hip fracture is 0.6% within the next ten years. . Your patient Jillian [name] completed a BMD test on 01/29/2021 using the [place] (software version: 14.10) manufactured by [employer]. The following summarizes the results of our evaluation. Technologist: crr PATIENT BIOGRAPHICAL: Name: Jillian [name], Jillian [name] Patient ID: [id number] Birth Date: [birth year] Height: 68.0 in. Gender: Female Exam Date: 01/29/2021 Weight: 245.8 lbs. Indications: Advanced Age, Bilateral Knee Replacements, Caucasian, Early Menopause, hx skin ca, Postmenopausal Fractures: Treatments: Multi-Vitamin DENSITOMETRY RESULTS: Site      Region     Measured Date Measured Age WHO Classification Young Adult T-score BMD         %Change vs. Previous Significant Change (*) AP Spine L1-L3 (L2) 01/29/2020 66.5 Normal -0.1 1.153 g/cm2 - - DualFemur Neck Left 01/29/2021 66.5 Osteopenia -1.1 0.883 g/cm2 - - ASSESSMENT: The BMD measured at Femur Neck Left is 0.883 g/cm2 with a T-score of -1.1. This patient is considered osteopenic according to [facility]) criteria. L-2&4 were excluded due to degenerative changes. The scan quality is limited by exclusion of L-2&4. World Pharmacologist [facility]) criteria for post-menopausal, Caucasian Women: Normal:                   T-score at or above -1 SD Osteopenia/low bone mass: T-score between -1 and -2.5 SD Osteoporosis:             T-score at or below -2.5 SD RECOMMENDATIONS: 1. All patients should optimize calcium and vitamin D intake. 2. Consider FDA-approved medical therapies in postmenopausal women and men aged 66 years and older, based on the following: a. A hip or vertebral(clinical or morphometric) fracture b. T-score < -2.5 at the femoral  neck or spine after appropriate evaluation to exclude secondary causes c. Low bone mass (T-score between -1.0 and -2.5 at the femoral neck or spine) and a 10-year probability of a hip fracture > 3% or a 10-year probability of a major osteoporosis-related fracture >  20% based on the [place]-adapted WHO algorithm 3. Clinician judgment and/or patient preferences may indicate treatment for people with 10-year fracture probabilities above or below these levels FOLLOW-UP: People with diagnosed cases of osteoporosis or at high risk for fracture should have regular bone mineral density tests. For patients eligible for Medicare, routine testing is allowed once every 2 years. The testing frequency can be increased to one year for patients who have rapidly progressing disease, those who are receiving or discontinuing medical therapy to restore bone mass, or have additional risk factors. I have reviewed this report, and agree with the above findings. [facility] Electronically Signed   By: Elmer Picker M.D.   On: 01/29/2021 11:06    Recent Labs: Lab Results  Component Value Date   WBC 5.8 10/15/2020   HGB 13.3 10/15/2020   PLT 215 10/15/2020   NA 141 10/15/2020   K 4.6 10/15/2020   CL 104 10/15/2020   CO2 25 10/15/2020   GLUCOSE 97 10/15/2020   BUN 13 10/15/2020   CREATININE 0.92 10/15/2020   BILITOT 0.3 10/15/2020   ALKPHOS 106 10/15/2020   AST 18 10/15/2020   ALT 29 10/15/2020   PROT 6.2 10/15/2020   ALBUMIN 4.2 10/15/2020   CALCIUM 9.5 10/15/2020   GFRAA 83 07/16/2020   QFTBGOLDPLUS Negative 09/09/2020    Speciality Comments: tremfya -started September 16, 2020  Procedures:  No procedures performed Allergies: Patient has no known allergies.  She is scheduled for an EGD today with Dr. Thermon Leyland.    Assessment / Plan:     Visit Diagnoses: Psoriatic arthritis ([place]) - Active psoriasis, plantar fasciitis, fingernail pitting, RF-, HLA-B27-, chronic SI joint pain, family history of  psoriasis and psoriatic arthritis: She has no synovitis on examination today.  She has noticed about a 40% improvement in her joint pain and inflammation since starting on Tremfya on 09/16/2020.  She has been tolerating Tremfya without any side effects or injection site reactions.  She has not had any recent infections.  Her symptoms of plantar fasciitis have completely resolved.  She continues to experience occasional SI joint discomfort and stiffness but overall her discomfort has been more tolerable and less frequent.  Fingernail pitting is unchanged.  She has a few small patches of pustular psoriasis on the palmar aspect of both hands as well as a few small patches on her scalp.  She has been using betamethasone cream topically on a daily basis as well as Lidex scalp solution as needed.  She has not noticed any new patches of psoriasis since her last office visit.  She does not want to make any medication changes at this time.  She will remain on Tremfya 100 mg subcutaneous injections every 8 weeks.  She was advised to notify us if she develops increased joint pain or joint swelling.  She will follow-up in the office in 3 months.  Psoriasis - Diagnosed by Dr. Venetia Maxon.  She has a few small patches of pustular psoriasis on the palmar aspect of both hands and a few patches on her scalp.  She has been using betamethasone cream topically on a daily basis and lidex scalp solution daily.  She will remain on tremfya as prescribed.   Pitting of nails: Unchanged.  Fingernail pitting noted.   High risk medication use - Tremfya 100 mg/mL sq injections every 8 weeks.  Initiated therapy on 09/16/20. CBC and CMP updated on 10/15/20.  She is overdue to update lab work.  Orders for CBC and  CMP were released today.  Her next lab work will be due in February and every 3 months.  Standing orders for CBC and CMP remain in place.  TB gold negative on 09/09/20 and will continue to be monitored yearly.   - Plan: CBC with  Differential/Platelet, COMPLETE METABOLIC PANEL WITH GFR She has not had any recent infections.  Discussed the importance of holding Tremfya if she develops signs or symptoms of an infection and to resume once the infection has completely cleared.  She voiced understanding.  Primary osteoarthritis of both hands - Radiographic findings on 04/08/2020 were consistent with osteoarthritic changes.  She has PIP and DIP thickening consistent with osteoarthritis of both hands.  No tenderness or inflammation was noted.  Complete fist formation bilaterally.  Primary osteoarthritis of both feet - Radiographic findings from 04/08/2020 were consistent with osteoarthritic changes in both feet.  She has not experiencing any discomfort in her feet at this time.  She is good range of motion of both ankle joints with no tenderness or inflammation.  No evidence of Achilles sinus or plantar fasciitis.  Discussed the importance of wearing proper fitting shoes.  Status post right partial knee replacement - Dr. Silva Bandy on 02/07/2019.  Doing well.  She has good ROM with no discomfort.  She continues to follow up with Dr. Mardelle Matte on a yearly basis.    S/P left unicompartmental knee replacement - Dr. Silva Bandy on 03/21/2019.  Doing well.  She has good ROM with no discomfort.  No warmth or effusion noted.    Chronic SI joint pain - X-rays of the pelvis were unremarkable on 04/08/2020.  She has occasional SI joint pain bilaterally. She has noticed improvement in her severity and frequent of pain and stiffness.  She has mild tenderness over both SI joints on examination today. She will remain on tremfya as prescribed.   Family history of psoriatic arthritis - Sister and brother   Fibromyalgia: She experiences intermittent myalgias and muscle tenderness due to fibromyalgia.  She has been experiencing less frequent flares.  She takes Lyrica 150 mg twice daily, methocarbamol 500 mg 3 times daily as needed for muscle  spasms, Cymbalta 3 capsules by mouth daily, and tramadol 50 mg 2 tablets 3 times daily for pain relief.  She has found the current treatment regimen to be effective at managing her fibromyalgia.  Discussed the importance of regular exercise and good sleep hygiene.  Other medical conditions are listed as follows:   OSA (obstructive sleep apnea)  Prediabetes  Generalized anxiety disorder  Vitamin D deficiency: She is taking vitamin D 50,000 units once weekly.   Non-restorative sleep  Orders: Orders Placed This Encounter  Procedures   CBC with Differential/Platelet   COMPLETE METABOLIC PANEL WITH GFR   No orders of the defined types were placed in this encounter.     Follow-Up Instructions: Return in about 3 months (around 05/08/2021) for Psoriatic arthritis, Osteoarthritis.   Ofilia Neas, PA-C  Note - This record has been created using Dragon software.  Chart creation errors have been sought, but may not always  have been located. Such creation errors do not reflect on  the standard of medical care.

## 2021-01-27 ENCOUNTER — Encounter (HOSPITAL_COMMUNITY): Payer: Self-pay | Admitting: Surgery

## 2021-01-27 ENCOUNTER — Other Ambulatory Visit: Payer: Self-pay | Admitting: Physician Assistant

## 2021-01-27 ENCOUNTER — Other Ambulatory Visit (HOSPITAL_COMMUNITY): Payer: Self-pay

## 2021-01-27 DIAGNOSIS — L4050 Arthropathic psoriasis, unspecified: Secondary | ICD-10-CM

## 2021-01-27 DIAGNOSIS — L409 Psoriasis, unspecified: Secondary | ICD-10-CM

## 2021-01-27 MED ORDER — TREMFYA 100 MG/ML ~~LOC~~ SOAJ
100.0000 mg | SUBCUTANEOUS | 0 refills | Status: DC
Start: 2021-01-27 — End: 2021-03-20
  Filled 2021-01-27: qty 1, 56d supply, fill #0

## 2021-01-27 NOTE — Progress Notes (Signed)
Attempted to obtain medical history via telephone, unable to reach at this time. I left a voicemail to return pre surgical testing department's phone call.  

## 2021-01-27 NOTE — Telephone Encounter (Signed)
Next Visit: 02/05/2021  Last Visit: 11/04/2020  Last Fill: 09/17/2020  FP:[id number] arthritis   Current Dose per office note 11/04/2020: Tremfya 100 mg/mL subcu every 8 weeks.   Labs: 10/15/2020 CBC and CMP WNL  TB Gold: 09/09/2020 Neg   Patient to update labs at upcoming appointment on 02/05/2021   Okay to refill Tremfya?

## 2021-01-29 ENCOUNTER — Other Ambulatory Visit (HOSPITAL_COMMUNITY): Payer: Self-pay

## 2021-01-29 ENCOUNTER — Ambulatory Visit
Admission: RE | Admit: 2021-01-29 | Discharge: 2021-01-29 | Disposition: A | Discharge status: Home / Self Care | Payer: Medicare Other | Source: Ambulatory Visit | Attending: Family Medicine | Admitting: Family Medicine

## 2021-01-29 ENCOUNTER — Other Ambulatory Visit: Payer: Self-pay

## 2021-01-29 DIAGNOSIS — E2839 Other primary ovarian failure: Secondary | ICD-10-CM | POA: Diagnosis not present

## 2021-01-29 DIAGNOSIS — M85852 Other specified disorders of bone density and structure, left thigh: Secondary | ICD-10-CM | POA: Diagnosis not present

## 2021-01-30 ENCOUNTER — Telehealth: Payer: Self-pay

## 2021-01-30 NOTE — Telephone Encounter (Signed)
Patient advised she was due for labs the middle of October. Patient advised we can update her labs at her upcoming appointment on 02/05/2021. Patient advised she will need to continue to have her labs every 3 months. Patient expressed understanding.

## 2021-01-30 NOTE — Telephone Encounter (Signed)
Patient called requesting a return call to let her know if she is due for labwork.

## 2021-02-05 ENCOUNTER — Other Ambulatory Visit: Payer: Self-pay

## 2021-02-05 ENCOUNTER — Ambulatory Visit (HOSPITAL_COMMUNITY)
Admission: RE | Admit: 2021-02-05 | Discharge: 2021-02-05 | Disposition: A | Discharge status: Home / Self Care | Payer: Medicare Other | Source: Ambulatory Visit | Attending: Surgery | Admitting: Surgery

## 2021-02-05 ENCOUNTER — Ambulatory Visit (HOSPITAL_COMMUNITY): Payer: Medicare Other | Admitting: Certified Registered Nurse Anesthetist

## 2021-02-05 ENCOUNTER — Encounter (HOSPITAL_COMMUNITY): Payer: Self-pay | Admitting: Surgery

## 2021-02-05 ENCOUNTER — Encounter (HOSPITAL_COMMUNITY)
Admission: RE | Disposition: A | Discharge status: Home / Self Care | Payer: Self-pay | Source: Ambulatory Visit | Attending: Surgery

## 2021-02-05 ENCOUNTER — Ambulatory Visit: Payer: Medicare Other | Admitting: Physician Assistant

## 2021-02-05 ENCOUNTER — Encounter: Payer: Self-pay | Admitting: Physician Assistant

## 2021-02-05 VITALS — BP 120/79 | HR 79 | Ht 68.0 in | Wt 247.4 lb

## 2021-02-05 DIAGNOSIS — M19041 Primary osteoarthritis, right hand: Secondary | ICD-10-CM

## 2021-02-05 DIAGNOSIS — G4733 Obstructive sleep apnea (adult) (pediatric): Secondary | ICD-10-CM | POA: Diagnosis not present

## 2021-02-05 DIAGNOSIS — M19042 Primary osteoarthritis, left hand: Secondary | ICD-10-CM

## 2021-02-05 DIAGNOSIS — G478 Other sleep disorders: Secondary | ICD-10-CM

## 2021-02-05 DIAGNOSIS — Z79899 Other long term (current) drug therapy: Secondary | ICD-10-CM

## 2021-02-05 DIAGNOSIS — L608 Other nail disorders: Secondary | ICD-10-CM | POA: Diagnosis not present

## 2021-02-05 DIAGNOSIS — E559 Vitamin D deficiency, unspecified: Secondary | ICD-10-CM

## 2021-02-05 DIAGNOSIS — M19071 Primary osteoarthritis, right ankle and foot: Secondary | ICD-10-CM | POA: Diagnosis not present

## 2021-02-05 DIAGNOSIS — Z840 Family history of diseases of the skin and subcutaneous tissue: Secondary | ICD-10-CM

## 2021-02-05 DIAGNOSIS — F411 Generalized anxiety disorder: Secondary | ICD-10-CM

## 2021-02-05 DIAGNOSIS — M533 Sacrococcygeal disorders, not elsewhere classified: Secondary | ICD-10-CM

## 2021-02-05 DIAGNOSIS — Z6837 Body mass index (BMI) 37.0-37.9, adult: Secondary | ICD-10-CM | POA: Diagnosis not present

## 2021-02-05 DIAGNOSIS — Z96652 Presence of left artificial knee joint: Secondary | ICD-10-CM

## 2021-02-05 DIAGNOSIS — L4050 Arthropathic psoriasis, unspecified: Secondary | ICD-10-CM

## 2021-02-05 DIAGNOSIS — R7303 Prediabetes: Secondary | ICD-10-CM

## 2021-02-05 DIAGNOSIS — Z9989 Dependence on other enabling machines and devices: Secondary | ICD-10-CM | POA: Diagnosis not present

## 2021-02-05 DIAGNOSIS — K2289 Other specified disease of esophagus: Secondary | ICD-10-CM | POA: Insufficient documentation

## 2021-02-05 DIAGNOSIS — K2950 Unspecified chronic gastritis without bleeding: Secondary | ICD-10-CM | POA: Insufficient documentation

## 2021-02-05 DIAGNOSIS — Z96651 Presence of right artificial knee joint: Secondary | ICD-10-CM | POA: Diagnosis not present

## 2021-02-05 DIAGNOSIS — M19072 Primary osteoarthritis, left ankle and foot: Secondary | ICD-10-CM

## 2021-02-05 DIAGNOSIS — M797 Fibromyalgia: Secondary | ICD-10-CM

## 2021-02-05 DIAGNOSIS — L409 Psoriasis, unspecified: Secondary | ICD-10-CM

## 2021-02-05 DIAGNOSIS — G8929 Other chronic pain: Secondary | ICD-10-CM

## 2021-02-05 DIAGNOSIS — Z87891 Personal history of nicotine dependence: Secondary | ICD-10-CM | POA: Insufficient documentation

## 2021-02-05 HISTORY — PX: BIOPSY: SHX5522

## 2021-02-05 HISTORY — PX: ESOPHAGOGASTRODUODENOSCOPY: SHX5428

## 2021-02-05 SURGERY — EGD (ESOPHAGOGASTRODUODENOSCOPY)
Anesthesia: Monitor Anesthesia Care

## 2021-02-05 MED ORDER — LIDOCAINE 2% (20 MG/ML) 5 ML SYRINGE
INTRAMUSCULAR | Status: DC | PRN
  Administered 2021-02-05: 80 mg via INTRAVENOUS

## 2021-02-05 MED ORDER — LACTATED RINGERS IV SOLN: INTRAVENOUS | Status: DC

## 2021-02-05 MED ORDER — PROPOFOL 500 MG/50ML IV EMUL
INTRAVENOUS | Status: DC | PRN
  Administered 2021-02-05: 150 ug/kg/min via INTRAVENOUS

## 2021-02-05 MED ORDER — PROPOFOL 10 MG/ML IV BOLUS
INTRAVENOUS | Status: DC | PRN
  Administered 2021-02-05: 50 mg via INTRAVENOUS
  Administered 2021-02-05: 30 mg via INTRAVENOUS

## 2021-02-05 MED ORDER — LACTATED RINGERS IV SOLN
INTRAVENOUS | Status: AC | PRN
  Administered 2021-02-05: 1000 mL via INTRAVENOUS

## 2021-02-05 NOTE — Anesthesia Postprocedure Evaluation (Signed)
Anesthesia Post Note  Patient: Jillian [name]  Procedure(s) Performed: ESOPHAGOGASTRODUODENOSCOPY (EGD) BIOPSY     Patient location during evaluation: PACU Anesthesia Type: MAC Level of consciousness: awake and alert Pain management: pain level controlled Vital Signs Assessment: post-procedure vital signs reviewed and stable Respiratory status: spontaneous breathing, nonlabored ventilation, respiratory function stable and patient connected to nasal cannula oxygen Cardiovascular status: stable and blood pressure returned to baseline Postop Assessment: no apparent nausea or vomiting Anesthetic complications: no   No notable events documented.  Last Vitals:  Vitals:   02/05/21 1350 02/05/21 1355  BP: (!) 152/71   Pulse: 81 81  Resp: 14 15  Temp:    SpO2: 94% 94%    Last Pain:  Vitals:   02/05/21 1350  TempSrc:   PainSc: 0-No pain                 Quetzal Meany S

## 2021-02-05 NOTE — Patient Instructions (Signed)
Standing Labs We placed an order today for your standing lab work.   Please have your standing labs drawn in February and every 3 months   If possible, please have your labs drawn 2 weeks prior to your appointment so that the provider can discuss your results at your appointment.  Please note that you may see your imaging and lab results in MyChart before we have reviewed them. We may be awaiting multiple results to interpret others before contacting you. Please allow our office up to 72 hours to thoroughly review all of the results before contacting the office for clarification of your results.  We have open lab daily: Monday through Thursday from 1:30-4:30 PM and Friday from 1:30-4:00 PM at the office of Dr. Shaili Deveshwar, [facility].   Please be advised, all patients with office appointments requiring lab work will take precedent over walk-in lab work.  If possible, please come for your lab work on Monday and Friday afternoons, as you may experience shorter wait times. The office is located at [address], [address], [place], [place] [address] No appointment is necessary.   Labs are drawn by [facility]. Please bring your co-pay at the time of your lab draw.  You may receive a bill from [facility] for your lab work.  If you wish to have your labs drawn at another location, please call the office 24 hours in advance to send orders.  If you have any questions regarding directions or hours of operation,  please call [phone].   As a reminder, please drink plenty of water prior to coming for your lab work. Thanks!  

## 2021-02-05 NOTE — H&P (Signed)
Admitting Physician: Nickola Major Vincenzo Stave  Service: General Surgery  CC: Obesity  Subjective   HPI: SHARLEY KEELER is an 65 y.o. female who is here for elective EGD prior to bariatric surgery.  Past Medical History:  Diagnosis Date   Cancer ([place])    basal cell 2016   Dermatitis    per patient    Fibromyalgia    Pre-diabetes    Primary localized osteoarthritis of right knee 02/07/2019   Sleep apnea    uses CPAP    Past Surgical History:  Procedure Laterality Date   CESAREAN SECTION     times 2   EYE SURGERY Bilateral 04/24/2020   bilateral eyelids    HERNIA REPAIR     PARTIAL KNEE ARTHROPLASTY Right 02/07/2019   Procedure: UNICOMPARTMENTAL KNEE;  Surgeon: Marchia Bond, MD;  Location: [facility];  Service: Orthopedics;  Laterality: Right;   PARTIAL KNEE ARTHROPLASTY Left 03/21/2019   Procedure: UNICOMPARTMENTAL KNEE;  Surgeon: Marchia Bond, MD;  Location: [facility];  Service: Orthopedics;  Laterality: Left;    Family History  Problem Relation Age of Onset   Cancer Father        colon   Hyperlipidemia Father    Hypertension Father    Diabetes Sister    Cancer Paternal Aunt        colon   Cancer Maternal Grandmother        colon   Hypertension Maternal Grandmother    Anuerysm Paternal Grandfather    Psoriasis Brother    Arthritis Brother    Psoriasis Sister    Osteoarthritis Brother    Diabetes Brother    Throat cancer Brother    Healthy Son    Healthy Daughter     Social:  reports that she quit smoking about 28 years ago. Her smoking use included cigarettes. She has a 15.00 pack-year smoking history. She has never used smokeless tobacco. She reports that she does not drink alcohol and does not use drugs.  Allergies: No Known Allergies  Medications: Current Outpatient Medications  Medication Instructions   augmented betamethasone dipropionate (DIPROLENE-AF) 0.05 % ointment Topical, As needed   buPROPion (WELLBUTRIN XL) 150 MG 24 hr tablet TAKE 1 TABLET  BY MOUTH  DAILY   Co Q 10 100 mg, Oral, Daily   DULoxetine (CYMBALTA) 30 MG capsule TAKE 3 CAPSULES BY MOUTH  DAILY   fish oil-omega-3 fatty acids 4,000 mg, Daily   fluocinonide (LIDEX) 0.05 % external solution 1 application, Topical, 2 times daily PRN   Magnesium Malate 1250 (141.7 Mg) MG TABS 1 tablet, Oral, 3 times daily   methocarbamol (ROBAXIN) 500 MG tablet TAKE 1 TABLET BY MOUTH 3  TIMES DAILY AS NEEDED FOR  MUSCLE SPASMS   pregabalin (LYRICA) 150 MG capsule TAKE 1 CAPSULE BY MOUTH  TWICE DAILY   traMADol (ULTRAM) 50 MG tablet TAKE 2 TABLETS BY MOUTH 3  TIMES DAILY   Tremfya 100 mg, Subcutaneous, Every 8 weeks   Vitamin D, Ergocalciferol, (DRISDOL) 1.25 MG (50000 UNIT) CAPS capsule TAKE 1 CAPSULE BY MOUTH  EVERY 7 DAYS    ROS - all of the below systems have been reviewed with the patient and positives are indicated with bold text General: chills, fever or night sweats Eyes: blurry vision or double vision ENT: epistaxis or sore throat Allergy/Immunology: itchy/watery eyes or nasal congestion Hematologic/Lymphatic: bleeding problems, blood clots or swollen lymph nodes Endocrine: temperature intolerance or unexpected weight changes Breast: new or changing breast lumps or nipple discharge  Resp: cough, shortness of breath, or wheezing CV: chest pain or dyspnea on exertion GI: as per HPI GU: dysuria, trouble voiding, or hematuria MSK: joint pain or joint stiffness Neuro: TIA or stroke symptoms Derm: pruritus and skin lesion changes Psych: anxiety and depression  Objective   PE There were no vitals taken for this visit. Constitutional: NAD; conversant; no deformities Eyes: Moist conjunctiva; no lid lag; anicteric; PERRL Neck: Trachea midline; no thyromegaly Lungs: Normal respiratory effort; no tactile fremitus CV: RRR; no palpable thrills; no pitting edema GI: Abd Soft, non-tender; no palpable hepatosplenomegaly MSK: Normal range of motion of extremities; no  clubbing/cyanosis Psychiatric: Appropriate affect; alert and oriented x3 Lymphatic: No palpable cervical or axillary lymphadenopathy  No results found for this or any previous visit (from the past 24 hour(s)).  Imaging Orders  No imaging studies ordered today     Assessment and Plan   Ms. Nova is a 65 year old female who presents for bariatric surgery consultation. I reviewed the metabolic surgeries we offer here including a robotic sleeve gastrectomy and robotic gastric bypass. We reviewed in detail the procedures themselves as well as the risks, benefits, and alternatives. I entered her information into the [facility] metabolic surgery risk/benefit calculator to aid in this discussion. After full discussion the patient is interested in proceeding with sleeve gastrectomy. We will begin the preoperative pathway to include the following - Bloodwork - Dietitian consult - Completing with Sandie Ano, RD - Psychology consult - Chest x-ray 09/09/20 - No active cardiopulmonary disease - EKG 09/09/20 - normal NSR  Upper endoscopy - I explained my reasoning for performing upper endoscopies in my patient's preoperatively as well as the risks, benefits, and alternatives of surgery. After full discussion all questions answered patient granted consent to proceed. We will proceed with upper endoscopy today.   Felicie Morn, MD  [facility] Use [email or web address] to contact on call provider

## 2021-02-05 NOTE — Transfer of Care (Signed)
Immediate Anesthesia Transfer of Care Note  Patient: Jillian [name]  Procedure(s) Performed: ESOPHAGOGASTRODUODENOSCOPY (EGD) BIOPSY  Patient Location: PACU and Endoscopy Unit  Anesthesia Type:MAC  Level of Consciousness: awake, alert  and oriented  Airway & Oxygen Therapy: Patient Spontanous Breathing and Patient connected to face mask  Post-op Assessment: Report given to RN and Post -op Vital signs reviewed and stable  Post vital signs: Reviewed and stable  Last Vitals:  Vitals Value Taken Time  BP    Temp    Pulse 86 02/05/21 1338  Resp 18 02/05/21 1338  SpO2 98 % 02/05/21 1338  Vitals shown include unvalidated device data.  Last Pain:  Vitals:   02/05/21 1226  TempSrc: Oral  PainSc: 0-No pain         Complications: No notable events documented.

## 2021-02-05 NOTE — Anesthesia Preprocedure Evaluation (Signed)
Anesthesia Evaluation  Patient identified by MRN, date of birth, ID band Patient awake    Reviewed: Allergy & Precautions, NPO status , Patient's Chart, lab work & pertinent test results  Airway Mallampati: II  TM Distance: >3 FB Neck ROM: Full    Dental no notable dental hx.    Pulmonary sleep apnea , former smoker,    Pulmonary exam normal breath sounds clear to auscultation       Cardiovascular negative cardio ROS Normal cardiovascular exam Rhythm:Regular Rate:Normal     Neuro/Psych negative neurological ROS  negative psych ROS   GI/Hepatic negative GI ROS, Neg liver ROS,   Endo/Other  obesity  Renal/GU negative Renal ROS  negative genitourinary   Musculoskeletal  (+) Arthritis , Fibromyalgia -  Abdominal   Peds negative pediatric ROS (+)  Hematology negative hematology ROS (+)   Anesthesia Other Findings   Reproductive/Obstetrics negative OB ROS                             Anesthesia Physical Anesthesia Plan  ASA: 2  Anesthesia Plan: MAC   Post-op Pain Management:    Induction: Intravenous  PONV Risk Score and Plan: 2 and Propofol infusion and Treatment may vary due to age or medical condition  Airway Management Planned: Simple Face Mask  Additional Equipment:   Intra-op Plan:   Post-operative Plan:   Informed Consent: I have reviewed the patients History and Physical, chart, labs and discussed the procedure including the risks, benefits and alternatives for the proposed anesthesia with the patient or authorized representative who has indicated his/her understanding and acceptance.     Dental advisory given  Plan Discussed with: CRNA and Surgeon  Anesthesia Plan Comments:         Anesthesia Quick Evaluation

## 2021-02-05 NOTE — Op Note (Signed)
[facility] Patient Name: Jillian [name] Procedure Date: 02/05/2021 MRN: [id number] Attending MD: Felicie Morn ,  Date of Birth: 05-Dec-1955 CSN: [id number] Age: 65 Admit Type: Outpatient Procedure:                Upper GI endoscopy Indications:              Morbid obesity Providers:                Felicie Morn, Particia Nearing, RN, Frazier Richards, Technician Referring MD:              Medicines:                Monitored Anesthesia Care Complications:            No immediate complications. Estimated blood loss:                            None. Estimated Blood Loss:     Estimated blood loss was minimal. Procedure:                Pre-Anesthesia Assessment:                           - Prior to the procedure, a History and Physical                            was performed, and patient medications and                            allergies were reviewed. The patient is competent.                            The risks and benefits of the procedure and the                            sedation options and risks were discussed with the                            patient. All questions were answered and informed                            consent was obtained. Patient identification and                            proposed procedure were verified by the physician                            in the pre-procedure area. Mental Status                            Examination: alert and oriented. Airway                            Examination: normal oropharyngeal airway and neck  mobility. Respiratory Examination: clear to                            auscultation. CV Examination: normal. ASA Grade                            Assessment: II - A patient with mild systemic                            disease. After reviewing the risks and benefits,                            the patient was deemed in satisfactory condition to                             undergo the procedure. The anesthesia plan was to                            use monitored anesthesia care (MAC). Immediately                            prior to administration of medications, the patient                            was re-assessed for adequacy to receive sedatives.                            The heart rate, respiratory rate, oxygen                            saturations, blood pressure, adequacy of pulmonary                            ventilation, and response to care were monitored                            throughout the procedure. The physical status of                            the patient was re-assessed after the procedure.                           After obtaining informed consent, the endoscope was                            passed under direct vision. Throughout the                            procedure, the patient's blood pressure, pulse, and                            oxygen saturations were monitored continuously. The  [id number]-H190 ([id number]) Olympus endoscope was introduced                            through the mouth, and advanced to the second part                            of duodenum. The upper GI endoscopy was                            accomplished without difficulty. The patient                            tolerated the procedure well. Scope In: Scope Out: Findings:      The examined esophagus was normal.      The gastroesophageal flap valve was visualized endoscopically and       classified as Hill Grade II (fold present, opens with respiration).      The entire examined stomach was normal. Biopsies were taken with a cold       forceps for Helicobacter pylori testing. Verification of patient       identification for the specimen was done. Estimated blood loss was       minimal.      The Z-line was irregular.      The examined duodenum was normal. Impression:               - Normal esophagus.                            - Gastroesophageal flap valve classified as Hill                            Grade II (fold present, opens with respiration).                           - Normal stomach. Biopsied.                           - Z-line irregular.                           - Normal examined duodenum. Moderate Sedation:      Moderate (conscious) sedation was personally administered by an       anesthesia professional. The following parameters were monitored: oxygen       saturation, heart rate, blood pressure, and response to care. Total       physician intraservice time was 15 minutes. Recommendation:           - Discharge patient to home.                           - Resume previous diet.                           - Continue present medications.                           - Await pathology results. Procedure Code(s):        ---  Professional ---                           [id number], Esophagogastroduodenoscopy, flexible,                            transoral; with biopsy, single or multiple Diagnosis Code(s):        --- Professional ---                           E66.01, Morbid (severe) obesity due to excess                            calories                           K22.8, Other specified diseases of esophagus CPT copyright 2019 American Medical Association. All rights reserved. The codes documented in this report are preliminary and upon coder review may  be revised to meet current compliance requirements. [place],  02/05/2021 1:38:44 PM This report has been signed electronically. Number of Addenda: 0

## 2021-02-06 ENCOUNTER — Encounter (HOSPITAL_COMMUNITY): Payer: Self-pay | Admitting: Surgery

## 2021-02-06 LAB — CBC WITH DIFFERENTIAL/PLATELET
Absolute Monocytes: 787 cells/uL (ref 200–950)
Basophils Absolute: 39 cells/uL (ref 0–200)
Basophils Relative: 0.6 %
Eosinophils Absolute: 143 cells/uL (ref 15–500)
Eosinophils Relative: 2.2 %
HCT: 40.2 % (ref 35.0–45.0)
Hemoglobin: 13.6 g/dL (ref 11.7–15.5)
Lymphs Abs: 1690 cells/uL (ref 850–3900)
MCH: 31.5 pg (ref 27.0–33.0)
MCHC: 33.8 g/dL (ref 32.0–36.0)
MCV: 93.1 fL (ref 80.0–100.0)
MPV: 10.7 fL (ref 7.5–12.5)
Monocytes Relative: 12.1 %
Neutro Abs: 3842 cells/uL (ref 1500–7800)
Neutrophils Relative %: 59.1 %
Platelets: 215 10*3/uL (ref 140–400)
RBC: 4.32 10*6/uL (ref 3.80–5.10)
RDW: 12.1 % (ref 11.0–15.0)
Total Lymphocyte: 26 %
WBC: 6.5 10*3/uL (ref 3.8–10.8)

## 2021-02-06 LAB — COMPLETE METABOLIC PANEL WITH GFR
AG Ratio: 2.1 (calc) (ref 1.0–2.5)
ALT: 29 U/L (ref 6–29)
AST: 21 U/L (ref 10–35)
Albumin: 4.2 g/dL (ref 3.6–5.1)
Alkaline phosphatase (APISO): 91 U/L (ref 37–153)
BUN: 12 mg/dL (ref 7–25)
CO2: 24 mmol/L (ref 20–32)
Calcium: 9.4 mg/dL (ref 8.6–10.4)
Chloride: 106 mmol/L (ref 98–110)
Creat: 0.77 mg/dL (ref 0.50–1.05)
Globulin: 2 g/dL (calc) (ref 1.9–3.7)
Glucose, Bld: 101 mg/dL — ABNORMAL HIGH (ref 65–99)
Potassium: 4.5 mmol/L (ref 3.5–5.3)
Sodium: 140 mmol/L (ref 135–146)
Total Bilirubin: 0.4 mg/dL (ref 0.2–1.2)
Total Protein: 6.2 g/dL (ref 6.1–8.1)
eGFR: 85 mL/min/{1.73_m2} (ref >=60)

## 2021-02-06 NOTE — Progress Notes (Signed)
CBC and CMP WNL

## 2021-02-07 LAB — SURGICAL PATHOLOGY

## 2021-02-11 ENCOUNTER — Other Ambulatory Visit: Payer: Self-pay | Admitting: Family Medicine

## 2021-02-12 ENCOUNTER — Other Ambulatory Visit: Payer: Self-pay | Admitting: Family Medicine

## 2021-02-12 NOTE — Telephone Encounter (Signed)
Last office visit 08/16/2020 for [place].  Last refilled 01/07/2021 for #180 with no refills. Next Appt: 02/14/2021 for weight loss.

## 2021-02-13 ENCOUNTER — Encounter: Payer: Medicare Other | Admitting: Skilled Nursing Facility1

## 2021-02-14 ENCOUNTER — Encounter: Payer: Self-pay | Admitting: Family Medicine

## 2021-02-14 ENCOUNTER — Ambulatory Visit (INDEPENDENT_AMBULATORY_CARE_PROVIDER_SITE_OTHER): Payer: Medicare Other | Admitting: Family Medicine

## 2021-02-14 ENCOUNTER — Other Ambulatory Visit: Payer: Self-pay

## 2021-02-14 VITALS — BP 110/70 | HR 84 | Temp 97.7°F | Ht 68.0 in | Wt 245.5 lb

## 2021-02-14 DIAGNOSIS — Z7689 Persons encountering health services in other specified circumstances: Secondary | ICD-10-CM | POA: Insufficient documentation

## 2021-02-14 DIAGNOSIS — M797 Fibromyalgia: Secondary | ICD-10-CM

## 2021-02-14 DIAGNOSIS — Z6837 Body mass index (BMI) 37.0-37.9, adult: Secondary | ICD-10-CM | POA: Diagnosis not present

## 2021-02-14 NOTE — Progress Notes (Signed)
Patient ID: Jillian [name], female    DOB: 07/26/1955, 65 y.o.   MRN: [id number]  This visit was conducted in person.  BP 110/70   Pulse 84   Temp 97.7 F (36.5 C) (Temporal)   Ht 5\' 8"  (1.727 m)   Wt 245 lb 8 oz (111.4 kg)   PF 97 L/min   BMI 37.33 kg/m    CC:  Chief Complaint  Patient presents with   Weight Management   Pain Management    Subjective:   HPI: Jillian [name] is a 65 y.o. female presenting on 02/14/2021 for Weight Management and Pain Management  Weight management: She has struggle with weight issues since age 17. She has been off and on diet since then.  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  She has recently  complete 4 months of nutrition visit.Marland Kitchenshe has started trying to eat smaller, non-starchy foods, high fat foods, sugary drinks.Marland Kitchen learned about upcoming surgery.    In last 2 months.. she has been treadmill 40 min  and yoga 45 min three times a week. She has not been weight her self per recommendations   She has been going to bariatric surgery support group meetings, monthly for last 4 months.   She plans gastric sleeve surgery...  03/2021. Dr.  Lanny Hurst  She has also tried several medication in the past to treat.  Wt Readings from Last 3 Encounters:  02/14/21 245 lb 8 oz (111.4 kg)  02/05/21 247 lb 5.7 oz (112.2 kg)  02/05/21 247 lb 6.4 oz (112.2 kg)   Indication for chronic opioid: fibromyalgia, currently very well controlled  since starting Tremfya.  Plans to cut down on meds after bariatric surgery.  Medication and dose: Now adequate  on cymbalta, wellbutrin, lyrica (three times a day),  Robaxin, tramadol  2 tabs TID and vitamin supplements.  Turmeric. # pills per month: [place] reviewed this encounter (include red flags):   PDMP reviewed during this encounter.        Relevant past medical, surgical, family and social history reviewed and updated  as indicated. Interim medical history since our last visit reviewed. Allergies and medications reviewed and updated. Outpatient Medications Prior to Visit  Medication Sig Dispense Refill   augmented betamethasone dipropionate (DIPROLENE-AF) 0.05 % ointment Apply topically as needed.     buPROPion (WELLBUTRIN XL) 150 MG 24 hr tablet TAKE 1 TABLET BY MOUTH  DAILY 90 tablet 1   Coenzyme Q10 (CO Q 10) 100 MG CAPS Take 100 mg by mouth daily.      DULoxetine (CYMBALTA) 30 MG capsule TAKE 3 CAPSULES BY MOUTH  DAILY 270 capsule 1   fluocinonide (LIDEX) 0.05 % external solution Apply 1 application topically 2 (two) times daily as needed (PSORIASIS).     Guselkumab (TREMFYA) 100 MG/ML SOPN Inject 100 mg into the skin every 8 (eight) weeks. 1 mL 0   Magnesium Malate 1250 (141.7 Mg) MG TABS Take 1 tablet by mouth in the morning, at noon, and at bedtime.     methocarbamol (ROBAXIN) 500 MG tablet TAKE 1 TABLET BY MOUTH 3  TIMES DAILY AS NEEDED FOR  MUSCLE SPASMS 270 tablet 0   pregabalin (LYRICA) 150 MG capsule TAKE 1 CAPSULE BY MOUTH  TWICE DAILY 180 capsule 0   traMADol (ULTRAM) 50 MG tablet TAKE 2 TABLETS BY MOUTH 3  TIMES DAILY 180 tablet 0   Vitamin  D, Ergocalciferol, (DRISDOL) 1.25 MG (50000 UNIT) CAPS capsule TAKE 1 CAPSULE BY MOUTH  EVERY 7 DAYS 13 capsule 3   fish oil-omega-3 fatty acids 1000 MG capsule Take 4,000 mg by mouth daily. (Patient not taking: Reported on 02/05/2021)     No facility-administered medications prior to visit.     Per HPI unless specifically indicated in ROS section below Review of Systems  Constitutional:  Negative for fatigue and fever.  HENT:  Negative for congestion.   Eyes:  Negative for pain.  Respiratory:  Negative for cough and shortness of breath.   Cardiovascular:  Negative for chest pain, palpitations and leg swelling.  Gastrointestinal:  Negative for abdominal pain.  Genitourinary:  Negative for dysuria and vaginal bleeding.  Musculoskeletal:  Negative for  back pain.  Neurological:  Negative for syncope, light-headedness and headaches.  Psychiatric/Behavioral:  Negative for dysphoric mood.   Objective:  BP 110/70   Pulse 84   Temp 97.7 F (36.5 C) (Temporal)   Ht 5\' 8"  (1.727 m)   Wt 245 lb 8 oz (111.4 kg)   PF 97 L/min   BMI 37.33 kg/m   Wt Readings from Last 3 Encounters:  02/14/21 245 lb 8 oz (111.4 kg)  02/05/21 247 lb 5.7 oz (112.2 kg)  02/05/21 247 lb 6.4 oz (112.2 kg)      Physical Exam Constitutional:      General: She is not in acute distress.    Appearance: Normal appearance. She is well-developed. She is obese. She is not ill-appearing or toxic-appearing.  HENT:     Head: Normocephalic.     Right Ear: Hearing, tympanic membrane, ear canal and external ear normal. Tympanic membrane is not erythematous, retracted or bulging.     Left Ear: Hearing, tympanic membrane, ear canal and external ear normal. Tympanic membrane is not erythematous, retracted or bulging.     Nose: No mucosal edema or rhinorrhea.     Right Sinus: No maxillary sinus tenderness or frontal sinus tenderness.     Left Sinus: No maxillary sinus tenderness or frontal sinus tenderness.     Mouth/Throat:     Pharynx: Uvula midline.  Eyes:     General: Lids are normal. Lids are everted, no foreign bodies appreciated.     Conjunctiva/sclera: Conjunctivae normal.     Pupils: Pupils are equal, round, and reactive to light.  Neck:     Thyroid: No thyroid mass or thyromegaly.     Vascular: No carotid bruit.     Trachea: Trachea normal.  Cardiovascular:     Rate and Rhythm: Normal rate and regular rhythm.     Pulses: Normal pulses.     Heart sounds: Normal heart sounds, S1 normal and S2 normal. No murmur heard.   No friction rub. No gallop.  Pulmonary:     Effort: Pulmonary effort is normal. No tachypnea or respiratory distress.     Breath sounds: Normal breath sounds. No decreased breath sounds, wheezing, rhonchi or rales.  Abdominal:     General: Bowel  sounds are normal.     Palpations: Abdomen is soft.     Tenderness: There is no abdominal tenderness.  Musculoskeletal:     Cervical back: Normal range of motion and neck supple.  Skin:    General: Skin is warm and dry.     Findings: No rash.  Neurological:     Mental Status: She is alert.  Psychiatric:        Mood and Affect: Mood is not  anxious or depressed.        Speech: Speech normal.        Behavior: Behavior normal. Behavior is cooperative.        Thought Content: Thought content normal.        Judgment: Judgment normal.      Results for orders placed or performed during the hospital encounter of 02/05/21  Surgical pathology  Result Value Ref Range   SURGICAL PATHOLOGY      SURGICAL PATHOLOGY CASE: [phone] PATIENT: Kathi Ludwig Surgical Pathology Report     Clinical History: GERD, morbid obesity (crm)     FINAL MICROSCOPIC DIAGNOSIS:  A. STOMACH, ANTRUM, BIOPSY: - Chronic active gastritis. - Negative for Helicobacter pylori by immunohistochemistry. - Negative for intestinal metaplasia, dysplasia, and malignancy.  GROSS DESCRIPTION:  Received in formalin is a pink-white soft tissue fragment that is submitted in toto.  Size: 0.25 cm, 1 block submitted.  SW 02/05/2021   Final Diagnosis performed by Betsy Pries, MD.   Electronically signed 02/07/2021 Technical component performed at [facility], [place] [address], [place], [place] [address].  Professional component performed at [facility]. [address], [place], [place] [address]  Immunohistochemistry Technical component (if applicable) was performed at [facility]. [address], [place], [place] [address].  IMMUNOHISTOCHEMISTRY DISCLAIMER (if applicable): Some of these immunohistochemical stains may have been developed and the performance characteristics determine by [facility]. Some may not have been  cleared or approved by the U.S. Food and Drug Administration. The FDA has determined that such clearance or approval is not necessary. This test is used for clinical purposes. It should not be regarded as investigational or for research. This laboratory is certified under the [place] (CLIA-88) as qualified to perform high complexity clinical laboratory testing.  The controls stained appropriately.     This visit occurred during the SARS-CoV-2 public health emergency.  Safety protocols were in place, including screening questions prior to the visit, additional usage of staff PPE, and extensive cleaning of exam room while observing appropriate contact time as indicated for disinfecting solutions.   COVID 19 screen:  No recent travel or known exposure to COVID19 The patient denies respiratory symptoms of COVID 19 at this time. The importance of social distancing was discussed today.   Assessment and Plan    Problem List Items Addressed This Visit     Class 2 severe obesity due to excess calories with serious comorbidity and body mass index (BMI) of 37.0 to 37.9 in adult [facility])   Encounter for weight management - Primary    Weight change since last OV  +10 lbs Nutrition: seeing nutritionist, continuing to work on low carb options  Psychological: Monthly support group meetings  Exercise: 3 times a week treadmill 40 min and yoga 45 min   Medication: Has not been effective in past  Plans: 2 more weight management OVs  For supervision of weight loss      Fibromyalgia    Stable control on current regimen.  tramadol 2 tabs TID.Marland Kitchen plans to wean down after gastric surgery.        Eliezer Lofts, MD

## 2021-02-14 NOTE — Assessment & Plan Note (Signed)
Weight change since last OV  +10 lbs Nutrition: seeing nutritionist, continuing to work on low carb options  Psychological: Monthly support group meetings  Exercise: 3 times a week treadmill 40 min and yoga 45 min   Medication: Has not been effective in past  Plans: 2 more weight management OVs  For supervision of weight loss

## 2021-02-14 NOTE — Assessment & Plan Note (Signed)
Stable control on current regimen.  tramadol 2 tabs TID.Marland Kitchen plans to wean down after gastric surgery.

## 2021-02-18 ENCOUNTER — Ambulatory Visit: Payer: Medicare Other | Admitting: Family Medicine

## 2021-02-26 ENCOUNTER — Telehealth: Payer: Self-pay | Admitting: Pharmacist

## 2021-02-26 NOTE — Telephone Encounter (Signed)
Upon review of [email or web address], the PAP application is now available. Mailed patient form to patient with letter stating that proof of income and OOP rx costs from pharmacy is required. Advised that if she plans to return to [facility] directly, she should notify our clinic so we can submit Hazel Sams, PA-C, form.   Provider form placed in provider folder to be signed.   Fax: [phone] Phone: [phone]   Knox Saliva, PharmD, MPH, BCPS Clinical Pharmacist (Rheumatology and Pulmonology)

## 2021-02-27 NOTE — Telephone Encounter (Signed)
Signed provider form for [employer] PAP application received for Tremfya. Will file with insurance card copy and med list into "PAP pending info" folder in pharmacy office.  Patient forms were mailed to patient's home yesterday, 02/26/21.  Knox Saliva, PharmD, MPH, BCPS Clinical Pharmacist (Rheumatology and Pulmonology)

## 2021-03-10 ENCOUNTER — Other Ambulatory Visit: Payer: Self-pay | Admitting: Family Medicine

## 2021-03-11 NOTE — Telephone Encounter (Signed)
Last office visit 02/14/2021 for weight/pain management.  Last refilled 02/12/2021 for #180 with no refills.  Next Appt: 03/14/2021 for weight loss management.

## 2021-03-13 ENCOUNTER — Ambulatory Visit: Payer: Medicare Other | Admitting: Skilled Nursing Facility1

## 2021-03-13 NOTE — Telephone Encounter (Signed)
Called patient regarding Tremfya JJPA[employer] application. She states it is on her to-do list for today. Advised patient to notify us if she plans to submit her part of application to company herself so that we can submit provider portion on time.  Knox Saliva, PharmD, MPH, BCPS Clinical Pharmacist (Rheumatology and Pulmonology)

## 2021-03-14 ENCOUNTER — Other Ambulatory Visit: Payer: Self-pay

## 2021-03-14 ENCOUNTER — Telehealth: Payer: Self-pay | Admitting: Pharmacy Technician

## 2021-03-14 ENCOUNTER — Ambulatory Visit (INDEPENDENT_AMBULATORY_CARE_PROVIDER_SITE_OTHER): Payer: Medicare Other | Admitting: Family Medicine

## 2021-03-14 VITALS — BP 120/72 | HR 81 | Temp 97.8°F | Resp 16 | Ht 68.0 in | Wt 249.0 lb

## 2021-03-14 DIAGNOSIS — Z7689 Persons encountering health services in other specified circumstances: Secondary | ICD-10-CM

## 2021-03-14 DIAGNOSIS — Z6837 Body mass index (BMI) 37.0-37.9, adult: Secondary | ICD-10-CM

## 2021-03-14 NOTE — Progress Notes (Signed)
Patient ID: Jillian [name], female    DOB: December 27, 1955, 65 y.o.   MRN: [id number]  This visit was conducted in person.  BP 120/72    Pulse 81    Temp 97.8 F (36.6 C)    Resp 16    Ht 5\' 8"  (1.727 m)    Wt 249 lb (112.9 kg)    SpO2 95%    BMI 37.86 kg/m    CC: Chief Complaint  Patient presents with   Weight Check    Subjective:   HPI: Jillian [name] is a 65 y.o. female presenting on 03/14/2021 for Weight Check  Monthly weight assessment and visit leading up to bariatric surgery. She plans gastric sleeve surgery...  03/2021. Dr.  Lanny Hurst  She has been working on :treadmill 40 min  and yoga 45 min three times a week. She has been seeing a nutritionist.. eating low carb diet... has had some struggles doing that given  she doesn't feel well on low carbs... blood sugar sometimes drops low.     She is going to monthly weight loss support group meetings   Goals for next month: She is planning on chewing food more slowly and making a meal last 20 min.  Wt Readings from Last 3 Encounters:  03/14/21 249 lb (112.9 kg)  02/14/21 245 lb 8 oz (111.4 kg)  02/05/21 247 lb 5.7 oz (112.2 kg)   Body mass index is 37.86 kg/m.       Relevant past medical, surgical, family and social history reviewed and updated as indicated. Interim medical history since our last visit reviewed. Allergies and medications reviewed and updated. Outpatient Medications Prior to Visit  Medication Sig Dispense Refill   augmented betamethasone dipropionate (DIPROLENE-AF) 0.05 % ointment Apply topically as needed.     buPROPion (WELLBUTRIN XL) 150 MG 24 hr tablet TAKE 1 TABLET BY MOUTH  DAILY 90 tablet 1   Coenzyme Q10 (CO Q 10) 100 MG CAPS Take 100 mg by mouth daily.      DULoxetine (CYMBALTA) 30 MG capsule TAKE 3 CAPSULES BY MOUTH  DAILY 270 capsule 1   fluocinonide (LIDEX) 0.05 % external solution Apply 1 application topically 2 (two) times daily as needed (PSORIASIS).     Guselkumab (TREMFYA) 100  MG/ML SOPN Inject 100 mg into the skin every 8 (eight) weeks. 1 mL 0   Magnesium Malate 1250 (141.7 Mg) MG TABS Take 1 tablet by mouth in the morning, at noon, and at bedtime.     methocarbamol (ROBAXIN) 500 MG tablet TAKE 1 TABLET BY MOUTH 3  TIMES DAILY AS NEEDED FOR  MUSCLE SPASMS 270 tablet 0   pregabalin (LYRICA) 150 MG capsule TAKE 1 CAPSULE BY MOUTH  TWICE DAILY 180 capsule 0   traMADol (ULTRAM) 50 MG tablet TAKE 2 TABLETS BY MOUTH 3 TIMES  DAILY 180 tablet 0   Vitamin D, Ergocalciferol, (DRISDOL) 1.25 MG (50000 UNIT) CAPS capsule TAKE 1 CAPSULE BY MOUTH  EVERY 7 DAYS 13 capsule 3   No facility-administered medications prior to visit.     Per HPI unless specifically indicated in ROS section below Review of Systems  Constitutional:  Negative for fatigue and fever.  HENT:  Negative for congestion.   Eyes:  Negative for pain.  Respiratory:  Negative for cough and shortness of breath.   Cardiovascular:  Negative for chest pain, palpitations and leg swelling.  Gastrointestinal:  Negative for abdominal pain.  Genitourinary:  Negative for dysuria and vaginal bleeding.  Musculoskeletal:  Negative for back pain.  Neurological:  Negative for syncope, light-headedness and headaches.  Psychiatric/Behavioral:  Negative for dysphoric mood.   Objective:  BP 120/72    Pulse 81    Temp 97.8 F (36.6 C)    Resp 16    Ht 5\' 8"  (1.727 m)    Wt 249 lb (112.9 kg)    SpO2 95%    BMI 37.86 kg/m   Wt Readings from Last 3 Encounters:  03/14/21 249 lb (112.9 kg)  02/14/21 245 lb 8 oz (111.4 kg)  02/05/21 247 lb 5.7 oz (112.2 kg)      Physical Exam Constitutional:      General: She is not in acute distress.    Appearance: Normal appearance. She is well-developed. She is not ill-appearing or toxic-appearing.  HENT:     Head: Normocephalic.     Right Ear: Hearing, tympanic membrane, ear canal and external ear normal. Tympanic membrane is not erythematous, retracted or bulging.     Left Ear: Hearing,  tympanic membrane, ear canal and external ear normal. Tympanic membrane is not erythematous, retracted or bulging.     Nose: No mucosal edema or rhinorrhea.     Right Sinus: No maxillary sinus tenderness or frontal sinus tenderness.     Left Sinus: No maxillary sinus tenderness or frontal sinus tenderness.     Mouth/Throat:     Pharynx: Uvula midline.  Eyes:     General: Lids are normal. Lids are everted, no foreign bodies appreciated.     Conjunctiva/sclera: Conjunctivae normal.     Pupils: Pupils are equal, round, and reactive to light.  Neck:     Thyroid: No thyroid mass or thyromegaly.     Vascular: No carotid bruit.     Trachea: Trachea normal.  Cardiovascular:     Rate and Rhythm: Normal rate and regular rhythm.     Pulses: Normal pulses.     Heart sounds: Normal heart sounds, S1 normal and S2 normal. No murmur heard.   No friction rub. No gallop.  Pulmonary:     Effort: Pulmonary effort is normal. No tachypnea or respiratory distress.     Breath sounds: Normal breath sounds. No decreased breath sounds, wheezing, rhonchi or rales.  Abdominal:     General: Bowel sounds are normal.     Palpations: Abdomen is soft.     Tenderness: There is no abdominal tenderness.  Musculoskeletal:     Cervical back: Normal range of motion and neck supple.  Skin:    General: Skin is warm and dry.     Findings: No rash.  Neurological:     Mental Status: She is alert.  Psychiatric:        Mood and Affect: Mood is not anxious or depressed.        Speech: Speech normal.        Behavior: Behavior normal. Behavior is cooperative.        Thought Content: Thought content normal.        Judgment: Judgment normal.      Results for orders placed or performed during the hospital encounter of 02/05/21  Surgical pathology  Result Value Ref Range   SURGICAL PATHOLOGY      SURGICAL PATHOLOGY CASE: [id number] PATIENT: [facility] Surgical Pathology Report     Clinical History: GERD, morbid  obesity (crm)     FINAL MICROSCOPIC DIAGNOSIS:  A. STOMACH, ANTRUM, BIOPSY: - Chronic active gastritis. - Negative for Helicobacter pylori by immunohistochemistry. -  Negative for intestinal metaplasia, dysplasia, and malignancy.  GROSS DESCRIPTION:  Received in formalin is a pink-white soft tissue fragment that is submitted in toto.  Size: 0.25 cm, 1 block submitted.  SW 02/05/2021   Final Diagnosis performed by Betsy Pries, MD.   Electronically signed 02/07/2021 Technical component performed at [facility], [place] [address] [place], [place] [address].  Professional component performed at [facility]. [address] [place], [place] [address]  Immunohistochemistry Technical component (if applicable) was performed at [facility]. [address], [place], [place] [address].  IMMUNOHISTOCHEMISTRY DISCLAIMER (if applicable): Some of these immunohistochemical stains may have been developed and the performance characteristics determine by [facility]. Some may not have been cleared or approved by the U.S. Food and Drug Administration. The FDA has determined that such clearance or approval is not necessary. This test is used for clinical purposes. It should not be regarded as investigational or for research. This laboratory is certified under the [place] (CLIA-88) as qualified to perform high complexity clinical laboratory testing.  The controls stained appropriately.     This visit occurred during the SARS-CoV-2 public health emergency.  Safety protocols were in place, including screening questions prior to the visit, additional usage of staff PPE, and extensive cleaning of exam room while observing appropriate contact time as indicated for disinfecting solutions.   COVID 19 screen:  No recent travel or known exposure to COVID19 The patient  denies respiratory symptoms of COVID 19 at this time. The importance of social distancing was discussed today.   Assessment and Plan    Problem List Items Addressed This Visit     Class 2 severe obesity due to excess calories with serious comorbidity and body mass index (BMI) of 37.0 to 37.9 in adult [facility])   Encounter for weight management - Primary     Discussed goals for weight loss in next month.  She plans continued  Portion control and plans to add  Longer time for meals.  discussed low blood sugars and increase protein and fiber in breakfast choice.  Continue yoga and treadmill and try to increase frequency or duration.  Will continue bariatric support group meeting.           Eliezer Lofts, MD

## 2021-03-14 NOTE — Telephone Encounter (Signed)
Submitted a Prior Authorization request to [facility] for [facility] via CoverMyMeds. Will update once we receive a response.   (KeyKathaleen Bury) - [id number]

## 2021-03-14 NOTE — Telephone Encounter (Signed)
Received notification from [facility] regarding a prior authorization for [facility]. Authorization has been APPROVED from 03/14/21 to 03/29/22.   Authorization # [id number]

## 2021-03-14 NOTE — Assessment & Plan Note (Signed)
Discussed goals for weight loss in next month.  She plans continued  Portion control and plans to add  Longer time for meals.  discussed low blood sugars and increase protein and fiber in breakfast choice.  Continue yoga and treadmill and try to increase frequency or duration.  Will continue bariatric support group meeting.

## 2021-03-18 ENCOUNTER — Other Ambulatory Visit (HOSPITAL_COMMUNITY): Payer: Self-pay

## 2021-03-20 ENCOUNTER — Other Ambulatory Visit (HOSPITAL_COMMUNITY): Payer: Self-pay

## 2021-03-20 ENCOUNTER — Other Ambulatory Visit: Payer: Self-pay | Admitting: Physician Assistant

## 2021-03-20 DIAGNOSIS — L409 Psoriasis, unspecified: Secondary | ICD-10-CM

## 2021-03-20 DIAGNOSIS — L4050 Arthropathic psoriasis, unspecified: Secondary | ICD-10-CM

## 2021-03-20 MED ORDER — TREMFYA 100 MG/ML ~~LOC~~ SOAJ
100.0000 mg | SUBCUTANEOUS | 0 refills | Status: DC
  Filled 2021-03-20: qty 1, 56d supply, fill #0

## 2021-03-20 NOTE — Telephone Encounter (Signed)
Next Visit: 05/07/2021  Last Visit: 02/05/2021  Last Fill: 01/27/2021  UI:[id number] arthritis   Current Dose per office note 02/05/2021: Tremfya 100 mg/mL sq injections every 8 weeks  Labs: 02/05/2021 CBC and CMP WNL  TB Gold: 09/09/2020 Negative   Okay to refill Tremfya?

## 2021-03-26 ENCOUNTER — Other Ambulatory Visit (HOSPITAL_COMMUNITY): Payer: Self-pay

## 2021-03-28 ENCOUNTER — Other Ambulatory Visit: Payer: Self-pay | Admitting: Family Medicine

## 2021-03-30 HISTORY — PX: UPPER GI ENDOSCOPY: SHX6162

## 2021-04-01 NOTE — Telephone Encounter (Signed)
Last office visit 03/14/2021 for weight check.  Last refilled 11/14/2020 for #180 with no refills.  Next Appt: 04/11/21 for weight loss.

## 2021-04-03 NOTE — Telephone Encounter (Signed)
Patient states that she submitted her application to [employer] and emailed me with notification. I apologized but I had not received an email.  Provider portion submitted for [employer] PAP renewal for Tremfya along with insurance card copy, med list. She will be taking her next dose on Tuesday, 04/08/21 and has a dose at home for this.  Fax: [phone] Phone: [phone]  Knox Saliva, PharmD, MPH, BCPS Clinical Pharmacist (Rheumatology and Pulmonology)

## 2021-04-07 ENCOUNTER — Other Ambulatory Visit: Payer: Self-pay | Admitting: Family Medicine

## 2021-04-07 NOTE — Telephone Encounter (Signed)
Last office visit 03/14/2021 for weight check.  Last refilled 03/11/2021 for #180 with no refills.  Next Appt: 04/11/2021 for weight check.

## 2021-04-08 ENCOUNTER — Other Ambulatory Visit: Payer: Self-pay | Admitting: Family Medicine

## 2021-04-11 ENCOUNTER — Ambulatory Visit: Payer: Medicare Other | Admitting: Family Medicine

## 2021-04-15 ENCOUNTER — Ambulatory Visit: Payer: Medicare Other | Admitting: Skilled Nursing Facility1

## 2021-04-17 ENCOUNTER — Telehealth: Payer: Self-pay | Admitting: Pharmacist

## 2021-04-17 ENCOUNTER — Ambulatory Visit: Payer: Medicare Other | Admitting: Family Medicine

## 2021-04-17 MED ORDER — BETAMETHASONE DIPROPIONATE AUG 0.05 % EX OINT: TOPICAL_OINTMENT | Freq: Two times a day (BID) | CUTANEOUS | 0 refills | Status: DC | PRN

## 2021-04-17 NOTE — Telephone Encounter (Signed)
Patient due for next Tremfya dose on 06/05/21. Will communicate to [employer] rep  Knox Saliva, PharmD, MPH, BCPS Clinical Pharmacist (Rheumatology and Pulmonology)

## 2021-04-17 NOTE — Telephone Encounter (Signed)
Please pend refill request to be sent to [facility].

## 2021-04-17 NOTE — Telephone Encounter (Signed)
Called patient regarding Tremfya application.  She said she's been trying to get an ointment refilled with her dermatologist who is not responding to request. She is requesting we send refill to [facility] in [place]. Med: betamethasone dipropionate ointment 0.05%, 50g qty Direction: apply twicw daily as needed

## 2021-04-18 ENCOUNTER — Other Ambulatory Visit: Payer: Self-pay

## 2021-04-18 ENCOUNTER — Ambulatory Visit (INDEPENDENT_AMBULATORY_CARE_PROVIDER_SITE_OTHER): Payer: Medicare Other | Admitting: Family Medicine

## 2021-04-18 ENCOUNTER — Encounter: Payer: Self-pay | Admitting: Family Medicine

## 2021-04-18 VITALS — BP 120/70 | HR 90 | Temp 97.6°F | Ht 68.0 in | Wt 246.4 lb

## 2021-04-18 DIAGNOSIS — Z6837 Body mass index (BMI) 37.0-37.9, adult: Secondary | ICD-10-CM | POA: Diagnosis not present

## 2021-04-18 DIAGNOSIS — Z7689 Persons encountering health services in other specified circumstances: Secondary | ICD-10-CM | POA: Diagnosis not present

## 2021-04-18 NOTE — Progress Notes (Signed)
Patient ID: Jillian [name], female    DOB: [birth year], 66 y.o.   MRN: [id number]  This visit was conducted in person.  BP 120/70    Pulse 90    Temp 97.6 F (36.4 C) (Temporal)    Ht _0  (1.727 m)    Wt 246 lb 6 oz (111.8 kg)    SpO2 97%    BMI 37.46 kg/m    CC: Chief Complaint  Patient presents with   Weight Check    Subjective:   HPI: Jillian [name] is a 66 y.o. female presenting on 04/18/2021 for Weight Check   Monthly weight assessment and visit leading up to bariatric surgery. She plans gastric sleeve surgery...  03/2021. Dr.  Lanny Hurst   She has been working on :treadmill 40 min  and yoga 45 min three times a week. She has been seeing a nutritionist.. eating low carb diet... has had some struggles doing that given  she doesn't feel well on low carbs... blood sugar sometimes drops low.  She is going to monthly weight loss support group meetings    Goals for past  month: She is planning on chewing food more slowly and making a meal last 20 min.    She reports that she has successfully met these goals 25% of the time. Has been dealing with mothers. Recent ill... increased stress, loess able to focus on self.  She has not been able to exercise as much.  Less concentrated sweet.   She is excited about upcoming surgery.  Has met with a friend who is s/p gastric sleeve surgery.  3 lb weight loss since last OV. Wt Readings from Last 3 Encounters:  04/18/21 246 lb 6 oz (111.8 kg)  03/14/21 249 lb (112.9 kg)  02/14/21 245 lb 8 oz (111.4 kg)   Body mass index is 37.46 kg/m.       Relevant past medical, surgical, family and social history reviewed and updated as indicated. Interim medical history since our last visit reviewed. Allergies and medications reviewed and updated. Outpatient Medications Prior to Visit  Medication Sig Dispense Refill   augmented betamethasone dipropionate (DIPROLENE-AF) 0.05 % ointment Apply topically 2 (two) times daily as needed. 50 g  0   buPROPion (WELLBUTRIN XL) 150 MG 24 hr tablet TAKE 1 TABLET BY MOUTH  DAILY 90 tablet 1   Coenzyme Q10 (CO Q 10) 100 MG CAPS Take 100 mg by mouth daily.      DULoxetine (CYMBALTA) 30 MG capsule TAKE 3 CAPSULES BY MOUTH  DAILY 270 capsule 1   fluocinonide (LIDEX) 0.05 % external solution Apply 1 application topically 2 (two) times daily as needed (PSORIASIS).     Guselkumab (TREMFYA) 100 MG/ML SOPN Inject 100 mg into the skin every 8 (eight) weeks. 1 mL 0   Magnesium Malate 1250 (141.7 Mg) MG TABS Take 1 tablet by mouth in the morning, at noon, and at bedtime.     methocarbamol (ROBAXIN) 500 MG tablet TAKE 1 TABLET BY MOUTH 3  TIMES DAILY AS NEEDED FOR  MUSCLE SPASMS 270 tablet 0   pregabalin (LYRICA) 150 MG capsule TAKE 1 CAPSULE BY MOUTH  TWICE DAILY 180 capsule 0   traMADol (ULTRAM) 50 MG tablet TAKE 2 TABLETS BY MOUTH 3 TIMES  DAILY 180 tablet 0   Vitamin D, Ergocalciferol, (DRISDOL) 1.25 MG (50000 UNIT) CAPS capsule TAKE 1 CAPSULE BY MOUTH  EVERY 7 DAYS 13 capsule 3   No facility-administered medications prior to visit.  Per HPI unless specifically indicated in ROS section below Review of Systems  Constitutional:  Negative for fatigue and fever.  HENT:  Negative for ear pain.   Eyes:  Negative for pain.  Respiratory:  Negative for chest tightness and shortness of breath.   Cardiovascular:  Negative for chest pain, palpitations and leg swelling.  Gastrointestinal:  Negative for abdominal pain.  Genitourinary:  Negative for dysuria.  Objective:  BP 120/70    Pulse 90    Temp 97.6 F (36.4 C) (Temporal)    Ht _0  (1.727 m)    Wt 246 lb 6 oz (111.8 kg)    SpO2 97%    BMI 37.46 kg/m   Wt Readings from Last 3 Encounters:  04/18/21 246 lb 6 oz (111.8 kg)  03/14/21 249 lb (112.9 kg)  02/14/21 245 lb 8 oz (111.4 kg)      Physical Exam Constitutional:      General: She is not in acute distress.    Appearance: Normal appearance. She is well-developed. She is not ill-appearing  or toxic-appearing.  HENT:     Head: Normocephalic.     Right Ear: Hearing, tympanic membrane, ear canal and external ear normal. Tympanic membrane is not erythematous, retracted or bulging.     Left Ear: Hearing, tympanic membrane, ear canal and external ear normal. Tympanic membrane is not erythematous, retracted or bulging.     Nose: No mucosal edema or rhinorrhea.     Right Sinus: No maxillary sinus tenderness or frontal sinus tenderness.     Left Sinus: No maxillary sinus tenderness or frontal sinus tenderness.     Mouth/Throat:     Pharynx: Uvula midline.  Eyes:     General: Lids are normal. Lids are everted, no foreign bodies appreciated.     Conjunctiva/sclera: Conjunctivae normal.     Pupils: Pupils are equal, round, and reactive to light.  Neck:     Thyroid: No thyroid mass or thyromegaly.     Vascular: No carotid bruit.     Trachea: Trachea normal.  Cardiovascular:     Rate and Rhythm: Normal rate and regular rhythm.     Pulses: Normal pulses.     Heart sounds: Normal heart sounds, S1 normal and S2 normal. No murmur heard.   No friction rub. No gallop.  Pulmonary:     Effort: Pulmonary effort is normal. No tachypnea or respiratory distress.     Breath sounds: Normal breath sounds. No decreased breath sounds, wheezing, rhonchi or rales.  Abdominal:     General: Bowel sounds are normal.     Palpations: Abdomen is soft.     Tenderness: There is no abdominal tenderness.  Musculoskeletal:     Cervical back: Normal range of motion and neck supple.  Skin:    General: Skin is warm and dry.     Findings: No rash.  Neurological:     Mental Status: She is alert.  Psychiatric:        Mood and Affect: Mood is not anxious or depressed.        Speech: Speech normal.        Behavior: Behavior normal. Behavior is cooperative.        Thought Content: Thought content normal.        Judgment: Judgment normal.      Results for orders placed or performed during the hospital  encounter of 02/05/21  Surgical pathology  Result Value Ref Range   SURGICAL PATHOLOGY  SURGICAL PATHOLOGY CASE: [id number] PATIENT: [place] Surgical Pathology Report     Clinical History: GERD, morbid obesity (crm)     FINAL MICROSCOPIC DIAGNOSIS:  A. STOMACH, ANTRUM, BIOPSY: - Chronic active gastritis. - Negative for Helicobacter pylori by immunohistochemistry. - Negative for intestinal metaplasia, dysplasia, and malignancy.  GROSS DESCRIPTION:  Received in formalin is a pink-white soft tissue fragment that is submitted in toto.  Size: 0.25 cm, 1 block submitted.  SW 02/05/2021   Final Diagnosis performed by Betsy Pries, MD.   Electronically signed 02/07/2021 Technical component performed at [facility], [place] [address] [place], [place] [address].  Professional component performed at [facility]. [address] [place], [place] [address]  Immunohistochemistry Technical component (if applicable) was performed at [facility]. [address], [place], [place] [address].  IMMUNOHISTOCHEMISTRY DISCLAIMER (if applicable): Some of these immunohistochemical stains may have been developed and the performance characteristics determine by [facility]. Some may not have been cleared or approved by the U.S. Food and Drug Administration. The FDA has determined that such clearance or approval is not necessary. This test is used for clinical purposes. It should not be regarded as investigational or for research. This laboratory is certified under the [place] (CLIA-88) as qualified to perform high complexity clinical laboratory testing.  The controls stained appropriately.     This visit occurred during the SARS-CoV-2 public health emergency.  Safety protocols were in place, including screening questions prior to the visit,  additional usage of staff PPE, and extensive cleaning of exam room while observing appropriate contact time as indicated for disinfecting solutions.   COVID 19 screen:  No recent travel or known exposure to COVID19 The patient denies respiratory symptoms of COVID 19 at this time. The importance of social distancing was discussed today.   Assessment and Plan Problem List Items Addressed This Visit     Class 2 severe obesity due to excess calories with serious comorbidity and body mass index (BMI) of 37.0 to 37.9 in adult [facility])   Encounter for weight management - Primary    Gastric sleeve surgery  in process of being scheduled. She is motivated fro surgery and is aware of need for continued motivation and lifestyle changes after surgery. Reviewed heart healthy diet.            Eliezer Lofts, MD

## 2021-04-18 NOTE — Assessment & Plan Note (Signed)
Gastric sleeve surgery  in process of being scheduled. She is motivated fro surgery and is aware of need for continued motivation and lifestyle changes after surgery. Reviewed heart healthy diet.

## 2021-04-25 NOTE — Progress Notes (Signed)
Office Visit Note  Patient: Jillian [name]             Date of Birth: December 11, 1955           MRN: [id number]             PCP: Jinny Sanders, MD Referring: Jinny Sanders, MD Visit Date: 05/07/2021 Occupation: '@GUAROCC' @  Subjective:  Psoriasis you still have significant stiffness in the morning  History of Present Illness: Jillian [name] is a 66 y.o. female with a history of psoriatic arthritis, psoriasis and osteoarthritis.  She states that her psoriatic arthritis is well controlled on Tremfya.  She has not had any recurrence of [place] fasciitis or SI joint pain.  She continues to have some generalized pain from osteoarthritis and fibromyalgia.  She has noticed improvement in psoriasis but not complete clearance.  She continues to have rash on her bilateral palms, scalp, and natal cleft.  She continues to have some rash on her left shin.  Activities of Daily Living:  Patient reports morning stiffness for 4 hours.   Patient Reports nocturnal pain.  Difficulty dressing/grooming: Denies Difficulty climbing stairs: Denies Difficulty getting out of chair: Denies Difficulty using hands for taps, buttons, cutlery, and/or writing: Denies  Review of Systems  Constitutional:  Positive for fatigue.  HENT:  Negative for mouth sores, mouth dryness and nose dryness.   Eyes:  Negative for pain, itching and dryness.  Respiratory:  Negative for shortness of breath and difficulty breathing.   Cardiovascular:  Negative for chest pain and palpitations.  Gastrointestinal:  Negative for blood in stool, constipation and diarrhea.  Endocrine: Negative for increased urination.  Genitourinary:  Negative for difficulty urinating.  Musculoskeletal:  Positive for joint pain, joint pain, myalgias, morning stiffness, muscle tenderness and myalgias. Negative for joint swelling.  Skin:  Positive for rash. Negative for color change.  Allergic/Immunologic: Negative for susceptible to infections.  Neurological:   Positive for weakness. Negative for dizziness, numbness, headaches and memory loss.  Hematological:  Negative for bruising/bleeding tendency and swollen glands.  Psychiatric/Behavioral:  Negative for depressed mood, confusion and sleep disturbance. The patient is not nervous/anxious.    PMFS History:  Patient Active Problem List   Diagnosis Date Noted   Psoriatic arthritis ([place]) 05/07/2021   Psoriasis 05/07/2021   Primary osteoarthritis of both hands 05/07/2021   Primary osteoarthritis of both feet 05/07/2021   Encounter for weight management 02/14/2021   Pitting of nails 01/23/2020   OSA (obstructive sleep apnea) 04/12/2019   S/P left unicompartmental knee replacement 03/21/2019   Osteoarthritis of left knee 03/09/2019   Primary localized osteoarthritis of right knee 02/07/2019   Status post right partial knee replacement 02/07/2019   Class 2 severe obesity due to excess calories with serious comorbidity and body mass index (BMI) of 37.0 to 37.9 in adult [facility]) 05/13/2018   Prediabetes 12/03/2017   Non-restorative sleep 02/27/2016   Vitamin D deficiency 10/28/2015   Generalized anxiety disorder 11/25/2012   HYPERTRIGLYCERIDEMIA 12/20/2008   MENOPAUSE, EARLY 10/04/2008   Fibromyalgia 10/04/2008    Past Medical History:  Diagnosis Date   Cancer ([place])    basal cell 2016   Dermatitis    per patient    Fibromyalgia    Pre-diabetes    Primary localized osteoarthritis of right knee 02/07/2019   Sleep apnea    uses CPAP    Family History  Problem Relation Age of Onset   Cancer Father  colon   Hyperlipidemia Father    Hypertension Father    Diabetes Sister    Cancer Paternal Aunt        colon   Cancer Maternal Grandmother        colon   Hypertension Maternal Grandmother    Anuerysm Paternal Grandfather    Psoriasis Brother    Arthritis Brother    Psoriasis Sister    Osteoarthritis Brother    Diabetes Brother    Throat cancer Brother    Healthy Son    Healthy  Daughter    Past Surgical History:  Procedure Laterality Date   BIOPSY  02/05/2021   Procedure: BIOPSY;  Surgeon: Stechschulte, Nickola Major, MD;  Location: [facility];  Service: General;;   CESAREAN SECTION     times 2   ESOPHAGOGASTRODUODENOSCOPY N/A 02/05/2021   Procedure: ESOPHAGOGASTRODUODENOSCOPY (EGD);  Surgeon: Felicie Morn, MD;  Location: [facility];  Service: General;  Laterality: N/A;   EYE SURGERY Bilateral 04/24/2020   bilateral eyelids    HERNIA REPAIR     PARTIAL KNEE ARTHROPLASTY Right 02/07/2019   Procedure: UNICOMPARTMENTAL KNEE;  Surgeon: Marchia Bond, MD;  Location: [facility];  Service: Orthopedics;  Laterality: Right;   PARTIAL KNEE ARTHROPLASTY Left 03/21/2019   Procedure: UNICOMPARTMENTAL KNEE;  Surgeon: Marchia Bond, MD;  Location: [facility];  Service: Orthopedics;  Laterality: Left;   UPPER GI ENDOSCOPY  03/2021   Social History   Social History Narrative   Regular exercise-yes, but seasonal walks and swims   Diet: healthy, low carbohydrates, no caffeine   Immunization History  Administered Date(s) Administered   Influenza-Unspecified 01/01/2020, 12/28/2020   Moderna Sars-Covid-2 Vaccination 05/23/2019, 07/04/2019   PNEUMOCOCCAL CONJUGATE-20 08/16/2020   Td 03/30/2004   Tdap 10/28/2015   Zoster, Live 08/30/2014     Objective: Vital Signs: BP 123/82 (BP Location: Left Arm, Patient Position: Sitting, Cuff Size: Large)    Pulse 81    Ht '5\' 8"'  (1.727 m)    Wt 246 lb (111.6 kg)    BMI 37.40 kg/m    Physical Exam Vitals and nursing note reviewed.  Constitutional:      Appearance: She is well-developed.  HENT:     Head: Normocephalic and atraumatic.  Eyes:     Conjunctiva/sclera: Conjunctivae normal.  Cardiovascular:     Rate and Rhythm: Normal rate and regular rhythm.     Heart sounds: Normal heart sounds.  Pulmonary:     Effort: Pulmonary effort is normal.     Breath sounds: Normal breath sounds.  Abdominal:     General: Bowel sounds are  normal.     Palpations: Abdomen is soft.  Musculoskeletal:     Cervical back: Normal range of motion.  Lymphadenopathy:     Cervical: No cervical adenopathy.  Skin:    General: Skin is warm and dry.     Capillary Refill: Capillary refill takes less than 2 seconds.     Comments: Dry scales were noted over bilateral palms, scales were noted in the scalp and in the natal cleft.  Neurological:     Mental Status: She is alert and oriented to person, place, and time.  Psychiatric:        Behavior: Behavior normal.     Musculoskeletal Exam: C-spine was in good range of motion.  She had no tenderness over thoracic or lumbar spine.  There was no tenderness over SI joints.  Shoulder joints, elbow joints, wrist joints, MCPs PIPs and DIPs with good range of motion  with no synovitis.  Hip joints, knee joints, ankles, MTPs and PIPs with good range of motion with no synovitis.  CDAI Exam: CDAI Score: -- Patient Global: --; Provider Global: -- Swollen: --; Tender: -- Joint Exam 05/07/2021   No joint exam has been documented for this visit   There is currently no information documented on the homunculus. Go to the Rheumatology activity and complete the homunculus joint exam.  Investigation: No additional findings.  Imaging: No results found.  Recent Labs: Lab Results  Component Value Date   WBC 6.5 02/05/2021   HGB 13.6 02/05/2021   PLT 215 02/05/2021   NA 140 02/05/2021   K 4.5 02/05/2021   CL 106 02/05/2021   CO2 24 02/05/2021   GLUCOSE 101 (H) 02/05/2021   BUN 12 02/05/2021   CREATININE 0.77 02/05/2021   BILITOT 0.4 02/05/2021   ALKPHOS 106 10/15/2020   AST 21 02/05/2021   ALT 29 02/05/2021   PROT 6.2 02/05/2021   ALBUMIN 4.2 10/15/2020   CALCIUM 9.4 02/05/2021   GFRAA 83 07/16/2020   QFTBGOLDPLUS Negative 09/09/2020    Speciality Comments: tremfya -started September 16, 2020  Procedures:  No procedures performed Allergies: Patient has no known allergies.   Assessment /  Plan:     Visit Diagnoses: Psoriatic arthritis ([place]) - Active psoriasis, plantar fasciitis, fingernail pitting, RF-, HLA-B27-, chronic SI joint pain, family history of psoriasis and psoriatic arthritis: She had no synovitis on my examination.  Her psoriatic arthritis is well controlled but she continues to have active psoriasis.  She has been on Tremfya for more than 6 months now.  She started [facility] in June 2022.  I had a detailed discussion with the patient regarding different treatment options.  After reviewing indications side effects contraindications patient decided to proceed with Taltz.  A handout was given and consent was taken.  We will apply for Taltz.  Patient had colonoscopy in the past which was negative.  There is no family history of IBD.  Medication counseling:  Baseline Immunosuppressant Therapy Labs TB GOLD Quantiferon TB Gold Latest Ref Rng & Units 09/09/2020  Quantiferon TB Gold Plus Negative Negative   Hepatitis Panel Hepatitis Latest Ref Rng & Units 09/09/2020  Hep B Surface Ag Negative Negative  Hep B IgM Negative Negative  Hep C Ab NEGATIVE -   HIV Lab Results  Component Value Date   HIV Non Reactive 12/13/2018   Immunoglobulins Immunoglobulin Electrophoresis Latest Ref Rng & Units 09/09/2020  IgG 586 - 1,602 mg/dL 809  IgM 26 - 217 mg/dL 31   SPEP Serum Protein Electrophoresis Latest Ref Rng & Units 02/05/2021  Total Protein 6.1 - 8.1 g/dL 6.2   Chest Xray: September 09, 2020  Does patient have a history of inflammatory bowel disease? No  Counseled patient that Donnetta Hail is a IL-17 inhibitor that works to reduce pain and inflammation associated with arthritis.  Counseled patient on purpose, proper use, and adverse effects of Taltz. Reviewed the most common adverse effects of infection, inflammatory bowel disease, and allergic reaction. Counseled patient that Donnetta Hail should be held for infection and prior to scheduled surgery.  Counseled patient to avoid live vaccines  while on Taltz.  Advised patient to get annual influenza vaccine, pneumococcal vaccine, and Shingrix as indicated.  Reviewed storage information for Taltz.  Reviewed the importance of regular labs while on [place]. Standing orders placed and is to return in 1 month and then every 3 months after initiation.  Provided patient with medication education  material and answered all questions.  Patient consented to [place].  Will upload consent into patient's chart.  Will apply for Taltz through patient's insurance and update when we receive a response.  Advised initial injection must be administered in office.  Patient voiced understanding.    Taltz dose will be: For psoriatic arthritis and plaque psoriasis overlap load of 160 mg then 80 mg on weeks 2,4,6,8,10,12 then 80 mg every 28 days  Prescription will be sent to pharmacy pending lab results and insurance approval.   Psoriasis - Diagnosed by Dr. Venetia Maxon.  She continues to have psoriasis.  She has active disease with the psoriasis patches in her scalp, palms and natal cleft.  She also has dry skin and excoriation marks on her left ankle and shin.  Prescription refills for Lidex 0.5% solution and Diprolene ointment were sent.  Pitting of nails-noted.  High risk medication use - Tremfya 100 mg/mL sq injections every 8 weeks.  Initiated therapy on 09/16/20. - Plan: CBC with Differential/Platelet, COMPLETE METABOLIC PANEL WITH GFR  Primary osteoarthritis of both hands -she had no synovitis on examination.  Radiographic findings on 04/08/2020 were consistent with osteoarthritic changes.  Primary osteoarthritis of both feet -she had no synovitis on examination.  Radiographic findings from 04/08/2020 were consistent with osteoarthritic changes in both feet.  Status post right partial knee replacement - Dr. Silva Bandy on 02/07/2019.  Doing well with no discomfort.  S/P left unicompartmental knee replacement - Dr. Silva Bandy on 03/21/2019.  She had no  warmth swelling or effusion.  Chronic SI joint pain -she had no tenderness on examination today.  X-rays of the pelvis were unremarkable on 04/08/2020.  Family history of psoriatic arthritis - Sister and brother   Fibromyalgia - Lyrica 150 mg twice daily, methocarbamol 500 mg 3 times daily as needed for muscle spasms, Cymbalta 3 capsules by mouth daily, and tramadol prescribed by her PCP.  She continues to have some generalized pain and discomfort from fibromyalgia.  Prediabetes-dietary modifications and exercise were discussed.  Increased risk of heart disease with psoriatic arthritis was discussed.  OSA (obstructive sleep apnea)  Non-restorative sleep  Generalized anxiety disorder-controlled on medications.  Vitamin D deficiency-she takes vitamin D supplement.  Orders: Orders Placed This Encounter  Procedures   CBC with Differential/Platelet   COMPLETE METABOLIC PANEL WITH GFR   Meds ordered this encounter  Medications   fluocinonide (LIDEX) 0.05 % external solution    Sig: Apply 1 application topically 2 (two) times daily as needed (PSORIASIS).    Dispense:  60 mL    Refill:  2   augmented betamethasone dipropionate (DIPROLENE-AF) 0.05 % ointment    Sig: Apply topically 2 (two) times daily as needed.    Dispense:  50 g    Refill:  0     Follow-Up Instructions: Return in about 6 weeks (around 06/18/2021) for Psoriatic arthritis, Osteoarthritis.   Bo Merino, MD  Note - This record has been created using Editor, commissioning.  Chart creation errors have been sought, but may not always  have been located. Such creation errors do not reflect on  the standard of medical care.

## 2021-05-03 ENCOUNTER — Other Ambulatory Visit: Payer: Self-pay | Admitting: Family Medicine

## 2021-05-04 NOTE — Telephone Encounter (Signed)
Last office visit 04/18/21 for weight management.  Last refilled 04/07/21 for #180 with no refills.  Next Appt: 08/19/21 for CPE.

## 2021-05-05 ENCOUNTER — Encounter: Payer: Medicare Other | Attending: Surgery | Admitting: Skilled Nursing Facility1

## 2021-05-05 ENCOUNTER — Other Ambulatory Visit: Payer: Self-pay

## 2021-05-05 DIAGNOSIS — Z713 Dietary counseling and surveillance: Secondary | ICD-10-CM | POA: Insufficient documentation

## 2021-05-05 DIAGNOSIS — E669 Obesity, unspecified: Secondary | ICD-10-CM | POA: Insufficient documentation

## 2021-05-05 NOTE — Progress Notes (Addendum)
Pre-Operative Nutrition Class:    Patient was seen on 02/06/202 for Pre-Operative Bariatric Surgery Education at [facility].    Surgery date:  Surgery type: sleeve Start weight at [facility]: 242 Weight today: 244  Samples given per MNT protocol. Patient educated on appropriate usage: Ensure max exp: May 28, 2021 Ensure max lot: [id number]  Chewable bariatric advantage: advanced multi EA exp: 08/23 Chewable bariatric advantage: advanced multi EA lot: [id number]  Bariatric advantage calcium citrate exp: 03/23 Bariatric advantage calcium citrate lot: [id number]   The following the learning objectives were met by the patient during this course: Identify Pre-Op Dietary Goals and will begin 2 weeks pre-operatively Identify appropriate sources of fluids and proteins  State protein recommendations and appropriate sources pre and post-operatively Identify Post-Operative Dietary Goals and will follow for 2 weeks post-operatively Identify appropriate multivitamin and calcium sources Describe the need for physical activity post-operatively and will follow MD recommendations State when to call healthcare provider regarding medication questions or post-operative complications When having a diagnosis of diabetes understanding hypoglycemia symptoms and the inclusion of 1 complex carbohydrate per meal  Handouts given during class include: Pre-Op Bariatric Surgery Diet Handout Protein Shake Handout Post-Op Bariatric Surgery Nutrition Handout BELT Program Information Flyer Support Group Information Flyer [facility] Bariatric Supplements Price List  Follow-Up Plan: Patient will follow-up at [facility] 2 weeks post operatively for diet advancement per MD.

## 2021-05-07 ENCOUNTER — Encounter: Payer: Self-pay | Admitting: Rheumatology

## 2021-05-07 ENCOUNTER — Telehealth: Payer: Self-pay

## 2021-05-07 ENCOUNTER — Ambulatory Visit: Payer: Medicare Other | Admitting: Rheumatology

## 2021-05-07 ENCOUNTER — Other Ambulatory Visit: Payer: Self-pay

## 2021-05-07 VITALS — BP 123/82 | HR 81 | Ht 68.0 in | Wt 246.0 lb

## 2021-05-07 DIAGNOSIS — M19041 Primary osteoarthritis, right hand: Secondary | ICD-10-CM

## 2021-05-07 DIAGNOSIS — M797 Fibromyalgia: Secondary | ICD-10-CM | POA: Diagnosis not present

## 2021-05-07 DIAGNOSIS — M19071 Primary osteoarthritis, right ankle and foot: Secondary | ICD-10-CM | POA: Diagnosis not present

## 2021-05-07 DIAGNOSIS — Z96652 Presence of left artificial knee joint: Secondary | ICD-10-CM | POA: Diagnosis not present

## 2021-05-07 DIAGNOSIS — M533 Sacrococcygeal disorders, not elsewhere classified: Secondary | ICD-10-CM | POA: Diagnosis not present

## 2021-05-07 DIAGNOSIS — Z840 Family history of diseases of the skin and subcutaneous tissue: Secondary | ICD-10-CM

## 2021-05-07 DIAGNOSIS — F411 Generalized anxiety disorder: Secondary | ICD-10-CM

## 2021-05-07 DIAGNOSIS — Z96651 Presence of right artificial knee joint: Secondary | ICD-10-CM | POA: Diagnosis not present

## 2021-05-07 DIAGNOSIS — Z79899 Other long term (current) drug therapy: Secondary | ICD-10-CM

## 2021-05-07 DIAGNOSIS — L409 Psoriasis, unspecified: Secondary | ICD-10-CM

## 2021-05-07 DIAGNOSIS — R7303 Prediabetes: Secondary | ICD-10-CM

## 2021-05-07 DIAGNOSIS — L608 Other nail disorders: Secondary | ICD-10-CM

## 2021-05-07 DIAGNOSIS — G8929 Other chronic pain: Secondary | ICD-10-CM

## 2021-05-07 DIAGNOSIS — G4733 Obstructive sleep apnea (adult) (pediatric): Secondary | ICD-10-CM

## 2021-05-07 DIAGNOSIS — G478 Other sleep disorders: Secondary | ICD-10-CM

## 2021-05-07 DIAGNOSIS — E559 Vitamin D deficiency, unspecified: Secondary | ICD-10-CM

## 2021-05-07 DIAGNOSIS — L4050 Arthropathic psoriasis, unspecified: Secondary | ICD-10-CM | POA: Insufficient documentation

## 2021-05-07 DIAGNOSIS — M19072 Primary osteoarthritis, left ankle and foot: Secondary | ICD-10-CM

## 2021-05-07 DIAGNOSIS — M19042 Primary osteoarthritis, left hand: Secondary | ICD-10-CM

## 2021-05-07 LAB — CBC WITH DIFFERENTIAL/PLATELET
Absolute Monocytes: 897 cells/uL (ref 200–950)
Basophils Absolute: 59 cells/uL (ref 0–200)
Basophils Relative: 0.9 %
Eosinophils Absolute: 234 cells/uL (ref 15–500)
Eosinophils Relative: 3.6 %
HCT: 40.2 % (ref 35.0–45.0)
Hemoglobin: 13.7 g/dL (ref 11.7–15.5)
Lymphs Abs: 1833 cells/uL (ref 850–3900)
MCH: 31.4 pg (ref 27.0–33.0)
MCHC: 34.1 g/dL (ref 32.0–36.0)
MCV: 92.2 fL (ref 80.0–100.0)
MPV: 11 fL (ref 7.5–12.5)
Monocytes Relative: 13.8 %
Neutro Abs: 3478 cells/uL (ref 1500–7800)
Neutrophils Relative %: 53.5 %
Platelets: 217 10*3/uL (ref 140–400)
RBC: 4.36 10*6/uL (ref 3.80–5.10)
RDW: 12 % (ref 11.0–15.0)
Total Lymphocyte: 28.2 %
WBC: 6.5 10*3/uL (ref 3.8–10.8)

## 2021-05-07 LAB — COMPLETE METABOLIC PANEL WITH GFR
AG Ratio: 2.1 (calc) (ref 1.0–2.5)
ALT: 23 U/L (ref 6–29)
AST: 17 U/L (ref 10–35)
Albumin: 4.2 g/dL (ref 3.6–5.1)
Alkaline phosphatase (APISO): 105 U/L (ref 37–153)
BUN: 13 mg/dL (ref 7–25)
CO2: 27 mmol/L (ref 20–32)
Calcium: 9.3 mg/dL (ref 8.6–10.4)
Chloride: 104 mmol/L (ref 98–110)
Creat: 0.77 mg/dL (ref 0.50–1.05)
Globulin: 2 g/dL (calc) (ref 1.9–3.7)
Glucose, Bld: 90 mg/dL (ref 65–99)
Potassium: 4.4 mmol/L (ref 3.5–5.3)
Sodium: 137 mmol/L (ref 135–146)
Total Bilirubin: 0.3 mg/dL (ref 0.2–1.2)
Total Protein: 6.2 g/dL (ref 6.1–8.1)
eGFR: 85 mL/min/{1.73_m2} (ref >=60)

## 2021-05-07 MED ORDER — BETAMETHASONE DIPROPIONATE AUG 0.05 % EX OINT: TOPICAL_OINTMENT | Freq: Two times a day (BID) | CUTANEOUS | 0 refills | Status: DC | PRN

## 2021-05-07 MED ORDER — FLUOCINONIDE 0.05 % EX SOLN: 1.0000 "application " | Freq: Two times a day (BID) | CUTANEOUS | 2 refills | Status: DC | PRN

## 2021-05-07 NOTE — Patient Instructions (Addendum)
Ixekizumab injection What is this medication? IXEKIZUMAB (ix e KIZ ue mab) is used to treat plaque psoriasis, psoriatic arthritis, ankylosing spondylitis, and active non-radiographic axial spondyloarthritis. This medicine may be used for other purposes; ask your health care provider or pharmacist if you have questions. COMMON BRAND NAME(S): TALTZ What should I tell my care team before I take this medication? They need to know if you have any of these conditions: immune system problems infection (especially a viral infection such as chickenpox, cold sores, or herpes) recently received or are scheduled to receive a vaccine tuberculosis, a positive skin test for tuberculosis, or have recently been in close contact with someone who has tuberculosis an unusual or allergic reaction to ixekizumab, other medicines, foods, dyes or preservatives pregnant or trying to get pregnant breast-feeding How should I use this medication? This medicine is for injection under the skin. It may be administered by a healthcare professional in a hospital or clinic setting or at home. If you get this medicine at home, you will be taught how to prepare and give this medicine. Use exactly as directed. Take your medicine at regular intervals. Do not take your medicine more often than directed. It is important that you put your used needles and syringes in a special sharps container. Do not put them in a trash can. If you do not have a sharps container, call your pharmacist or healthcare provider to get one. A special MedGuide will be given to you by the pharmacist with each prescription and refill. Be sure to read this information carefully each time. Talk to your pediatrician regarding the use of this medicine in children. While this drug may be prescribed for children as young as 6 years for selected conditions, precautions do apply. Overdosage: If you think you have taken too much of this medicine contact a poison control  center or emergency room at once. NOTE: This medicine is only for you. Do not share this medicine with others. What if I miss a dose? It is important not to miss your dose. Call your doctor of health care professional if you are unable to keep an appointment. If you give yourself the medicine and you miss a dose, take it as soon as you can. Then be sure to take your next doses on your regular schedule. Do not take double or extra doses. If you have questions about a missed injection, call your health care professional. What may interact with this medication? Do not take this medicine with any of the following medications: live virus vaccines This medicine may also interact with the following medications: inactivated vaccines This list may not describe all possible interactions. Give your health care provider a list of all the medicines, herbs, non-prescription drugs, or dietary supplements you use. Also tell them if you smoke, drink alcohol, or use illegal drugs. Some items may interact with your medicine. What should I watch for while using this medication? Tell your doctor or healthcare professional if your symptoms do not start to get better or if they get worse. You will be tested for tuberculosis (TB) before you start this medicine. If your doctor prescribes any medicine for TB, you should start taking the TB medicine before starting this medicine. Make sure to finish the full course of TB medicine. Call your doctor or healthcare professional for advice if you get a fever, chills or sore throat, or other symptoms of a cold or flu. Do not treat yourself. This drug decreases your body's ability to  fight infections. Try to avoid being around people who are sick. This medicine can decrease the response to a vaccine. If you need to get vaccinated, tell your healthcare professional if you have received this medicine within the last 6 months. Extra booster doses may be needed. Talk to your doctor to see  if a different vaccination schedule is needed. What side effects may I notice from receiving this medication? Side effects that you should report to your doctor or health care professional as soon as possible: allergic reactions like skin rash, itching or hives, swelling of the face, lips, or tongue signs and symptoms of infection like fever or chills; cough; sore throat; pain or trouble passing urine signs and symptoms of bowel problems like abdominal pain, diarrhea, blood in the stool, and weight loss white patches in the mouth or throat vaginal discharge, itching, or odor in women Side effects that usually do not require medical attention (report to your doctor or health care professional if they continue or are bothersome): nausea runny nose sinus trouble This list may not describe all possible side effects. Call your doctor for medical advice about side effects. You may report side effects to FDA at 1-800-FDA-1088. Where should I keep my medication? Keep out of the reach of children. Store the prefilled syringe or injection pen in a refrigerator between 2 to 8 degrees C (36 to 46 degrees F). Keep the syringe or the pen in the original carton until ready for use. Protect from light. Do not freeze. Do not shake. Prior to use, remove the syringe or pen from the refrigerator and use within 30 minutes. Throw away any unused medicine after the expiration date on the label. NOTE: This sheet is a summary. It may not cover all possible information. If you have questions about this medicine, talk to your doctor, pharmacist, or health care provider.  2022 Elsevier/Gold Standard (2020-12-03 00:00:00)  Standing Labs We placed an order today for your standing lab work.   Please have your standing labs drawn in 1 month after starting Taltz and then every 3 months  If possible, please have your labs drawn 2 weeks prior to your appointment so that the provider can discuss your results at your  appointment.  Please note that you may see your imaging and lab results in [facility] before we have reviewed them. We may be awaiting multiple results to interpret others before contacting you. Please allow our office up to 72 hours to thoroughly review all of the results before contacting the office for clarification of your results.  We have open lab daily: Monday through Thursday from 1:30-4:30 PM and Friday from 1:30-4:00 PM at the office of Dr. Bo Merino, [facility].   Please be advised, all patients with office appointments requiring lab work will take precedent over walk-in lab work.  If possible, please come for your lab work on Monday and Friday afternoons, as you may experience shorter wait times. The office is located at [address] [place], [place], [place] [address] No appointment is necessary.   Labs are drawn by [facility]. Please bring your co-pay at the time of your lab draw.  You may receive a bill from [facility] for your lab work.  Please note if you are on Hydroxychloroquine and and an order has been placed for a Hydroxychloroquine level, you will need to have it drawn 4 hours or more after your last dose.  If you wish to have your labs drawn at another location,  please call the office 24 hours in advance to send orders.  If you have any questions regarding directions or hours of operation,  please call [phone].   As a reminder, please drink plenty of water prior to coming for your lab work. Thanks!   Vaccines You are taking a medication(s) that can suppress your immune system.  The following immunizations are recommended: Flu annually Covid-19  Td/Tdap (tetanus, diphtheria, pertussis) every 10 years Pneumonia (Prevnar 15 then Pneumovax 23 at least 1 year apart.  Alternatively, can take Prevnar 20 without needing additional dose) Shingrix: 2 doses from 4 weeks to 6 months apart  Please check with your PCP to make sure you are up to date.   If  you have signs or symptoms of an infection or start antibiotics: First, call your PCP for workup of your infection. Hold your medication through the infection, until you complete your antibiotics, and until symptoms resolve if you take the following: Injectable medication (Actemra, Benlysta, Cimzia, Cosentyx, Enbrel, Humira, Kevzara, Orencia, Remicade, Simponi, Stelara, Taltz, Tremfya) Methotrexate Leflunomide (Arava) Mycophenolate (Cellcept) Morrie Sheldon, Olumiant, or Rinvoq  Heart Disease Prevention   Your inflammatory disease increases your risk of heart disease which includes heart attack, stroke, atrial fibrillation (irregular heartbeats), high blood pressure, heart failure and atherosclerosis (plaque in the arteries).  It is important to reduce your risk by:   Keep blood pressure, cholesterol, and blood sugar at healthy levels   Smoking Cessation   Maintain a healthy weight  BMI 20-25   Eat a healthy diet  Plenty of fresh fruit, vegetables, and whole grains  Limit saturated fats, foods high in sodium, and added sugars  DASH and Mediterranean diet   Increase physical activity  Recommend moderate physically activity for 150 minutes per week/ 30 minutes a day for five days a week These can be broken up into three separate ten-minute sessions during the day.   Reduce Stress  Meditation, slow breathing exercises, yoga, coloring books  Dental visits twice a year

## 2021-05-07 NOTE — Telephone Encounter (Signed)
Please apply for taltz per Dr. Estanislado Pandy. Thanks!   Consent obtained and sent to the scan center.  Provided patient with patient assistance application and she will complete her portion and return to the office. Provider portion has been placed on Devki's desk.   Patient does have an upcoming surgery with surgery date TBD.

## 2021-05-08 NOTE — Progress Notes (Signed)
CBC and CMP normal

## 2021-05-08 NOTE — Telephone Encounter (Signed)
Patient switching to [employer]. Will send Tremfya PAP renewal application to scan center.

## 2021-05-09 ENCOUNTER — Other Ambulatory Visit (HOSPITAL_COMMUNITY): Payer: Self-pay

## 2021-05-09 NOTE — Telephone Encounter (Signed)
Received notification from [facility] regarding a prior authorization for [place]. Authorization has been APPROVED from 05/09/21 to 03/29/22.   Per test claim, copay for 28 days supply is $2082  Authorization # [id number] Phone # [phone]  Patient will need to use [employer] PAP. She took home pt assistance application. Provider portion will need to be completed  Knox Saliva, PharmD, MPH, BCPS Clinical Pharmacist (Rheumatology and Pulmonology)

## 2021-05-09 NOTE — Telephone Encounter (Signed)
Submitted a Prior Authorization request to [facility] for TALTZ via CoverMyMeds. Will update once we receive a response.  Key: [id number] PA Case ID: [id number]  Per CMM questions: preferred options are:  Cimzia, Cosentyx, Enbrel, Humira, Orencia, Simponi, Skyrizi, or Stelara  Patient has history of skin cancer - Humira, Cimzia Enbrel, Simponi should be avoided Patient was primary non-responder to Tremfya so may not respond to [employer] - no contraindication Cosentyx - no contraindication - may have to run authorization for this  Knox Saliva, PharmD, MPH, BCPS Clinical Pharmacist (Rheumatology and Pulmonology)

## 2021-05-12 ENCOUNTER — Other Ambulatory Visit (HOSPITAL_COMMUNITY): Payer: Self-pay

## 2021-05-12 ENCOUNTER — Ambulatory Visit: Payer: Self-pay | Admitting: Surgery

## 2021-05-12 DIAGNOSIS — Z01818 Encounter for other preprocedural examination: Secondary | ICD-10-CM

## 2021-05-12 NOTE — Telephone Encounter (Signed)
Called patient to notify about prior authorization approval for Taltz. Patient states she will complete her portion and return to [facility] directly. She will call me or send MyChart message to notify so pharmacy team can submit provider portion.  Signed provider portion, PA approval letter, med list, and insurance card copy placed in PAP pending info folder in pharmacy office.  Knox Saliva, PharmD, MPH, BCPS Clinical Pharmacist (Rheumatology and Pulmonology)

## 2021-05-19 NOTE — Progress Notes (Signed)
Covid test on 05/30/21 Come thru main entrance at [facility] hav a seat in the lobby on the right as you come thru the door. Call [phone] and let them know you are here for covid testing.                 Your procedure is scheduled on:               06/03/2021   Report to [facility] Main  Entrance   Report to admitting at   [place] AM     Call this number if you have problems the morning of surgery [phone]    REMEMBER: NO  SOLID FOOD CANDY OR GUM AFTER MIDNIGHT. CLEAR LIQUIDS UNTIL      0800am      . NOTHING BY MOUTH EXCEPT CLEAR LIQUIDS UNTIL   0800am  . PLEASE FINISH ENSURE DRINK PER SURGEON ORDER  WHICH NEEDS TO BE COMPLETED AT   0800am  .      CLEAR LIQUID DIET   Foods Allowed                                                                    Coffee and tea, regular and decaf                            Fruit ices (not with fruit pulp)                                      Iced Popsicles                                    Carbonated beverages, regular and diet                                    Cranberry, grape and apple juices Sports drinks like Gatorade Lightly seasoned clear broth or consume(fat free) Sugar, honey syrup ___________________________________________________________________      BRUSH YOUR TEETH MORNING OF SURGERY AND RINSE YOUR MOUTH OUT, NO CHEWING GUM CANDY OR MINTS.     Take these medicines the morning of surgery with A SIP OF WATER:   Wellbutrin, cymbalta, lyrica   DO NOT TAKE ANY DIABETIC MEDICATIONS DAY OF YOUR SURGERY                               You may not have any metal on your body including hair pins and              piercings  Do not wear jewelry, make-up, lotions, powders or perfumes, deodorant             Do not wear nail polish on your fingernails.  Do not shave  48 hours prior to surgery.              Men may shave face and neck.   Do not bring valuables to  the hospital. [facility] IS NOT             RESPONSIBLE   FOR  VALUABLES.  Contacts, dentures or bridgework may not be worn into surgery.  Leave suitcase in the car. After surgery it may be brought to your room.     Patients discharged the day of surgery will not be allowed to drive home. IF YOU ARE HAVING SURGERY AND GOING HOME THE SAME DAY, YOU MUST HAVE AN ADULT TO DRIVE YOU HOME AND BE WITH YOU FOR 24 HOURS. YOU MAY GO HOME BY TAXI OR UBER OR ORTHERWISE, BUT AN ADULT MUST ACCOMPANY YOU HOME AND STAY WITH YOU FOR 24 HOURS.  Name and phone number of your driver:  Special Instructions: N/A              Please read over the following fact sheets you were given: _____________________________________________________________________  [facility] - Preparing for Surgery Before surgery, you can play an important role.  Because skin is not sterile, your skin needs to be as free of germs as possible.  You can reduce the number of germs on your skin by washing with CHG (chlorahexidine gluconate) soap before surgery.  CHG is an antiseptic cleaner which kills germs and bonds with the skin to continue killing germs even after washing. Please DO NOT use if you have an allergy to CHG or antibacterial soaps.  If your skin becomes reddened/irritated stop using the CHG and inform your nurse when you arrive at Short Stay. Do not shave (including legs and underarms) for at least 48 hours prior to the first CHG shower.  You may shave your face/neck. Please follow these instructions carefully:  1.  Shower with CHG Soap the night before surgery and the  morning of Surgery.  2.  If you choose to wash your hair, wash your hair first as usual with your  normal  shampoo.  3.  After you shampoo, rinse your hair and body thoroughly to remove the  shampoo.                           4.  Use CHG as you would any other liquid soap.  You can apply chg directly  to the skin and wash                       Gently with a scrungie or clean washcloth.  5.  Apply the CHG Soap to your body ONLY  FROM THE NECK DOWN.   Do not use on face/ open                           Wound or open sores. Avoid contact with eyes, ears mouth and genitals (private parts).                       Wash face,  Genitals (private parts) with your normal soap.             6.  Wash thoroughly, paying special attention to the area where your surgery  will be performed.  7.  Thoroughly rinse your body with warm water from the neck down.  8.  DO NOT shower/wash with your normal soap after using and rinsing off  the CHG Soap.                9.  Fraser Din  yourself dry with a clean towel.            10.  Wear clean pajamas.            11.  Place clean sheets on your bed the night of your first shower and do not  sleep with pets. Day of Surgery : Do not apply any lotions/deodorants the morning of surgery.  Please wear clean clothes to the hospital/surgery center.  FAILURE TO FOLLOW THESE INSTRUCTIONS MAY RESULT IN THE CANCELLATION OF YOUR SURGERY PATIENT SIGNATURE_________________________________  NURSE SIGNATURE__________________________________  ________________________________________________________________________              DUE TO COVID-19 ONLY ONE VISITOR IS ALLOWED TO COME WITH YOU AND STAY IN THE WAITING ROOM ONLY DURING PRE OP AND PROCEDURE DAY OF SURGERY.  2 VISITOR  MAY VISIT WITH YOU AFTER SURGERY IN YOUR PRIVATE ROOM DURING VISITING HOURS ONLY!  YOU NEED TO HAVE A COVID 19 TEST ON______AME@_  @_from  8am-3pm _____, THIS TEST MUST BE DONE BEFORE SURGERY,  Covid test is done at [place], [place] [address].  This is a drive thru.  No appt required. Please see map.                 Your procedure is scheduled on:    Report to [facility]  Entrance   Report to admitting at AM     Call this number if you have problems the morning of surgery [phone]    REMEMBER: NO  SOLID FOOD CANDY OR GUM AFTER MIDNIGHT. CLEAR LIQUIDS UNTIL         . NOTHING BY MOUTH EXCEPT CLEAR LIQUIDS  UNTIL    . PLEASE FINISH ENSURE DRINK PER SURGEON ORDER  WHICH NEEDS TO BE COMPLETED AT      .      CLEAR LIQUID DIET   Foods Allowed                                                                    Coffee and tea, regular and decaf                            Fruit ices (not with fruit pulp)                                      Iced Popsicles                                    Carbonated beverages, regular and diet                                    Cranberry, grape and apple juices Sports drinks like Gatorade Lightly seasoned clear broth or consume(fat free) Sugar, honey syrup ___________________________________________________________________      BRUSH YOUR TEETH MORNING OF SURGERY AND RINSE YOUR MOUTH OUT, NO CHEWING GUM CANDY OR MINTS.     Take these medicines the morning of surgery with  A SIP OF WATER:   DO NOT TAKE ANY DIABETIC MEDICATIONS DAY OF YOUR SURGERY                               You may not have any metal on your body including hair pins and              piercings  Do not wear jewelry, make-up, lotions, powders or perfumes, deodorant             Do not wear nail polish on your fingernails.  Do not shave  48 hours prior to surgery.              Men may shave face and neck.   Do not bring valuables to the hospital. [place].  Contacts, dentures or bridgework may not be worn into surgery.  Leave suitcase in the car. After surgery it may be brought to your room.     Patients discharged the day of surgery will not be allowed to drive home. IF YOU ARE HAVING SURGERY AND GOING HOME THE SAME DAY, YOU MUST HAVE AN ADULT TO DRIVE YOU HOME AND BE WITH YOU FOR 24 HOURS. YOU MAY GO HOME BY TAXI OR UBER OR ORTHERWISE, BUT AN ADULT MUST ACCOMPANY YOU HOME AND STAY WITH YOU FOR 24 HOURS.  Name and phone number of your driver:  Special Instructions: N/A              Please read over the following fact sheets you were  given: _____________________________________________________________________  [facility] - Preparing for Surgery Before surgery, you can play an important role.  Because skin is not sterile, your skin needs to be as free of germs as possible.  You can reduce the number of germs on your skin by washing with CHG (chlorahexidine gluconate) soap before surgery.  CHG is an antiseptic cleaner which kills germs and bonds with the skin to continue killing germs even after washing. Please DO NOT use if you have an allergy to CHG or antibacterial soaps.  If your skin becomes reddened/irritated stop using the CHG and inform your nurse when you arrive at Short Stay. Do not shave (including legs and underarms) for at least 48 hours prior to the first CHG shower.  You may shave your face/neck. Please follow these instructions carefully:  1.  Shower with CHG Soap the night before surgery and the  morning of Surgery.  2.  If you choose to wash your hair, wash your hair first as usual with your  normal  shampoo.  3.  After you shampoo, rinse your hair and body thoroughly to remove the  shampoo.                           4.  Use CHG as you would any other liquid soap.  You can apply chg directly  to the skin and wash                       Gently with a scrungie or clean washcloth.  5.  Apply the CHG Soap to your body ONLY FROM THE NECK DOWN.   Do not use on face/ open  Wound or open sores. Avoid contact with eyes, ears mouth and genitals (private parts).                       Wash face,  Genitals (private parts) with your normal soap.             6.  Wash thoroughly, paying special attention to the area where your surgery  will be performed.  7.  Thoroughly rinse your body with warm water from the neck down.  8.  DO NOT shower/wash with your normal soap after using and rinsing off  the CHG Soap.                9.  Pat yourself dry with a clean towel.            10.  Wear clean pajamas.             11.  Place clean sheets on your bed the night of your first shower and do not  sleep with pets. Day of Surgery : Do not apply any lotions/deodorants the morning of surgery.  Please wear clean clothes to the hospital/surgery center.  FAILURE TO FOLLOW THESE INSTRUCTIONS MAY RESULT IN THE CANCELLATION OF YOUR SURGERY PATIENT SIGNATURE_________________________________  NURSE SIGNATURE__________________________________  ________________________________________________________________________

## 2021-05-19 NOTE — Progress Notes (Addendum)
Anesthesia Review:  PCP: amy Bedsole- [facility] 04/18/21  Cardiologist : NONE  Rheumatology- [place] 05/07/21 Chest x-ray : 2V- 09/09/20  EKG : 09/09/20  Echo : Stress test: Cardiac Cath :  Activity level: can do a flight of stairs without difficulty  Sleep Study/ CPAP : HAS CPAP  Fasting Blood Sugar :      / Checks Blood Sugar -- times a day:   Blood Thinner/ Instructions /Last Dose: ASA / Instructions/ Last Dose :   PT denies any diabetes or prediabetes at time of preop even though listed in hx.  Hgba1c- 05/21/21-  PT was 40 minutes late for preop appt.  LVMM at 0807 adm and at 0817am.  When pt was late for preop .    Medical and surgical hx completed along with preop instrucitons.   Weight was 243 at preop with clothes and shoes on.  Nurse normally takes off 4 lbs for clothes and shoes.  PT did not want me to take any weight off for clothes and shoes because she stated she was borderline weight wise to having insurance pay for surgery.   Covid test on 05/30/21 at 0915am

## 2021-05-21 ENCOUNTER — Other Ambulatory Visit: Payer: Self-pay

## 2021-05-21 ENCOUNTER — Encounter (HOSPITAL_COMMUNITY)
Admission: RE | Admit: 2021-05-21 | Discharge: 2021-05-21 | Disposition: A | Discharge status: Home / Self Care | Payer: Medicare Other | Source: Ambulatory Visit | Attending: Surgery | Admitting: Surgery

## 2021-05-21 ENCOUNTER — Encounter (HOSPITAL_COMMUNITY): Payer: Self-pay

## 2021-05-21 ENCOUNTER — Other Ambulatory Visit (HOSPITAL_COMMUNITY): Payer: Self-pay

## 2021-05-21 VITALS — BP 139/78 | HR 88 | Temp 98.7°F | Resp 16 | Ht 68.0 in | Wt 243.0 lb

## 2021-05-21 DIAGNOSIS — Z01818 Encounter for other preprocedural examination: Secondary | ICD-10-CM

## 2021-05-21 DIAGNOSIS — Z01812 Encounter for preprocedural laboratory examination: Secondary | ICD-10-CM | POA: Insufficient documentation

## 2021-05-21 HISTORY — DX: Anxiety disorder, unspecified: F41.9

## 2021-05-21 LAB — CBC WITH DIFFERENTIAL/PLATELET
Abs Immature Granulocytes: 0.02 10*3/uL (ref 0.00–0.07)
Basophils Absolute: 0 10*3/uL (ref 0.0–0.1)
Basophils Relative: 1 %
Eosinophils Absolute: 0.2 10*3/uL (ref 0.0–0.5)
Eosinophils Relative: 3 %
HCT: 41.4 % (ref 36.0–46.0)
Hemoglobin: 13.9 g/dL (ref 12.0–15.0)
Immature Granulocytes: 0 %
Lymphocytes Relative: 17 %
Lymphs Abs: 1.1 10*3/uL (ref 0.7–4.0)
MCH: 31.1 pg (ref 26.0–34.0)
MCHC: 33.6 g/dL (ref 30.0–36.0)
MCV: 92.6 fL (ref 80.0–100.0)
Monocytes Absolute: 0.8 10*3/uL (ref 0.1–1.0)
Monocytes Relative: 13 %
Neutro Abs: 4.3 10*3/uL (ref 1.7–7.7)
Neutrophils Relative %: 66 %
Platelets: 233 10*3/uL (ref 150–400)
RBC: 4.47 MIL/uL (ref 3.87–5.11)
RDW: 11.9 % (ref 11.5–15.5)
WBC: 6.3 10*3/uL (ref 4.0–10.5)
nRBC: 0 % (ref 0.0–0.2)

## 2021-05-21 LAB — COMPREHENSIVE METABOLIC PANEL
ALT: 27 U/L (ref 0–44)
AST: 19 U/L (ref 15–41)
Albumin: 4.3 g/dL (ref 3.5–5.0)
Alkaline Phosphatase: 101 U/L (ref 38–126)
Anion gap: 5 (ref 5–15)
BUN: 20 mg/dL (ref 8–23)
CO2: 27 mmol/L (ref 22–32)
Calcium: 9.5 mg/dL (ref 8.9–10.3)
Chloride: 103 mmol/L (ref 98–111)
Creatinine, Ser: 0.87 mg/dL (ref 0.44–1.00)
GFR, Estimated: >60 mL/min (ref >60)
Glucose, Bld: 118 mg/dL — ABNORMAL HIGH (ref 70–99)
Potassium: 4.5 mmol/L (ref 3.5–5.1)
Sodium: 135 mmol/L (ref 135–145)
Total Bilirubin: 0.3 mg/dL (ref 0.3–1.2)
Total Protein: 7.1 g/dL (ref 6.5–8.1)

## 2021-05-21 LAB — TYPE AND SCREEN
ABO/RH(D): O POS
Antibody Screen: NEGATIVE

## 2021-05-21 NOTE — Telephone Encounter (Signed)
Spoke with patient regarding status of Taltz [employer] PAP application. She will plan to submit her portion of application tomorrow via fax. Will submit provider portion tomorrow or Friday.  Knox Saliva, PharmD, MPH, BCPS Clinical Pharmacist (Rheumatology and Pulmonology)

## 2021-05-22 LAB — HEMOGLOBIN A1C
Hgb A1c MFr Bld: 6 % — ABNORMAL HIGH (ref 4.8–5.6)
Mean Plasma Glucose: 126 mg/dL

## 2021-05-26 NOTE — Telephone Encounter (Signed)
Submitted Patient Assistance Application to [employer] for TALTZ along with provider portion, PA, med list, and insurance card copy. Patient should have submitted her portion of application last week. Will update patient when we receive a response.  Fax# [phone] Phone# [phone]  Knox Saliva, PharmD, MPH, BCPS Clinical Pharmacist (Rheumatology and Pulmonology)

## 2021-05-30 ENCOUNTER — Other Ambulatory Visit: Payer: Self-pay

## 2021-05-30 ENCOUNTER — Encounter (HOSPITAL_COMMUNITY)
Admission: RE | Admit: 2021-05-30 | Discharge: 2021-05-30 | Disposition: A | Discharge status: Home / Self Care | Payer: Medicare Other | Source: Ambulatory Visit | Attending: Surgery | Admitting: Surgery

## 2021-05-30 DIAGNOSIS — Z20822 Contact with and (suspected) exposure to covid-19: Secondary | ICD-10-CM | POA: Insufficient documentation

## 2021-05-30 DIAGNOSIS — Z01812 Encounter for preprocedural laboratory examination: Secondary | ICD-10-CM | POA: Insufficient documentation

## 2021-05-30 LAB — SARS CORONAVIRUS 2 (TAT 6-24 HRS): SARS Coronavirus 2: NEGATIVE

## 2021-06-02 ENCOUNTER — Other Ambulatory Visit: Payer: Self-pay | Admitting: Family Medicine

## 2021-06-02 NOTE — Anesthesia Preprocedure Evaluation (Addendum)
Anesthesia Evaluation  ?Patient identified by MRN, date of birth, ID band ?Patient awake ? ? ? ?Reviewed: ?Allergy & Precautions, NPO status , Patient's Chart, lab work & pertinent test results ? ?Airway ?Mallampati: II ? ?TM Distance: >3 FB ?Neck ROM: Full ? ? ? Dental ?no notable dental hx. ?(+) Teeth Intact, Dental Advisory Given ?  ?Pulmonary ?sleep apnea and Continuous Positive Airway Pressure Ventilation , former smoker,  ?  ?Pulmonary exam normal ?breath sounds clear to auscultation ? ? ? ? ? ? Cardiovascular ?Exercise Tolerance: Good ?Normal cardiovascular exam ?Rhythm:Regular Rate:Normal ? ? ?  ?Neuro/Psych ?PSYCHIATRIC DISORDERS Anxiety  Neuromuscular disease   ? GI/Hepatic ?Neg liver ROS,   ?Endo/Other  ?negative endocrine ROS ? Renal/GU ?Lab Results ?     Component                Value               Date                 ?     CREATININE               0.87                05/21/2021           ?     K                        4.5                 05/21/2021           ?      ? ?  ?Musculoskeletal ? ?(+) Arthritis , Fibromyalgia - ? Abdominal ?(+) + obese (BMI 37),   ?Peds ? Hematology ?Lab Results ?     Component                Value               Date                  ?     HGB                      13.9                05/21/2021           ?     HCT                      41.4                05/21/2021               ?     PLT                      233                 05/21/2021           ? ?   ?Anesthesia Other Findings ? ? Reproductive/Obstetrics ? ?  ? ? ? ? ? ? ? ? ? ? ? ? ? ?  ?  ? ? ? ? ? ? ?Anesthesia Physical ?Anesthesia Plan ? ?ASA: 3 ? ?Anesthesia Plan: General  ? ?Post-op Pain Management: Lidocaine infusion* and Ketamine IV*  ? ?Induction: Intravenous ? ?PONV Risk Score  and Plan: 4 or greater and Treatment may vary due to age or medical condition, Midazolam, Ondansetron and Dexamethasone ? ?Airway Management Planned: Oral ETT ? ?Additional Equipment: None ? ?Intra-op  Plan:  ? ?Post-operative Plan: Extubation in OR ? ?Informed Consent: I have reviewed the patients History and Physical, chart, labs and discussed the procedure including the risks, benefits and alternatives for the proposed anesthesia with the patient or authorized representative who has indicated his/her understanding and acceptance.  ? ? ? ?Dental advisory given ? ?Plan Discussed with: CRNA and Anesthesiologist ? ?Anesthesia Plan Comments:   ? ? ? ? ? ?Anesthesia Quick Evaluation ? ?

## 2021-06-02 NOTE — Telephone Encounter (Signed)
Last office visit 04/18/2021 for Weight Management.  Last refilled 02/07/20203 for #180 with no refills.  CPE scheduled for 08/19/2021. ?

## 2021-06-03 ENCOUNTER — Inpatient Hospital Stay (HOSPITAL_COMMUNITY): Payer: Medicare Other | Admitting: Anesthesiology

## 2021-06-03 ENCOUNTER — Other Ambulatory Visit (HOSPITAL_COMMUNITY): Payer: Self-pay

## 2021-06-03 ENCOUNTER — Other Ambulatory Visit: Payer: Self-pay

## 2021-06-03 ENCOUNTER — Encounter (HOSPITAL_COMMUNITY): Payer: Self-pay | Admitting: Surgery

## 2021-06-03 ENCOUNTER — Inpatient Hospital Stay (HOSPITAL_COMMUNITY): Payer: Medicare Other | Admitting: Physician Assistant

## 2021-06-03 ENCOUNTER — Inpatient Hospital Stay (HOSPITAL_COMMUNITY)
Admission: RE | Admit: 2021-06-03 | Discharge: 2021-06-04 | DRG: 621 | Disposition: A | Discharge status: Home / Self Care | Payer: Medicare Other | Source: Ambulatory Visit | Attending: Surgery | Admitting: Surgery

## 2021-06-03 ENCOUNTER — Encounter (HOSPITAL_COMMUNITY): Admission: RE | Disposition: A | Discharge status: Home / Self Care | Payer: Self-pay | Source: Ambulatory Visit

## 2021-06-03 DIAGNOSIS — M19071 Primary osteoarthritis, right ankle and foot: Secondary | ICD-10-CM | POA: Diagnosis present

## 2021-06-03 DIAGNOSIS — F419 Anxiety disorder, unspecified: Secondary | ICD-10-CM | POA: Diagnosis present

## 2021-06-03 DIAGNOSIS — M19041 Primary osteoarthritis, right hand: Secondary | ICD-10-CM | POA: Diagnosis present

## 2021-06-03 DIAGNOSIS — Z6837 Body mass index (BMI) 37.0-37.9, adult: Secondary | ICD-10-CM | POA: Diagnosis not present

## 2021-06-03 DIAGNOSIS — Z8249 Family history of ischemic heart disease and other diseases of the circulatory system: Secondary | ICD-10-CM | POA: Diagnosis not present

## 2021-06-03 DIAGNOSIS — Z85828 Personal history of other malignant neoplasm of skin: Secondary | ICD-10-CM | POA: Diagnosis not present

## 2021-06-03 DIAGNOSIS — Z9989 Dependence on other enabling machines and devices: Secondary | ICD-10-CM | POA: Diagnosis not present

## 2021-06-03 DIAGNOSIS — Z8261 Family history of arthritis: Secondary | ICD-10-CM

## 2021-06-03 DIAGNOSIS — Z87891 Personal history of nicotine dependence: Secondary | ICD-10-CM

## 2021-06-03 DIAGNOSIS — G4733 Obstructive sleep apnea (adult) (pediatric): Secondary | ICD-10-CM

## 2021-06-03 DIAGNOSIS — Z808 Family history of malignant neoplasm of other organs or systems: Secondary | ICD-10-CM

## 2021-06-03 DIAGNOSIS — F411 Generalized anxiety disorder: Secondary | ICD-10-CM | POA: Diagnosis present

## 2021-06-03 DIAGNOSIS — E559 Vitamin D deficiency, unspecified: Secondary | ICD-10-CM | POA: Diagnosis not present

## 2021-06-03 DIAGNOSIS — K429 Umbilical hernia without obstruction or gangrene: Secondary | ICD-10-CM

## 2021-06-03 DIAGNOSIS — Z96651 Presence of right artificial knee joint: Secondary | ICD-10-CM | POA: Diagnosis present

## 2021-06-03 DIAGNOSIS — Z79899 Other long term (current) drug therapy: Secondary | ICD-10-CM | POA: Diagnosis not present

## 2021-06-03 DIAGNOSIS — R7303 Prediabetes: Secondary | ICD-10-CM | POA: Diagnosis present

## 2021-06-03 DIAGNOSIS — Z01818 Encounter for other preprocedural examination: Secondary | ICD-10-CM

## 2021-06-03 DIAGNOSIS — M797 Fibromyalgia: Secondary | ICD-10-CM | POA: Diagnosis not present

## 2021-06-03 DIAGNOSIS — M19042 Primary osteoarthritis, left hand: Secondary | ICD-10-CM | POA: Diagnosis not present

## 2021-06-03 DIAGNOSIS — Z833 Family history of diabetes mellitus: Secondary | ICD-10-CM

## 2021-06-03 DIAGNOSIS — G8918 Other acute postprocedural pain: Secondary | ICD-10-CM

## 2021-06-03 DIAGNOSIS — M19072 Primary osteoarthritis, left ankle and foot: Secondary | ICD-10-CM | POA: Diagnosis not present

## 2021-06-03 DIAGNOSIS — Z83438 Family history of other disorder of lipoprotein metabolism and other lipidemia: Secondary | ICD-10-CM

## 2021-06-03 HISTORY — PX: GASTRIC BYPASS: SHX52

## 2021-06-03 HISTORY — PX: UPPER GI ENDOSCOPY: SHX6162

## 2021-06-03 LAB — CBC
HCT: 40.5 % (ref 36.0–46.0)
Hemoglobin: 13.6 g/dL (ref 12.0–15.0)
MCH: 31.2 pg (ref 26.0–34.0)
MCHC: 33.6 g/dL (ref 30.0–36.0)
MCV: 92.9 fL (ref 80.0–100.0)
Platelets: 229 10*3/uL (ref 150–400)
RBC: 4.36 MIL/uL (ref 3.87–5.11)
RDW: 12 % (ref 11.5–15.5)
WBC: 10.8 10*3/uL — ABNORMAL HIGH (ref 4.0–10.5)
nRBC: 0 % (ref 0.0–0.2)

## 2021-06-03 LAB — CREATININE, SERUM
Creatinine, Ser: 0.9 mg/dL (ref 0.44–1.00)
GFR, Estimated: >60 mL/min (ref >60)

## 2021-06-03 LAB — HEMOGLOBIN AND HEMATOCRIT, BLOOD
HCT: 39.8 % (ref 36.0–46.0)
Hemoglobin: 13.7 g/dL (ref 12.0–15.0)

## 2021-06-03 SURGERY — XI ROBOTIC GASTRIC SLEEVE RESECTION
Anesthesia: General | Site: Esophagus

## 2021-06-03 MED ORDER — LACTATED RINGERS IV SOLN: INTRAVENOUS | Status: DC

## 2021-06-03 MED ORDER — ALBUMIN HUMAN 5 % IV SOLN
INTRAVENOUS | Status: AC
  Filled 2021-06-03: qty 250

## 2021-06-03 MED ORDER — LACTATED RINGERS IV SOLN: INTRAVENOUS | Status: DC | PRN

## 2021-06-03 MED ORDER — SIMETHICONE 80 MG PO CHEW
80.0000 mg | CHEWABLE_TABLET | Freq: Four times a day (QID) | ORAL | Status: DC | PRN
  Administered 2021-06-04: 80 mg via ORAL
  Filled 2021-06-03: qty 1

## 2021-06-03 MED ORDER — LABETALOL HCL 5 MG/ML IV SOLN
5.0000 mg | Freq: Once | INTRAVENOUS | Status: AC
  Administered 2021-06-03: 5 mg via INTRAVENOUS

## 2021-06-03 MED ORDER — LIDOCAINE 2% (20 MG/ML) 5 ML SYRINGE
INTRAMUSCULAR | Status: DC | PRN
Start: 2021-06-03 — End: 2021-06-03
  Administered 2021-06-03: 100 mg via INTRAVENOUS

## 2021-06-03 MED ORDER — FENTANYL CITRATE PF 50 MCG/ML IJ SOSY
PREFILLED_SYRINGE | INTRAMUSCULAR | Status: AC
  Filled 2021-06-03: qty 2

## 2021-06-03 MED ORDER — ENOXAPARIN SODIUM 30 MG/0.3ML IJ SOSY
30.0000 mg | PREFILLED_SYRINGE | Freq: Two times a day (BID) | INTRAMUSCULAR | Status: DC
  Administered 2021-06-03 – 2021-06-04 (×2): 30 mg via SUBCUTANEOUS
  Filled 2021-06-03 (×2): qty 0.3

## 2021-06-03 MED ORDER — BUPIVACAINE LIPOSOME 1.3 % IJ SUSP
INTRAMUSCULAR | Status: AC
  Filled 2021-06-03: qty 20

## 2021-06-03 MED ORDER — ACETAMINOPHEN 500 MG PO TABS: 1000.0000 mg | ORAL_TABLET | Freq: Three times a day (TID) | ORAL | 0 refills | Status: AC

## 2021-06-03 MED ORDER — ONDANSETRON HCL 4 MG/2ML IJ SOLN: 4.0000 mg | Freq: Once | INTRAMUSCULAR | Status: DC | PRN

## 2021-06-03 MED ORDER — ONDANSETRON HCL 4 MG/2ML IJ SOLN
4.0000 mg | INTRAMUSCULAR | Status: DC | PRN
  Administered 2021-06-04: 4 mg via INTRAVENOUS
  Filled 2021-06-03: qty 2

## 2021-06-03 MED ORDER — ONDANSETRON 4 MG PO TBDP
4.0000 mg | ORAL_TABLET | Freq: Four times a day (QID) | ORAL | 0 refills | Status: DC | PRN
  Filled 2021-06-03: qty 20, 5d supply, fill #0

## 2021-06-03 MED ORDER — PROPOFOL 10 MG/ML IV BOLUS
INTRAVENOUS | Status: DC | PRN
Start: 2021-06-03 — End: 2021-06-03
  Administered 2021-06-03: 50 mg via INTRAVENOUS
  Administered 2021-06-03: 150 mg via INTRAVENOUS

## 2021-06-03 MED ORDER — BUPIVACAINE-EPINEPHRINE 0.25% -1:200000 IJ SOLN
INTRAMUSCULAR | Status: DC | PRN
  Administered 2021-06-03: 30 mL

## 2021-06-03 MED ORDER — CHLORHEXIDINE GLUCONATE CLOTH 2 % EX PADS: 6.0000 | MEDICATED_PAD | Freq: Once | CUTANEOUS | Status: AC

## 2021-06-03 MED ORDER — 0.9 % SODIUM CHLORIDE (POUR BTL) OPTIME
TOPICAL | Status: DC | PRN
  Administered 2021-06-03: 1000 mL

## 2021-06-03 MED ORDER — APREPITANT 40 MG PO CAPS
40.0000 mg | ORAL_CAPSULE | ORAL | Status: AC
  Administered 2021-06-03: 40 mg via ORAL
  Filled 2021-06-03: qty 1

## 2021-06-03 MED ORDER — FENTANYL CITRATE PF 50 MCG/ML IJ SOSY
PREFILLED_SYRINGE | INTRAMUSCULAR | Status: AC
  Filled 2021-06-03: qty 1

## 2021-06-03 MED ORDER — ENSURE MAX PROTEIN PO LIQD
2.0000 [oz_av] | ORAL | Status: DC
  Administered 2021-06-04 (×2): 2 [oz_av] via ORAL

## 2021-06-03 MED ORDER — PREGABALIN 75 MG PO CAPS
150.0000 mg | ORAL_CAPSULE | Freq: Two times a day (BID) | ORAL | Status: DC
Start: 2021-06-03 — End: 2021-06-04
  Administered 2021-06-03 – 2021-06-04 (×2): 150 mg via ORAL
  Filled 2021-06-03 (×2): qty 2

## 2021-06-03 MED ORDER — ACETAMINOPHEN 500 MG PO TABS
1000.0000 mg | ORAL_TABLET | ORAL | Status: AC
  Administered 2021-06-03: 1000 mg via ORAL
  Filled 2021-06-03: qty 2

## 2021-06-03 MED ORDER — KETOROLAC TROMETHAMINE 15 MG/ML IJ SOLN
15.0000 mg | Freq: Once | INTRAMUSCULAR | Status: AC | PRN
  Administered 2021-06-03: 15 mg via INTRAVENOUS

## 2021-06-03 MED ORDER — DEXAMETHASONE SODIUM PHOSPHATE 10 MG/ML IJ SOLN
INTRAMUSCULAR | Status: AC
  Filled 2021-06-03: qty 1

## 2021-06-03 MED ORDER — OXYCODONE HCL 5 MG PO TABS
5.0000 mg | ORAL_TABLET | Freq: Four times a day (QID) | ORAL | 0 refills | Status: DC | PRN
  Filled 2021-06-03: qty 10, 3d supply, fill #0

## 2021-06-03 MED ORDER — ROCURONIUM BROMIDE 10 MG/ML (PF) SYRINGE
PREFILLED_SYRINGE | INTRAVENOUS | Status: AC
  Filled 2021-06-03: qty 10

## 2021-06-03 MED ORDER — FENTANYL CITRATE (PF) 250 MCG/5ML IJ SOLN
INTRAMUSCULAR | Status: DC | PRN
  Administered 2021-06-03 (×2): 50 ug via INTRAVENOUS
  Administered 2021-06-03: 100 ug via INTRAVENOUS
  Administered 2021-06-03: 50 ug via INTRAVENOUS

## 2021-06-03 MED ORDER — PANTOPRAZOLE SODIUM 40 MG PO TBEC
40.0000 mg | DELAYED_RELEASE_TABLET | Freq: Every day | ORAL | 0 refills | Status: DC
  Filled 2021-06-03: qty 90, 90d supply, fill #0

## 2021-06-03 MED ORDER — OXYCODONE HCL 5 MG/5ML PO SOLN
5.0000 mg | Freq: Four times a day (QID) | ORAL | Status: DC | PRN
  Administered 2021-06-03 – 2021-06-04 (×3): 5 mg via ORAL
  Filled 2021-06-03 (×3): qty 5

## 2021-06-03 MED ORDER — BUPROPION HCL ER (XL) 150 MG PO TB24
150.0000 mg | ORAL_TABLET | Freq: Every day | ORAL | Status: DC
  Administered 2021-06-04: 150 mg via ORAL
  Filled 2021-06-03: qty 1

## 2021-06-03 MED ORDER — KETAMINE HCL 10 MG/ML IJ SOLN
INTRAMUSCULAR | Status: DC | PRN
Start: 2021-06-03 — End: 2021-06-03
  Administered 2021-06-03: 50 mg via INTRAVENOUS

## 2021-06-03 MED ORDER — KETOROLAC TROMETHAMINE 15 MG/ML IJ SOLN: 15.0000 mg | Freq: Once | INTRAMUSCULAR | Status: DC | PRN

## 2021-06-03 MED ORDER — ENOXAPARIN SODIUM 40 MG/0.4ML IJ SOSY
40.0000 mg | PREFILLED_SYRINGE | INTRAMUSCULAR | Status: AC
  Administered 2021-06-03: 40 mg via SUBCUTANEOUS
  Filled 2021-06-03: qty 0.4

## 2021-06-03 MED ORDER — ONDANSETRON HCL 4 MG/2ML IJ SOLN
INTRAMUSCULAR | Status: AC
  Filled 2021-06-03: qty 2

## 2021-06-03 MED ORDER — STERILE WATER FOR IRRIGATION IR SOLN
Status: DC | PRN
  Administered 2021-06-03: 1000 mL

## 2021-06-03 MED ORDER — BUPIVACAINE-EPINEPHRINE (PF) 0.25% -1:200000 IJ SOLN
INTRAMUSCULAR | Status: AC
  Filled 2021-06-03: qty 30

## 2021-06-03 MED ORDER — LACTATED RINGERS IR SOLN
Status: DC | PRN
  Administered 2021-06-03: 1000 mL

## 2021-06-03 MED ORDER — ACETAMINOPHEN 160 MG/5ML PO SOLN: 1000.0000 mg | Freq: Three times a day (TID) | ORAL | Status: DC

## 2021-06-03 MED ORDER — PROPOFOL 10 MG/ML IV BOLUS
INTRAVENOUS | Status: AC
  Filled 2021-06-03: qty 20

## 2021-06-03 MED ORDER — ACETAMINOPHEN 500 MG PO TABS
1000.0000 mg | ORAL_TABLET | Freq: Three times a day (TID) | ORAL | Status: DC
  Administered 2021-06-03 – 2021-06-04 (×2): 1000 mg via ORAL
  Filled 2021-06-03 (×2): qty 2

## 2021-06-03 MED ORDER — FENTANYL CITRATE PF 50 MCG/ML IJ SOSY: 25.0000 ug | PREFILLED_SYRINGE | INTRAMUSCULAR | Status: DC | PRN

## 2021-06-03 MED ORDER — FENTANYL CITRATE (PF) 250 MCG/5ML IJ SOLN
INTRAMUSCULAR | Status: AC
  Filled 2021-06-03: qty 5

## 2021-06-03 MED ORDER — FENTANYL CITRATE PF 50 MCG/ML IJ SOSY
25.0000 ug | PREFILLED_SYRINGE | INTRAMUSCULAR | Status: DC | PRN
  Administered 2021-06-03: 25 ug via INTRAVENOUS
  Administered 2021-06-03 (×2): 50 ug via INTRAVENOUS
  Administered 2021-06-03: 25 ug via INTRAVENOUS

## 2021-06-03 MED ORDER — DULOXETINE HCL 30 MG PO CPEP
90.0000 mg | ORAL_CAPSULE | Freq: Every day | ORAL | Status: DC
  Administered 2021-06-04: 90 mg via ORAL
  Filled 2021-06-03: qty 1

## 2021-06-03 MED ORDER — BUPIVACAINE LIPOSOME 1.3 % IJ SUSP: 20.0000 mL | Freq: Once | INTRAMUSCULAR | Status: AC

## 2021-06-03 MED ORDER — KETOROLAC TROMETHAMINE 15 MG/ML IJ SOLN
INTRAMUSCULAR | Status: AC
  Filled 2021-06-03: qty 1

## 2021-06-03 MED ORDER — PANTOPRAZOLE SODIUM 40 MG IV SOLR
40.0000 mg | Freq: Every day | INTRAVENOUS | Status: DC
  Administered 2021-06-03: 40 mg via INTRAVENOUS
  Filled 2021-06-03: qty 10

## 2021-06-03 MED ORDER — SODIUM CHLORIDE 0.9 % IV SOLN
2.0000 g | INTRAVENOUS | Status: AC
  Administered 2021-06-03: 2 g via INTRAVENOUS
  Filled 2021-06-03: qty 2

## 2021-06-03 MED ORDER — LABETALOL HCL 5 MG/ML IV SOLN
INTRAVENOUS | Status: AC
  Filled 2021-06-03: qty 4

## 2021-06-03 MED ORDER — MIDAZOLAM HCL 2 MG/2ML IJ SOLN
INTRAMUSCULAR | Status: DC | PRN
  Administered 2021-06-03: 2 mg via INTRAVENOUS

## 2021-06-03 MED ORDER — SUGAMMADEX SODIUM 500 MG/5ML IV SOLN
INTRAVENOUS | Status: DC | PRN
  Administered 2021-06-03: 250 mg via INTRAVENOUS

## 2021-06-03 MED ORDER — SUGAMMADEX SODIUM 500 MG/5ML IV SOLN
INTRAVENOUS | Status: AC
  Filled 2021-06-03: qty 5

## 2021-06-03 MED ORDER — MIDAZOLAM HCL 2 MG/2ML IJ SOLN
INTRAMUSCULAR | Status: AC
  Filled 2021-06-03: qty 2

## 2021-06-03 MED ORDER — DEXAMETHASONE SODIUM PHOSPHATE 10 MG/ML IJ SOLN
INTRAMUSCULAR | Status: DC | PRN
  Administered 2021-06-03: 10 mg via INTRAVENOUS

## 2021-06-03 MED ORDER — LIDOCAINE 2% (20 MG/ML) 5 ML SYRINGE
INTRAMUSCULAR | Status: DC | PRN
Start: 2021-06-03 — End: 2021-06-03
  Administered 2021-06-03: 1.5 mg/kg/h via INTRAVENOUS

## 2021-06-03 MED ORDER — MORPHINE SULFATE (PF) 2 MG/ML IV SOLN: 1.0000 mg | INTRAVENOUS | Status: DC | PRN

## 2021-06-03 MED ORDER — ROCURONIUM BROMIDE 10 MG/ML (PF) SYRINGE
PREFILLED_SYRINGE | INTRAVENOUS | Status: DC | PRN
  Administered 2021-06-03: 70 mg via INTRAVENOUS
  Administered 2021-06-03: 10 mg via INTRAVENOUS

## 2021-06-03 MED ORDER — ONDANSETRON HCL 4 MG/2ML IJ SOLN
INTRAMUSCULAR | Status: DC | PRN
  Administered 2021-06-03: 4 mg via INTRAVENOUS

## 2021-06-03 MED ORDER — LIDOCAINE HCL 2 % IJ SOLN
INTRAMUSCULAR | Status: AC
  Filled 2021-06-03: qty 20

## 2021-06-03 MED ORDER — BUPIVACAINE LIPOSOME 1.3 % IJ SUSP
INTRAMUSCULAR | Status: DC | PRN
  Administered 2021-06-03: 20 mL

## 2021-06-03 MED ORDER — METHOCARBAMOL 500 MG PO TABS: 500.0000 mg | ORAL_TABLET | Freq: Three times a day (TID) | ORAL | Status: DC | PRN

## 2021-06-03 MED ORDER — KETAMINE HCL 50 MG/5ML IJ SOSY
PREFILLED_SYRINGE | INTRAMUSCULAR | Status: AC
  Filled 2021-06-03: qty 5

## 2021-06-03 MED ORDER — LIDOCAINE HCL (PF) 2 % IJ SOLN
INTRAMUSCULAR | Status: AC
  Filled 2021-06-03: qty 5

## 2021-06-03 SURGICAL SUPPLY — 81 items
ADH SKN CLS APL DERMABOND .7 (GAUZE/BANDAGES/DRESSINGS) ×2
APL PRP STRL LF DISP 70% ISPRP (MISCELLANEOUS) ×4
APPLIER CLIP 5 13 M/L LIGAMAX5 (MISCELLANEOUS)
APPLIER CLIP ROT 10 11.4 M/L (STAPLE)
APR CLP MED LRG 11.4X10 (STAPLE)
APR CLP MED LRG 5 ANG JAW (MISCELLANEOUS)
BAG COUNTER SPONGE SURGICOUNT (BAG) ×4 IMPLANT
BAG SPNG CNTER NS LX DISP (BAG) ×2
BAG SURGICOUNT SPONGE COUNTING (BAG) ×1
BLADE SURG SZ11 CARB STEEL (BLADE) ×5 IMPLANT
CANNULA REDUC XI 12-8 STAPL (CANNULA) ×3
CANNULA REDUC XI 12-8MM STAPL (CANNULA) ×1
CANNULA REDUCER 12-8 DVNC XI (CANNULA) ×3 IMPLANT
CHLORAPREP W/TINT 26 (MISCELLANEOUS) ×10 IMPLANT
CLIP APPLIE 5 13 M/L LIGAMAX5 (MISCELLANEOUS) IMPLANT
CLIP APPLIE ROT 10 11.4 M/L (STAPLE) IMPLANT
COVER SURGICAL LIGHT HANDLE (MISCELLANEOUS) ×5 IMPLANT
COVER TIP SHEARS 8 DVNC (MISCELLANEOUS) IMPLANT
COVER TIP SHEARS 8MM DA VINCI (MISCELLANEOUS)
DERMABOND ADVANCED (GAUZE/BANDAGES/DRESSINGS) ×2
DERMABOND ADVANCED .7 DNX12 (GAUZE/BANDAGES/DRESSINGS) ×3 IMPLANT
DRAPE ARM DVNC X/XI (DISPOSABLE) ×12 IMPLANT
DRAPE COLUMN DVNC XI (DISPOSABLE) ×3 IMPLANT
DRAPE DA VINCI XI ARM (DISPOSABLE) ×16
DRAPE DA VINCI XI COLUMN (DISPOSABLE) ×4
ELECT REM PT RETURN 15FT ADLT (MISCELLANEOUS) ×5 IMPLANT
GAUZE 4X4 16PLY ~~LOC~~+RFID DBL (SPONGE) ×5 IMPLANT
GLOVE SURG ENC MOIS LTX SZ7.5 (GLOVE) ×10 IMPLANT
GLOVE SURG UNDER LTX SZ8 (GLOVE) ×10 IMPLANT
GOWN STRL REUS W/ TWL XL LVL3 (GOWN DISPOSABLE) IMPLANT
GOWN STRL REUS W/TWL XL LVL3 (GOWN DISPOSABLE) ×10 IMPLANT
GRASPER SUT TROCAR 14GX15 (MISCELLANEOUS) ×5 IMPLANT
HEMOSTAT SNOW SURGICEL 2X4 (HEMOSTASIS) IMPLANT
IRRIG SUCT STRYKERFLOW 2 WTIP (MISCELLANEOUS) ×4
IRRIGATION SUCT STRKRFLW 2 WTP (MISCELLANEOUS) ×3 IMPLANT
KIT BASIN OR (CUSTOM PROCEDURE TRAY) ×5 IMPLANT
KIT TURNOVER KIT A (KITS) ×2 IMPLANT
LUBRICANT JELLY K Y 4OZ (MISCELLANEOUS) IMPLANT
MARKER SKIN DUAL TIP RULER LAB (MISCELLANEOUS) IMPLANT
MAT PREVALON FULL STRYKER (MISCELLANEOUS) ×5 IMPLANT
NDL SPNL 18GX3.5 QUINCKE PK (NEEDLE) ×3 IMPLANT
NEEDLE SPNL 18GX3.5 QUINCKE PK (NEEDLE) ×4 IMPLANT
OBTURATOR OPTICAL STANDARD 8MM (TROCAR) ×4
OBTURATOR OPTICAL STND 8 DVNC (TROCAR) ×2
OBTURATOR OPTICALSTD 8 DVNC (TROCAR) ×3 IMPLANT
PACK CARDIOVASCULAR III (CUSTOM PROCEDURE TRAY) ×5 IMPLANT
RELOAD STAPLE 60 2.5 WHT DVNC (STAPLE) IMPLANT
RELOAD STAPLE 60 3.5 BLU DVNC (STAPLE) IMPLANT
RELOAD STAPLER 2.5X60 WHT DVNC (STAPLE) ×6 IMPLANT
RELOAD STAPLER 3.5X60 BLU DVNC (STAPLE) ×4 IMPLANT
SCISSORS LAP 5X35 DISP (ENDOMECHANICALS) IMPLANT
SEAL CANN UNIV 5-8 DVNC XI (MISCELLANEOUS) ×9 IMPLANT
SEAL XI 5MM-8MM UNIVERSAL (MISCELLANEOUS) ×12
SEALER SYNCHRO 8 IS4000 DV (MISCELLANEOUS) ×4
SEALER SYNCHRO 8 IS4000 DVNC (MISCELLANEOUS) ×3 IMPLANT
SLEEVE GASTRECTOMY 40FR VISIGI (MISCELLANEOUS) ×2 IMPLANT
SOL ANTI FOG 6CC (MISCELLANEOUS) ×3 IMPLANT
SOLUTION ANTI FOG 6CC (MISCELLANEOUS) ×2
SOLUTION ELECTROLUBE (MISCELLANEOUS) ×5 IMPLANT
SPIKE FLUID TRANSFER (MISCELLANEOUS) ×5 IMPLANT
STAPLER 60 DA VINCI SURE FORM (STAPLE) ×4
STAPLER 60 SUREFORM DVNC (STAPLE) ×3 IMPLANT
STAPLER CANNULA SEAL DVNC XI (STAPLE) ×3 IMPLANT
STAPLER CANNULA SEAL XI (STAPLE) ×4
STAPLER RELOAD 2.5X60 WHITE (STAPLE) ×12
STAPLER RELOAD 2.5X60 WHT DVNC (STAPLE) ×6
STAPLER RELOAD 3.5X60 BLU DVNC (STAPLE) ×4
STAPLER RELOAD 3.5X60 BLUE (STAPLE) ×8
SUT MNCRL AB 4-0 PS2 18 (SUTURE) ×10 IMPLANT
SUT PDS AB 2-0 CT2 27 (SUTURE) ×4 IMPLANT
SUT VIC AB 0 CT1 27 (SUTURE) ×8
SUT VIC AB 0 CT1 27XBRD ANTBC (SUTURE) ×3 IMPLANT
SUT VIC AB 2-0 SH 27 (SUTURE) ×4
SUT VIC AB 2-0 SH 27XBRD (SUTURE) IMPLANT
SYR 20ML LL LF (SYRINGE) ×5 IMPLANT
TOWEL OR 17X26 10 PK STRL BLUE (TOWEL DISPOSABLE) ×5 IMPLANT
TRAY FOLEY MTR SLVR 16FR STAT (SET/KITS/TRAYS/PACK) IMPLANT
TROCAR ADV FIXATION 12X100MM (TROCAR) IMPLANT
TROCAR Z-THREAD FIOS 5X100MM (TROCAR) ×5 IMPLANT
TUBE CALIBRATION LAPBAND (TUBING) IMPLANT
TUBING INSUFFLATION 10FT LAP (TUBING) ×5 IMPLANT

## 2021-06-03 NOTE — H&P (Signed)
? ?Admitting Physician: Nickola Major Abu Heavin ? ?Service: Bariatric surgery ? ?CC: Obesity ? ?Subjective  ? ?HPI: ?Jillian [name] is an 66 y.o. female who is here for elective sleeve gastrectomy ? ?Past Medical History:  ?Diagnosis Date  ? Anxiety   ? Cancer [facility])   ? basal cell 2016  ? Dermatitis   ? per patient   ? Fibromyalgia   ? Primary localized osteoarthritis of right knee 02/07/2019  ? Sleep apnea   ? uses CPAP  ? ? ?Past Surgical History:  ?Procedure Laterality Date  ? BIOPSY  02/05/2021  ? Procedure: BIOPSY;  Surgeon: Felicie Morn, MD;  Location: [facility];  Service: General;;  ? CESAREAN SECTION    ? times 2  ? ESOPHAGOGASTRODUODENOSCOPY N/A 02/05/2021  ? Procedure: ESOPHAGOGASTRODUODENOSCOPY (EGD);  Surgeon: Felicie Morn, MD;  Location: [facility];  Service: General;  Laterality: N/A;  ? EYE SURGERY Bilateral 04/24/2020  ? bilateral eyelids   ? HERNIA REPAIR    ? PARTIAL KNEE ARTHROPLASTY Right 02/07/2019  ? Procedure: UNICOMPARTMENTAL KNEE;  Surgeon: Marchia Bond, MD;  Location: [facility];  Service: Orthopedics;  Laterality: Right;  ? PARTIAL KNEE ARTHROPLASTY Left 03/21/2019  ? Procedure: UNICOMPARTMENTAL KNEE;  Surgeon: Marchia Bond, MD;  Location: [facility];  Service: Orthopedics;  Laterality: Left;  ? UPPER GI ENDOSCOPY  03/2021  ? ? ?Family History  ?Problem Relation Age of Onset  ? Cancer Father   ?     colon  ? Hyperlipidemia Father   ? Hypertension Father   ? Diabetes Sister   ? Cancer Paternal Aunt   ?     colon  ? Cancer Maternal Grandmother   ?     colon  ? Hypertension Maternal Grandmother   ? Anuerysm Paternal Grandfather   ? Psoriasis Brother   ? Arthritis Brother   ? Psoriasis Sister   ? Osteoarthritis Brother   ? Diabetes Brother   ? Throat cancer Brother   ? Healthy Son   ? Healthy Daughter   ? ? ?Social:  reports that she quit smoking about 29 years ago. Her smoking use included cigarettes. She has a 15.00 pack-year smoking history. She has been exposed to tobacco  smoke. She has never used smokeless tobacco. She reports that she does not drink alcohol and does not use drugs. ? ?Allergies: No Known Allergies ? ?Medications: ?Current Outpatient Medications  ?Medication Instructions  ? augmented betamethasone dipropionate (DIPROLENE-AF) 0.05 % ointment Topical, 2 times daily PRN  ? buPROPion (WELLBUTRIN XL) 150 MG 24 hr tablet TAKE 1 TABLET BY MOUTH  DAILY  ? Co Q 10 100 mg, Daily  ? DULoxetine (CYMBALTA) 30 MG capsule TAKE 3 CAPSULES BY MOUTH  DAILY  ? fluocinonide (LIDEX) 0.05 % external solution 1 application., Topical, 2 times daily PRN  ? Magnesium Malate 1250 (141.7 Mg) MG TABS 1 tablet, 3 times daily  ? methocarbamol (ROBAXIN) 500 MG tablet TAKE 1 TABLET BY MOUTH 3  TIMES DAILY AS NEEDED FOR  MUSCLE SPASMS  ? pregabalin (LYRICA) 150 MG capsule TAKE 1 CAPSULE BY MOUTH  TWICE DAILY  ? traMADol (ULTRAM) 50 MG tablet TAKE 2 TABLETS BY MOUTH 3 TIMES  DAILY  ? Tremfya 100 mg, Subcutaneous, Every 8 weeks  ? Vitamin D, Ergocalciferol, (DRISDOL) 1.25 MG (50000 UNIT) CAPS capsule TAKE 1 CAPSULE BY MOUTH  EVERY 7 DAYS  ? ? ?ROS - all of the below systems have been reviewed with the patient and positives are indicated with  bold text ?General: chills, fever or night sweats ?Eyes: blurry vision or double vision ?ENT: epistaxis or sore throat ?Allergy/Immunology: itchy/watery eyes or nasal congestion ?Hematologic/Lymphatic: bleeding problems, blood clots or swollen lymph nodes ?Endocrine: temperature intolerance or unexpected weight changes ?Breast: new or changing breast lumps or nipple discharge ?Resp: cough, shortness of breath, or wheezing ?CV: chest pain or dyspnea on exertion ?GI: as per HPI ?GU: dysuria, trouble voiding, or hematuria ?MSK: joint pain or joint stiffness ?Neuro: TIA or stroke symptoms ?Derm: pruritus and skin lesion changes ?Psych: anxiety and depression ? ?Objective  ? ?PE ?Blood pressure 129/77, pulse 83, temperature 98.8 ?F (37.1 ?C), temperature source Oral,  resp. rate 18, height '5\' 8"'$  (1.727 m), weight 111.1 kg, SpO2 96 %. ?Constitutional: NAD; conversant; no deformities ?Eyes: Moist conjunctiva; no lid lag; anicteric; PERRL ?Neck: Trachea midline; no thyromegaly ?Lungs: Normal respiratory effort; no tactile fremitus ?CV: RRR; no palpable thrills; no pitting edema ?GI: Abd Soft, nontender; no palpable hepatosplenomegaly ?MSK: Normal range of motion of extremities; no clubbing/cyanosis ?Psychiatric: Appropriate affect; alert and oriented x3 ?Lymphatic: No palpable cervical or axillary lymphadenopathy ? ?No results found for this or any previous visit (from the past 24 hour(s)). ? ?Imaging Orders  ?No imaging studies ordered today  ? ? ? ?Assessment and Plan  ? ?Jillian [name] is a 66 year old female who presented for bariatric surgery consultation  Her BMI is 37.25 and she has a history of sleep apnea. I reviewed the metabolic surgeries we offer here including a robotic sleeve gastrectomy and robotic gastric bypass. We reviewed in detail the procedures themselves as well as the risks, benefits, and alternatives. I entered her information into the [facility] metabolic surgery risk/benefit calculator to aid in this discussion. After full discussion the patient is interested in proceeding with sleeve gastrectomy. We will begin the preoperative pathway to include the following ?- Bloodwork ?- Dietitian consult - Completed with Sandie Ano, RD ?- Psychology consult ?- Chest x-ray 09/09/20 - No active cardiopulmonary disease ?- EKG 09/09/20 - normal NSR ?- Upper endoscopy 02/05/22 ?- Normal esophagus. ?- Gastroesophageal flap valve classified as Hill Grade II (fold present, opens with respiration). ?- Normal stomach. Biopsied. ?- Z-line irregular. ?- Normal examined duodenum. ?  - Biopsy negative for h. Pylori ? ?Today she presents for robotic sleeve gastrectomy.  We again discussed the risks, benefits and alternatives.  After a full discussion and all questions answered the patient  granted consent to proceed.  We will proceed as scheduled. ? ? ?Felicie Morn, MD ? ?[facility] ?Use [email or web address] to contact on call provider ? ? ? ?

## 2021-06-03 NOTE — Anesthesia Procedure Notes (Signed)
Procedure Name: Intubation ?Date/Time: 06/03/2021 12:24 PM ?Performed by: Sharlette Dense, CRNA ?Pre-anesthesia Checklist: Patient identified, Emergency Drugs available, Suction available and Patient being monitored ?Patient Re-evaluated:Patient Re-evaluated prior to induction ?Oxygen Delivery Method: Circle system utilized ?Preoxygenation: Pre-oxygenation with 100% oxygen ?Induction Type: IV induction ?Ventilation: Mask ventilation without difficulty ?Laryngoscope Size: Sabra Heck and 2 ?Grade View: Grade II ?Tube type: Oral ?Tube size: 7.5 mm ?Number of attempts: 2 ?Airway Equipment and Method: Stylet and Oral airway ?Placement Confirmation: ETT inserted through vocal cords under direct vision, positive ETCO2 and breath sounds checked- equal and bilateral ?Secured at: 22 cm ?Tube secured with: Tape ?Dental Injury: Teeth and Oropharynx as per pre-operative assessment  ? ? ? ? ?

## 2021-06-03 NOTE — Transfer of Care (Signed)
Immediate Anesthesia Transfer of Care Note ? ?Patient: Jillian [name] ? ?Procedure(s) Performed: XI ROBOTIC GASTRIC SLEEVE RESECTION WITH A PRIMARY UMBILICAL HERNIA REPAIR (Abdomen) ?UPPER GI ENDOSCOPY (Esophagus) ? ?Patient Location: PACU ? ?Anesthesia Type:General ? ?Level of Consciousness: drowsy ? ?Airway & Oxygen Therapy: Patient Spontanous Breathing and Patient connected to face mask oxygen ? ?Post-op Assessment: Report given to RN and Post -op Vital signs reviewed and stable ? ?Post vital signs: Reviewed and stable ? ?Last Vitals:  ?Vitals Value Taken Time  ?BP 160/80 06/03/21 1427  ?Temp 37 ?C 06/03/21 1427  ?Pulse 103 06/03/21 1428  ?Resp 23 06/03/21 1428  ?SpO2 92 % 06/03/21 1428  ?Vitals shown include unvalidated device data. ? ?Last Pain:  ?Vitals:  ? 06/03/21 0934  ?TempSrc:   ?PainSc: 0-No pain  ?   ? ?  ? ?Complications: No notable events documented. ?

## 2021-06-03 NOTE — Progress Notes (Signed)
PHARMACY CONSULT FOR:  Risk Assessment for Post-Discharge VTE Following Bariatric Surgery ? ?Post-Discharge VTE Risk Assessment: ?This patient's probability of 30-day post-discharge VTE is increased due to the factors marked: ?X Sleeve gastrectomy  ? Liver disorder (transplant, cirrhosis, or nonalcoholic steatohepatitis)  ? Hx of VTE  ? Hemorrhage requiring transfusion  ? GI perforation, leak, or obstruction  ? ====================================================  ?  Female  ?X  Age >/=60 years  ?  BMI >/=50 kg/m2  ?  CHF  ?  Dyspnea at Rest  ?  Paraplegia  ? X Non-gastric-band surgery  ?  Operation Time >/=3 hr  ?  Return to OR   ?  Length of Stay >/= 3 d  ? Hypercoagulable condition  ? Significant venous stasis  ? ? ? ? ?Predicted probability of 30-day post-discharge VTE: 0.31% ? ?Other patient-specific factors to consider: no ? ?Recommendation for Discharge: ?No pharmacologic prophylaxis post-discharge ? ? ?Jillian [name] is a 66 y.o. female who underwent XI ROBOTIC GASTRIC SLEEVE RESECTION 06/03/2021 ? ? ?No Known Allergies ? ?Patient Measurements: ?Height: '5\' 8"'$  (172.7 cm) ?Weight: 111.1 kg (245 lb) ?IBW/kg (Calculated) : 63.9 ?Body mass index is 37.25 kg/m?. ? ?No results for input(s): WBC, HGB, HCT, PLT, APTT, CREATININE, LABCREA, CREATININE, CREAT24HRUR, MG, PHOS, ALBUMIN, PROT, ALBUMIN, AST, ALT, ALKPHOS, BILITOT, BILIDIR, IBILI in the last 72 hours. ?Estimated Creatinine Clearance: 83.1 mL/min (by C-G formula based on SCr of 0.87 mg/dL). ? ? ? ?Past Medical History:  ?Diagnosis Date  ? Anxiety   ? Cancer [facility])   ? basal cell 2016  ? Dermatitis   ? per patient   ? Fibromyalgia   ? Primary localized osteoarthritis of right knee 02/07/2019  ? Sleep apnea   ? uses CPAP  ? ? ? ?Medications Prior to Admission  ?Medication Sig Dispense Refill Last Dose  ? augmented betamethasone dipropionate (DIPROLENE-AF) 0.05 % ointment Apply topically 2 (two) times daily as needed. 50 g 0 Past Week  ? buPROPion (WELLBUTRIN XL)  150 MG 24 hr tablet TAKE 1 TABLET BY MOUTH  DAILY 90 tablet 1 06/03/2021  ? DULoxetine (CYMBALTA) 30 MG capsule TAKE 3 CAPSULES BY MOUTH  DAILY 270 capsule 1 06/03/2021  ? fluocinonide (LIDEX) 0.05 % external solution Apply 1 application topically 2 (two) times daily as needed (PSORIASIS). 60 mL 2 Past Week  ? pregabalin (LYRICA) 150 MG capsule TAKE 1 CAPSULE BY MOUTH  TWICE DAILY 180 capsule 0 06/03/2021  ? traMADol (ULTRAM) 50 MG tablet TAKE 2 TABLETS BY MOUTH 3 TIMES  DAILY 180 tablet 0 06/02/2021  ? Vitamin D, Ergocalciferol, (DRISDOL) 1.25 MG (50000 UNIT) CAPS capsule TAKE 1 CAPSULE BY MOUTH  EVERY 7 DAYS 13 capsule 3 Past Week  ? Coenzyme Q10 (CO Q 10) 100 MG CAPS Take 100 mg by mouth daily.  (Patient not taking: Reported on 05/07/2021)     ? Guselkumab (TREMFYA) 100 MG/ML SOPN Inject 100 mg into the skin every 8 (eight) weeks. (Patient not taking: Reported on 05/15/2021) 1 mL 0 Not Taking  ? Magnesium Malate 1250 (141.7 Mg) MG TABS Take 1 tablet by mouth in the morning, at noon, and at bedtime. (Patient not taking: Reported on 05/07/2021)     ? methocarbamol (ROBAXIN) 500 MG tablet TAKE 1 TABLET BY MOUTH 3  TIMES DAILY AS NEEDED FOR  MUSCLE SPASMS 270 tablet 0 More than a month  ? ? ?Eudelia Bunch, Pharm.D ?06/03/2021 2:27 PM ? ?

## 2021-06-03 NOTE — Anesthesia Postprocedure Evaluation (Signed)
Anesthesia Post Note ? ?Patient: Jillian [name] ? ?Procedure(s) Performed: XI ROBOTIC GASTRIC SLEEVE RESECTION WITH A PRIMARY UMBILICAL HERNIA REPAIR (Abdomen) ?UPPER GI ENDOSCOPY (Esophagus) ? ?  ? ?Patient location during evaluation: PACU ?Anesthesia Type: General ?Level of consciousness: awake and alert ?Pain management: pain level controlled ?Vital Signs Assessment: post-procedure vital signs reviewed and stable ?Respiratory status: spontaneous breathing, nonlabored ventilation, respiratory function stable and patient connected to nasal cannula oxygen ?Cardiovascular status: blood pressure returned to baseline and stable ?Postop Assessment: no apparent nausea or vomiting ?Anesthetic complications: no ? ? ?No notable events documented. ? ?Last Vitals:  ?Vitals:  ? 06/03/21 1630 06/03/21 1651  ?BP: (!) 154/74 (!) 160/74  ?Pulse: 92 93  ?Resp: 19 18  ?Temp:  36.7 ?C  ?SpO2: 93% 96%  ?  ?Last Pain:  ?Vitals:  ? 06/03/21 1630  ?TempSrc:   ?PainSc: Asleep  ? ? ?  ?  ?  ?  ?  ?  ? ?[place]et [place] ? ? ? ? ?

## 2021-06-03 NOTE — Discharge Instructions (Signed)
GASTRIC BYPASS / [place] Instructions  These instructions are to help you care for yourself when you go home.  Call: If you have any problems. Call [phone] and ask for the surgeon on call If you have an emergency related to your surgery please use the ER at [facility].  Tell the ER staff that you are a new post-op gastric bypass or gastric sleeve patient   Signs and symptoms to report: Severe vomiting or nausea If you cannot handle clear liquids for longer than 1 day, call your surgeon  Abdominal pain which does not get better after taking your pain medication Fever greater than 100.4 F and chills Heart rate over 100 beats a minute Trouble breathing Chest pain  Redness, swelling, drainage, or foul odor at incision (surgical) sites  If your incisions open or pull apart Swelling or pain in calf (lower leg) Diarrhea (Loose bowel movements that happen often), frequent watery, uncontrolled bowel movements Constipation, (no bowel movements for 3 days) if this happens:  Take Milk of Magnesia, 2 tablespoons by mouth, 3 times a day for 2 days if needed Stop taking Milk of Magnesia once you have had a bowel movement Call your doctor if constipation continues Or Take Miralax  (instead of Milk of Magnesia) following the label instructions Stop taking Miralax once you have had a bowel movement Call your doctor if constipation continues Anything you think is abnormal for you   Normal side effects after surgery: Unable to sleep at night or unable to concentrate Irritability Being tearful (crying) or depressed These are common complaints, possibly related to your anesthesia, stress of surgery and change in lifestyle, that usually go away a few weeks after surgery.  If these feelings continue, call your medical doctor.  Wound Care: You may have surgical glue, steri-strips, or staples over your incisions after surgery Surgical glue:  Looks like a clear film over your incisions  and will wear off a little at a time Steri-strips : Adhesive strips of tape over your incisions. You may notice a yellowish color on the skin under the steri-strips. This is used to make the   steri-strips stick better. Do not pull the steri-strips off - let them fall off Staples: Staples may be removed before you leave the hospital If you go home with staples, call [facility] at for an appointment with your surgeons nurse to have staples removed 10 days after surgery, [phone] Showering: You may shower two (2) days after your surgery unless your surgeon tells you differently Wash gently around incisions with warm soapy water, rinse well, and gently pat dry  If you have a drain (tube from your incision), you may need someone to hold this while you shower  No tub baths until staples are removed and incisions are healed     Medications: Medications should be liquid or crushed if larger than the size of a dime Extended release pills (medication that releases a little bit at a time through the day) should not be crushed Depending on the size and number of medications you take, you may need to space (take a few throughout the day)/change the time you take your medications so that you do not over-fill your pouch (smaller stomach) Make sure you follow-up with your primary care physician to make medication changes needed during rapid weight loss and life-style changes If you have diabetes, follow up with the doctor that orders your diabetes medication(s) within one week after surgery and check  your blood sugar regularly. Do not drive while taking narcotics (pain medications) DO NOT take NSAID'S (Examples of NSAID's include ibuprofen, naproxen)  Diet:                    First 2 Weeks  You will see the nutritionist about two (2) weeks after your surgery. The nutritionist will increase the types of foods you can eat if you are handling liquids well: If you have severe vomiting or nausea  and cannot handle clear liquids lasting longer than 1 day, call your surgeon  Protein Shake Drink at least 2 ounces of shake 5-6 times per day Each serving of protein shakes (usually 8 - 12 ounces) should have a minimum of:  15 grams of protein  And no more than 5 grams of carbohydrate  Goal for protein each day: Men = 80 grams per day Women = 60 grams per day Protein powder may be added to fluids such as non-fat milk or Lactaid milk or Soy milk (limit to 35 grams added protein powder per serving)  Hydration Slowly increase the amount of water and other clear liquids as tolerated (See Acceptable Fluids) Slowly increase the amount of protein shake as tolerated   Sip fluids slowly and throughout the day May use sugar substitutes in small amounts (no more than 6 - 8 packets per day; i.e. Splenda)  Fluid Goal The first goal is to drink at least 8 ounces of protein shake/drink per day (or as directed by the nutritionist);  See handout from pre-op Bariatric Education Class for examples of protein shake/drink.   Slowly increase the amount of protein shake you drink as tolerated You may find it easier to slowly sip shakes throughout the day It is important to get your proteins in first Your fluid goal is to drink 64 - 100 ounces of fluid daily It may take a few weeks to build up to this 32 oz (or more) should be clear liquids  And  32 oz (or more) should be full liquids (see below for examples) Liquids should not contain sugar, caffeine, or carbonation  Clear Liquids: Water or Sugar-free flavored water (i.e. Fruit H2O, Propel) Decaffeinated coffee or tea (sugar-free) Solash Tullo Lite, Wylers Lite, Minute Maid Lite Sugar-free Jell-O Bouillon or broth Sugar-free Popsicle:   *Less than 20 calories each; Limit 1 per day  Full Liquids: Protein Shakes/Drinks + 2 choices per day of other full liquids Full liquids must be: No More Than 12 grams of Carbs per serving  No More Than 3 grams of Fat  per serving Strained low-fat cream soup Non-Fat milk Fat-free Lactaid Milk Sugar-free yogurt (Dannon Lite & Fit, Greek yogurt)      Vitamins and Minerals Start 1 day after surgery unless otherwise directed by your surgeon Bariatric Specific Complete Multivitamins Chewable Calcium Citrate with Vitamin D-3 (Example: 3 Chewable Calcium Plus 600 with Vitamin D-3) Take 500 mg three (3) times a day for a total of 1500 mg each day Do not take all 3 doses of calcium at one time as it may cause constipation, and you can only absorb 500 mg  at a time  Do not mix multivitamins containing iron with calcium supplements; take 2 hours apart  Menstruating women and those at risk for anemia (a blood disease that causes weakness) may need extra iron Talk with your doctor to see if you need more iron If you need extra iron: Total daily Iron recommendation (including Vitamins) is 50 to 100  mg Iron/day Do not stop taking or change any vitamins or minerals until you talk to your nutritionist or surgeon Your nutritionist and/or surgeon must approve all vitamin and mineral supplements   Activity and Exercise: It is important to continue walking at home.  Limit your physical activity as instructed by your doctor.  During this time, use these guidelines: Do not lift anything greater than ten (10) pounds for at least two (2) weeks Do not go back to work or drive until Engineer, production says you can You may have sex when you feel comfortable  It is VERY important for female patients to use a reliable birth control method; fertility often increases after surgery  Do not get pregnant for at least 18 months Start exercising as soon as your doctor tells you that you can Make sure your doctor approves any physical activity Start with a simple walking program Walk 5-15 minutes each day, 7 days per week.  Slowly increase until you are walking 30-45 minutes per day Consider joining our [place] program. [phone] or email  [email or web address]   Special Instructions Things to remember:  Use your CPAP when sleeping if this applies to you, do not stop the use of CPAP unless directed by physician after a sleep study [facility] has a free Bariatric Surgery Support Group that meets monthly, the 3rd Thursday, 6 pm.  Please review discharge information for date and location of this meeting. It is very important to keep all follow up appointments with your surgeon, nutritionist, primary care physician, and behavioral health practitioner After the first year, please follow up with your bariatric surgeon and nutritionist at least once a year in order to maintain best weight loss results   [facility]: [place]: [phone] Bariatric Nurse Coordinator: [phone]

## 2021-06-03 NOTE — Progress Notes (Signed)

## 2021-06-03 NOTE — Op Note (Signed)
? ?Patient: Jillian [name] (December 21, 1955, [id number]) ? ?Date of Surgery: 06/03/2021  ? ?Preoperative Diagnosis: Morbid obesity  ? ?Postoperative Diagnosis: Morbid obesity  ? ?Surgical Procedure: XI ROBOTIC GASTRIC SLEEVE RESECTION WITH A PRIMARY UMBILICAL HERNIA REPAIR: 64403 (CPT?) ?UPPER GI ENDOSCOPY: [id number]  ? ?Operative Team Members:  ?Surgeon(s) and Role: ?   * Fumi Guadron, Nickola Major, MD - Primary ?   Johnathan Hausen, MD - Assisting  ? ?Anesthesiologist: Barnet Glasgow, MD ?CRNA: Niel Hummer, CRNA; Sharlette Dense, CRNA  ? ?Anesthesia: General  ? ?Fluids:  ?Total I/O ?In: 1000 [I.V.:1000] ?Out: 50 [Blood:50] ? ?Complications: None ? ?Drains:  none  ? ?Specimen:  ?ID Type Source Tests Collected by Time Destination  ?1 : Stomach Tissue PATH GI benign resection SURGICAL PATHOLOGY Captola Teschner, Nickola Major, MD 06/03/2021 1332   ?  ? ?Disposition:  PACU - hemodynamically stable. ? ?Plan of Care: Admit to inpatient  ? ? ? ?Indications for Procedure:  ?[name] is a 66 year old female who presented for bariatric surgery consultation  Her BMI is 37.25 and she has a history of sleep apnea. I reviewed the metabolic surgeries we offer here including a robotic sleeve gastrectomy and robotic gastric bypass. We reviewed in detail the procedures themselves as well as the risks, benefits, and alternatives. I entered her information into the [facility] metabolic surgery risk/benefit calculator to aid in this discussion. After full discussion the patient is interested in proceeding with sleeve gastrectomy. We will begin the preoperative pathway to include the following ?- Bloodwork ?- Dietitian consult - Completed with Sandie Ano, RD ?- Psychology consult ?- Chest x-ray 09/09/20 - No active cardiopulmonary disease ?- EKG 09/09/20 - normal NSR ?- Upper endoscopy 02/05/22 ?- Normal esophagus. ?- Gastroesophageal flap valve classified as Hill Grade II (fold present, opens with respiration). ?- Normal stomach. Biopsied. ?-  Z-line irregular. ?- Normal examined duodenum. ?             - Biopsy negative for h. Pylori ?  ?Today she presents for robotic sleeve gastrectomy.  We again discussed the risks, benefits and alternatives.  After a full discussion and all questions answered the patient granted consent to proceed.  We will proceed as scheduled. ?  ? ?Findings: Umbilical hernia - repaired with three 2-0 PDS figure of 8 sutures. ? ?Infection status: ?Patient: Private Patient Elective Case ?Case: Elective ?Infection Present At Time Of Surgery (PATOS): None ? ? ?Description of Procedure:  ? ?On the date stated above, the patient was taken to the operating room suite and placed in supine positioning.  General endotracheal anesthesia was induced.  A timeout was completed verifying the correct patient, procedure, positioning and equipment needed for the case.  The patient's abdomen was prepped and draped in the usual sterile fashion.  I entered the patient's right upper quadrant using a 5 mm trocar in the optical technique.  There was no trauma to underlying viscera with initial trocar placement.  The abdomen was insufflated 15 mmHg.  A total of 4 robotic trochars were placed across the mid abdomen, including the 5 mm initial trocar being upsized to a 8 mm trocar.  The robotic stapler trocar was placed in the number two position through a umbilical hernia defect.  The [facility] liver retractor was placed through the subxiphoid region and under the left lobe of the liver and was connected to the rail of the bed.  A TAP block was placed using marcaine and Exparel under direct vision of the  laparoscope.  The Eddy was docked and we transitioned to robotic surgery. ?  ?Using the tip up fenestrated grasper, fenestrated bipolar, 30 degree camera and Synchroseal from the patient's right to left, we began by dissecting the angle of His off the left crus of the diaphragm.  The adhesions between the stomach, spleen and  diaphragm were divided using the Synchroseal to define the angle of His.  I then started 6 cm away from the pylorus along the greater curve the stomach and divided the gastroepiploic vessels and the gastrocolic ligament.  The lesser sac was entered.  There were really no  adhesions to the posterior wall of the stomach.  The greater curve was mobilized working superiorly toward the spleen.  All of the gastroepiploic and short gastric vessels were divided as we divided the gastrocolic and gastrosplenic ligaments.  As we reached the splenic hilum, I lifted the stomach anteriorly.  I created a tunnel between the stomach and its attachments to the retroperitoneum posteriorly just to the left of the GE junction until I encountered the left crus and my previous angle of His dissection.  We then were able to approach the shortest of the short gastrics both from the greater curve the stomach laterally and from the left crus medially.  These were divided using the Synchroseal and the fundus of the stomach was fully mobilized.  With the stomach fully mobilized we direct our attention to stapling.  A 40 French VISI G was inserted into the stomach and positioned along the lesser curve the stomach and suction was applied.  The 60 mm robotic sureform linear stapler was used to create the sleeve gastrectomy.  We started 6 cm from the pylorus and were careful to avoid narrowing at the incisura.  We stayed about 1 cm away from the GE junction to protect the sling fibers.  We used two blue and multiple white loads of the linear stapler.  With the sleeve gastrectomy completed the VISI G was taken off suction and removed and we performed an upper endoscopy.  The foregut was submerged in saline irrigation and the adult upper endoscope was inserted into the stomach as far as the pylorus to inspect the sleeve.  The sleeve appeared appropriately oriented without any twisting.  There was good hemostasis .  The sleeve was widely patent at the  incisura with no narrowing.  There was no significant retained fundus.  The sleeve was inflated with the endoscope and there was no bubbling of the irrigation, suggesting a negative leak test and a airtight sleeve gastrectomy.  The foregut was decompressed with the endoscope and the endoscope was removed.  A 2-0 vicryl suture was run along the distal two staple lines to pexy the sleeve back to the omentum and to ensure hemostasis. There was good hemostasis at the end of the case.  The robot was undocked and moved away from the field.  The sleeve gastrectomy specimen was removed from the stapler port.  The fascia at the umbilical hernia was dissected free from surrounding tissues.  The umbilical hernia sac was resected.  The fascia was defined and closed horizontally using three figure of eight 2-0 PDS sutures.  The liver retractor was removed under direct vision.  The pneumoperitoneum was evacuated.  The skin was closed using 4-0 Monocryl and Dermabond.  All sponge and needle counts were correct at the end of the case. ? ? ? ?Louanna Raw, MD ?General, Bariatric, & Minimally Invasive Surgery ?  [facility], [place] ? ?

## 2021-06-04 ENCOUNTER — Encounter (HOSPITAL_COMMUNITY): Payer: Self-pay | Admitting: Surgery

## 2021-06-04 ENCOUNTER — Other Ambulatory Visit (HOSPITAL_COMMUNITY): Payer: Self-pay

## 2021-06-04 LAB — CBC WITH DIFFERENTIAL/PLATELET
Abs Immature Granulocytes: 0.05 10*3/uL (ref 0.00–0.07)
Basophils Absolute: 0 10*3/uL (ref 0.0–0.1)
Basophils Relative: 0 %
Eosinophils Absolute: 0 10*3/uL (ref 0.0–0.5)
Eosinophils Relative: 0 %
HCT: 35.7 % — ABNORMAL LOW (ref 36.0–46.0)
Hemoglobin: 12.1 g/dL (ref 12.0–15.0)
Immature Granulocytes: 0 %
Lymphocytes Relative: 8 %
Lymphs Abs: 1 10*3/uL (ref 0.7–4.0)
MCH: 31.3 pg (ref 26.0–34.0)
MCHC: 33.9 g/dL (ref 30.0–36.0)
MCV: 92.5 fL (ref 80.0–100.0)
Monocytes Absolute: 0.7 10*3/uL (ref 0.1–1.0)
Monocytes Relative: 6 %
Neutro Abs: 10 10*3/uL — ABNORMAL HIGH (ref 1.7–7.7)
Neutrophils Relative %: 86 %
Platelets: 226 10*3/uL (ref 150–400)
RBC: 3.86 MIL/uL — ABNORMAL LOW (ref 3.87–5.11)
RDW: 12 % (ref 11.5–15.5)
WBC: 11.8 10*3/uL — ABNORMAL HIGH (ref 4.0–10.5)
nRBC: 0 % (ref 0.0–0.2)

## 2021-06-04 NOTE — Progress Notes (Signed)
Patient alert and oriented, pain is controlled. Patient is tolerating fluids, advanced to protein shake today, patient is tolerating well.  Reviewed Gastric sleeve discharge instructions with patient and patient is able to articulate understanding.  Provided information on BELT program, Support Group and [facility] outpatient pharmacy. All questions answered, will continue to monitor.  

## 2021-06-04 NOTE — Progress Notes (Signed)
Patient was given discharge instructions, and all questions were answered. Patient was stable for discharge and was walked to the main exit. 

## 2021-06-04 NOTE — Progress Notes (Deleted)
? ?Office Visit Note ? ?Patient: Jillian [name]             ?Date of Birth: [birth year]           ?MRN: [id number]             ?PCP: Jinny Sanders, MD ?Referring: Jinny Sanders, MD ?Visit Date: 06/18/2021 ?Occupation: '@GUAROCC'$ @ ? ?Subjective:  ?No chief complaint on file. ? ? ?History of Present Illness: Jillian [name] is a 66 y.o. female ***  ? ?Activities of Daily Living:  ?Patient reports morning stiffness for *** {minute/hour:19697}.   ?Patient {ACTIONS;DENIES/REPORTS:21021675::"Denies"} nocturnal pain.  ?Difficulty dressing/grooming: {ACTIONS;DENIES/REPORTS:21021675::"Denies"} ?Difficulty climbing stairs: {ACTIONS;DENIES/REPORTS:21021675::"Denies"} ?Difficulty getting out of chair: {ACTIONS;DENIES/REPORTS:21021675::"Denies"} ?Difficulty using hands for taps, buttons, cutlery, and/or writing: {ACTIONS;DENIES/REPORTS:21021675::"Denies"} ? ?No Rheumatology ROS completed.  ? ?PMFS History:  ?Patient Active Problem List  ? Diagnosis Date Noted  ? Morbid obesity ([place]) 06/03/2021  ? Psoriatic arthritis ([place]) 05/07/2021  ? Psoriasis 05/07/2021  ? Primary osteoarthritis of both hands 05/07/2021  ? Primary osteoarthritis of both feet 05/07/2021  ? Encounter for weight management 02/14/2021  ? Pitting of nails 01/23/2020  ? OSA (obstructive sleep apnea) 04/12/2019  ? S/P left unicompartmental knee replacement 03/21/2019  ? Osteoarthritis of left knee 03/09/2019  ? Primary localized osteoarthritis of right knee 02/07/2019  ? Status post right partial knee replacement 02/07/2019  ? Class 2 severe obesity due to excess calories with serious comorbidity and body mass index (BMI) of 37.0 to 37.9 in adult [facility]) 05/13/2018  ? Prediabetes 12/03/2017  ? Non-restorative sleep 02/27/2016  ? Vitamin D deficiency 10/28/2015  ? Generalized anxiety disorder 11/25/2012  ? HYPERTRIGLYCERIDEMIA 12/20/2008  ? MENOPAUSE, EARLY 10/04/2008  ? Fibromyalgia 10/04/2008  ?  ?Past Medical History:  ?Diagnosis Date  ? Anxiety   ? Cancer [facility])   ?  basal cell 2016  ? Dermatitis   ? per patient   ? Fibromyalgia   ? Primary localized osteoarthritis of right knee 02/07/2019  ? Sleep apnea   ? uses CPAP  ?  ?Family History  ?Problem Relation Age of Onset  ? Cancer Father   ?     colon  ? Hyperlipidemia Father   ? Hypertension Father   ? Diabetes Sister   ? Cancer Paternal Aunt   ?     colon  ? Cancer Maternal Grandmother   ?     colon  ? Hypertension Maternal Grandmother   ? Anuerysm Paternal Grandfather   ? Psoriasis Brother   ? Arthritis Brother   ? Psoriasis Sister   ? Osteoarthritis Brother   ? Diabetes Brother   ? Throat cancer Brother   ? Healthy Son   ? Healthy Daughter   ? ?Past Surgical History:  ?Procedure Laterality Date  ? BIOPSY  02/05/2021  ? Procedure: BIOPSY;  Surgeon: Felicie Morn, MD;  Location: [facility];  Service: General;;  ? CESAREAN SECTION    ? times 2  ? ESOPHAGOGASTRODUODENOSCOPY N/A 02/05/2021  ? Procedure: ESOPHAGOGASTRODUODENOSCOPY (EGD);  Surgeon: Felicie Morn, MD;  Location: [facility];  Service: General;  Laterality: N/A;  ? EYE SURGERY Bilateral 04/24/2020  ? bilateral eyelids   ? HERNIA REPAIR    ? PARTIAL KNEE ARTHROPLASTY Right 02/07/2019  ? Procedure: UNICOMPARTMENTAL KNEE;  Surgeon: Marchia Bond, MD;  Location: [facility];  Service: Orthopedics;  Laterality: Right;  ? PARTIAL KNEE ARTHROPLASTY Left 03/21/2019  ? Procedure: UNICOMPARTMENTAL KNEE;  Surgeon: Marchia Bond, MD;  Location: [facility];  Service: Orthopedics;  Laterality: Left;  ? UPPER GI ENDOSCOPY  03/2021  ? UPPER GI ENDOSCOPY N/A 06/03/2021  ? Procedure: UPPER GI ENDOSCOPY;  Surgeon: Felicie Morn, MD;  Location: [facility];  Service: General;  Laterality: N/A;  ? ?Social History  ? ?Social History Narrative  ? Regular exercise-yes, but seasonal walks and swims  ? Diet: healthy, low carbohydrates, no caffeine  ? ?Immunization History  ?Administered Date(s) Administered  ? Influenza-Unspecified 01/01/2020, 12/28/2020  ? Moderna Sars-Covid-2  Vaccination 05/23/2019, 07/04/2019  ? PNEUMOCOCCAL CONJUGATE-20 08/16/2020  ? Td 03/30/2004  ? Tdap 10/28/2015  ? Zoster, Live 08/30/2014  ?  ? ?Objective: ?Vital Signs: There were no vitals taken for this visit.  ? ?Physical Exam  ? ?Musculoskeletal Exam: *** ? ?CDAI Exam: ?CDAI Score: -- ?Patient Global: --; Provider Global: -- ?Swollen: --; Tender: -- ?Joint Exam 06/18/2021  ? ?No joint exam has been documented for this visit  ? ?There is currently no information documented on the homunculus. Go to the Rheumatology activity and complete the homunculus joint exam. ? ?Investigation: ?No additional findings. ? ?Imaging: ?No results found. ? ?Recent Labs: ?Lab Results  ?Component Value Date  ? WBC 11.8 (H) 06/04/2021  ? HGB 12.1 06/04/2021  ? PLT 226 06/04/2021  ? NA 135 05/21/2021  ? K 4.5 05/21/2021  ? CL 103 05/21/2021  ? CO2 27 05/21/2021  ? GLUCOSE 118 (H) 05/21/2021  ? BUN 20 05/21/2021  ? CREATININE 0.90 06/03/2021  ? BILITOT 0.3 05/21/2021  ? ALKPHOS 101 05/21/2021  ? AST 19 05/21/2021  ? ALT 27 05/21/2021  ? PROT 7.1 05/21/2021  ? ALBUMIN 4.3 05/21/2021  ? CALCIUM 9.5 05/21/2021  ? GFRAA 83 07/16/2020  ? QFTBGOLDPLUS Negative 09/09/2020  ? ? ?Speciality Comments: tremfya -started September 16, 2020 ? ?Procedures:  ?No procedures performed ?Allergies: Patient has no known allergies.  ? ?Assessment / Plan:     ?Visit Diagnoses: No diagnosis found. ? ?Orders: ?No orders of the defined types were placed in this encounter. ? ?No orders of the defined types were placed in this encounter. ? ? ?Face-to-face time spent with patient was *** minutes. Greater than 50% of time was spent in counseling and coordination of care. ? ?Follow-Up Instructions: No follow-ups on file. ? ? ?Earnestine Mealing, CMA ? ?Note - This record has been created using [employer].  ?Chart creation errors have been sought, but may not always  ?have been located. Such creation errors do not reflect on  ?the standard of medical care.  ?

## 2021-06-04 NOTE — Discharge Summary (Signed)
?Patient ID: ?Jillian [name] ?[id number] ?66 y.o. ?10/10/1955 ? ?06/03/2021 ? ?Discharge date and time: 06/04/2021 ? ?Admitting Physician: Nickola Major Talor Cheema ? ?Discharge Physician: Nickola Major Manami Tutor ? ?Admission Diagnoses: Morbid obesity ([place]) [E66.01] ?Patient Active Problem List  ? Diagnosis Date Noted  ? Morbid obesity ([place]) 06/03/2021  ? Psoriatic arthritis ([place]) 05/07/2021  ? Psoriasis 05/07/2021  ? Primary osteoarthritis of both hands 05/07/2021  ? Primary osteoarthritis of both feet 05/07/2021  ? Encounter for weight management 02/14/2021  ? Pitting of nails 01/23/2020  ? OSA (obstructive sleep apnea) 04/12/2019  ? S/P left unicompartmental knee replacement 03/21/2019  ? Osteoarthritis of left knee 03/09/2019  ? Primary localized osteoarthritis of right knee 02/07/2019  ? Status post right partial knee replacement 02/07/2019  ? Class 2 severe obesity due to excess calories with serious comorbidity and body mass index (BMI) of 37.0 to 37.9 in adult [facility]) 05/13/2018  ? Prediabetes 12/03/2017  ? Non-restorative sleep 02/27/2016  ? Vitamin D deficiency 10/28/2015  ? Generalized anxiety disorder 11/25/2012  ? HYPERTRIGLYCERIDEMIA 12/20/2008  ? MENOPAUSE, EARLY 10/04/2008  ? Fibromyalgia 10/04/2008  ? ? ? ?Discharge Diagnoses: Morbid obesity ?Patient Active Problem List  ? Diagnosis Date Noted  ? Morbid obesity ([place]) 06/03/2021  ? Psoriatic arthritis ([place]) 05/07/2021  ? Psoriasis 05/07/2021  ? Primary osteoarthritis of both hands 05/07/2021  ? Primary osteoarthritis of both feet 05/07/2021  ? Encounter for weight management 02/14/2021  ? Pitting of nails 01/23/2020  ? OSA (obstructive sleep apnea) 04/12/2019  ? S/P left unicompartmental knee replacement 03/21/2019  ? Osteoarthritis of left knee 03/09/2019  ? Primary localized osteoarthritis of right knee 02/07/2019  ? Status post right partial knee replacement 02/07/2019  ? Class 2 severe obesity due to excess calories with serious comorbidity and body mass index  (BMI) of 37.0 to 37.9 in adult [facility]) 05/13/2018  ? Prediabetes 12/03/2017  ? Non-restorative sleep 02/27/2016  ? Vitamin D deficiency 10/28/2015  ? Generalized anxiety disorder 11/25/2012  ? HYPERTRIGLYCERIDEMIA 12/20/2008  ? MENOPAUSE, EARLY 10/04/2008  ? Fibromyalgia 10/04/2008  ? ? ?Operations: Procedure(s): ?XI ROBOTIC GASTRIC SLEEVE RESECTION WITH A PRIMARY UMBILICAL HERNIA REPAIR ?UPPER GI ENDOSCOPY ? ?Admission Condition: good ? ?Discharged Condition: good ? ?Indication for Admission: Morbid obesity ? ?Hospital Course: [name] presented for elective robotic sleeve gastrectomy with umbilical hernia repair on 06/03/21.  She recovered well from surgery and was discharged. ? ?Consults: None ? ?Significant Diagnostic Studies: None ? ?Treatments: surgery: as above ? ?Disposition: Home ? ?Patient Instructions:  ?Allergies as of 06/04/2021   ?No Known Allergies ?  ? ?  ?Medication List  ?  ? ?STOP taking these medications   ? ?traMADol 50 MG tablet ?Commonly known as: ULTRAM ?  ? ?  ? ?TAKE these medications   ? ?acetaminophen 500 MG tablet ?Commonly known as: TYLENOL ?Take 2 tablets (1,000 mg total) by mouth every 8 (eight) hours for 5 days. ?  ?augmented betamethasone dipropionate 0.05 % ointment ?Commonly known as: DIPROLENE-AF ?Apply topically 2 (two) times daily as needed. ?  ?buPROPion 150 MG 24 hr tablet ?Commonly known as: WELLBUTRIN XL ?TAKE 1 TABLET BY MOUTH  DAILY ?  ?Co Q 10 100 MG Caps ?Take 100 mg by mouth daily. ?  ?DULoxetine 30 MG capsule ?Commonly known as: CYMBALTA ?TAKE 3 CAPSULES BY MOUTH  DAILY ?  ?fluocinonide 0.05 % external solution ?Commonly known as: LIDEX ?Apply 1 application topically 2 (two) times daily as needed (PSORIASIS). ?  ?Magnesium Malate 1250 (  141.7 Mg) MG Tabs ?Take 1 tablet by mouth in the morning, at noon, and at bedtime. ?  ?methocarbamol 500 MG tablet ?Commonly known as: ROBAXIN ?TAKE 1 TABLET BY MOUTH 3  TIMES DAILY AS NEEDED FOR  MUSCLE SPASMS ?  ?ondansetron 4 MG  disintegrating tablet ?Commonly known as: ZOFRAN-ODT ?Dissolve 1 tablet by mouth every 6 hours as needed for nausea or vomiting. ?  ?oxyCODONE 5 MG immediate release tablet ?Commonly known as: Oxy IR/ROXICODONE ?Take 1 tablet by mouth every 6 hours as needed for severe pain. ?  ?pantoprazole 40 MG tablet ?Commonly known as: PROTONIX ?Take 1 tablet by mouth daily. ?  ?pregabalin 150 MG capsule ?Commonly known as: LYRICA ?TAKE 1 CAPSULE BY MOUTH  TWICE DAILY ?  ?Tremfya 100 MG/ML Sopn ?Generic drug: Guselkumab ?Inject 100 mg into the skin every 8 (eight) weeks. ?  ?Vitamin D (Ergocalciferol) 1.25 MG (50000 UNIT) Caps capsule ?Commonly known as: DRISDOL ?TAKE 1 CAPSULE BY MOUTH  EVERY 7 DAYS ?  ? ?  ? ? ?Activity: no heavy lifting for 4 weeks ?Diet: Bariatric diet protocol ?Wound Care: keep wound clean and dry ? ?Follow-up:  With Dr. Thermon Leyland per bariatric follow up schedule. ? ?Signed: ?Nickola Major Braeleigh Pyper ?General, Bariatric, & Minimally Invasive Surgery ?[facility], [place] ? ? ?06/04/2021, 8:42 AM ? ?

## 2021-06-06 ENCOUNTER — Telehealth (HOSPITAL_COMMUNITY): Payer: Self-pay | Admitting: *Deleted

## 2021-06-06 LAB — SURGICAL PATHOLOGY

## 2021-06-06 NOTE — Telephone Encounter (Signed)
1.  Tell me about your pain and pain management? ?Pt denies any abdominal pain, just "soreness". Pt did call office to c/o very sore throat.  Pt was instructed to get medication OTC.  Pt states that her throat is feeling "better today". ? ?2.  Let's talk about fluid intake.  How much total fluid are you taking in? ?Pt states that she is getting in at least 64oz of fluid including protein shakes, bottled water, decaf coffee and tea. Pt instructed to assess status and suggestions daily utilizing Hydration Action Plan on discharge folder and to call [facility] if in the "red zone".  ? ?3.  How much protein have you taken in the last 2 days? ?Pt states she is meeting her goal of 60g of protein each day with the protein shakes. ? ?4.  Have you had nausea?  Tell me about when have experienced nausea and what you did to help? ?Pt denies nausea. ?  ?5.  Has the frequency or color changed with your urine? ?Pt states that she is urinating "fine" with no changes in frequency or urgency.   ?  ?6.  Tell me what your incisions look like? ?"Incisions look fine". Pt denies a fever, chills.  Pt states incisions are not swollen, open, or draining.  Pt encouraged to call [facility] if incisions change. ?  ?7.  Have you been passing gas? BM? ?Pt states that she has not had a BM, but is "passing gas".  Pt instructed to take either Miralax or MoM as instructed per "Gastric Bypass/Sleeve Discharge Home Care Instructions".  Pt to call surgeon's office if not able to have BM with medication. ?  ?8.  If a problem or question were to arise who would you call?  Do you know contact numbers for [facility], [facility], and [facility]? ?Pt denies dehydration symptoms.  Pt can describe s/sx of dehydration.  Pt knows to call [facility] for surgical, [facility] for nutrition, and [facility] for non-urgent questions or concerns. ?  ?9.  How has the walking going? ?Pt states she is walking around and able to be active without difficulty. ?  ?10. Are you still using your incentive spirometer?  If so, how  often? ?Pt states that she is using the I.S. "4-5x a day".  Pt encouraged to use incentive spirometer, at least 10x every hour while awake until she sees the surgeon. ? ?11.  How are your vitamins and calcium going?  How are you taking them? ?Pt states that she is taking her supplements and vitamins without difficulty. ? ?Reminded patient that the first 30 days post-operatively are important for successful recovery.  Practice good hand hygiene, wearing a mask when appropriate (since optional in most places), and minimizing exposure to people who live outside of the home, especially if they are exhibiting any respiratory, GI, or illness-like symptoms.   ? ?

## 2021-06-16 NOTE — Telephone Encounter (Signed)
Called [employer] for update on patient's [employer] PAP application. Per rep, HCP form is missing. Provider form refaxed and received another confirmation fax of receipt ? ?Phone: [phone] ? ?Knox Saliva, PharmD, MPH, BCPS ?Clinical Pharmacist (Rheumatology and Pulmonology) ?

## 2021-06-17 ENCOUNTER — Encounter: Payer: Medicare Other | Attending: Surgery | Admitting: Skilled Nursing Facility1

## 2021-06-17 ENCOUNTER — Other Ambulatory Visit: Payer: Self-pay

## 2021-06-17 DIAGNOSIS — Z6835 Body mass index (BMI) 35.0-35.9, adult: Secondary | ICD-10-CM | POA: Insufficient documentation

## 2021-06-17 DIAGNOSIS — Z713 Dietary counseling and surveillance: Secondary | ICD-10-CM | POA: Insufficient documentation

## 2021-06-17 NOTE — Progress Notes (Signed)
2 Week Post-Operative Nutrition Class ?  ?Patient was seen on 06/17/2021 for Post-Operative Nutrition education at the [facility].  ?  ?Anthropometrics  ?Start weight at [facility]: 242 lbs (date: 10/16/2020) ?Today's weight: 246 lbs ?Weight change: maintained lbs ?BMI: 37.40 kg/m2   ? ?Clinical  ?Medical hx: Sleep apnea, psoriasis, fibromyalgia  ?Medications: see list  ?Labs:  ?Notable signs/symptoms: chronic pain ?Any previous deficiencies? Yes: vitamin D ?  ?Body Composition Scale 06/17/2021  ?Current Body Weight 230.5  ?Total Body Fat % 43.4  ?Visceral Fat 15  ?Fat-Free Mass % 56.5  ? Total Body Water % 42.7  ?Muscle-Mass lbs 31.4  ?BMI 35.0  ?Body Fat Displacement   ?       Torso  lbs 61.9  ?       Left Leg  lbs 12.3  ?       Right Leg  lbs 12.3  ?       Left Arm  lbs 6.1  ?       Right Arm   lbs 6.1  ? ? ?  ?The following the learning objectives were met by the patient during this course: ?Identifies Phase 3 (Soft, High Proteins) Dietary Goals and will begin from 2 weeks post-operatively to 2 months post-operatively ?Identifies appropriate sources of fluids and proteins  ?Identifies appropriate fat sources and healthy verses unhealthy fat types   ?States protein recommendations and appropriate sources post-operatively ?Identifies the need for appropriate texture modifications, mastication, and bite sizes when consuming solids ?Identifies appropriate fat consumption and sources ?Identifies appropriate multivitamin and calcium sources post-operatively ?Describes the need for physical activity post-operatively and will follow MD recommendations ?States when to call healthcare provider regarding medication questions or post-operative complications ?  ?Handouts given during class include: ?Phase 3A: Soft, High Protein Diet Handout ?Phase 3 High Protein Meals ?Healthy Fats ?  ?Follow-Up Plan: ?Patient will follow-up at [facility] in 6 weeks for 2 month post-op nutrition visit for diet advancement  per MD.  ?

## 2021-06-17 NOTE — Telephone Encounter (Addendum)
Received a fax from  [facility] regarding an approval for [place] patient assistance from 06/16/21 to 03/29/22.  ? ?Phone number: [phone] ? ?Patient had gastric sleeve surgery on 06/03/21. She has f/u appt with Dr. Thermon Leyland on 06/26/21. She has been advised to ask if patient is cleared to start [place] and if he can write clearance letter for her to start. I advised her to ensure that she picks up call from [facility] and schedules shipment so that she can start therapy once cleared. ? ?Knox Saliva, PharmD, MPH, BCPS ?Clinical Pharmacist (Rheumatology and Pulmonology) ? ?

## 2021-06-18 ENCOUNTER — Ambulatory Visit: Payer: Medicare Other | Admitting: Physician Assistant

## 2021-06-18 DIAGNOSIS — G478 Other sleep disorders: Secondary | ICD-10-CM

## 2021-06-18 DIAGNOSIS — L4050 Arthropathic psoriasis, unspecified: Secondary | ICD-10-CM

## 2021-06-18 DIAGNOSIS — F411 Generalized anxiety disorder: Secondary | ICD-10-CM

## 2021-06-18 DIAGNOSIS — R7303 Prediabetes: Secondary | ICD-10-CM

## 2021-06-18 DIAGNOSIS — M19072 Primary osteoarthritis, left ankle and foot: Secondary | ICD-10-CM

## 2021-06-18 DIAGNOSIS — Z96652 Presence of left artificial knee joint: Secondary | ICD-10-CM

## 2021-06-18 DIAGNOSIS — G4733 Obstructive sleep apnea (adult) (pediatric): Secondary | ICD-10-CM

## 2021-06-18 DIAGNOSIS — L409 Psoriasis, unspecified: Secondary | ICD-10-CM

## 2021-06-18 DIAGNOSIS — E559 Vitamin D deficiency, unspecified: Secondary | ICD-10-CM

## 2021-06-18 DIAGNOSIS — M19071 Primary osteoarthritis, right ankle and foot: Secondary | ICD-10-CM

## 2021-06-18 DIAGNOSIS — M797 Fibromyalgia: Secondary | ICD-10-CM

## 2021-06-18 DIAGNOSIS — L608 Other nail disorders: Secondary | ICD-10-CM

## 2021-06-18 DIAGNOSIS — G8929 Other chronic pain: Secondary | ICD-10-CM

## 2021-06-18 DIAGNOSIS — Z79899 Other long term (current) drug therapy: Secondary | ICD-10-CM

## 2021-06-18 DIAGNOSIS — M19041 Primary osteoarthritis, right hand: Secondary | ICD-10-CM

## 2021-06-18 DIAGNOSIS — Z96651 Presence of right artificial knee joint: Secondary | ICD-10-CM

## 2021-06-18 DIAGNOSIS — Z840 Family history of diseases of the skin and subcutaneous tissue: Secondary | ICD-10-CM

## 2021-06-23 ENCOUNTER — Telehealth: Payer: Self-pay | Admitting: Skilled Nursing Facility1

## 2021-06-23 NOTE — Telephone Encounter (Signed)
RD called pt to verify fluid intake once starting soft, solid proteins 2 week post-bariatric surgery.   Daily Fluid intake:  Daily Protein intake: Bowel Habits:   Concerns/issues:    LVM 

## 2021-06-26 ENCOUNTER — Telehealth: Payer: Self-pay | Admitting: Rheumatology

## 2021-06-26 ENCOUNTER — Other Ambulatory Visit: Payer: Self-pay | Admitting: Family Medicine

## 2021-06-26 NOTE — Telephone Encounter (Signed)
Patient called the office requesting to speak with [facility]. Patient states the surgeon approved Donnetta Hail and she has received the medication. ?

## 2021-06-27 NOTE — Telephone Encounter (Signed)
Patient scheduled for Taltz new start on 07/02/21. Patient confirmed she has received medication at home and placed in refrigerator ? ?Knox Saliva, PharmD, MPH, BCPS ?Clinical Pharmacist (Rheumatology and Pulmonology) ? ?

## 2021-06-27 NOTE — Telephone Encounter (Signed)
Last office visit 04/18/2021 for Weight Management.  Last refilled 04/01/2021 for #180 with no refills.  CPE scheduled for 08/19/2021. ?

## 2021-06-30 ENCOUNTER — Other Ambulatory Visit: Payer: Self-pay | Admitting: Family Medicine

## 2021-06-30 NOTE — Telephone Encounter (Signed)
Left message for Mrs. Lampkins to return my call in regards to her Tramadol refill. ?

## 2021-06-30 NOTE — Telephone Encounter (Signed)
Last office visit 04/18/2021 for weight management.  Tramadol not on current medication list.  Refill note:  The original prescription was discontinued on 06/04/2021 by Stechschulte, Nickola Major, MD for the following reason: Stop Taking at Discharge. Renewing this prescription may not be appropriate. ?

## 2021-07-01 NOTE — Patient Instructions (Signed)
Your next TALTZ dose is due on 07/16/21, 07/30/21, 08/13/21, 08/27/21, 09/10/21, 09/24/21, then every 4 weeks thereafter (starting on 10/22/21) ? ?HOLD TALTZ if you have signs or symptoms of an infection. You can resume once you feel better or back to your baseline. ?HOLD TALTZ if you start antibiotics to treat an infection. ?HOLD TALTZ around the time of surgery/procedures. Your surgeon will be able to provide recommendations on when to hold BEFORE and when you are cleared to [place]. ? ?Pharmacy information: ?Your prescription will be shipped from [facility]. ?Their phone number is [phone] ?Please call to schedule shipment and confirm address. They will mail your medication to your home. ? ?Labs are due in 1 month then every 3 months. ?Lab hours are from Monday to Thursday 1:30-4:30pm and Friday 1:30-4pm. You do not need an appointment if you come for labs during these times. ? ?How to manage an injection site reaction: ?Remember the 5 C's: ?COUNTER - leave on the counter at least 30 minutes but up to overnight to bring medication to room temperature. This may help prevent stinging ?COLD - place something cold (like an ice gel pack or cold water bottle) on the injection site just before cleansing with alcohol. This may help reduce pain ?CLARITIN - use Claritin (generic name is loratadine) for the first two weeks of treatment or the day of, the day before, and the day after injecting. This will help to minimize injection site reactions ?CORTISONE CREAM - apply if injection site is irritated and itching ?CALL ME - if injection site reaction is bigger than the size of your fist, looks infected, blisters, or if you develop hives  ?

## 2021-07-01 NOTE — Telephone Encounter (Signed)
Left message for Mrs. Galeno to return my call in regards to her Tramadol refill. ?

## 2021-07-01 NOTE — Progress Notes (Signed)
Pharmacy Note ? ?Subjective:   ?Patient presents to clinic today to receive first dose of Taltz for psoriatic arthritis and psoriasis. Patient had gastric sleeve surgery on 06/03/21.  Her last Tremfya dose was in January 2023 ? ?Patient running a fever or have signs/symptoms of infection? No ? ?Patient currently on antibiotics for the treatment of infection? No ? ?Patient have any upcoming invasive procedures/surgeries? No ? ?Objective: ?CMP  ?   ?Component Value Date/Time  ? NA 135 05/21/2021 0905  ? NA 141 10/15/2020 1320  ? K 4.5 05/21/2021 0905  ? CL 103 05/21/2021 0905  ? CO2 27 05/21/2021 0905  ? GLUCOSE 118 (H) 05/21/2021 0905  ? BUN 20 05/21/2021 0905  ? BUN 13 10/15/2020 1320  ? CREATININE 0.90 06/03/2021 1457  ? CREATININE 0.77 05/07/2021 1430  ? CALCIUM 9.5 05/21/2021 0905  ? PROT 7.1 05/21/2021 0905  ? PROT 6.2 10/15/2020 1320  ? ALBUMIN 4.3 05/21/2021 0905  ? ALBUMIN 4.2 10/15/2020 1320  ? AST 19 05/21/2021 0905  ? ALT 27 05/21/2021 0905  ? ALKPHOS 101 05/21/2021 0905  ? BILITOT 0.3 05/21/2021 0905  ? BILITOT 0.3 10/15/2020 1320  ? GFRNONAA >60 06/03/2021 1457  ? GFRNONAA 72 07/16/2020 0855  ? GFRAA 83 07/16/2020 0855  ? ? ?CBC ?   ?Component Value Date/Time  ? WBC 11.8 (H) 06/04/2021 0415  ? RBC 3.86 (L) 06/04/2021 0415  ? HGB 12.1 06/04/2021 0415  ? HGB 13.3 10/15/2020 1320  ? HCT 35.7 (L) 06/04/2021 0415  ? HCT 39.2 10/15/2020 1320  ? PLT 226 06/04/2021 0415  ? PLT 215 10/15/2020 1320  ? MCV 92.5 06/04/2021 0415  ? MCV 92 10/15/2020 1320  ? MCH 31.3 06/04/2021 0415  ? MCHC 33.9 06/04/2021 0415  ? RDW 12.0 06/04/2021 0415  ? RDW 12.6 10/15/2020 1320  ? LYMPHSABS 1.0 06/04/2021 0415  ? LYMPHSABS 1.6 10/15/2020 1320  ? MONOABS 0.7 06/04/2021 0415  ? EOSABS 0.0 06/04/2021 0415  ? EOSABS 0.1 10/15/2020 1320  ? BASOSABS 0.0 06/04/2021 0415  ? BASOSABS 0.0 10/15/2020 1320  ? ? ?Baseline Immunosuppressant Therapy Labs ?TB GOLD ? ?  Latest Ref Rng & Units 09/09/2020  ?  9:07 AM  ?Quantiferon TB Gold   ?Quantiferon TB Gold Plus Negative Negative    ? ?Hepatitis Panel ? ?  Latest Ref Rng & Units 09/09/2020  ?  9:07 AM  ?Hepatitis  ?Hep B Surface Ag Negative Negative    ?Hep B IgM Negative Negative    ? ?HIV ?Lab Results  ?Component Value Date  ? HIV Non Reactive 12/13/2018  ? ?Immunoglobulins ? ?  Latest Ref Rng & Units 09/09/2020  ?  9:07 AM  ?Immunoglobulin Electrophoresis  ?IgG 586 - 1,602 mg/dL 809    ?IgM 26 - 217 mg/dL 31    ? ?SPEP ? ?  Latest Ref Rng & Units 05/21/2021  ?  9:05 AM  ?Serum Protein Electrophoresis  ?Total Protein 6.5 - 8.1 g/dL 7.1    ? ?Chest x-ray: 09/09/20 - No active cardiopulmonary disease. ? ?Assessment/Plan:  ?Demonstrated proper injection technique with Taltz demo device  Patient able to demonstrate proper injection technique using the teach back method.  Patient self injected in the right and left thigh with: ? ?Sample Medication: Taltz '80mg'$ /ml autoinjector pen x 2 = '160mg'$  ?Lot: [id number] ?Expiration: 11/27/2022 ? ?Patient tolerated well.  Observed for 30 mins in office for adverse reaction and none noted.  ? ?Patient is to return in 1 month  for labs and 6-8 weeks for follow-up appointment.  Standing orders placed.  ? ?Donnetta Hail approved through patient assistance .   Rx sent to: [facility] for Taltz: [phone].  She has already received her first shipment at home. ? ?She will continue Taltz for psoriatic arthritis and plaque psoriasis overlap load of 160 mg (administered today in clinic, then 80 mg on weeks 2, 4, 6, 8, 10, 12 then 80 mg every 28 days   ? ?All questions encouraged and answered.  Instructed patient to call with any further questions or concerns. ? ?Knox Saliva, PharmD, MPH, BCPS ?Clinical Pharmacist (Rheumatology and Pulmonology) ? ?07/01/2021 11:08 AM ?

## 2021-07-02 ENCOUNTER — Encounter: Payer: Self-pay | Admitting: Pharmacist

## 2021-07-02 ENCOUNTER — Ambulatory Visit (INDEPENDENT_AMBULATORY_CARE_PROVIDER_SITE_OTHER): Payer: Medicare Other | Admitting: Pharmacist

## 2021-07-02 VITALS — BP 110/72 | HR 82

## 2021-07-02 DIAGNOSIS — Z79899 Other long term (current) drug therapy: Secondary | ICD-10-CM

## 2021-07-02 DIAGNOSIS — L408 Other psoriasis: Secondary | ICD-10-CM

## 2021-07-02 DIAGNOSIS — L409 Psoriasis, unspecified: Secondary | ICD-10-CM

## 2021-07-02 DIAGNOSIS — Z7189 Other specified counseling: Secondary | ICD-10-CM

## 2021-07-02 DIAGNOSIS — L4050 Arthropathic psoriasis, unspecified: Secondary | ICD-10-CM

## 2021-07-02 MED ORDER — TALTZ 80 MG/ML ~~LOC~~ SOAJ: SUBCUTANEOUS | 0 refills | Status: DC

## 2021-07-02 NOTE — Telephone Encounter (Signed)
Patient has not returned call in regards to Tramadol refill.  Refill denied.  FYI to Dr. Diona Browner.  ?

## 2021-07-03 MED ORDER — TRAMADOL HCL 50 MG PO TABS: 100.0000 mg | ORAL_TABLET | Freq: Three times a day (TID) | ORAL | 0 refills | Status: DC

## 2021-07-03 NOTE — Addendum Note (Signed)
Addended byEliezer Lofts E on: 07/03/2021 11:18 AM ? ? Modules accepted: Orders ? ?

## 2021-07-03 NOTE — Telephone Encounter (Addendum)
Patient called back was informed to stop only when she was on the oxycodone for after surgery. She only took the oxycodone for a few days post op. She does need refill of the tramadol two tab three times a day. She will be out of medications next week.  ?

## 2021-07-04 DIAGNOSIS — G4733 Obstructive sleep apnea (adult) (pediatric): Secondary | ICD-10-CM | POA: Diagnosis not present

## 2021-07-08 ENCOUNTER — Other Ambulatory Visit: Payer: Self-pay | Admitting: Family Medicine

## 2021-07-08 NOTE — Telephone Encounter (Signed)
Last refill was set as Phone In.  Please resend electronically.  ?

## 2021-07-16 ENCOUNTER — Other Ambulatory Visit: Payer: Self-pay | Admitting: Rheumatology

## 2021-07-16 DIAGNOSIS — L4050 Arthropathic psoriasis, unspecified: Secondary | ICD-10-CM

## 2021-07-16 DIAGNOSIS — Z79899 Other long term (current) drug therapy: Secondary | ICD-10-CM

## 2021-07-16 DIAGNOSIS — L409 Psoriasis, unspecified: Secondary | ICD-10-CM

## 2021-07-28 ENCOUNTER — Other Ambulatory Visit: Payer: Self-pay | Admitting: Family Medicine

## 2021-07-30 ENCOUNTER — Encounter: Payer: Self-pay | Admitting: Skilled Nursing Facility1

## 2021-07-30 ENCOUNTER — Encounter: Payer: Medicare Other | Attending: Surgery | Admitting: Skilled Nursing Facility1

## 2021-07-30 DIAGNOSIS — Z6832 Body mass index (BMI) 32.0-32.9, adult: Secondary | ICD-10-CM | POA: Insufficient documentation

## 2021-07-30 DIAGNOSIS — E669 Obesity, unspecified: Secondary | ICD-10-CM | POA: Insufficient documentation

## 2021-07-30 DIAGNOSIS — Z713 Dietary counseling and surveillance: Secondary | ICD-10-CM | POA: Diagnosis not present

## 2021-07-30 NOTE — Progress Notes (Signed)
? ?Office Visit Note ? ?Patient: Jillian [name]             ?Date of Birth: 05-03-1955           ?MRN: [id number]             ?PCP: Jinny Sanders, MD ?Referring: Jinny Sanders, MD ?Visit Date: 08/13/2021 ?Occupation: '@GUAROCC'$ @ ? ?Subjective:  ?Medication monitoring  ? ?History of Present Illness: Jillian [name] is a 66 y.o. adult with history of psoriatic arthritis and osteoarthritis.  She was started on taltz on 07/02/21.  She has been tolerating Taltz without any side effects or injection site reactions.  She states that she has noticed about a 60 to 70% improvement in her skin clearance since initiating therapy.  She states that the psoriasis on the palmar aspect of her hands has completely resolved.  She continues to have some psoriasis on her scalp and in the genital region.  She requested a refill of Lidex external solution to be sent to the pharmacy for her scalp.  She states that she continues to have mild intermittent discomfort in both SI joints.  She is having some discomfort and stiffness in both shoulders especially her right shoulder.  She denies any Achilles tendinitis or plantar fasciitis.  She denies any joint swelling at this time. ?Patient reports that she is currently being treated for UTI with Macrobid.  She states that she forgot that she was to hold told that she had signs or symptoms of an infection.  Her most recent dose of Donnetta Hail was today. ? ? ? ? ?Activities of Daily Living:  ?Patient reports morning stiffness for 3 hours.   ?Patient Reports nocturnal pain.  ?Difficulty dressing/grooming: Denies ?Difficulty climbing stairs: Denies ?Difficulty getting out of chair: Denies ?Difficulty using hands for taps, buttons, cutlery, and/or writing: Denies ? ?Review of Systems  ?Constitutional:  Positive for fatigue.  ?HENT:  Positive for mouth dryness.   ?Eyes:  Positive for dryness.  ?Respiratory:  Negative for shortness of breath.   ?Cardiovascular:  Negative for swelling in legs/feet.   ?Gastrointestinal:  Positive for constipation.  ?Endocrine: Positive for heat intolerance.  ?Genitourinary:  Positive for difficulty urinating and painful urination.  ?Musculoskeletal:  Positive for joint pain, gait problem, joint pain, morning stiffness and muscle tenderness.  ?Skin:  Positive for rash.  ?Allergic/Immunologic: Negative for susceptible to infections.  ?Neurological:  Negative for numbness.  ?Hematological:  Negative for bruising/bleeding tendency.  ?Psychiatric/Behavioral:  Negative for sleep disturbance.   ? ?PMFS History:  ?Patient Active Problem List  ? Diagnosis Date Noted  ? Morbid obesity ([place]) 06/03/2021  ? Psoriatic arthritis ([place]) 05/07/2021  ? Psoriasis 05/07/2021  ? Primary osteoarthritis of both hands 05/07/2021  ? Primary osteoarthritis of both feet 05/07/2021  ? Encounter for weight management 02/14/2021  ? Pitting of nails 01/23/2020  ? OSA (obstructive sleep apnea) 04/12/2019  ? S/P left unicompartmental knee replacement 03/21/2019  ? Osteoarthritis of left knee 03/09/2019  ? Primary localized osteoarthritis of right knee 02/07/2019  ? Status post right partial knee replacement 02/07/2019  ? Class 2 severe obesity due to excess calories with serious comorbidity and body mass index (BMI) of 37.0 to 37.9 in adult [facility]) 05/13/2018  ? Prediabetes 12/03/2017  ? Non-restorative sleep 02/27/2016  ? Vitamin D deficiency 10/28/2015  ? Generalized anxiety disorder 11/25/2012  ? HYPERTRIGLYCERIDEMIA 12/20/2008  ? MENOPAUSE, EARLY 10/04/2008  ? Fibromyalgia 10/04/2008  ?  ?Past Medical History:  ?Diagnosis Date  ?  Anxiety   ? Cancer [facility])   ? basal cell 2016  ? Dermatitis   ? per patient   ? Fibromyalgia   ? Primary localized osteoarthritis of right knee 02/07/2019  ? Sleep apnea   ? uses CPAP  ? UTI (urinary tract infection)   ?  ?Family History  ?Problem Relation Age of Onset  ? Cancer Father   ?     colon  ? Hyperlipidemia Father   ? Hypertension Father   ? Diabetes Sister   ? Cancer  Paternal Aunt   ?     colon  ? Cancer Maternal Grandmother   ?     colon  ? Hypertension Maternal Grandmother   ? Anuerysm Paternal Grandfather   ? Psoriasis Brother   ? Arthritis Brother   ? Psoriasis Sister   ? Osteoarthritis Brother   ? Diabetes Brother   ? Throat cancer Brother   ? Healthy Son   ? Healthy Daughter   ? ?Past Surgical History:  ?Procedure Laterality Date  ? BIOPSY  02/05/2021  ? Procedure: BIOPSY;  Surgeon: Felicie Morn, MD;  Location: [facility];  Service: General;;  ? CESAREAN SECTION    ? times 2  ? ESOPHAGOGASTRODUODENOSCOPY N/A 02/05/2021  ? Procedure: ESOPHAGOGASTRODUODENOSCOPY (EGD);  Surgeon: Felicie Morn, MD;  Location: [facility];  Service: General;  Laterality: N/A;  ? EYE SURGERY Bilateral 04/24/2020  ? bilateral eyelids   ? HERNIA REPAIR    ? PARTIAL KNEE ARTHROPLASTY Right 02/07/2019  ? Procedure: UNICOMPARTMENTAL KNEE;  Surgeon: Marchia Bond, MD;  Location: [facility];  Service: Orthopedics;  Laterality: Right;  ? PARTIAL KNEE ARTHROPLASTY Left 03/21/2019  ? Procedure: UNICOMPARTMENTAL KNEE;  Surgeon: Marchia Bond, MD;  Location: [facility];  Service: Orthopedics;  Laterality: Left;  ? UPPER GI ENDOSCOPY  03/2021  ? UPPER GI ENDOSCOPY N/A 06/03/2021  ? Procedure: UPPER GI ENDOSCOPY;  Surgeon: Felicie Morn, MD;  Location: [facility];  Service: General;  Laterality: N/A;  ? ?Social History  ? ?Social History Narrative  ? Regular exercise-yes, but seasonal walks and swims  ? Diet: healthy, low carbohydrates, no caffeine  ? ?Immunization History  ?Administered Date(s) Administered  ? Influenza-Unspecified 01/01/2020, 12/28/2020  ? Moderna Sars-Covid-2 Vaccination 05/23/2019, 07/04/2019  ? PNEUMOCOCCAL CONJUGATE-20 08/16/2020  ? Td 03/30/2004  ? Tdap 10/28/2015  ? Zoster, Live 08/30/2014  ?  ? ?Objective: ?Vital Signs: BP 112/73 (BP Location: Left Arm, Patient Position: Sitting, Cuff Size: Large)   Pulse 88   Resp 12   Ht '5\' 8"'$  (1.727 m)   Wt 208 lb 3.2 oz (94.4 kg)    BMI 31.66 kg/m?   ? ?Physical Exam ?Vitals and nursing note reviewed.  ?Constitutional:   ?   Appearance: She is well-developed.  ?HENT:  ?   Head: Normocephalic and atraumatic.  ?Eyes:  ?   Conjunctiva/sclera: Conjunctivae normal.  ?   Pupils: Pupils are equal, round, and reactive to light.  ?Cardiovascular:  ?   Rate and Rhythm: Normal rate and regular rhythm.  ?   Heart sounds: Normal heart sounds.  ?Pulmonary:  ?   Effort: Pulmonary effort is normal.  ?   Breath sounds: Normal breath sounds.  ?Abdominal:  ?   General: Bowel sounds are normal.  ?   Palpations: Abdomen is soft.  ?Musculoskeletal:  ?   Cervical back: Normal range of motion and neck supple.  ?Skin: ?   General: Skin is warm and dry.  ?  Capillary Refill: Capillary refill takes less than 2 seconds.  ?Neurological:  ?   Mental Status: She is alert and oriented to person, place, and time.  ?Psychiatric:     ?   Behavior: Behavior normal.  ?  ? ?Musculoskeletal Exam: C-spine, thoracic spine, lumbar spine have good range of motion.  Tenderness over both SI joints.  No midline spinal tenderness.  Shoulder joints have good range of motion with some tenderness over the right shoulder.  Elbow joints, wrist joints, MCPs, PIPs, DIPs have good range of motion with no synovitis.  She has PIP and DIP thickening consistent with osteoarthritis of both hands.  Complete fist formation bilaterally.  Hip joints have good range of motion with no groin pain.  Partial knee replacements have good range of motion with no warmth or effusion.  Ankle joints have good range of motion with no joint tenderness.  No evidence of Achilles tendinitis. ? ?CDAI Exam: ?CDAI Score: -- ?Patient Global: --; Provider Global: -- ?Swollen: 0 ; Tender: 3  ?Joint Exam 08/13/2021  ? ?   Right  Left  ?Glenohumeral   Tender     ?Sacroiliac   Tender   Tender  ? ? ? ?Investigation: ?No additional findings. ? ?Imaging: ?No results found. ? ?Recent Labs: ?Lab Results  ?Component Value Date  ? WBC  11.8 (H) 06/04/2021  ? HGB 12.1 06/04/2021  ? PLT 226 06/04/2021  ? NA 135 05/21/2021  ? K 4.5 05/21/2021  ? CL 103 05/21/2021  ? CO2 27 05/21/2021  ? GLUCOSE 118 (H) 05/21/2021  ? BUN 20 05/21/2021  ? CREATININE 0.90 03/0

## 2021-07-30 NOTE — Progress Notes (Signed)
Bariatric Nutrition Follow-Up Visit ?Medical Nutrition Therapy  ? ?Sleeve 06/03/2021 ? ?NUTRITION ASSESSMENT ?  ? ?Anthropometrics  ?Start weight at [facility]: 242 lbs (date: 10/16/2020) ?Today's weight: 212 pounds  ? ?Clinical  ?Medical hx: Sleep apnea, psoriasis, fibromyalgia  ?Medications: see list  ?Labs:  ?Notable signs/symptoms: chronic pain ?Any previous deficiencies? Yes: vitamin D ?  ?Body Composition Scale 06/17/2021 07/30/2021  ?Current Body Weight 230.5 212.5  ?Total Body Fat % 43.4 41.4  ?Visceral Fat 15 13  ?Fat-Free Mass % 56.5 58.5  ? Total Body Water % 42.7 43.7  ?Muscle-Mass lbs 31.4 31.1  ?BMI 35.0 32.2  ?Body Fat Displacement    ?       Torso  lbs 61.9 54.4  ?       Left Leg  lbs 12.3 10.8  ?       Right Leg  lbs 12.3 10.8  ?       Left Arm  lbs 6.1 5.4  ?       Right Arm   lbs 6.1 5.4  ? ?  ?Lifestyle & Dietary Hx ? ?Pt arrives in pain and stiff due to a previous fibro flare.  ? ?Pt states she has already introduced non starchy vegetables and fruit. Pt states is ready to add all foods back in stating this her wish aware this will slow her weight loss.   ?Pt states she is working on accepting her fibro will put her down sometimes and to take advantage of the good days with activity.  ? ?Estimated daily fluid intake: 64+ oz ?Estimated daily protein intake: 60 g ?Supplements: multi and calcium  ?Current average weekly physical activity: 2 times a week 2 hours yoga and silver sneakers, walking 40 minutes 5 days a week  ? ?24-Hr Dietary Recall ?First Meal: 2 eggs + veggie sausage ?Snack:   ?Second Meal: chicken ?Snack:   ?Third Meal: shaved beef and vegetables  ?Snack:  ?Beverages: water, 2 cups coffee + half and half ? ?Post-Op Goals/ Signs/ Symptoms ?Using straws: no ?Drinking while eating: no ?Chewing/swallowing difficulties: no ?Changes in vision: no ?Changes to mood/headaches: no ?Hair loss/changes to skin/nails: no ?Difficulty focusing/concentrating: no ?Sweating: no ?Limb weakness:  no ?Dizziness/lightheadedness: no ?Palpitations: no  ?Carbonated/caffeinated beverages: no ?N/V/D/C/Gas: no ?Abdominal pain: no ?Dumping syndrome: no ? ?  ?NUTRITION DIAGNOSIS  ?Overweight/obesity ([place]-3.3) related to past poor dietary habits and physical inactivity as evidenced by completed bariatric surgery and following dietary guidelines for continued weight loss and healthy nutrition status. ?  ?  ?NUTRITION INTERVENTION ?Nutrition counseling (C-1) and education (E-2) to facilitate bariatric surgery goals, including: ?The importance of consuming adequate calories as well as certain nutrients daily due to the body's need for essential vitamins, minerals, and fats ?The importance of daily physical activity and to reach a goal of at least 150 minutes of moderate to vigorous physical activity weekly (or as directed by their physician) due to benefits such as increased musculature and improved lab values ?The importance of intuitive eating specifically learning hunger-satiety cues and understanding the importance of learning a new body: The importance of mindful eating to avoid grazing behaviors  ?Encouraged patient to honor their body's internal hunger and fullness cues.  Throughout the day, check in mentally and rate hunger. Stop eating when satisfied not full regardless of how much food is left on the plate.  Get more if still hungry 20-30 minutes later.  The key is to honor satisfaction so throughout the meal, rate fullness factor  and stop when comfortably satisfied not physically full. The key is to honor hunger and fullness without any feelings of guilt or shame.  Pay attention to what the internal cues are, rather than any external factors. This will enhance the confidence you have in listening to your own body and following those internal cues enabling you to increase how often you eat when you are hungry not out of appetite and stop when you are satisfied not full.  ?Encouraged pt to continue to eat balanced  meals inclusive of non starchy vegetables 2 times a day 7 days a week ?Encouraged pt to choose lean protein sources: limiting beef, pork, sausage, hotdogs, and lunch meat ?Encourage pt to choose healthy fats such as plant based limiting animal fats ?Encouraged pt to continue to drink a minium 64 fluid ounces with half being plain water to satisfy proper hydration  ? ?Handouts Provided Include  ?Phase 7 folder: excluding pasta and bread as they may cause overfilling and discomfort at this point ? ?Learning Style & Readiness for Change ?Teaching method utilized: Visual & Auditory  ?Demonstrated degree of understanding via: Teach Back  ?Readiness Level: action ?Barriers to learning/adherence to lifestyle change: non identified  ? ?RD's Notes for Next Visit ?Assess adherence to pt chosen goals  ? ? ?MONITORING & EVALUATION ?Dietary intake, weekly physical activity, body weight ? ?Next Steps ?Patient is to follow-up in 4 months ?

## 2021-07-31 ENCOUNTER — Ambulatory Visit: Payer: Medicare Other | Admitting: Pulmonary Disease

## 2021-08-01 ENCOUNTER — Other Ambulatory Visit: Payer: Self-pay | Admitting: Student

## 2021-08-01 ENCOUNTER — Ambulatory Visit
Admission: RE | Admit: 2021-08-01 | Discharge: 2021-08-01 | Disposition: A | Discharge status: Home / Self Care | Payer: Medicare Other | Source: Ambulatory Visit | Attending: Surgery | Admitting: Surgery

## 2021-08-01 LAB — URINALYSIS, COMPLETE (UACMP) WITH MICROSCOPIC
Bilirubin Urine: NEGATIVE
Glucose, UA: NEGATIVE mg/dL
Ketones, ur: NEGATIVE mg/dL
Nitrite: POSITIVE — AB
Protein, ur: 30 mg/dL — AB
Specific Gravity, Urine: 1.005 (ref 1.005–1.030)
WBC, UA: >50 WBC/hpf — ABNORMAL HIGH (ref 0–5)
pH: 6 (ref 5.0–8.0)

## 2021-08-01 MED ORDER — ONDANSETRON 4 MG PO TBDP: 4.0000 mg | ORAL_TABLET | ORAL | Status: DC | PRN

## 2021-08-01 MED ORDER — THIAMINE HCL 100 MG/ML IJ SOLN
Freq: Once | INTRAVENOUS | Status: AC
  Filled 2021-08-01: qty 1000

## 2021-08-01 MED ORDER — ONDANSETRON HCL 4 MG/2ML IJ SOLN
4.0000 mg | INTRAMUSCULAR | Status: DC | PRN
Start: 2021-08-01 — End: 2021-08-02

## 2021-08-01 MED ORDER — SODIUM CHLORIDE 0.9 % IV BOLUS
1000.0000 mL | Freq: Once | INTRAVENOUS | Status: AC
  Administered 2021-08-01: 1000 mL via INTRAVENOUS

## 2021-08-04 ENCOUNTER — Other Ambulatory Visit: Payer: Self-pay | Admitting: Family Medicine

## 2021-08-04 NOTE — Telephone Encounter (Signed)
Last office visit 04/18/2021 for weight management.  Last refilled: ?Lyrica 06/27/2021 for #180 with no refills.  Tramadol 07/08/21 for #180 with no refills.  Vit D 09/18/2020 for #13 with 3 refills.  Vit D level 08/12/2020 which was low at 15.63 ng/ml.  CPE scheduled for 08/19/2021. ?

## 2021-08-05 ENCOUNTER — Ambulatory Visit: Payer: Medicare Other

## 2021-08-07 ENCOUNTER — Telehealth: Payer: Medicare Other | Admitting: Family Medicine

## 2021-08-07 DIAGNOSIS — R319 Hematuria, unspecified: Secondary | ICD-10-CM | POA: Diagnosis not present

## 2021-08-07 DIAGNOSIS — R300 Dysuria: Secondary | ICD-10-CM | POA: Diagnosis not present

## 2021-08-07 DIAGNOSIS — N390 Urinary tract infection, site not specified: Secondary | ICD-10-CM | POA: Diagnosis not present

## 2021-08-07 NOTE — Progress Notes (Signed)
The patient no-showed for appointment despite this provider sending direct link, reaching out via phone with no response and waiting for at least 10 minutes from appointment time for patient to join. They will be marked as a NS for this appointment/time.   Shannelle Alguire M Jahmiyah Dullea, NP    

## 2021-08-11 ENCOUNTER — Telehealth: Payer: Self-pay | Admitting: Family Medicine

## 2021-08-11 DIAGNOSIS — R7303 Prediabetes: Secondary | ICD-10-CM

## 2021-08-11 DIAGNOSIS — E559 Vitamin D deficiency, unspecified: Secondary | ICD-10-CM

## 2021-08-11 DIAGNOSIS — E781 Pure hyperglyceridemia: Secondary | ICD-10-CM

## 2021-08-11 NOTE — Telephone Encounter (Signed)
-----   Message from Velna Hatchet, RT sent at 07/28/2021  9:21 AM EDT ----- ?Regarding: Lab Tue 08/12/21 ?Patient is scheduled for cpx, please order future labs.  Thanks, Anda Kraft  ? ?

## 2021-08-12 ENCOUNTER — Other Ambulatory Visit: Payer: Medicare Other

## 2021-08-13 ENCOUNTER — Telehealth: Payer: Self-pay | Admitting: Family Medicine

## 2021-08-13 ENCOUNTER — Encounter: Payer: Self-pay | Admitting: Physician Assistant

## 2021-08-13 ENCOUNTER — Ambulatory Visit: Payer: Medicare Other | Admitting: Physician Assistant

## 2021-08-13 VITALS — BP 112/73 | HR 88 | Resp 12 | Ht 68.0 in | Wt 208.2 lb

## 2021-08-13 DIAGNOSIS — G4733 Obstructive sleep apnea (adult) (pediatric): Secondary | ICD-10-CM | POA: Diagnosis not present

## 2021-08-13 DIAGNOSIS — R7303 Prediabetes: Secondary | ICD-10-CM

## 2021-08-13 DIAGNOSIS — M533 Sacrococcygeal disorders, not elsewhere classified: Secondary | ICD-10-CM | POA: Diagnosis not present

## 2021-08-13 DIAGNOSIS — F411 Generalized anxiety disorder: Secondary | ICD-10-CM

## 2021-08-13 DIAGNOSIS — E559 Vitamin D deficiency, unspecified: Secondary | ICD-10-CM

## 2021-08-13 DIAGNOSIS — Z79899 Other long term (current) drug therapy: Secondary | ICD-10-CM

## 2021-08-13 DIAGNOSIS — M797 Fibromyalgia: Secondary | ICD-10-CM

## 2021-08-13 DIAGNOSIS — M19071 Primary osteoarthritis, right ankle and foot: Secondary | ICD-10-CM

## 2021-08-13 DIAGNOSIS — M19042 Primary osteoarthritis, left hand: Secondary | ICD-10-CM

## 2021-08-13 DIAGNOSIS — M19072 Primary osteoarthritis, left ankle and foot: Secondary | ICD-10-CM

## 2021-08-13 DIAGNOSIS — Z96652 Presence of left artificial knee joint: Secondary | ICD-10-CM

## 2021-08-13 DIAGNOSIS — L4050 Arthropathic psoriasis, unspecified: Secondary | ICD-10-CM | POA: Diagnosis not present

## 2021-08-13 DIAGNOSIS — Z96651 Presence of right artificial knee joint: Secondary | ICD-10-CM

## 2021-08-13 DIAGNOSIS — Z111 Encounter for screening for respiratory tuberculosis: Secondary | ICD-10-CM

## 2021-08-13 DIAGNOSIS — L608 Other nail disorders: Secondary | ICD-10-CM | POA: Diagnosis not present

## 2021-08-13 DIAGNOSIS — G478 Other sleep disorders: Secondary | ICD-10-CM

## 2021-08-13 DIAGNOSIS — L409 Psoriasis, unspecified: Secondary | ICD-10-CM

## 2021-08-13 DIAGNOSIS — Z840 Family history of diseases of the skin and subcutaneous tissue: Secondary | ICD-10-CM

## 2021-08-13 DIAGNOSIS — M19041 Primary osteoarthritis, right hand: Secondary | ICD-10-CM

## 2021-08-13 DIAGNOSIS — G8929 Other chronic pain: Secondary | ICD-10-CM

## 2021-08-13 MED ORDER — FLUOCINONIDE 0.05 % EX SOLN: 1.0000 "application " | Freq: Two times a day (BID) | CUTANEOUS | 2 refills | Status: DC | PRN

## 2021-08-13 NOTE — Telephone Encounter (Signed)
-----   Message from Ellamae Sia sent at 08/07/2021  9:55 AM EDT ----- ?Regarding: Lab orders for Thursday, 5.18.23 ?Patient is scheduled for CPX labs, please order future labs, Thanks , Terri ? ? ?

## 2021-08-14 ENCOUNTER — Other Ambulatory Visit (INDEPENDENT_AMBULATORY_CARE_PROVIDER_SITE_OTHER): Payer: Medicare Other

## 2021-08-14 ENCOUNTER — Telehealth: Payer: Self-pay | Admitting: Rheumatology

## 2021-08-14 DIAGNOSIS — E559 Vitamin D deficiency, unspecified: Secondary | ICD-10-CM

## 2021-08-14 DIAGNOSIS — R7303 Prediabetes: Secondary | ICD-10-CM

## 2021-08-14 LAB — LIPID PANEL
Cholesterol: 161 mg/dL (ref 0–200)
HDL: 34.8 mg/dL — ABNORMAL LOW (ref >39.00)
LDL Cholesterol: 90 mg/dL (ref 0–99)
NonHDL: 126.48
Total CHOL/HDL Ratio: 5
Triglycerides: 183 mg/dL — ABNORMAL HIGH (ref 0.0–149.0)
VLDL: 36.6 mg/dL (ref 0.0–40.0)

## 2021-08-14 LAB — COMPREHENSIVE METABOLIC PANEL
ALT: 22 U/L (ref 0–35)
AST: 16 U/L (ref 0–37)
Albumin: 4.1 g/dL (ref 3.5–5.2)
Alkaline Phosphatase: 101 U/L (ref 39–117)
BUN: 30 mg/dL — ABNORMAL HIGH (ref 6–23)
CO2: 29 mEq/L (ref 19–32)
Calcium: 9.8 mg/dL (ref 8.4–10.5)
Chloride: 100 mEq/L (ref 96–112)
Creatinine, Ser: 0.97 mg/dL (ref 0.40–1.20)
GFR: 60.66 mL/min (ref >60.00)
Glucose, Bld: 105 mg/dL — ABNORMAL HIGH (ref 70–99)
Potassium: 4.8 mEq/L (ref 3.5–5.1)
Sodium: 135 mEq/L (ref 135–145)
Total Bilirubin: 0.6 mg/dL (ref 0.2–1.2)
Total Protein: 6.6 g/dL (ref 6.0–8.3)

## 2021-08-14 LAB — HEMOGLOBIN A1C: Hgb A1c MFr Bld: 5.8 % (ref 4.6–6.5)

## 2021-08-14 LAB — VITAMIN D 25 HYDROXY (VIT D DEFICIENCY, FRACTURES): VITD: 40.76 ng/mL (ref 30.00–100.00)

## 2021-08-14 NOTE — Telephone Encounter (Signed)
Patient's husband Karlton Lemon called stating he was returning Andrea's call regarding Judy's labwork results.

## 2021-08-14 NOTE — Progress Notes (Signed)
CBC WNL. GFR is borderline low-59.  Creatinine WNL.  Rest of CMP WNL.  We will continue to monitor.

## 2021-08-14 NOTE — Progress Notes (Signed)
No critical labs need to be addressed urgently. We will discuss labs in detail at upcoming office visit.   

## 2021-08-17 LAB — COMPLETE METABOLIC PANEL WITH GFR
AG Ratio: 1.8 (calc) (ref 1.0–2.5)
ALT: 19 U/L (ref 6–29)
AST: 16 U/L (ref 10–35)
Albumin: 3.9 g/dL (ref 3.6–5.1)
Alkaline phosphatase (APISO): 99 U/L (ref 37–153)
BUN/Creatinine Ratio: 27 (calc) — ABNORMAL HIGH (ref 6–22)
BUN: 28 mg/dL — ABNORMAL HIGH (ref 7–25)
CO2: 24 mmol/L (ref 20–32)
Calcium: 9.9 mg/dL (ref 8.6–10.4)
Chloride: 104 mmol/L (ref 98–110)
Creat: 1.04 mg/dL (ref 0.50–1.05)
Globulin: 2.2 g/dL (calc) (ref 1.9–3.7)
Glucose, Bld: 96 mg/dL (ref 65–99)
Potassium: 4.7 mmol/L (ref 3.5–5.3)
Sodium: 138 mmol/L (ref 135–146)
Total Bilirubin: 0.3 mg/dL (ref 0.2–1.2)
Total Protein: 6.1 g/dL (ref 6.1–8.1)
eGFR: 59 mL/min/{1.73_m2} — ABNORMAL LOW (ref >=60)

## 2021-08-17 LAB — CBC WITH DIFFERENTIAL/PLATELET
Absolute Monocytes: 596 cells/uL (ref 200–950)
Basophils Absolute: 50 cells/uL (ref 0–200)
Basophils Relative: 0.6 %
Eosinophils Absolute: 118 cells/uL (ref 15–500)
Eosinophils Relative: 1.4 %
HCT: 39.3 % (ref 35.0–45.0)
Hemoglobin: 13.2 g/dL (ref 11.7–15.5)
Lymphs Abs: 1966 cells/uL (ref 850–3900)
MCH: 31.1 pg (ref 27.0–33.0)
MCHC: 33.6 g/dL (ref 32.0–36.0)
MCV: 92.5 fL (ref 80.0–100.0)
MPV: 10.2 fL (ref 7.5–12.5)
Monocytes Relative: 7.1 %
Neutro Abs: 5670 cells/uL (ref 1500–7800)
Neutrophils Relative %: 67.5 %
Platelets: 337 10*3/uL (ref 140–400)
RBC: 4.25 10*6/uL (ref 3.80–5.10)
RDW: 12.2 % (ref 11.0–15.0)
Total Lymphocyte: 23.4 %
WBC: 8.4 10*3/uL (ref 3.8–10.8)

## 2021-08-17 LAB — QUANTIFERON-TB GOLD PLUS
Mitogen-NIL: >10 IU/mL
NIL: 0.03 IU/mL
QuantiFERON-TB Gold Plus: NEGATIVE
TB1-NIL: <0 IU/mL
TB2-NIL: <0 IU/mL

## 2021-08-18 NOTE — Progress Notes (Signed)
TB gold negative

## 2021-08-19 ENCOUNTER — Encounter: Payer: Self-pay | Admitting: Family Medicine

## 2021-08-19 ENCOUNTER — Ambulatory Visit (INDEPENDENT_AMBULATORY_CARE_PROVIDER_SITE_OTHER): Payer: Medicare Other | Admitting: Family Medicine

## 2021-08-19 VITALS — BP 90/60 | HR 92 | Temp 98.4°F | Ht 67.5 in | Wt 205.5 lb

## 2021-08-19 DIAGNOSIS — Z903 Acquired absence of stomach [part of]: Secondary | ICD-10-CM

## 2021-08-19 DIAGNOSIS — E6609 Other obesity due to excess calories: Secondary | ICD-10-CM

## 2021-08-19 DIAGNOSIS — E559 Vitamin D deficiency, unspecified: Secondary | ICD-10-CM

## 2021-08-19 DIAGNOSIS — M797 Fibromyalgia: Secondary | ICD-10-CM | POA: Diagnosis not present

## 2021-08-19 DIAGNOSIS — L4050 Arthropathic psoriasis, unspecified: Secondary | ICD-10-CM

## 2021-08-19 DIAGNOSIS — R7303 Prediabetes: Secondary | ICD-10-CM | POA: Diagnosis not present

## 2021-08-19 DIAGNOSIS — Z0000 Encounter for general adult medical examination without abnormal findings: Secondary | ICD-10-CM | POA: Diagnosis not present

## 2021-08-19 DIAGNOSIS — Z6831 Body mass index (BMI) 31.0-31.9, adult: Secondary | ICD-10-CM

## 2021-08-19 NOTE — Assessment & Plan Note (Signed)
Improving s/p gastric sleeve.

## 2021-08-19 NOTE — Assessment & Plan Note (Signed)
Moderate control with recent flare in setting of UTI.  Continue cymbalta 90 mg daily and lyrica 150 mg BID.  She is going to try to wean down to tramadol 2 tabs BID ( down from TID) if able.

## 2021-08-19 NOTE — Assessment & Plan Note (Signed)
Stable, chronic.  Continue current medication.   Vit D 50,000 unit.. S/P sleeve gastrectomy continue high dose

## 2021-08-19 NOTE — Patient Instructions (Addendum)
Try to wean down on the tramadol to 2 tabs twice daily over the next month.  Please call the location of your choice from the menu below to schedule your Mammogram  appointment.    [place] at [facility], [place]   Phone:  [phone]   [place] [address], [place] [address]                                            Services: 3D Mammogram and [place]  [place] at [facility])  Phone:  [phone]   [address] Room [place],  [address]                                              Services:  3D Mammogram and Bone Density

## 2021-08-19 NOTE — Assessment & Plan Note (Signed)
Followed by rheumatology. 

## 2021-08-19 NOTE — Assessment & Plan Note (Signed)
S/P gastric sleeve. Encouraged exercise, weight loss, healthy eating habits.  She will return in 1 month for  Vitamin evaluation.. vit B12, folate,and iron.   Uptodate recommends: labs every 3 months for the first year, every 6 months for the second year, and annually thereafter

## 2021-08-19 NOTE — Progress Notes (Addendum)
Patient ID: NOLYN EILERT, adult    DOB: 04-26-1955, 66 y.o.   MRN: [id number]  This visit was conducted in person.  BP 90/60   Pulse 92   Temp 98.4 F (36.9 C) (Oral)   Ht 5' 7.5" (1.715 m)   Wt 205 lb 8 oz (93.2 kg)   SpO2 95%   BMI 31.71 kg/m    CC:  Chief Complaint  Patient presents with   Medicare Wellness    Subjective:   HPI: Jillian [name] is a 66 y.o. adult presenting on 08/19/2021 for Medicare Wellness   The patient presents for annual medicare wellness, complete physical and review of chronic health problems. He/She also has the following acute concerns today:  I have personally reviewed the Medicare Annual Wellness questionnaire and have noted 1. The patient's medical and social history 2. Their use of alcohol, tobacco or illicit drugs 3. Their current medications and supplements 4. The patient's functional ability including ADL's, fall risks, home safety risks and hearing or visual             impairment. 5. Diet and physical activities 6. Evidence for depression or mood disorders 7.         Updated provider list Cognitive evaluation was performed and recorded on pt medicare questionnaire form. The patients weight, height, BMI and visual acuity have been recorded in the chart   I have made referrals, counseling and provided education to the patient based review of the above and I have provided the pt with a written personalized care plan for preventive services.   Documentation of this information was scanned into the electronic record under the media tab.   Hearing Screening  Method: Audiometry   '500Hz'$  '1000Hz'$  '2000Hz'$  '4000Hz'$   Right ear 0 40 20 40  Left ear 0 '20 20 20  '$ Vision Screening - Comments:: Wears Glasses-Eye Exam with [place] 08/2020  No falls in last 12 months.  [place] Office Visit from 08/19/2021 in [place] at [facility] Total Score 0       Advance directives and end of life planning reviewed in  detail with patient and documented in EMR. Patient given handout on advance care directives if needed. HCPOA and living will updated if needed.     She is continuing to lose weight s/p sleeve gastrectomy in 05/2021, Starting weight 246  She is on bariatric vitamin with iron.  She has lost a total of 41 lbs  Only issue since has had constipation.Marland Kitchen trying to get water intake and use Miralax. Exercising 2 times a weight.. silver sneakers and chair yoga. Body mass index is 31.71 kg/m. Wt Readings from Last 3 Encounters:  08/19/21 205 lb 8 oz (93.2 kg)  08/13/21 208 lb 3.2 oz (94.4 kg)  07/30/21 212 lb 8 oz (96.4 kg)    Having issues with UTI: now on antibiotics.  Psoriatic arthritis, fibromyalgia: followed by rheum. On talz. She would like to decrease tramadol to 2 twice daily as control is good. She  will continue cymbalta 90 mg daily, lyrica 150 mg BID.  Prediabetes  Lab Results  Component Value Date   HGBA1C 5.8 08/14/2021   Vit D: good control on supplement 50, 000 unit weekly.       Relevant past medical, surgical, family and social history reviewed and updated as indicated. Interim medical history since our last visit reviewed. Allergies and medications reviewed and updated. Outpatient Medications Prior to Visit  Medication  Sig Dispense Refill   augmented betamethasone dipropionate (DIPROLENE-AF) 0.05 % ointment Apply topically 2 (two) times daily as needed. 50 g 0   buPROPion (WELLBUTRIN XL) 150 MG 24 hr tablet TAKE 1 TABLET BY MOUTH  DAILY 90 tablet 1   Calcium Carbonate (CALCIUM 500 PO) Take 1,000 mg by mouth daily.     DULoxetine (CYMBALTA) 30 MG capsule TAKE 3 CAPSULES BY MOUTH DAILY 270 capsule 1   fluocinonide (LIDEX) 0.05 % external solution Apply 1 application. topically 2 (two) times daily as needed (PSORIASIS). 60 mL 2   Ixekizumab (TALTZ) 80 MG/ML SOAJ Inject '160mg'$  into the skin at Week 0, then '80mg'$  into the skin at [facility] 2, 4, 6, 8, 10, 12, then '80mg'$  into the skin  every 4 weeks thereafter. (Patient taking differently: Inject 160 mg into the skin every 28 (twenty-eight) days. Inject '160mg'$  into the skin at Week 0, then '80mg'$  into the skin at [facility] 2, 4, 6, 8, 10, 12, then '80mg'$  into the skin every 4 weeks thereafter.) 8 mL 0   methocarbamol (ROBAXIN) 500 MG tablet TAKE 1 TABLET BY MOUTH 3  TIMES DAILY AS NEEDED FOR  MUSCLE SPASMS 270 tablet 0   Multiple Vitamins-Minerals (BARIATRIC MULTIVITAMINS/IRON PO) Take 4 tablets by mouth daily.     pregabalin (LYRICA) 150 MG capsule TAKE 1 CAPSULE BY MOUTH TWICE  DAILY 180 capsule 0   traMADol (ULTRAM) 50 MG tablet TAKE 2 TABLETS BY MOUTH 3 TIMES  DAILY 180 tablet 0   Vitamin D, Ergocalciferol, (DRISDOL) 1.25 MG (50000 UNIT) CAPS capsule TAKE 1 CAPSULE BY MOUTH  EVERY 7 DAYS 13 capsule 2   nitrofurantoin, macrocrystal-monohydrate, (MACROBID) 100 MG capsule Take 100 mg by mouth 2 (two) times daily.     pantoprazole (PROTONIX) 40 MG tablet Take 1 tablet by mouth daily. (Patient not taking: Reported on 08/13/2021) 90 tablet 0   No facility-administered medications prior to visit.     Per HPI unless specifically indicated in ROS section below Review of Systems  Constitutional:  Negative for fatigue and fever.  HENT:  Negative for ear pain.   Eyes:  Negative for pain.  Respiratory:  Negative for cough and shortness of breath.   Cardiovascular:  Negative for chest pain, palpitations and leg swelling.  Gastrointestinal:  Negative for abdominal pain.  Genitourinary:  Negative for dysuria.  Musculoskeletal:  Negative for arthralgias.  Neurological:  Negative for syncope, light-headedness and headaches.  Psychiatric/Behavioral:  Negative for dysphoric mood.   Objective:  BP 90/60   Pulse 92   Temp 98.4 F (36.9 C) (Oral)   Ht 5' 7.5" (1.715 m)   Wt 205 lb 8 oz (93.2 kg)   SpO2 95%   BMI 31.71 kg/m   Wt Readings from Last 3 Encounters:  08/19/21 205 lb 8 oz (93.2 kg)  08/13/21 208 lb 3.2 oz (94.4 kg)  07/30/21 212  lb 8 oz (96.4 kg)      Physical Exam Vitals and nursing note reviewed.  Constitutional:      General: She is not in acute distress.    Appearance: Normal appearance. She is well-developed. She is not ill-appearing or toxic-appearing.  HENT:     Head: Normocephalic.     Right Ear: Hearing, tympanic membrane, ear canal and external ear normal.     Left Ear: Hearing, tympanic membrane, ear canal and external ear normal.     Nose: Nose normal.  Eyes:     General: Lids are normal. Lids are everted,  no foreign bodies appreciated.     Conjunctiva/sclera: Conjunctivae normal.     Pupils: Pupils are equal, round, and reactive to light.  Neck:     Thyroid: No thyroid mass or thyromegaly.     Vascular: No carotid bruit.     Trachea: Trachea normal.  Cardiovascular:     Rate and Rhythm: Normal rate and regular rhythm.     Heart sounds: Normal heart sounds, S1 normal and S2 normal. No murmur heard.   No gallop.  Pulmonary:     Effort: Pulmonary effort is normal. No respiratory distress.     Breath sounds: Normal breath sounds. No wheezing, rhonchi or rales.  Abdominal:     General: Bowel sounds are normal. There is no distension or abdominal bruit.     Palpations: Abdomen is soft. There is no fluid wave or mass.     Tenderness: There is no abdominal tenderness. There is no guarding or rebound.     Hernia: No hernia is present.  Musculoskeletal:     Cervical back: Normal range of motion and neck supple.  Lymphadenopathy:     Cervical: No cervical adenopathy.  Skin:    General: Skin is warm and dry.     Findings: No rash.  Neurological:     Mental Status: She is alert.     Cranial Nerves: No cranial nerve deficit.     Sensory: No sensory deficit.  Psychiatric:        Mood and Affect: Mood is not anxious or depressed.        Speech: Speech normal.        Behavior: Behavior normal. Behavior is cooperative.        Judgment: Judgment normal.      Results for orders placed or performed  in visit on 08/14/21  Hemoglobin A1c  Result Value Ref Range   Hgb A1c MFr Bld 5.8 4.6 - 6.5 %  Lipid panel  Result Value Ref Range   Cholesterol 161 0 - 200 mg/dL   Triglycerides 183.0 (H) 0.0 - 149.0 mg/dL   HDL 34.80 (L) >39.00 mg/dL   VLDL 36.6 0.0 - 40.0 mg/dL   LDL Cholesterol 90 0 - 99 mg/dL   Total CHOL/HDL Ratio 5    NonHDL 126.48   Comprehensive metabolic panel  Result Value Ref Range   Sodium 135 135 - 145 mEq/L   Potassium 4.8 3.5 - 5.1 mEq/L   Chloride 100 96 - 112 mEq/L   CO2 29 19 - 32 mEq/L   Glucose, Bld 105 (H) 70 - 99 mg/dL   BUN 30 (H) 6 - 23 mg/dL   Creatinine, Ser 0.97 0.40 - 1.20 mg/dL   Total Bilirubin 0.6 0.2 - 1.2 mg/dL   Alkaline Phosphatase 101 39 - 117 U/L   AST 16 0 - 37 U/L   ALT 22 0 - 35 U/L   Total Protein 6.6 6.0 - 8.3 g/dL   Albumin 4.1 3.5 - 5.2 g/dL   GFR 60.66 >60.00 mL/min   Calcium 9.8 8.4 - 10.5 mg/dL  VITAMIN D 25 Hydroxy (Vit-D Deficiency, Fractures)  Result Value Ref Range   VITD 40.76 30.00 - 100.00 ng/mL     COVID 19 screen:  No recent travel or known exposure to COVID19 The patient denies respiratory symptoms of COVID 19 at this time. The importance of social distancing was discussed today.   Assessment and Plan   The patient's preventative maintenance and recommended screening tests for an annual wellness  exam were reviewed in full today. Brought up to date unless services declined.  Counselled on the importance of diet, exercise, and its role in overall health and mortality. The patient's FH and SH was reviewed, including their home life, tobacco status, and drug and alcohol status.   Vaccines: Uptodate  Tdap,  given PNA 20 and rx for Shingrix given in past has had #1  Mammo: plans every 2 year. Mammogram: 12/2018.Marland Kitchen due  DVE/pap:nml pap/dve nml , neg co-testing 2017,   no further indicated Colon: Father with colon cancer age 61.. Colonoscopy 12/26/2010: no polyps  09/2016  Incomplete prep... Repeated 09/2017 Dr.  Vira Agar.. repeat planned 5 years Nonsmoker   Bone density: 01/2021  osteopenia -1.1  Nonsmoker  Hep C: neg   Problem List Items Addressed This Visit     Class 1 obesity due to excess calories with serious comorbidity and body mass index (BMI) of 31.0 to 31.9 in adult   Fibromyalgia    Moderate control with recent flare in setting of UTI.  Continue cymbalta 90 mg daily and lyrica 150 mg BID.  She is going to try to wean down to tramadol 2 tabs BID ( down from TID) if able.       Prediabetes    Improving s/p gastric sleeve.       Psoriatic arthritis ([place])    Followed by rheumatology.        Vitamin D deficiency    Stable, chronic.  Continue current medication.   Vit D 50,000 unit.. S/P sleeve gastrectomy continue high dose      Other Visit Diagnoses     Medicare annual wellness visit, subsequent    -  Primary   H/O gastric sleeve       Relevant Orders   Vitamin B12   Folate   IBC Panel([facility])   PTH, Intact and Calcium   TSH   Phosphorus      Eliezer Lofts, MD

## 2021-08-31 ENCOUNTER — Other Ambulatory Visit: Payer: Self-pay | Admitting: Rheumatology

## 2021-08-31 DIAGNOSIS — Z79899 Other long term (current) drug therapy: Secondary | ICD-10-CM

## 2021-08-31 DIAGNOSIS — L409 Psoriasis, unspecified: Secondary | ICD-10-CM

## 2021-08-31 DIAGNOSIS — L4050 Arthropathic psoriasis, unspecified: Secondary | ICD-10-CM

## 2021-08-31 NOTE — Telephone Encounter (Signed)
Next Visit: 11/14/2021  Last Visit: 5/17/223  Last Fill: 07/02/2021  DX: Psoriatic arthritis   Current Dose per office note 08/13/2021: Taltz 80 mg sq injections every 4 weeks  Labs: 08/14/2021, Glucose 105, BUN 30, Triglycerides 183, HDL 34.80, 08/13/2021, CBC WNL. GFR is borderline low-59.  Creatinine WNL.  Rest of CMP WNL.  We will continue to monitor.   TB Gold: 08/13/2021 TB gold negative.   Okay to refill Taltz?

## 2021-09-03 DIAGNOSIS — M545 Low back pain, unspecified: Secondary | ICD-10-CM | POA: Diagnosis not present

## 2021-09-04 ENCOUNTER — Telehealth: Payer: Self-pay

## 2021-09-04 MED ORDER — TRAMADOL HCL 50 MG PO TABS: ORAL_TABLET | ORAL | 0 refills | Status: DC

## 2021-09-04 NOTE — Telephone Encounter (Signed)
MEDICATION: traMADol (ULTRAM) 50 MG tablet  PHARMACY: [place], [address] AT [place]  Comments: Patient has only a couple of days left. States that [employer] has tried contacting us but has had no response.   **Let patient know to contact pharmacy at the end of the day to make sure medication is ready. **  ** Please notify patient to allow 48-72 hours to process**  **Encourage patient to contact the pharmacy for refills or they can request refills through [facility]**

## 2021-09-04 NOTE — Telephone Encounter (Signed)
Last office visit 08/19/21 for [name].  Last refilled 08/04/21 for #180 with no refills.  No future appointment with PCP.

## 2021-09-05 MED ORDER — TRAMADOL HCL 50 MG PO TABS: ORAL_TABLET | ORAL | 0 refills | Status: DC

## 2021-09-05 NOTE — Telephone Encounter (Signed)
Refill sent to wrong pharmacy.  Please resend to [facility] on [address].

## 2021-09-09 ENCOUNTER — Other Ambulatory Visit: Payer: Self-pay | Admitting: Family Medicine

## 2021-09-11 ENCOUNTER — Other Ambulatory Visit: Payer: Self-pay | Admitting: Family Medicine

## 2021-09-19 ENCOUNTER — Other Ambulatory Visit (INDEPENDENT_AMBULATORY_CARE_PROVIDER_SITE_OTHER): Payer: Medicare Other

## 2021-09-19 DIAGNOSIS — E559 Vitamin D deficiency, unspecified: Secondary | ICD-10-CM | POA: Diagnosis not present

## 2021-09-19 DIAGNOSIS — E781 Pure hyperglyceridemia: Secondary | ICD-10-CM

## 2021-09-19 DIAGNOSIS — R7303 Prediabetes: Secondary | ICD-10-CM | POA: Diagnosis not present

## 2021-09-19 DIAGNOSIS — Z903 Acquired absence of stomach [part of]: Secondary | ICD-10-CM | POA: Diagnosis not present

## 2021-09-19 LAB — COMPREHENSIVE METABOLIC PANEL
ALT: 21 U/L (ref 0–35)
AST: 18 U/L (ref 0–37)
Albumin: 4.3 g/dL (ref 3.5–5.2)
Alkaline Phosphatase: 76 U/L (ref 39–117)
BUN: 22 mg/dL (ref 6–23)
CO2: 28 mEq/L (ref 19–32)
Calcium: 9.8 mg/dL (ref 8.4–10.5)
Chloride: 102 mEq/L (ref 96–112)
Creatinine, Ser: 0.84 mg/dL (ref 0.40–1.20)
GFR: 72.04 mL/min (ref >60.00)
Glucose, Bld: 96 mg/dL (ref 70–99)
Potassium: 4.5 mEq/L (ref 3.5–5.1)
Sodium: 137 mEq/L (ref 135–145)
Total Bilirubin: 0.4 mg/dL (ref 0.2–1.2)
Total Protein: 6.4 g/dL (ref 6.0–8.3)

## 2021-09-19 LAB — IBC PANEL
Iron: 168 ug/dL — ABNORMAL HIGH (ref 42–145)
Saturation Ratios: 51.3 % — ABNORMAL HIGH (ref 20.0–50.0)
TIBC: 327.6 ug/dL (ref 250.0–450.0)
Transferrin: 234 mg/dL (ref 212.0–360.0)

## 2021-09-19 LAB — LIPID PANEL
Cholesterol: 188 mg/dL (ref 0–200)
HDL: 42.2 mg/dL (ref >39.00)
LDL Cholesterol: 109 mg/dL — ABNORMAL HIGH (ref 0–99)
NonHDL: 146.04
Total CHOL/HDL Ratio: 4
Triglycerides: 185 mg/dL — ABNORMAL HIGH (ref 0.0–149.0)
VLDL: 37 mg/dL (ref 0.0–40.0)

## 2021-09-19 LAB — FOLATE: Folate: >24.2 ng/mL (ref >5.9)

## 2021-09-19 LAB — VITAMIN D 25 HYDROXY (VIT D DEFICIENCY, FRACTURES): VITD: 46.25 ng/mL (ref 30.00–100.00)

## 2021-09-19 LAB — PHOSPHORUS: Phosphorus: 3.4 mg/dL (ref 2.3–4.6)

## 2021-09-19 LAB — HEMOGLOBIN A1C: Hgb A1c MFr Bld: 5.6 % (ref 4.6–6.5)

## 2021-09-19 LAB — VITAMIN B12: Vitamin B-12: 1029 pg/mL — ABNORMAL HIGH (ref 211–911)

## 2021-09-19 LAB — TSH: TSH: 2.64 u[IU]/mL (ref 0.35–5.50)

## 2021-09-21 LAB — PTH, INTACT AND CALCIUM
Calcium: 9.6 mg/dL (ref 8.6–10.4)
PTH: 29 pg/mL (ref 16–77)

## 2021-10-02 ENCOUNTER — Other Ambulatory Visit: Payer: Self-pay | Admitting: Family Medicine

## 2021-10-03 NOTE — Telephone Encounter (Signed)
Last office visit 08/19/21 for [place].  Last refilled 09/05/2021 for #180 with no refills.  No future appointments with PCP.

## 2021-10-30 ENCOUNTER — Other Ambulatory Visit: Payer: Self-pay | Admitting: Family Medicine

## 2021-10-30 NOTE — Telephone Encounter (Signed)
Last office visit 08/19/21 for [place].   Last refilled 10/03/21 for #180 with no refills.  No future appointments with PCP.

## 2021-10-31 NOTE — Progress Notes (Signed)
Office Visit Note  Patient: Jillian [name]             Date of Birth: Apr 24, 1955           MRN: [id number]             PCP: Jinny Sanders, MD Referring: Jinny Sanders, MD Visit Date: 11/14/2021 Occupation: '@GUAROCC' @  Subjective:  Medication management  History of Present Illness: Jillian [name] is a 66 y.o. adult with history of psoriatic arthritis, psoriasis and osteoarthritis  She has been taking Taltz 80 mg subcu daily.  She states just prior to her next shot she starts noticing has some breakout on her hands.  She also notices some rash on her scalp for which she uses Lidex solution.  She continues to have some discomfort in her knee joints which have been replaced.  She has a stiffness in her hands and her feet.  She also has some stiffness in her SI joints.  She has not noticed any joint pain.  She had gastric bypass surgery on June 03, 2021.  She had some hair loss after the surgery.  Activities of Daily Living:  Patient reports morning stiffness for several hours.   Patient Reports nocturnal pain.  Difficulty dressing/grooming: Denies Difficulty climbing stairs: Denies Difficulty getting out of chair: Denies Difficulty using hands for taps, buttons, cutlery, and/or writing: Denies  Review of Systems  Constitutional:  Positive for fatigue.  HENT:  Negative for mouth sores and mouth dryness.   Eyes:  Negative for dryness.  Respiratory:  Negative for shortness of breath.   Cardiovascular:  Negative for chest pain and palpitations.  Gastrointestinal:  Negative for blood in stool, constipation and diarrhea.  Endocrine: Negative for increased urination.  Genitourinary:  Negative for involuntary urination.  Musculoskeletal:  Positive for joint pain, joint pain, myalgias, morning stiffness, muscle tenderness and myalgias. Negative for gait problem, joint swelling and muscle weakness.  Skin:  Positive for rash and hair loss. Negative for color change and sensitivity to sunlight.   Allergic/Immunologic: Negative for susceptible to infections.  Neurological:  Negative for dizziness and headaches.  Hematological:  Negative for swollen glands.  Psychiatric/Behavioral:  Negative for depressed mood and sleep disturbance. The patient is not nervous/anxious.     PMFS History:  Patient Active Problem List   Diagnosis Date Noted   Psoriatic arthritis ([place]) 05/07/2021   Psoriasis 05/07/2021   Primary osteoarthritis of both hands 05/07/2021   Primary osteoarthritis of both feet 05/07/2021   Encounter for weight management 02/14/2021   Pitting of nails 01/23/2020   OSA (obstructive sleep apnea) 04/12/2019   S/P left unicompartmental knee replacement 03/21/2019   Osteoarthritis of left knee 03/09/2019   Primary localized osteoarthritis of right knee 02/07/2019   Status post right partial knee replacement 02/07/2019   Class 1 obesity due to excess calories with serious comorbidity and body mass index (BMI) of 31.0 to 31.9 in adult 05/13/2018   Prediabetes 12/03/2017   Non-restorative sleep 02/27/2016   Vitamin D deficiency 10/28/2015   Generalized anxiety disorder 11/25/2012   HYPERTRIGLYCERIDEMIA 12/20/2008   MENOPAUSE, EARLY 10/04/2008   Fibromyalgia 10/04/2008    Past Medical History:  Diagnosis Date   Anxiety    Cancer ([place])    basal cell 2016   Dermatitis    per patient    Fibromyalgia    Primary localized osteoarthritis of right knee 02/07/2019   Sleep apnea    uses CPAP   UTI (urinary tract  infection)     Family History  Problem Relation Age of Onset   Cancer Father        colon   Hyperlipidemia Father    Hypertension Father    Diabetes Sister    Cancer Paternal Aunt        colon   Cancer Maternal Grandmother        colon   Hypertension Maternal Grandmother    Anuerysm Paternal Grandfather    Psoriasis Brother    Arthritis Brother    Psoriasis Sister    Osteoarthritis Brother    Diabetes Brother    Throat cancer Brother    Healthy Son     Healthy Daughter    Past Surgical History:  Procedure Laterality Date   BIOPSY  02/05/2021   Procedure: BIOPSY;  Surgeon: Stechschulte, Nickola Major, MD;  Location: [facility];  Service: General;;   CESAREAN SECTION     times 2   ESOPHAGOGASTRODUODENOSCOPY N/A 02/05/2021   Procedure: ESOPHAGOGASTRODUODENOSCOPY (EGD);  Surgeon: Felicie Morn, MD;  Location: [facility];  Service: General;  Laterality: N/A;   EYE SURGERY Bilateral 04/24/2020   bilateral eyelids    GASTRIC BYPASS  06/03/2021   HERNIA REPAIR     PARTIAL KNEE ARTHROPLASTY Right 02/07/2019   Procedure: UNICOMPARTMENTAL KNEE;  Surgeon: Marchia Bond, MD;  Location: [facility];  Service: Orthopedics;  Laterality: Right;   PARTIAL KNEE ARTHROPLASTY Left 03/21/2019   Procedure: UNICOMPARTMENTAL KNEE;  Surgeon: Marchia Bond, MD;  Location: [facility];  Service: Orthopedics;  Laterality: Left;   UPPER GI ENDOSCOPY  03/2021   UPPER GI ENDOSCOPY N/A 06/03/2021   Procedure: UPPER GI ENDOSCOPY;  Surgeon: Felicie Morn, MD;  Location: [facility];  Service: General;  Laterality: N/A;   Social History   Social History Narrative   Regular exercise-yes, but seasonal walks and swims   Diet: healthy, low carbohydrates, no caffeine   Immunization History  Administered Date(s) Administered   Influenza-Unspecified 01/01/2020, 12/28/2020   Moderna Sars-Covid-2 Vaccination 05/23/2019, 07/04/2019   PNEUMOCOCCAL CONJUGATE-20 08/16/2020   Td 03/30/2004   Tdap 10/28/2015   Zoster, Live 08/30/2014     Objective: Vital Signs: BP 120/76 (BP Location: Left Arm, Patient Position: Sitting, Cuff Size: Normal)   Pulse 79   Ht '5\' 8"'  (1.727 m)   Wt 199 lb 9.6 oz (90.5 kg)   BMI 30.35 kg/m    Physical Exam Vitals and nursing note reviewed.  Constitutional:      Appearance: She is well-developed.  HENT:     Head: Normocephalic and atraumatic.  Eyes:     Conjunctiva/sclera: Conjunctivae normal.  Cardiovascular:     Rate and Rhythm:  Normal rate and regular rhythm.     Heart sounds: Normal heart sounds.  Pulmonary:     Effort: Pulmonary effort is normal.     Breath sounds: Normal breath sounds.  Abdominal:     General: Bowel sounds are normal.     Palpations: Abdomen is soft.  Musculoskeletal:     Cervical back: Normal range of motion.  Lymphadenopathy:     Cervical: No cervical adenopathy.  Skin:    General: Skin is warm and dry.     Capillary Refill: Capillary refill takes less than 2 seconds.  Neurological:     Mental Status: She is alert and oriented to person, place, and time.  Psychiatric:        Behavior: Behavior normal.      Musculoskeletal Exam: Cervical spine was in good range  of motion.  She had no tenderness over thoracic and lumbar spine with good mobility.  She had some tenderness over SI joints and gluteal region.  Shoulder joints, elbow joints, wrist joints with good range of motion.  She had bilateral PIP and DIP thickening with no synovitis.  Hip joints and knee joints with good range of motion.  She had no tenderness over ankles or MTPs.  There was no tenderness over Achilles tendon or plantar fascia. CDAI Exam: CDAI Score: -- Patient Global: --; Provider Global: -- Swollen: --; Tender: -- Joint Exam 11/14/2021   No joint exam has been documented for this visit   There is currently no information documented on the homunculus. Go to the Rheumatology activity and complete the homunculus joint exam.  Investigation: No additional findings.  Imaging: No results found.  Recent Labs: Lab Results  Component Value Date   WBC 8.4 08/13/2021   HGB 13.2 08/13/2021   PLT 337 08/13/2021   NA 137 09/19/2021   K 4.5 09/19/2021   CL 102 09/19/2021   CO2 28 09/19/2021   GLUCOSE 96 09/19/2021   BUN 22 09/19/2021   CREATININE 0.84 09/19/2021   BILITOT 0.4 09/19/2021   ALKPHOS 76 09/19/2021   AST 18 09/19/2021   ALT 21 09/19/2021   PROT 6.4 09/19/2021   ALBUMIN 4.3 09/19/2021   CALCIUM 9.8  09/19/2021   CALCIUM 9.6 09/19/2021   GFRAA 83 07/16/2020   QFTBGOLDPLUS NEGATIVE 08/13/2021    Speciality Comments: tremfya -started September 16, 2020; Taltz started 07/02/21  Procedures:  No procedures performed Allergies: Patient has no known allergies.   Assessment / Plan:     Visit Diagnoses: Psoriatic arthritis ([place]) -inflammatory arthritis, psoriasis, plantar fasciitis, fingernail pitting, RF-, HLA-B27-, chronic SI joint pain, family history of psoriasis and psoriatic arthritis: Patient is doing much better since she started Taltz.  She is been on Taltz since July 02, 2021.  She has noticed improvement in psoriasis and joint pain and swelling.  She continues to have some joint stiffness in her hands and her feet.  She states her knee joints continue to hurt.  She has not noticed any joint swelling.  She is tolerating Taltz without any side effects.  She notices some recurrence of rash prior to her next injection.  Psoriasis - Diagnosed by Dr. Venetia Maxon.  Dry skin was noted over bilateral arms.  No active psoriasis lesions were noted.  She still has off-and-on rash on her scalp.  She has been using Lidex solution which is helpful.  High risk medication use - Taltz 80 mg sq injections every 4 weeks since July 02, 2021, previously on tremfya.  Labs obtained on Aug 13, 2021 CBC was normal.  September 19, 2001 CMP was normal.  TB gold was negative on Aug 13, 2021.  She was advised to get repeat labs in September and then every 3 months to monitor for drug toxicity.  Information on immunization was placed in the AVS.  She was also advised to hold SHE develops an infection and resume after the infection resolves.  Pitting of nails-improved.  Primary osteoarthritis of both hands -she continues to have some stiffness in her hands.  PIP and DIP thickening was noted.  No active synovitis was noted.  Radiographic findings on 04/08/2020 were consistent with osteoarthritic changes.  Status post right partial knee  replacement - Dr. Silva Bandy on 02/07/2019.  She continues to have chronic pain in her knee joints.  S/P left unicompartmental knee replacement -  Dr. Silva Bandy on 03/21/2019.    Primary osteoarthritis of both feet -no synovitis was noted.  She had no tenderness on the examination.  There was no evidence of [place] fasciitis or proximal tendinitis.  Radiographic findings from 04/08/2020 were consistent with osteoarthritic changes in both feet.   Chronic SI joint pain -she complains of SI joint discomfort.  She has some tenderness in the SI joint region and also in the gluteal region most likely due to fibromyalgia.  X-rays of the pelvis were unremarkable on 04/08/2020.  Family history of psoriatic arthritis - Sister and brother   Fibromyalgia -she continues to have some generalized pain and discomfort from fibromyalgia.  She is on Lyrica 150 mg twice daily, methocarbamol 500 mg 3 times daily as needed for muscle spasms, Cymbalta 30 mg, 3 capsules by mouth daily, and tramadol prescribed by PCP.  Non-restorative sleep-  OSA (obstructive sleep apnea)-she states that insomnia has improved.  Prediabetes  Generalized anxiety disorder  Status post gastric bypass surgery for obesity-she had gastric bypass on June 03, 2021.  She reports increased hair loss since then.  She has been using topical Rogaine which helps.  Vitamin D deficiency-vitamin D was 46.25 on September 19, 2021.  She was advised to continue current dose of vitamin D.  Osteopenia of multiple sites -she had a DEXA scan on January 29, 2021 DEXA scan showed T score of -1.1 in the left femoral neck.  DEXA scan findings were reviewed with the patient.  Use of calcium and vitamin D was discussed.  Resistive exercises were emphasized.  Orders: No orders of the defined types were placed in this encounter.  No orders of the defined types were placed in this encounter.    Follow-Up Instructions: Return in about 5 months (around  04/16/2022) for Psoriatic arthritis.   Bo Merino, MD  Note - This record has been created using Editor, commissioning.  Chart creation errors have been sought, but may not always  have been located. Such creation errors do not reflect on  the standard of medical care.

## 2021-11-14 ENCOUNTER — Encounter: Payer: Self-pay | Admitting: Rheumatology

## 2021-11-14 ENCOUNTER — Ambulatory Visit: Payer: Medicare Other | Attending: Rheumatology | Admitting: Rheumatology

## 2021-11-14 VITALS — BP 120/76 | HR 79 | Ht 68.0 in | Wt 199.6 lb

## 2021-11-14 DIAGNOSIS — Z96652 Presence of left artificial knee joint: Secondary | ICD-10-CM

## 2021-11-14 DIAGNOSIS — G478 Other sleep disorders: Secondary | ICD-10-CM

## 2021-11-14 DIAGNOSIS — Z9884 Bariatric surgery status: Secondary | ICD-10-CM

## 2021-11-14 DIAGNOSIS — Z96651 Presence of right artificial knee joint: Secondary | ICD-10-CM | POA: Diagnosis not present

## 2021-11-14 DIAGNOSIS — M19041 Primary osteoarthritis, right hand: Secondary | ICD-10-CM

## 2021-11-14 DIAGNOSIS — G4733 Obstructive sleep apnea (adult) (pediatric): Secondary | ICD-10-CM

## 2021-11-14 DIAGNOSIS — Z79899 Other long term (current) drug therapy: Secondary | ICD-10-CM

## 2021-11-14 DIAGNOSIS — L409 Psoriasis, unspecified: Secondary | ICD-10-CM

## 2021-11-14 DIAGNOSIS — Z840 Family history of diseases of the skin and subcutaneous tissue: Secondary | ICD-10-CM | POA: Diagnosis not present

## 2021-11-14 DIAGNOSIS — M19072 Primary osteoarthritis, left ankle and foot: Secondary | ICD-10-CM

## 2021-11-14 DIAGNOSIS — M19071 Primary osteoarthritis, right ankle and foot: Secondary | ICD-10-CM

## 2021-11-14 DIAGNOSIS — M533 Sacrococcygeal disorders, not elsewhere classified: Secondary | ICD-10-CM

## 2021-11-14 DIAGNOSIS — F411 Generalized anxiety disorder: Secondary | ICD-10-CM

## 2021-11-14 DIAGNOSIS — M797 Fibromyalgia: Secondary | ICD-10-CM | POA: Diagnosis not present

## 2021-11-14 DIAGNOSIS — L608 Other nail disorders: Secondary | ICD-10-CM | POA: Diagnosis not present

## 2021-11-14 DIAGNOSIS — L4050 Arthropathic psoriasis, unspecified: Secondary | ICD-10-CM

## 2021-11-14 DIAGNOSIS — M19042 Primary osteoarthritis, left hand: Secondary | ICD-10-CM

## 2021-11-14 DIAGNOSIS — E559 Vitamin D deficiency, unspecified: Secondary | ICD-10-CM

## 2021-11-14 DIAGNOSIS — R7303 Prediabetes: Secondary | ICD-10-CM

## 2021-11-14 DIAGNOSIS — M8589 Other specified disorders of bone density and structure, multiple sites: Secondary | ICD-10-CM

## 2021-11-14 DIAGNOSIS — G8929 Other chronic pain: Secondary | ICD-10-CM

## 2021-11-14 NOTE — Patient Instructions (Signed)
Standing Labs We placed an order today for your standing lab work.   Please have your standing labs drawn in  September and every 3 months  If possible, please have your labs drawn 2 weeks prior to your appointment so that the provider can discuss your results at your appointment.  Please note that you may see your imaging and lab results in [facility] before we have reviewed them. We may be awaiting multiple results to interpret others before contacting you. Please allow our office up to 72 hours to thoroughly review all of the results before contacting the office for clarification of your results.  We currently have open lab daily: Monday through Thursday from 1:30 PM-4:30 PM and Friday from 1:30 PM- 4:00 PM If possible, please come for your lab work on Monday, Thursday or Friday afternoons, as you may experience shorter wait times.   Effective January 28, 2022 the new lab hours will change to: Monday through Thursday from 1:30 PM-5:00 PM and Friday from 8:30 AM-12:00 PM If possible, please come for your lab work on Monday and Thursday afternoons, as you may experience shorter wait times.  Please be advised, all patients with office appointments requiring lab work will take precedent over walk-in lab work.    The office is located at [address] [place], [place], [place] [address] No appointment is necessary.   Labs are drawn by [facility]. Please bring your co-pay at the time of your lab draw.  You may receive a bill from [facility] for your lab work.  Please note if you are on Hydroxychloroquine and and an order has been placed for a Hydroxychloroquine level, you will need to have it drawn 4 hours or more after your last dose.  If you wish to have your labs drawn at another location, please call the office 24 hours in advance to send orders.  If you have any questions regarding directions or hours of operation,  please call [phone].   As a reminder, please drink plenty of water prior  to coming for your lab work. Thanks!   Vaccines You are taking a medication(s) that can suppress your immune system.  The following immunizations are recommended: Flu annually Covid-19  Td/Tdap (tetanus, diphtheria, pertussis) every 10 years Pneumonia (Prevnar 15 then Pneumovax 23 at least 1 year apart.  Alternatively, can take Prevnar 20 without needing additional dose) Shingrix: 2 doses from 4 weeks to 6 months apart  Please check with your PCP to make sure you are up to date.   If you have signs or symptoms of an infection or start antibiotics: First, call your PCP for workup of your infection. Hold your medication through the infection, until you complete your antibiotics, and until symptoms resolve if you take the following: Injectable medication (Actemra, Benlysta, Cimzia, Cosentyx, Enbrel, Humira, Kevzara, Orencia, Remicade, Simponi, Stelara, Taltz, Tremfya) Methotrexate Leflunomide (Arava) Mycophenolate (Cellcept) Morrie Sheldon, Olumiant, or Rinvoq

## 2021-11-18 ENCOUNTER — Ambulatory Visit: Payer: Medicare Other | Admitting: Rheumatology

## 2021-11-21 ENCOUNTER — Telehealth: Payer: Self-pay

## 2021-11-21 NOTE — Telephone Encounter (Signed)
Jillian [name] would like to update pharmacy, now goes to [facility] Address: [address] [place], [place], [place] [address]

## 2021-11-21 NOTE — Telephone Encounter (Signed)
Pharmacy updated in chart

## 2021-11-25 ENCOUNTER — Other Ambulatory Visit: Payer: Self-pay | Admitting: Family Medicine

## 2021-11-25 NOTE — Telephone Encounter (Signed)
Last office visit 08/19/21 for [place].  Last refilled 10/30/21 for #180 with no refills.  No future appointments with PCP.

## 2021-12-02 ENCOUNTER — Ambulatory Visit: Payer: Medicare Other | Admitting: Skilled Nursing Facility1

## 2021-12-05 ENCOUNTER — Other Ambulatory Visit: Payer: Self-pay | Admitting: Family Medicine

## 2021-12-05 DIAGNOSIS — G4733 Obstructive sleep apnea (adult) (pediatric): Secondary | ICD-10-CM | POA: Diagnosis not present

## 2021-12-05 NOTE — Telephone Encounter (Signed)
Last office visit 08/19/21 for [place].  Last refilled 08/04/21 for #180 with no refills.  No future appointments with PCP.

## 2021-12-16 ENCOUNTER — Other Ambulatory Visit: Payer: Self-pay | Admitting: Family Medicine

## 2021-12-21 ENCOUNTER — Other Ambulatory Visit: Payer: Self-pay | Admitting: Physician Assistant

## 2021-12-21 DIAGNOSIS — Z79899 Other long term (current) drug therapy: Secondary | ICD-10-CM

## 2021-12-21 DIAGNOSIS — L4050 Arthropathic psoriasis, unspecified: Secondary | ICD-10-CM

## 2021-12-21 DIAGNOSIS — L409 Psoriasis, unspecified: Secondary | ICD-10-CM

## 2021-12-22 ENCOUNTER — Other Ambulatory Visit: Payer: Self-pay | Admitting: Family Medicine

## 2021-12-22 NOTE — Telephone Encounter (Signed)
Last office visit 08/19/21 for [place].  Last refilled 11/25/21 for #180 with no refills.  No future appointments with PCP.

## 2021-12-22 NOTE — Telephone Encounter (Signed)
Next Visit: 04/17/2022  Last Visit: 11/14/2021  Last Fill: 09/01/2021  TX:MIWOEHOZY arthritis   Current Dose per office note 11/14/2021: Taltz 80 mg sq injections every 4 weeks   Labs: 09/19/2021 CMP WNL, 08/13/2021 WNL  TB Gold: 08/13/2021 Neg    Patient advised she is due to update labs.   Okay to refill Taltz?

## 2022-01-14 ENCOUNTER — Other Ambulatory Visit: Payer: Self-pay | Admitting: Family Medicine

## 2022-01-19 ENCOUNTER — Other Ambulatory Visit: Payer: Self-pay | Admitting: Family Medicine

## 2022-01-19 NOTE — Telephone Encounter (Signed)
Last office visit 08/19/21 for Jillian [name].  Last refilled 12/22/21 for #180 with no refills.  No future appointments with PCP.

## 2022-01-28 ENCOUNTER — Telehealth: Payer: Self-pay | Admitting: Pharmacist

## 2022-01-28 ENCOUNTER — Encounter: Payer: Medicare Other | Attending: Surgery | Admitting: Skilled Nursing Facility1

## 2022-01-28 ENCOUNTER — Encounter: Payer: Self-pay | Admitting: Skilled Nursing Facility1

## 2022-01-28 DIAGNOSIS — Z6832 Body mass index (BMI) 32.0-32.9, adult: Secondary | ICD-10-CM | POA: Diagnosis not present

## 2022-01-28 DIAGNOSIS — G4730 Sleep apnea, unspecified: Secondary | ICD-10-CM | POA: Insufficient documentation

## 2022-01-28 DIAGNOSIS — E669 Obesity, unspecified: Secondary | ICD-10-CM

## 2022-01-28 DIAGNOSIS — Z713 Dietary counseling and surveillance: Secondary | ICD-10-CM | POA: Diagnosis not present

## 2022-01-28 DIAGNOSIS — M797 Fibromyalgia: Secondary | ICD-10-CM | POA: Diagnosis not present

## 2022-01-28 NOTE — Progress Notes (Signed)
Bariatric Nutrition Follow-Up Visit Medical Nutrition Therapy   Sleeve 06/03/2021  NUTRITION ASSESSMENT    Anthropometrics  Start weight at [facility]: 242 lbs (date: 10/16/2020) Today's weight: 212 pounds   Clinical  Medical hx: Sleep apnea, psoriasis, fibromyalgia  Medications: see list  Labs:  Notable signs/symptoms: chronic pain Any previous deficiencies? Yes: vitamin D   Body Composition Scale 06/17/2021 07/30/2021  Current Body Weight 230.5 212.5  Total Body Fat % 43.4 41.4  Visceral Fat 15 13  Fat-Free Mass % 56.5 58.5   Total Body Water % 42.7 43.7  Muscle-Mass lbs 31.4 31.1  BMI 35.0 32.2  Body Fat Displacement           Torso  lbs 61.9 54.4         Left Leg  lbs 12.3 10.8         Right Leg  lbs 12.3 10.8         Left Arm  lbs 6.1 5.4         Right Arm   lbs 6.1 5.4     Lifestyle & Dietary Hx  Pt states she does eat her food very quickly struggling to slow it down. Pt states her husband tries to remind her to slow down when eating. Pt states she is realizing she is very anxious when she sits down to a meal.  Pt states breaking food rules mentally affect causing her stress.  Pt states [facility] her mother with dementia live with her so she cannot leave.  Pt states she feels so much better and is very happy with her weight loss.    Estimated daily fluid intake: 64+ oz Estimated daily protein intake: 60 g Supplements: multi and calcium  Current average weekly physical activity: 30 minute walk daily   24-Hr Dietary Recall First Meal: egg + orange + pumpernickle toast Snack:   Second Meal: chicken noodle soup and half ham sandwich Snack:   Third Meal: shaved beef and vegetables or chicken fajita with peppers and onions  Snack:  Beverages: water, 2 cups coffee + half and half  Post-Op Goals/ Signs/ Symptoms Using straws: no Drinking while eating: no Chewing/swallowing difficulties: no Changes in vision: no Changes to mood/headaches:  no Hair loss/changes to skin/nails: no Difficulty focusing/concentrating: no Sweating: no Limb weakness: no Dizziness/lightheadedness: no Palpitations: no  Carbonated/caffeinated beverages: no N/V/D/C/Gas: no Abdominal pain: no Dumping syndrome: no    NUTRITION DIAGNOSIS  Overweight/obesity ([place]-3.3) related to past poor dietary habits and physical inactivity as evidenced by completed bariatric surgery and following dietary guidelines for continued weight loss and healthy nutrition status.     NUTRITION INTERVENTION Nutrition counseling (C-1) and education (E-2) to facilitate bariatric surgery goals, including: The importance of consuming adequate calories as well as certain nutrients daily due to the body's need for essential vitamins, minerals, and fats The importance of daily physical activity and to reach a goal of at least 150 minutes of moderate to vigorous physical activity weekly (or as directed by their physician) due to benefits such as increased musculature and improved lab values The importance of intuitive eating specifically learning hunger-satiety cues and understanding the importance of learning a new body: The importance of mindful eating to avoid grazing behaviors  Encouraged patient to honor their body's internal hunger and fullness cues.  Throughout the day, check in mentally and rate hunger. Stop eating when satisfied not full regardless of how much food is left on the plate.  Get more if still hungry 20-30 minutes later.  The key is to honor satisfaction so throughout the meal, rate fullness factor and stop when comfortably satisfied not physically full. The key is to honor hunger and fullness without any feelings of guilt or shame.  Pay attention to what the internal cues are, rather than any external factors. This will enhance the confidence you have in listening to your own body and following those internal cues enabling you to increase how often you eat when you are  hungry not out of appetite and stop when you are satisfied not full.  Encouraged pt to continue to eat balanced meals inclusive of non starchy vegetables 2 times a day 7 days a week Encouraged pt to choose lean protein sources: limiting beef, pork, sausage, hotdogs, and lunch meat Encourage pt to choose healthy fats such as plant based limiting animal fats Encouraged pt to continue to drink a minium 64 fluid ounces with half being plain water to satisfy proper hydration    Learning Style & Readiness for Change Teaching method utilized: Visual & Auditory  Demonstrated degree of understanding via: Teach Back  Readiness Level: action Barriers to learning/adherence to lifestyle change: non identified   RD's Notes for Next Visit Assess adherence to pt chosen goals    MONITORING & EVALUATION Dietary intake, weekly physical activity, body weight  Next Steps Patient is to follow-up in 4 months

## 2022-01-28 NOTE — Telephone Encounter (Signed)
Received fax from [facility] that they are taking 2024 patient assistance renewal application. Patient receives TALTZ through patient assistance program. Patient states she prefers application to be emailed. Confirmed email and emailed to her today. She will complete her portion and return via email    Provider portion placed in Dr. Arlean Hopping folder to be signed   Knox Saliva, PharmD, MPH, BCPS, CPP Clinical Pharmacist (Rheumatology and Pulmonology)

## 2022-02-02 NOTE — Telephone Encounter (Signed)
Signed provider form for Taltz [employer] PAP received. Submission pending patient portion  Knox Saliva, PharmD, MPH, BCPS, CPP Clinical Pharmacist (Rheumatology and Pulmonology)

## 2022-02-13 NOTE — Telephone Encounter (Signed)
Emailed application to patient again. Received confirmation back from patient stating she's received application  Knox Saliva, PharmD, MPH, BCPS, CPP Clinical Pharmacist (Rheumatology and Pulmonology)

## 2022-02-13 NOTE — Telephone Encounter (Signed)
Patient requesting return call, Patient has not received email.

## 2022-02-14 ENCOUNTER — Other Ambulatory Visit: Payer: Self-pay | Admitting: Family Medicine

## 2022-02-15 NOTE — Telephone Encounter (Signed)
Last office visit 08/19/21 for [place].  Last refilled 01/20/22 for #180 with no refills.  No future appointments with PCP.

## 2022-02-23 ENCOUNTER — Other Ambulatory Visit: Payer: Self-pay | Admitting: Family Medicine

## 2022-02-23 NOTE — Telephone Encounter (Signed)
Last office visit 08/19/2021 for Jillian [name].  Last refilled 08/04/21 for #13 with 2 refills.  Last Vit D level 09/19/21 which was normal at 46.25 ng/mL.  No future appointments.

## 2022-02-23 NOTE — Telephone Encounter (Signed)
Received patient portion (no income documents). Pending income information. Left VM for patient requesting return call.  Knox Saliva, PharmD, MPH, BCPS, CPP Clinical Pharmacist (Rheumatology and Pulmonology)

## 2022-02-25 NOTE — Telephone Encounter (Signed)
Left VM for patient requesting household income information (size and income pre-tax) to write down on app before submission.  Knox Saliva, PharmD, MPH, BCPS, CPP Clinical Pharmacist (Rheumatology and Pulmonology)

## 2022-03-02 NOTE — Telephone Encounter (Signed)
Called patient and confirmed income information. PAP submission is pending PA renewal. Submitted a Prior Authorization renewal request to [facility] for TALTZ via CoverMyMeds. Will update once we receive a response.  Key: [id number]  Per automated response: This medication or product was previously approved on [id number] from 2022-03-02 to 2023-03-30.  Knox Saliva, PharmD, MPH, BCPS, CPP Clinical Pharmacist (Rheumatology and Pulmonology)

## 2022-03-03 NOTE — Telephone Encounter (Signed)
Submitted Patient Assistance RENEWAL Application to [facility] for TALTZ along with provider portion, patient portion, med list, insurance card copy, and PA. Will update patient when we receive a response.  Fax# [phone] Phone# [phone] [id number]  Knox Saliva, PharmD, MPH, BCPS, CPP Clinical Pharmacist (Rheumatology and Pulmonology)

## 2022-03-04 NOTE — Telephone Encounter (Signed)
Received a fax from  [facility] regarding an approval for [place] patient assistance from 03/30/2022 to 03/30/2023. Approval letter sent to scan center.  Phone number: [phone]  Knox Saliva, PharmD, MPH, BCPS, CPP Clinical Pharmacist (Rheumatology and Pulmonology)

## 2022-03-15 ENCOUNTER — Other Ambulatory Visit: Payer: Self-pay | Admitting: Family Medicine

## 2022-03-15 NOTE — Telephone Encounter (Signed)
Last office visit 08/19/21 for [place].  Last 02/17/22 for #180 with no refills.  No future appointments with PCP.

## 2022-03-18 ENCOUNTER — Other Ambulatory Visit: Payer: Self-pay | Admitting: Family Medicine

## 2022-04-03 ENCOUNTER — Other Ambulatory Visit (HOSPITAL_COMMUNITY): Payer: Self-pay

## 2022-04-03 NOTE — Progress Notes (Signed)
Office Visit Note  Patient: Jillian [name]             Date of Birth: 03-Sep-1955           MRN: [id number]             PCP: Jinny Sanders, MD Referring: Jinny Sanders, MD Visit Date: 04/17/2022 Occupation: '@GUAROCC'$ @  Subjective:  Medication monitoring   History of Present Illness: Jillian [name] is a 67 y.o. adult with history of psoriatic arthritis.  Patient is currently on Taltz 80 mg sq injections every 4 weeks since July 02, 2021.  Patient has been tolerating the office without any side effects or injection site reactions.  She has not missed any doses of Taltz recently.  Patient reports that she has noticed a 99% improvement in her symptoms since initiating Taltz.  Her skin clearance of psoriasis has been impressive especially on the plantar aspect of both of her hands.  She states that she has noticed some dryness and itching at the lateral canthus of her left eye which she has been concerned his psoriasis.  She denies any eye pain, photophobia, or eye redness. She has occasional joint stiffness but denies any joint swelling.  She has been seeing a chiropractor 3 days a week for management of sciatica.  She denies any Achilles tendinitis or plantar fasciitis.   Activities of Daily Living:  Patient reports morning stiffness for several hours.   Patient Reports nocturnal pain.  Difficulty dressing/grooming: Denies Difficulty climbing stairs: Denies Difficulty getting out of chair: Denies Difficulty using hands for taps, buttons, cutlery, and/or writing: Denies  Review of Systems  Constitutional:  Negative for fatigue.  HENT:  Negative for mouth sores, mouth dryness and nose dryness.   Eyes:  Positive for dryness. Negative for pain and visual disturbance.  Respiratory:  Negative for cough, hemoptysis, shortness of breath and difficulty breathing.   Cardiovascular:  Negative for chest pain, palpitations, hypertension and swelling in legs/feet.  Gastrointestinal:  Positive for  constipation. Negative for blood in stool and diarrhea.  Endocrine: Negative for increased urination.  Genitourinary:  Negative for painful urination and involuntary urination.  Musculoskeletal:  Positive for myalgias, morning stiffness, muscle tenderness and myalgias. Negative for joint pain, gait problem, joint pain, joint swelling and muscle weakness.  Skin:  Positive for rash and hair loss. Negative for color change, pallor, nodules/bumps, skin tightness, ulcers and sensitivity to sunlight.  Allergic/Immunologic: Negative for susceptible to infections.  Neurological:  Negative for dizziness, numbness, headaches and weakness.  Hematological:  Negative for swollen glands.  Psychiatric/Behavioral:  Negative for depressed mood and sleep disturbance. The patient is not nervous/anxious.     PMFS History:  Patient Active Problem List   Diagnosis Date Noted   Psoriatic arthritis ([place]) 05/07/2021   Psoriasis 05/07/2021   Primary osteoarthritis of both hands 05/07/2021   Primary osteoarthritis of both feet 05/07/2021   Encounter for weight management 02/14/2021   Pitting of nails 01/23/2020   OSA (obstructive sleep apnea) 04/12/2019   S/P left unicompartmental knee replacement 03/21/2019   Osteoarthritis of left knee 03/09/2019   Primary localized osteoarthritis of right knee 02/07/2019   Status post right partial knee replacement 02/07/2019   Class 1 obesity due to excess calories with serious comorbidity and body mass index (BMI) of 31.0 to 31.9 in adult 05/13/2018   Prediabetes 12/03/2017   Non-restorative sleep 02/27/2016   Vitamin D deficiency 10/28/2015   Generalized anxiety disorder 11/25/2012   HYPERTRIGLYCERIDEMIA  12/20/2008   MENOPAUSE, EARLY 10/04/2008   Fibromyalgia 10/04/2008    Past Medical History:  Diagnosis Date   Anxiety    Cancer ([place])    basal cell 2016   Dermatitis    per patient    Fibromyalgia    Prediabetes 12/03/2017   Primary localized osteoarthritis of  right knee 02/07/2019   Sleep apnea    uses CPAP   UTI (urinary tract infection)     Family History  Problem Relation Age of Onset   Cancer Father        colon   Hyperlipidemia Father    Hypertension Father    Diabetes Sister    Cancer Paternal Aunt        colon   Cancer Maternal Grandmother        colon   Hypertension Maternal Grandmother    Anuerysm Paternal Grandfather    Psoriasis Brother    Arthritis Brother    Psoriasis Sister    Osteoarthritis Brother    Diabetes Brother    Throat cancer Brother    Healthy Son    Healthy Daughter    Past Surgical History:  Procedure Laterality Date   BIOPSY  02/05/2021   Procedure: BIOPSY;  Surgeon: Stechschulte, Nickola Major, MD;  Location: [facility];  Service: General;;   CESAREAN SECTION     times 2   ESOPHAGOGASTRODUODENOSCOPY N/A 02/05/2021   Procedure: ESOPHAGOGASTRODUODENOSCOPY (EGD);  Surgeon: Felicie Morn, MD;  Location: [facility];  Service: General;  Laterality: N/A;   EYE SURGERY Bilateral 04/24/2020   bilateral eyelids    GASTRIC BYPASS  06/03/2021   HERNIA REPAIR     PARTIAL KNEE ARTHROPLASTY Right 02/07/2019   Procedure: UNICOMPARTMENTAL KNEE;  Surgeon: Marchia Bond, MD;  Location: [facility];  Service: Orthopedics;  Laterality: Right;   PARTIAL KNEE ARTHROPLASTY Left 03/21/2019   Procedure: UNICOMPARTMENTAL KNEE;  Surgeon: Marchia Bond, MD;  Location: [facility];  Service: Orthopedics;  Laterality: Left;   UPPER GI ENDOSCOPY  03/2021   UPPER GI ENDOSCOPY N/A 06/03/2021   Procedure: UPPER GI ENDOSCOPY;  Surgeon: Felicie Morn, MD;  Location: [facility];  Service: General;  Laterality: N/A;   Social History   Social History Narrative   Regular exercise-yes, but seasonal walks and swims   Diet: healthy, low carbohydrates, no caffeine   Immunization History  Administered Date(s) Administered   Influenza-Unspecified 01/01/2020, 12/28/2020   Moderna Sars-Covid-2 Vaccination 05/23/2019, 07/04/2019    PNEUMOCOCCAL CONJUGATE-20 08/16/2020   Td 03/30/2004   Tdap 10/28/2015   Zoster, Live 08/30/2014     Objective: Vital Signs: BP 109/73 (BP Location: Left Arm, Patient Position: Sitting, Cuff Size: Normal)   Pulse 73   Resp 16   Ht '5\' 8"'$  (1.727 m)   Wt 192 lb 3.2 oz (87.2 kg)   BMI 29.22 kg/m    Physical Exam Vitals and nursing note reviewed.  Constitutional:      Appearance: She is well-developed.  HENT:     Head: Normocephalic and atraumatic.  Eyes:     General:        Left eye: No discharge.     Conjunctiva/sclera: Conjunctivae normal.     Pupils: Pupils are equal, round, and reactive to light.     Comments: No conjunctival injection noted.   Cardiovascular:     Rate and Rhythm: Normal rate and regular rhythm.     Heart sounds: Normal heart sounds.  Pulmonary:     Effort: Pulmonary effort is normal.  Breath sounds: Normal breath sounds.  Abdominal:     General: Bowel sounds are normal.     Palpations: Abdomen is soft.  Musculoskeletal:     Cervical back: Normal range of motion and neck supple.  Skin:    General: Skin is warm and dry.     Capillary Refill: Capillary refill takes less than 2 seconds.     Comments: Palmar psoriasis has cleared   Neurological:     Mental Status: She is alert and oriented to person, place, and time.  Psychiatric:        Behavior: Behavior normal.      Musculoskeletal Exam: C-spine has good range of motion.  No midline spinal tenderness.  Tenderness over the left SI joint noted.  Tenderness over the left piriformis.  Shoulder joints, elbow joints, wrist joints, MCPs, PIPs, DIPs have good range of motion with no synovitis.  PIP and DIP thickening noted.  Complete fist formation bilaterally.  Hip joints have good range of motion with no groin pain.  Bilateral partial knee replacements have good range of motion with no discomfort.  No warmth or effusion noted. Ankle joints have good range of motion with no tenderness or synovitis.  No  evidence of Achilles tendinitis or plantar fasciitis.  Some thickening over bilateral first MTP joints.  No tenderness or synovitis over MTP joints.  CDAI Exam: CDAI Score: -- Patient Global: --; Provider Global: -- Swollen: --; Tender: -- Joint Exam 04/17/2022   No joint exam has been documented for this visit   There is currently no information documented on the homunculus. Go to the Rheumatology activity and complete the homunculus joint exam.  Investigation: No additional findings.  Imaging: No results found.  Recent Labs: Lab Results  Component Value Date   WBC 8.4 08/13/2021   HGB 13.2 08/13/2021   PLT 337 08/13/2021   NA 137 09/19/2021   K 4.5 09/19/2021   CL 102 09/19/2021   CO2 28 09/19/2021   GLUCOSE 96 09/19/2021   BUN 22 09/19/2021   CREATININE 0.84 09/19/2021   BILITOT 0.4 09/19/2021   ALKPHOS 76 09/19/2021   AST 18 09/19/2021   ALT 21 09/19/2021   PROT 6.4 09/19/2021   ALBUMIN 4.3 09/19/2021   CALCIUM 9.8 09/19/2021   CALCIUM 9.6 09/19/2021   GFRAA 83 07/16/2020   QFTBGOLDPLUS NEGATIVE 08/13/2021    Speciality Comments: tremfya -started September 16, 2020; Taltz started 07/02/21  Procedures:  No procedures performed Allergies: Patient has no known allergies.   Assessment / Plan:     Visit Diagnoses: Psoriatic arthritis ([place]) - inflammatory arthritis, psoriasis, plantar fasciitis, fingernail pitting, RF-, HLA-B27-, chronic SI joint pain, family hx of psoriasis and psoriatic arthritis: She has no synovitis or dactylitis on examination today.  No evidence of Achilles tendinitis or plantar fasciitis.  No signs or symptoms of uveitis.  She has mild tenderness over the left sacroiliac joint.  She has not been experiencing any difficulty with ADLs.  The psoriasis on the palmar aspect of both of her hands has resolved.  She has noticed about a 99% improvement in her symptoms since initiating Taltz on 07/02/2021.  She has been tolerating Taltz without any side effects or  injection site reactions.  She will remain on Taltz as monotherapy.  She was advised to notify us if she develops signs or symptoms of a flare.  She will follow-up in the office in 3 months or sooner if needed.  Psoriasis - Diagnosed by Dr. Venetia Maxon.  Palmar psoriasis has cleared since initiating Taltz.  Fingernail pitting is improving.  High risk medication use - Taltz 80 mg sq injections every 4 weeks.  Patient initiated Donnetta Hail on 07/02/2021. Previous therapy includes: Tremfya TB gold negative on 08/13/21.  Future order for TB Gold placed today. Orders for CBC and CMP released today.  Her next lab work will be due in April and every 3 months to monitor for drug toxicity.  Standing orders for CBC and CMP were placed today. No recent or recurrent infections.  Discussed the importance of holding taltz if she develops signs or symptoms of an infection and to resume once the infection has completely cleared.  Denies any new medical conditions. - Plan: COMPLETE METABOLIC PANEL WITH GFR, CBC with Differential/Platelet, QuantiFERON-TB Gold Plus, CBC with Differential/Platelet, COMPLETE METABOLIC PANEL WITH GFR  Screening for tuberculosis -Future order for TB Gold placed today.  Plan: QuantiFERON-TB Gold Plus  Pitting of nails: Improving since initiating Taltz.  Primary osteoarthritis of both hands - Radiographic findings on 04/08/2020 were consistent with osteoarthritic changes.  Status post right partial knee replacement - Dr. Silva Bandy on 02/07/2019.  Doing well.  Good range of motion with no discomfort.  S/P left unicompartmental knee replacement - Dr. Silva Bandy on 03/21/2019.  Doing well.  Good range of motion with no discomfort.  No warmth or effusion noted.  Primary osteoarthritis of both feet - Radiographic findings from 04/08/2020 were consistent with osteoarthritic changes in both feet.  No tenderness or synovitis over MTP joints.  Thickening of her bilateral first MTP joints  noted.  No evidence of Achilles tendinitis or plantar fasciitis.  Good range of motion of both ankle joints with no tenderness or synovitis.  Patient is wearing proper fitting shoes.  Chronic SI joint pain - X-rays of the pelvis were unremarkable on 04/08/2020.  Mild tenderness over the left sacroiliac joint noted today.  She has not been experiencing nocturnal pain or morning stiffness.  She has been seeing a chiropractor 3 times a week for management of the left-sided sciatica.  Family history of psoriatic arthritis - Sister and brother  Fibromyalgia -She remains on Lyrica 150 mg twice daily, methocarbamol 500 mg 3 times daily as needed for muscle spasms, Cymbalta 30 mg,3 capsules by mouth daily, tramadol prescribed by PCP.  Osteopenia of multiple sites - DEXA scan on January 29, 2021 DEXA scan showed T score of -1.1 in the left femoral neck.  Due to update DEXA November 2024.  Other medical conditions are listed as follows:  Non-restorative sleep  Prediabetes  OSA (obstructive sleep apnea)  Generalized anxiety disorder  Vitamin D deficiency  Status post gastric bypass for obesity - She had gastric bypass on June 03, 2021.    Orders: Orders Placed This Encounter  Procedures   COMPLETE METABOLIC PANEL WITH GFR   CBC with Differential/Platelet   QuantiFERON-TB Gold Plus   CBC with Differential/Platelet   COMPLETE METABOLIC PANEL WITH GFR   No orders of the defined types were placed in this encounter.    Follow-Up Instructions: Return in about 3 months (around 07/17/2022) for Psoriatic arthritis.   Ofilia Neas, PA-C  Note - This record has been created using Dragon software.  Chart creation errors have been sought, but may not always  have been located. Such creation errors do not reflect on  the standard of medical care.

## 2022-04-05 ENCOUNTER — Telehealth: Payer: Self-pay | Admitting: Pharmacist

## 2022-04-05 NOTE — Telephone Encounter (Signed)
Submitted a Prior Authorization RENEWAL request to [facility] for TALTZ via CoverMyMeds. Will update once we receive a response.  Key: [id number]  Per automated response: This medication or product was previously approved on [id number] from 2022-03-02 to 2023-03-30  Knox Saliva, PharmD, MPH, BCPS, CPP Clinical Pharmacist (Rheumatology and Pulmonology)

## 2022-04-08 DIAGNOSIS — M5416 Radiculopathy, lumbar region: Secondary | ICD-10-CM | POA: Diagnosis not present

## 2022-04-08 DIAGNOSIS — M9903 Segmental and somatic dysfunction of lumbar region: Secondary | ICD-10-CM | POA: Diagnosis not present

## 2022-04-08 DIAGNOSIS — M62830 Muscle spasm of back: Secondary | ICD-10-CM | POA: Diagnosis not present

## 2022-04-08 DIAGNOSIS — M9904 Segmental and somatic dysfunction of sacral region: Secondary | ICD-10-CM | POA: Diagnosis not present

## 2022-04-10 DIAGNOSIS — M62830 Muscle spasm of back: Secondary | ICD-10-CM | POA: Diagnosis not present

## 2022-04-10 DIAGNOSIS — M5416 Radiculopathy, lumbar region: Secondary | ICD-10-CM | POA: Diagnosis not present

## 2022-04-10 DIAGNOSIS — M9904 Segmental and somatic dysfunction of sacral region: Secondary | ICD-10-CM | POA: Diagnosis not present

## 2022-04-10 DIAGNOSIS — M9903 Segmental and somatic dysfunction of lumbar region: Secondary | ICD-10-CM | POA: Diagnosis not present

## 2022-04-11 ENCOUNTER — Other Ambulatory Visit: Payer: Self-pay | Admitting: Family Medicine

## 2022-04-12 NOTE — Telephone Encounter (Signed)
Last office visit 08/19/21 for [place].  Last refilled Tramadol 03/16/22 for #180 with no refills. Lyrica 12/05/21 for #180 with 1 refill.  No future appointment with PCP.

## 2022-04-13 DIAGNOSIS — M5416 Radiculopathy, lumbar region: Secondary | ICD-10-CM | POA: Diagnosis not present

## 2022-04-13 DIAGNOSIS — M9904 Segmental and somatic dysfunction of sacral region: Secondary | ICD-10-CM | POA: Diagnosis not present

## 2022-04-13 DIAGNOSIS — M62830 Muscle spasm of back: Secondary | ICD-10-CM | POA: Diagnosis not present

## 2022-04-13 DIAGNOSIS — M9903 Segmental and somatic dysfunction of lumbar region: Secondary | ICD-10-CM | POA: Diagnosis not present

## 2022-04-15 DIAGNOSIS — M62830 Muscle spasm of back: Secondary | ICD-10-CM | POA: Diagnosis not present

## 2022-04-15 DIAGNOSIS — M5416 Radiculopathy, lumbar region: Secondary | ICD-10-CM | POA: Diagnosis not present

## 2022-04-15 DIAGNOSIS — M9904 Segmental and somatic dysfunction of sacral region: Secondary | ICD-10-CM | POA: Diagnosis not present

## 2022-04-15 DIAGNOSIS — M9903 Segmental and somatic dysfunction of lumbar region: Secondary | ICD-10-CM | POA: Diagnosis not present

## 2022-04-17 ENCOUNTER — Encounter: Payer: Self-pay | Admitting: Physician Assistant

## 2022-04-17 ENCOUNTER — Ambulatory Visit: Payer: Medicare Other | Attending: Physician Assistant | Admitting: Physician Assistant

## 2022-04-17 VITALS — BP 109/73 | HR 73 | Resp 16 | Ht 68.0 in | Wt 192.2 lb

## 2022-04-17 DIAGNOSIS — L409 Psoriasis, unspecified: Secondary | ICD-10-CM

## 2022-04-17 DIAGNOSIS — Z79899 Other long term (current) drug therapy: Secondary | ICD-10-CM | POA: Diagnosis not present

## 2022-04-17 DIAGNOSIS — G4733 Obstructive sleep apnea (adult) (pediatric): Secondary | ICD-10-CM

## 2022-04-17 DIAGNOSIS — M9903 Segmental and somatic dysfunction of lumbar region: Secondary | ICD-10-CM | POA: Diagnosis not present

## 2022-04-17 DIAGNOSIS — Z96651 Presence of right artificial knee joint: Secondary | ICD-10-CM | POA: Diagnosis not present

## 2022-04-17 DIAGNOSIS — M19042 Primary osteoarthritis, left hand: Secondary | ICD-10-CM

## 2022-04-17 DIAGNOSIS — G8929 Other chronic pain: Secondary | ICD-10-CM

## 2022-04-17 DIAGNOSIS — Z96652 Presence of left artificial knee joint: Secondary | ICD-10-CM | POA: Diagnosis not present

## 2022-04-17 DIAGNOSIS — M797 Fibromyalgia: Secondary | ICD-10-CM | POA: Diagnosis not present

## 2022-04-17 DIAGNOSIS — E559 Vitamin D deficiency, unspecified: Secondary | ICD-10-CM

## 2022-04-17 DIAGNOSIS — L608 Other nail disorders: Secondary | ICD-10-CM | POA: Diagnosis not present

## 2022-04-17 DIAGNOSIS — M19041 Primary osteoarthritis, right hand: Secondary | ICD-10-CM

## 2022-04-17 DIAGNOSIS — Z840 Family history of diseases of the skin and subcutaneous tissue: Secondary | ICD-10-CM

## 2022-04-17 DIAGNOSIS — M533 Sacrococcygeal disorders, not elsewhere classified: Secondary | ICD-10-CM

## 2022-04-17 DIAGNOSIS — L4050 Arthropathic psoriasis, unspecified: Secondary | ICD-10-CM

## 2022-04-17 DIAGNOSIS — M19071 Primary osteoarthritis, right ankle and foot: Secondary | ICD-10-CM

## 2022-04-17 DIAGNOSIS — M5416 Radiculopathy, lumbar region: Secondary | ICD-10-CM | POA: Diagnosis not present

## 2022-04-17 DIAGNOSIS — G478 Other sleep disorders: Secondary | ICD-10-CM

## 2022-04-17 DIAGNOSIS — R7303 Prediabetes: Secondary | ICD-10-CM

## 2022-04-17 DIAGNOSIS — M9904 Segmental and somatic dysfunction of sacral region: Secondary | ICD-10-CM | POA: Diagnosis not present

## 2022-04-17 DIAGNOSIS — Z111 Encounter for screening for respiratory tuberculosis: Secondary | ICD-10-CM

## 2022-04-17 DIAGNOSIS — Z9884 Bariatric surgery status: Secondary | ICD-10-CM

## 2022-04-17 DIAGNOSIS — M62830 Muscle spasm of back: Secondary | ICD-10-CM | POA: Diagnosis not present

## 2022-04-17 DIAGNOSIS — M19072 Primary osteoarthritis, left ankle and foot: Secondary | ICD-10-CM

## 2022-04-17 DIAGNOSIS — F411 Generalized anxiety disorder: Secondary | ICD-10-CM

## 2022-04-17 DIAGNOSIS — M8589 Other specified disorders of bone density and structure, multiple sites: Secondary | ICD-10-CM

## 2022-04-18 LAB — CBC WITH DIFFERENTIAL/PLATELET
Absolute Monocytes: 624 cells/uL (ref 200–950)
Basophils Absolute: 39 cells/uL (ref 0–200)
Basophils Relative: 0.6 %
Eosinophils Absolute: 52 cells/uL (ref 15–500)
Eosinophils Relative: 0.8 %
HCT: 38.5 % (ref 35.0–45.0)
Hemoglobin: 13.1 g/dL (ref 11.7–15.5)
Lymphs Abs: 1911 cells/uL (ref 850–3900)
MCH: 31.6 pg (ref 27.0–33.0)
MCHC: 34 g/dL (ref 32.0–36.0)
MCV: 93 fL (ref 80.0–100.0)
MPV: 11.2 fL (ref 7.5–12.5)
Monocytes Relative: 9.6 %
Neutro Abs: 3874 cells/uL (ref 1500–7800)
Neutrophils Relative %: 59.6 %
Platelets: 214 10*3/uL (ref 140–400)
RBC: 4.14 10*6/uL (ref 3.80–5.10)
RDW: 11.8 % (ref 11.0–15.0)
Total Lymphocyte: 29.4 %
WBC: 6.5 10*3/uL (ref 3.8–10.8)

## 2022-04-18 LAB — COMPLETE METABOLIC PANEL WITH GFR
AG Ratio: 2 (calc) (ref 1.0–2.5)
ALT: 24 U/L (ref 6–29)
AST: 21 U/L (ref 10–35)
Albumin: 4.3 g/dL (ref 3.6–5.1)
Alkaline phosphatase (APISO): 69 U/L (ref 37–153)
BUN: 16 mg/dL (ref 7–25)
CO2: 28 mmol/L (ref 20–32)
Calcium: 10 mg/dL (ref 8.6–10.4)
Chloride: 104 mmol/L (ref 98–110)
Creat: 0.72 mg/dL (ref 0.50–1.05)
Globulin: 2.1 g/dL (calc) (ref 1.9–3.7)
Glucose, Bld: 96 mg/dL (ref 65–99)
Potassium: 4.8 mmol/L (ref 3.5–5.3)
Sodium: 139 mmol/L (ref 135–146)
Total Bilirubin: 0.4 mg/dL (ref 0.2–1.2)
Total Protein: 6.4 g/dL (ref 6.1–8.1)
eGFR: 92 mL/min/{1.73_m2} (ref >=60)

## 2022-04-19 NOTE — Progress Notes (Signed)
CBC and CMP WNL.

## 2022-04-20 DIAGNOSIS — M5416 Radiculopathy, lumbar region: Secondary | ICD-10-CM | POA: Diagnosis not present

## 2022-04-20 DIAGNOSIS — M62830 Muscle spasm of back: Secondary | ICD-10-CM | POA: Diagnosis not present

## 2022-04-20 DIAGNOSIS — M9904 Segmental and somatic dysfunction of sacral region: Secondary | ICD-10-CM | POA: Diagnosis not present

## 2022-04-20 DIAGNOSIS — M9903 Segmental and somatic dysfunction of lumbar region: Secondary | ICD-10-CM | POA: Diagnosis not present

## 2022-04-22 DIAGNOSIS — M62830 Muscle spasm of back: Secondary | ICD-10-CM | POA: Diagnosis not present

## 2022-04-22 DIAGNOSIS — M9904 Segmental and somatic dysfunction of sacral region: Secondary | ICD-10-CM | POA: Diagnosis not present

## 2022-04-22 DIAGNOSIS — M5416 Radiculopathy, lumbar region: Secondary | ICD-10-CM | POA: Diagnosis not present

## 2022-04-22 DIAGNOSIS — M9903 Segmental and somatic dysfunction of lumbar region: Secondary | ICD-10-CM | POA: Diagnosis not present

## 2022-04-24 DIAGNOSIS — M9904 Segmental and somatic dysfunction of sacral region: Secondary | ICD-10-CM | POA: Diagnosis not present

## 2022-04-24 DIAGNOSIS — M5416 Radiculopathy, lumbar region: Secondary | ICD-10-CM | POA: Diagnosis not present

## 2022-04-24 DIAGNOSIS — M62830 Muscle spasm of back: Secondary | ICD-10-CM | POA: Diagnosis not present

## 2022-04-24 DIAGNOSIS — M9903 Segmental and somatic dysfunction of lumbar region: Secondary | ICD-10-CM | POA: Diagnosis not present

## 2022-04-29 DIAGNOSIS — M62830 Muscle spasm of back: Secondary | ICD-10-CM | POA: Diagnosis not present

## 2022-04-29 DIAGNOSIS — M9904 Segmental and somatic dysfunction of sacral region: Secondary | ICD-10-CM | POA: Diagnosis not present

## 2022-04-29 DIAGNOSIS — M5416 Radiculopathy, lumbar region: Secondary | ICD-10-CM | POA: Diagnosis not present

## 2022-04-29 DIAGNOSIS — M9903 Segmental and somatic dysfunction of lumbar region: Secondary | ICD-10-CM | POA: Diagnosis not present

## 2022-05-01 ENCOUNTER — Encounter: Payer: Self-pay | Admitting: Family Medicine

## 2022-05-04 DIAGNOSIS — M62830 Muscle spasm of back: Secondary | ICD-10-CM | POA: Diagnosis not present

## 2022-05-04 DIAGNOSIS — M5416 Radiculopathy, lumbar region: Secondary | ICD-10-CM | POA: Diagnosis not present

## 2022-05-04 DIAGNOSIS — M9904 Segmental and somatic dysfunction of sacral region: Secondary | ICD-10-CM | POA: Diagnosis not present

## 2022-05-04 DIAGNOSIS — M9903 Segmental and somatic dysfunction of lumbar region: Secondary | ICD-10-CM | POA: Diagnosis not present

## 2022-05-05 MED ORDER — TRAZODONE HCL 50 MG PO TABS: 25.0000 mg | ORAL_TABLET | Freq: Every evening | ORAL | 3 refills | Status: DC | PRN

## 2022-05-05 NOTE — Telephone Encounter (Signed)
Patient called in and wanted to be sure that Dr. Diona Browner knew to send the medication to [place], [place].

## 2022-05-05 NOTE — Telephone Encounter (Signed)
Prescription has been sent in  

## 2022-05-08 ENCOUNTER — Other Ambulatory Visit: Payer: Self-pay | Admitting: Family Medicine

## 2022-05-08 NOTE — Telephone Encounter (Signed)
Last office visit 08/19/21 for [place].  Last refilled 02/24/22 for #15 with no refills.  Last Vit D level 09/19/21 which was normal at 46.25 ng/mL.  Next Appt: No future appointments with PCP.

## 2022-05-10 ENCOUNTER — Other Ambulatory Visit: Payer: Self-pay | Admitting: Family Medicine

## 2022-05-10 NOTE — Telephone Encounter (Signed)
Last office visit 08/20/21 for [place].  Last refilled 04/14/22 for #180 with no refills.  No future appointment with PCP.

## 2022-05-13 DIAGNOSIS — M9904 Segmental and somatic dysfunction of sacral region: Secondary | ICD-10-CM | POA: Diagnosis not present

## 2022-05-13 DIAGNOSIS — M5416 Radiculopathy, lumbar region: Secondary | ICD-10-CM | POA: Diagnosis not present

## 2022-05-13 DIAGNOSIS — M9903 Segmental and somatic dysfunction of lumbar region: Secondary | ICD-10-CM | POA: Diagnosis not present

## 2022-05-13 DIAGNOSIS — M62830 Muscle spasm of back: Secondary | ICD-10-CM | POA: Diagnosis not present

## 2022-05-18 DIAGNOSIS — M62830 Muscle spasm of back: Secondary | ICD-10-CM | POA: Diagnosis not present

## 2022-05-18 DIAGNOSIS — M9904 Segmental and somatic dysfunction of sacral region: Secondary | ICD-10-CM | POA: Diagnosis not present

## 2022-05-18 DIAGNOSIS — M9903 Segmental and somatic dysfunction of lumbar region: Secondary | ICD-10-CM | POA: Diagnosis not present

## 2022-05-18 DIAGNOSIS — M5416 Radiculopathy, lumbar region: Secondary | ICD-10-CM | POA: Diagnosis not present

## 2022-05-20 DIAGNOSIS — M9904 Segmental and somatic dysfunction of sacral region: Secondary | ICD-10-CM | POA: Diagnosis not present

## 2022-05-20 DIAGNOSIS — M9903 Segmental and somatic dysfunction of lumbar region: Secondary | ICD-10-CM | POA: Diagnosis not present

## 2022-05-20 DIAGNOSIS — M5416 Radiculopathy, lumbar region: Secondary | ICD-10-CM | POA: Diagnosis not present

## 2022-05-20 DIAGNOSIS — M62830 Muscle spasm of back: Secondary | ICD-10-CM | POA: Diagnosis not present

## 2022-05-22 ENCOUNTER — Other Ambulatory Visit: Payer: Self-pay | Admitting: Family Medicine

## 2022-05-22 NOTE — Telephone Encounter (Signed)
Patient scheduled.

## 2022-05-22 NOTE — Telephone Encounter (Signed)
Please call and schedule Medicare Wellness with nurse and CPE with fasting labs prior with Dr. Diona Browner for sometime after 08/20/22.

## 2022-05-26 DIAGNOSIS — M9904 Segmental and somatic dysfunction of sacral region: Secondary | ICD-10-CM | POA: Diagnosis not present

## 2022-05-26 DIAGNOSIS — M5416 Radiculopathy, lumbar region: Secondary | ICD-10-CM | POA: Diagnosis not present

## 2022-05-26 DIAGNOSIS — M9903 Segmental and somatic dysfunction of lumbar region: Secondary | ICD-10-CM | POA: Diagnosis not present

## 2022-05-26 DIAGNOSIS — M62830 Muscle spasm of back: Secondary | ICD-10-CM | POA: Diagnosis not present

## 2022-06-02 DIAGNOSIS — M62830 Muscle spasm of back: Secondary | ICD-10-CM | POA: Diagnosis not present

## 2022-06-02 DIAGNOSIS — M9903 Segmental and somatic dysfunction of lumbar region: Secondary | ICD-10-CM | POA: Diagnosis not present

## 2022-06-02 DIAGNOSIS — M5416 Radiculopathy, lumbar region: Secondary | ICD-10-CM | POA: Diagnosis not present

## 2022-06-02 DIAGNOSIS — M9904 Segmental and somatic dysfunction of sacral region: Secondary | ICD-10-CM | POA: Diagnosis not present

## 2022-06-03 ENCOUNTER — Ambulatory Visit: Payer: Medicare Other | Admitting: Skilled Nursing Facility1

## 2022-06-07 ENCOUNTER — Other Ambulatory Visit: Payer: Self-pay | Admitting: Family Medicine

## 2022-06-07 NOTE — Telephone Encounter (Signed)
Last office visit 08/19/21 for Jillian [name].  Last refilled 05/12/22 for #180 with no refills.  Next Appt: CPE 08/25/22.

## 2022-06-09 DIAGNOSIS — M5416 Radiculopathy, lumbar region: Secondary | ICD-10-CM | POA: Diagnosis not present

## 2022-06-09 DIAGNOSIS — M9904 Segmental and somatic dysfunction of sacral region: Secondary | ICD-10-CM | POA: Diagnosis not present

## 2022-06-09 DIAGNOSIS — M9903 Segmental and somatic dysfunction of lumbar region: Secondary | ICD-10-CM | POA: Diagnosis not present

## 2022-06-09 DIAGNOSIS — M62830 Muscle spasm of back: Secondary | ICD-10-CM | POA: Diagnosis not present

## 2022-06-15 ENCOUNTER — Other Ambulatory Visit: Payer: Self-pay | Admitting: Rheumatology

## 2022-06-15 DIAGNOSIS — L409 Psoriasis, unspecified: Secondary | ICD-10-CM

## 2022-06-15 DIAGNOSIS — Z79899 Other long term (current) drug therapy: Secondary | ICD-10-CM

## 2022-06-15 DIAGNOSIS — L4050 Arthropathic psoriasis, unspecified: Secondary | ICD-10-CM

## 2022-06-15 NOTE — Telephone Encounter (Signed)
Next Visit: 07/13/2022  Last Visit: 04/17/2022  Last Fill: 12/22/2021  DX: Psoriatic arthritis    Current Dose per office note 04/17/2022: Taltz 80 mg sq injections every 4 weeks.   Labs: 04/17/2022 CBC and CMP WNL   TB Gold: 08/14/2022 Neg    Okay to refill Taltz?

## 2022-06-16 DIAGNOSIS — M5416 Radiculopathy, lumbar region: Secondary | ICD-10-CM | POA: Diagnosis not present

## 2022-06-16 DIAGNOSIS — M62830 Muscle spasm of back: Secondary | ICD-10-CM | POA: Diagnosis not present

## 2022-06-16 DIAGNOSIS — M9904 Segmental and somatic dysfunction of sacral region: Secondary | ICD-10-CM | POA: Diagnosis not present

## 2022-06-16 DIAGNOSIS — M9903 Segmental and somatic dysfunction of lumbar region: Secondary | ICD-10-CM | POA: Diagnosis not present

## 2022-06-17 ENCOUNTER — Other Ambulatory Visit: Payer: Self-pay | Admitting: Rheumatology

## 2022-06-17 DIAGNOSIS — L409 Psoriasis, unspecified: Secondary | ICD-10-CM

## 2022-06-17 DIAGNOSIS — L4050 Arthropathic psoriasis, unspecified: Secondary | ICD-10-CM

## 2022-06-17 DIAGNOSIS — Z79899 Other long term (current) drug therapy: Secondary | ICD-10-CM

## 2022-06-18 ENCOUNTER — Other Ambulatory Visit: Payer: Self-pay | Admitting: Family Medicine

## 2022-06-18 DIAGNOSIS — Z1231 Encounter for screening mammogram for malignant neoplasm of breast: Secondary | ICD-10-CM

## 2022-06-19 ENCOUNTER — Other Ambulatory Visit: Payer: Self-pay | Admitting: Rheumatology

## 2022-06-19 DIAGNOSIS — Z79899 Other long term (current) drug therapy: Secondary | ICD-10-CM

## 2022-06-19 DIAGNOSIS — L4050 Arthropathic psoriasis, unspecified: Secondary | ICD-10-CM

## 2022-06-19 DIAGNOSIS — L409 Psoriasis, unspecified: Secondary | ICD-10-CM

## 2022-06-21 ENCOUNTER — Other Ambulatory Visit: Payer: Self-pay | Admitting: Rheumatology

## 2022-06-21 DIAGNOSIS — L4050 Arthropathic psoriasis, unspecified: Secondary | ICD-10-CM

## 2022-06-21 DIAGNOSIS — Z79899 Other long term (current) drug therapy: Secondary | ICD-10-CM

## 2022-06-21 DIAGNOSIS — L409 Psoriasis, unspecified: Secondary | ICD-10-CM

## 2022-06-29 NOTE — Progress Notes (Signed)
Office Visit Note  Patient: Jillian [name]             Date of Birth: 05-04-1955           MRN: [id number]             PCP: Excell Seltzer, MD Referring: Excell Seltzer, MD Visit Date: 07/13/2022 Occupation: @GUAROCC @  Subjective:  Medication management  History of Present Illness: Jillian [name] is a 67 y.o. adult history of psoriatic arthritis, psoriasis, osteoarthritis, fibromyalgia syndrome.  She states she has been taking 80 mg subcutaneous every weeks without any interruption.  She states the shipment was delayed 1 time but she is back on the schedule.  She continues to have some stiffness in her bilateral hands and discomfort in her SI joints.  Not having any nocturnal pain.  She continues to have psoriasis patches on her scalp, her face, axilla region and also in perineal region.  She uses Diprolene ointment prescribed by her dermatologist.  She states that she started having sciatica symptoms on the left side for which she has been going to a chiropractor for the last 1 year now.  Which has been helpful.  Activities of Daily Living:  Patient reports morning stiffness for 4 hours.   Patient Denies nocturnal pain.  Difficulty dressing/grooming: Denies Difficulty climbing stairs: Denies Difficulty getting out of chair: Denies Difficulty using hands for taps, buttons, cutlery, and/or writing: Denies  Review of Systems  Constitutional:  Negative for fatigue.  HENT:  Negative for mouth sores and mouth dryness.   Eyes:  Positive for dryness.  Respiratory:  Negative for shortness of breath.   Cardiovascular:  Negative for chest pain and palpitations.  Gastrointestinal:  Positive for constipation. Negative for blood in stool and diarrhea.  Endocrine: Negative for increased urination.  Genitourinary:  Negative for involuntary urination.  Musculoskeletal:  Positive for joint pain, gait problem, joint pain, myalgias, muscle weakness, morning stiffness, muscle tenderness and myalgias.  Negative for joint swelling.  Skin:  Positive for rash. Negative for color change, hair loss and sensitivity to sunlight.  Allergic/Immunologic: Negative for susceptible to infections.  Neurological:  Negative for dizziness and headaches.  Hematological:  Negative for swollen glands.  Psychiatric/Behavioral:  Negative for depressed mood and sleep disturbance. The patient is not nervous/anxious.     PMFS History:  Patient Active Problem List   Diagnosis Date Noted   Psoriatic arthritis 05/07/2021   Psoriasis 05/07/2021   Primary osteoarthritis of both hands 05/07/2021   Primary osteoarthritis of both feet 05/07/2021   Encounter for weight management 02/14/2021   Pitting of nails 01/23/2020   OSA (obstructive sleep apnea) 04/12/2019   S/P left unicompartmental knee replacement 03/21/2019   Osteoarthritis of left knee 03/09/2019   Primary localized osteoarthritis of right knee 02/07/2019   Status post right partial knee replacement 02/07/2019   Class 1 obesity due to excess calories with serious comorbidity and body mass index (BMI) of 31.0 to 31.9 in adult 05/13/2018   Prediabetes 12/03/2017   Non-restorative sleep 02/27/2016   Vitamin D deficiency 10/28/2015   Generalized anxiety disorder 11/25/2012   HYPERTRIGLYCERIDEMIA 12/20/2008   MENOPAUSE, EARLY 10/04/2008   Fibromyalgia 10/04/2008    Past Medical History:  Diagnosis Date   Anxiety    Cancer    basal cell 2016   Dermatitis    per patient    Fibromyalgia    Prediabetes 12/03/2017   Primary localized osteoarthritis of right knee 02/07/2019   Sleep  apnea    uses CPAP   UTI (urinary tract infection)     Family History  Problem Relation Age of Onset   Cancer Father        colon   Hyperlipidemia Father    Hypertension Father    Diabetes Sister    Cancer Paternal Aunt        colon   Cancer Maternal Grandmother        colon   Hypertension Maternal Grandmother    Anuerysm Paternal Grandfather    Psoriasis Brother     Arthritis Brother    Psoriasis Sister    Osteoarthritis Brother    Diabetes Brother    Throat cancer Brother    Healthy Son    Healthy Daughter    Past Surgical History:  Procedure Laterality Date   BIOPSY  02/05/2021   Procedure: BIOPSY;  Surgeon: Stechschulte, Hyman Hopes, MD;  Location: [facility];  Service: General;;   CESAREAN SECTION     times 2   ESOPHAGOGASTRODUODENOSCOPY N/A 02/05/2021   Procedure: ESOPHAGOGASTRODUODENOSCOPY (EGD);  Surgeon: Quentin Ore, MD;  Location: [facility];  Service: General;  Laterality: N/A;   EYE SURGERY Bilateral 04/24/2020   bilateral eyelids    GASTRIC BYPASS  06/03/2021   HERNIA REPAIR     PARTIAL KNEE ARTHROPLASTY Right 02/07/2019   Procedure: UNICOMPARTMENTAL KNEE;  Surgeon: Teryl Lucy, MD;  Location: [facility];  Service: Orthopedics;  Laterality: Right;   PARTIAL KNEE ARTHROPLASTY Left 03/21/2019   Procedure: UNICOMPARTMENTAL KNEE;  Surgeon: Teryl Lucy, MD;  Location: [facility];  Service: Orthopedics;  Laterality: Left;   UPPER GI ENDOSCOPY  03/2021   UPPER GI ENDOSCOPY N/A 06/03/2021   Procedure: UPPER GI ENDOSCOPY;  Surgeon: Quentin Ore, MD;  Location: [facility];  Service: General;  Laterality: N/A;   Social History   Social History Narrative   Regular exercise-yes, but seasonal walks and swims   Diet: healthy, low carbohydrates, no caffeine   Immunization History  Administered Date(s) Administered   Influenza-Unspecified 01/01/2020, 12/28/2020   Moderna Sars-Covid-2 Vaccination 05/23/2019, 07/04/2019   PNEUMOCOCCAL CONJUGATE-20 08/16/2020   Td 03/30/2004   Tdap 10/28/2015   Zoster, Live 08/30/2014     Objective: Vital Signs: BP 112/71 (BP Location: Left Arm, Patient Position: Sitting, Cuff Size: Normal)   Pulse 76   Resp 12   Ht  (1.727 m)   Wt 183 lb (83 kg)   BMI 27.83 kg/m    Physical Exam Vitals and nursing note reviewed.  Constitutional:      Appearance: She is well-developed.  HENT:      Head: Normocephalic and atraumatic.  Eyes:     Conjunctiva/sclera: Conjunctivae normal.     Pupils: Pupils are equal, round, and reactive to light.  Cardiovascular:     Rate and Rhythm: Normal rate and regular rhythm.     Heart sounds: Normal heart sounds.  Pulmonary:     Effort: Pulmonary effort is normal.     Breath sounds: Normal breath sounds.  Abdominal:     General: Bowel sounds are normal.     Palpations: Abdomen is soft.  Musculoskeletal:     Cervical back: Normal range of motion and neck supple.  Skin:    General: Skin is warm and dry.     Capillary Refill: Capillary refill takes less than 2 seconds.  Neurological:     Mental Status: She is alert and oriented to person, place, and time.  Psychiatric:  Behavior: Behavior normal.      Musculoskeletal Exam: Cervical spine was in good range of motion.  She has some discomfort range of motion of the lumbar spine.  She had tenderness over right SI joint.  Shoulder joints, elbow joints, wrist joints, MCPs PIPs and DIPs were in good range of motion.  She had bilateral PIP and DIP thickening with no synovitis.  No nail pitting was noted.  Hip joints and knee joints were in good range of motion.  She had no tenderness over ankles or MTPs.  There was no Achilles tendinitis of Planter fasciitis.  CDAI Exam: CDAI Score: -- Patient Global: --; Provider Global: -- Swollen: --; Tender: -- Joint Exam 07/13/2022   No joint exam has been documented for this visit   There is currently no information documented on the homunculus. Go to the Rheumatology activity and complete the homunculus joint exam.  Investigation: No additional findings.  Imaging: No results found.  Recent Labs: Lab Results  Component Value Date   WBC 6.5 04/17/2022   HGB 13.1 04/17/2022   PLT 214 04/17/2022   NA 139 04/17/2022   K 4.8 04/17/2022   CL 104 04/17/2022   CO2 28 04/17/2022   GLUCOSE 96 04/17/2022   BUN 16 04/17/2022   CREATININE  0.72 04/17/2022   BILITOT 0.4 04/17/2022   ALKPHOS 76 09/19/2021   AST 21 04/17/2022   ALT 24 04/17/2022   PROT 6.4 04/17/2022   ALBUMIN 4.3 09/19/2021   CALCIUM 10.0 04/17/2022   GFRAA 83 07/16/2020   QFTBGOLDPLUS NEGATIVE 08/13/2021    Speciality Comments: tremfya -started September 16, 2020; Taltz started 07/02/21  Procedures:  No procedures performed Allergies: Patient has no known allergies.   Assessment / Plan:     Visit Diagnoses: Psoriatic arthritis - inflammatory arthritis, psoriasis, plantar fasciitis, fingernail pitting, RF-, HLA-B27-, chronic SI joint pain, family hx of psoriasis and psoriatic arthritis.  She has been doing well on Taltz 80 mg subcu every 4 weeks.  Patient states she had slight delay in the treatment because of the shipment.  Otherwise she has been taking her medications on a regular basis.  She did not have a psoriatic arthritis flare.  She had no synovitis on the examination today.  Patient denies any history of Achilles tendinitis, Planter fasciitis or uveitis.  Psoriasis - Diagnosed by Dr. Harlon Flor.  Palmar psoriasis has cleared since initiating Taltz.  No nail pitting was noted today.  She states she has intermittent psoriasis in her axillary and pubic region.  She uses topical agents prescribed by her dermatologist.  High risk medication use - Taltz 80 mg sq injections every 4 weeks. Patient initiated Altamease Oiler on 07/02/2021.Previous therapy includes: Tremfya.  Labs obtained on April 17, 2022 CBC and CMP were normal.  TB Gold was negative on Aug 13, 2021.  She will get labs today which will include CBC and CMP.  She was advised to get labs every 3 months.  TB Gold will be done annually after today.  Information on immunization was placed in the AVS.  She was advised to hold Taltz if she develops an infection and resume after the infection resolves.  Pitting of nails-resolved.  Primary osteoarthritis of both hands-she had bilateral PIP and DIP thickening.  Joint  protection muscle strengthening was discussed.  Status post right partial knee replacement - Dr. Rogelio Seen on 02/07/2019.  She had no discomfort and had good range of motion.  S/P left unicompartmental knee replacement - - Dr. Rogelio Seen  on 03/21/2019.  Patient denies pain.  Primary osteoarthritis of both feet-she has intermittent disc protrusion feet.  Joint protection muscle strengthening was discussed.  Chronic SI joint pain-she continues to have Phoniq SI joint discomfort.  She had right SI joint tenderness.  Patient declined cortisone injection.  She will contact us when she is ready.  Family history of psoriatic arthritis - Brother and sister  Fibromyalgia -she continues to have some generalized pain and discomfort from fibromyalgia.  Lyrica 150 mg twice daily, methocarbamol 500 mg 3 times daily as needed for muscle spasms, Cymbalta 30 mg,3 capsules by mouth daily, tramadol prescribed by PCP.  Osteopenia of multiple sites - DEXA scan on January 29, 2021 DEXA scan showed T score of -1.1 in the left femoral neck.  Due to update DEXA November 2024.  Calcium rich diet and vitamin D was discussed.  Other medical problems are listed as follows:  Non-restorative sleep  Prediabetes  OSA (obstructive sleep apnea)  Generalized anxiety disorder  Vitamin D deficiency  Status post gastric bypass for obesity  Orders: Orders Placed This Encounter  Procedures   CBC with Differential/Platelet   COMPLETE METABOLIC PANEL WITH GFR   QuantiFERON-TB Gold Plus   No orders of the defined types were placed in this encounter.    Follow-Up Instructions: Return in about 5 months (around 12/13/2022) for Psoriatic arthritis.   Pollyann Savoy, MD  Note - This record has been created using Animal nutritionist.  Chart creation errors have been sought, but may not always  have been located. Such creation errors do not reflect on  the standard of medical care.

## 2022-06-30 DIAGNOSIS — M5416 Radiculopathy, lumbar region: Secondary | ICD-10-CM | POA: Diagnosis not present

## 2022-06-30 DIAGNOSIS — M9903 Segmental and somatic dysfunction of lumbar region: Secondary | ICD-10-CM | POA: Diagnosis not present

## 2022-06-30 DIAGNOSIS — M62830 Muscle spasm of back: Secondary | ICD-10-CM | POA: Diagnosis not present

## 2022-06-30 DIAGNOSIS — M9904 Segmental and somatic dysfunction of sacral region: Secondary | ICD-10-CM | POA: Diagnosis not present

## 2022-07-07 ENCOUNTER — Encounter: Payer: Self-pay | Admitting: Family Medicine

## 2022-07-07 MED ORDER — PREGABALIN 150 MG PO CAPS: 150.0000 mg | ORAL_CAPSULE | Freq: Two times a day (BID) | ORAL | 0 refills | Status: DC

## 2022-07-07 MED ORDER — TRAMADOL HCL 50 MG PO TABS: 100.0000 mg | ORAL_TABLET | Freq: Three times a day (TID) | ORAL | 0 refills | Status: DC

## 2022-07-07 MED ORDER — BUPROPION HCL ER (XL) 150 MG PO TB24: 150.0000 mg | ORAL_TABLET | Freq: Every day | ORAL | 0 refills | Status: DC

## 2022-07-07 MED ORDER — TRAZODONE HCL 50 MG PO TABS: 25.0000 mg | ORAL_TABLET | Freq: Every evening | ORAL | 0 refills | Status: DC | PRN

## 2022-07-07 MED ORDER — DULOXETINE HCL 30 MG PO CPEP: 90.0000 mg | ORAL_CAPSULE | Freq: Every day | ORAL | 0 refills | Status: DC

## 2022-07-07 NOTE — Telephone Encounter (Signed)
Last office visit 08/19/2021 for MWV.  Last refilled 06/09/2022 for #180 with no refills.  Next Appt: CPE 08/25/2022.

## 2022-07-07 NOTE — Telephone Encounter (Signed)
Last office visit 08/19/2021 for MWV.  Last refilled 04/14/2022 for #180 with no refills.  Next Appt: CPE 08/25/2022.

## 2022-07-07 NOTE — Addendum Note (Signed)
Addended by: Damita Lack on: 07/07/2022 02:01 PM   Modules accepted: Orders

## 2022-07-13 ENCOUNTER — Encounter: Payer: Self-pay | Admitting: Rheumatology

## 2022-07-13 ENCOUNTER — Ambulatory Visit: Payer: Medicare HMO | Attending: Rheumatology | Admitting: Rheumatology

## 2022-07-13 VITALS — BP 112/71 | HR 76 | Resp 12 | Ht 68.0 in | Wt 183.0 lb

## 2022-07-13 DIAGNOSIS — M19041 Primary osteoarthritis, right hand: Secondary | ICD-10-CM

## 2022-07-13 DIAGNOSIS — M797 Fibromyalgia: Secondary | ICD-10-CM | POA: Diagnosis not present

## 2022-07-13 DIAGNOSIS — M533 Sacrococcygeal disorders, not elsewhere classified: Secondary | ICD-10-CM | POA: Diagnosis not present

## 2022-07-13 DIAGNOSIS — L608 Other nail disorders: Secondary | ICD-10-CM | POA: Diagnosis not present

## 2022-07-13 DIAGNOSIS — L4050 Arthropathic psoriasis, unspecified: Secondary | ICD-10-CM | POA: Diagnosis not present

## 2022-07-13 DIAGNOSIS — Z79899 Other long term (current) drug therapy: Secondary | ICD-10-CM | POA: Diagnosis not present

## 2022-07-13 DIAGNOSIS — Z96651 Presence of right artificial knee joint: Secondary | ICD-10-CM

## 2022-07-13 DIAGNOSIS — G4733 Obstructive sleep apnea (adult) (pediatric): Secondary | ICD-10-CM

## 2022-07-13 DIAGNOSIS — Z840 Family history of diseases of the skin and subcutaneous tissue: Secondary | ICD-10-CM | POA: Diagnosis not present

## 2022-07-13 DIAGNOSIS — R7303 Prediabetes: Secondary | ICD-10-CM

## 2022-07-13 DIAGNOSIS — M19042 Primary osteoarthritis, left hand: Secondary | ICD-10-CM

## 2022-07-13 DIAGNOSIS — E559 Vitamin D deficiency, unspecified: Secondary | ICD-10-CM

## 2022-07-13 DIAGNOSIS — M19072 Primary osteoarthritis, left ankle and foot: Secondary | ICD-10-CM

## 2022-07-13 DIAGNOSIS — M8589 Other specified disorders of bone density and structure, multiple sites: Secondary | ICD-10-CM

## 2022-07-13 DIAGNOSIS — Z96652 Presence of left artificial knee joint: Secondary | ICD-10-CM | POA: Diagnosis not present

## 2022-07-13 DIAGNOSIS — L409 Psoriasis, unspecified: Secondary | ICD-10-CM | POA: Diagnosis not present

## 2022-07-13 DIAGNOSIS — F411 Generalized anxiety disorder: Secondary | ICD-10-CM

## 2022-07-13 DIAGNOSIS — M19071 Primary osteoarthritis, right ankle and foot: Secondary | ICD-10-CM | POA: Diagnosis not present

## 2022-07-13 DIAGNOSIS — G478 Other sleep disorders: Secondary | ICD-10-CM

## 2022-07-13 DIAGNOSIS — Z9884 Bariatric surgery status: Secondary | ICD-10-CM

## 2022-07-13 DIAGNOSIS — G8929 Other chronic pain: Secondary | ICD-10-CM

## 2022-07-13 NOTE — Patient Instructions (Signed)
Standing Labs We placed an order today for your standing lab work.   Please have your standing labs drawn in July and every 3 months  Please have your labs drawn 2 weeks prior to your appointment so that the provider can discuss your lab results at your appointment, if possible.  Please note that you may see your imaging and lab results in MyChart before we have reviewed them. We will contact you once all results are reviewed. Please allow our office up to 72 hours to thoroughly review all of the results before contacting the office for clarification of your results.  WALK-IN LAB HOURS  Monday through Thursday from 8:00 am -12:30 pm and 1:00 pm-5:00 pm and Friday from 8:00 am-12:00 pm.  Patients with office visits requiring labs will be seen before walk-in labs.  You may encounter longer than normal wait times. Please allow additional time. Wait times may be shorter on  Monday and Thursday afternoons.  We do not book appointments for walk-in labs. We appreciate your patience and understanding with our staff.   Labs are drawn by [facility]. Please bring your co-pay at the time of your lab draw.  You may receive a bill from [facility] for your lab work.  Please note if you are on Hydroxychloroquine and and an order has been placed for a Hydroxychloroquine level,  you will need to have it drawn 4 hours or more after your last dose.  If you wish to have your labs drawn at another location, please call the office 24 hours in advance so we can fax the orders.  The office is located at [address], [place], [place] [address]   If you have any questions regarding directions or hours of operation,  please call [phone].   As a reminder, please drink plenty of water prior to coming for your lab work. Thanks!   Vaccines You are taking a medication(s) that can suppress your immune system.  The following immunizations are recommended: Flu annually Covid-19  Td/Tdap (tetanus, diphtheria,  pertussis) every 10 years Pneumonia (Prevnar 15 then Pneumovax 23 at least 1 year apart.  Alternatively, can take Prevnar 20 without needing additional dose) Shingrix: 2 doses from 4 weeks to 6 months apart  Please check with your PCP to make sure you are up to date.   If you have signs or symptoms of an infection or start antibiotics: First, call your PCP for workup of your infection. Hold your medication through the infection, until you complete your antibiotics, and until symptoms resolve if you take the following: Injectable medication (Actemra, Benlysta, Cimzia, Cosentyx, Enbrel, Humira, Kevzara, Orencia, Remicade, Simponi, Stelara, Taltz, Tremfya) Methotrexate Leflunomide (Arava) Mycophenolate (Cellcept) Harriette Ohara, Olumiant, or Rinvoq

## 2022-07-14 LAB — COMPLETE METABOLIC PANEL WITH GFR
ALT: 20 U/L (ref 6–29)
Alkaline phosphatase (APISO): 69 U/L (ref 37–153)
Chloride: 105 mmol/L (ref 98–110)

## 2022-07-14 NOTE — Progress Notes (Signed)
CBC and CMP are normal.

## 2022-07-15 DIAGNOSIS — G4733 Obstructive sleep apnea (adult) (pediatric): Secondary | ICD-10-CM | POA: Diagnosis not present

## 2022-07-15 LAB — CBC WITH DIFFERENTIAL/PLATELET
Absolute Monocytes: 616 cells/uL (ref 200–950)
Basophils Absolute: 40 cells/uL (ref 0–200)
Basophils Relative: 0.7 %
Eosinophils Absolute: 143 cells/uL (ref 15–500)
Eosinophils Relative: 2.5 %
HCT: 39 % (ref 35.0–45.0)
Hemoglobin: 12.9 g/dL (ref 11.7–15.5)
Lymphs Abs: 1414 cells/uL (ref 850–3900)
MCH: 30.7 pg (ref 27.0–33.0)
MCHC: 33.1 g/dL (ref 32.0–36.0)
MCV: 92.9 fL (ref 80.0–100.0)
MPV: 11.3 fL (ref 7.5–12.5)
Monocytes Relative: 10.8 %
Neutro Abs: 3488 cells/uL (ref 1500–7800)
Neutrophils Relative %: 61.2 %
Platelets: 219 10*3/uL (ref 140–400)
RBC: 4.2 10*6/uL (ref 3.80–5.10)
RDW: 12.1 % (ref 11.0–15.0)
Total Lymphocyte: 24.8 %
WBC: 5.7 10*3/uL (ref 3.8–10.8)

## 2022-07-15 LAB — COMPLETE METABOLIC PANEL WITH GFR
AG Ratio: 2 (calc) (ref 1.0–2.5)
AST: 18 U/L (ref 10–35)
Albumin: 4.3 g/dL (ref 3.6–5.1)
BUN: 21 mg/dL (ref 7–25)
CO2: 29 mmol/L (ref 20–32)
Calcium: 9.7 mg/dL (ref 8.6–10.4)
Creat: 0.76 mg/dL (ref 0.50–1.05)
Globulin: 2.1 g/dL (calc) (ref 1.9–3.7)
Glucose, Bld: 92 mg/dL (ref 65–139)
Potassium: 4.6 mmol/L (ref 3.5–5.3)
Sodium: 141 mmol/L (ref 135–146)
Total Bilirubin: 0.4 mg/dL (ref 0.2–1.2)
Total Protein: 6.4 g/dL (ref 6.1–8.1)
eGFR: 86 mL/min/{1.73_m2} (ref >=60)

## 2022-07-15 LAB — QUANTIFERON-TB GOLD PLUS
Mitogen-NIL: 9.17 IU/mL
NIL: 0.02 IU/mL
QuantiFERON-TB Gold Plus: NEGATIVE
TB1-NIL: 0 IU/mL
TB2-NIL: 0 IU/mL

## 2022-07-15 NOTE — Progress Notes (Signed)
TB Gold is negative.

## 2022-07-22 ENCOUNTER — Ambulatory Visit (INDEPENDENT_AMBULATORY_CARE_PROVIDER_SITE_OTHER): Payer: Medicare HMO | Admitting: Pulmonary Disease

## 2022-07-22 ENCOUNTER — Encounter: Payer: Self-pay | Admitting: Pulmonary Disease

## 2022-07-22 ENCOUNTER — Telehealth: Payer: Medicare HMO | Admitting: Pulmonary Disease

## 2022-07-22 VITALS — BP 112/78 | HR 83 | Temp 98.1°F | Ht 68.0 in | Wt 188.6 lb

## 2022-07-22 DIAGNOSIS — G4733 Obstructive sleep apnea (adult) (pediatric): Secondary | ICD-10-CM

## 2022-07-22 NOTE — Patient Instructions (Signed)
Will have [facility] arrange for a new CPAP machine and refit your CPAP mask.  You can look up CPAP mask options at CPAP.com or similar web site.  Follow up in 4 months, and this can be scheduled as a video visit.

## 2022-07-22 NOTE — Progress Notes (Signed)
[place] [place] [facility], [facility] Sleep Medicine  Chief Complaint  Patient presents with   Follow-up    CPAP is good. She has lost weight and feels the mask does not seal good. Pressure is ok.     Constitutional:  BP 112/78 (BP Location: Left Arm, Cuff Size: Normal)   Pulse 83   Temp 98.1 F (36.7 C)   Ht  (1.727 m)   Wt 188 lb 9.6 oz (85.5 kg)   SpO2 95%   BMI 28.68 kg/m   Past Medical History:  Fibromyalgia, OA  Past Surgical History:  She  has a past surgical history that includes Hernia repair; Cesarean section; Partial knee arthroplasty (Right, 02/07/2019); Partial knee arthroplasty (Left, 03/21/2019); Eye surgery (Bilateral, 04/24/2020); Esophagogastroduodenoscopy (N/A, 02/05/2021); biopsy (02/05/2021); Upper gi endoscopy (03/2021); Upper gi endoscopy (N/A, 06/03/2021); and Gastric bypass (06/03/2021).  Brief Summary:  Jillian [name] is a 67 y.o. adult former smoker with obstructive sleep apnea.       Subjective:   She uses CPAP nightly.  Has full face mask.  She has lost some weight and her mask doesn't fit as well as before.  Pressure feels comfortable.  Not having sinus congestion or sore throat.   Physical Exam:   Appearance - well kempt   ENMT - no sinus tenderness, no oral exudate, no LAN, Mallampati 3 airway, no stridor  Respiratory - equal breath sounds bilaterally, no wheezing or rales  CV - s1s2 regular rate and rhythm, no murmurs  Ext - no clubbing, no edema  Skin - no rashes  Psych - normal mood and affect    Sleep Tests:  PSG 05/21/16 >> AHI 22 Auto CPAP 06/21/22 to 07/20/22 >> used on 29 of 30 nights with average 6 hrs 14 min.  Average AHI 3.4 with median CPAP 12 and 95 th percentile CPAP 14 cm H2O  Social History:  She  reports that she quit smoking about 30 years ago. Her smoking use included cigarettes. She has a 15.00 pack-year smoking history. She has been exposed to tobacco smoke. She has never used smokeless tobacco. She  reports that she does not drink alcohol and does not use drugs.  Family History:  Her family history includes Anuerysm in her paternal grandfather; Arthritis in her brother; Cancer in her father, maternal grandmother, and paternal aunt; Diabetes in her brother and sister; Healthy in her daughter and son; Hyperlipidemia in her father; Hypertension in her father and maternal grandmother; Osteoarthritis in her brother; Psoriasis in her brother and sister; Throat cancer in her brother.     Assessment/Plan:   Obstructive sleep apnea. - she is compliant with CPAP and reports benefit from therapy - she uses [facility] for her DME - her current CPAP is more than 68 yrs old - will arrange for a new Resmed auto CPAP 8 to 15 cm H2O - will arrange for CPAP mask refitting  Time Spent Involved in Patient Care on Day of Examination:  15 minutes  Follow up:   Patient Instructions  Will have [facility] arrange for a new CPAP machine and refit your CPAP mask.  You can look up CPAP mask options at CPAP.com or similar web site.  Follow up in 4 months, and this can be scheduled as a video visit.  Medication List:   Allergies as of 07/22/2022   No Known Allergies      Medication List        Accurate as of July 22, 2022  3:31 PM. If you have any questions, ask your nurse or doctor.          acetaminophen 500 MG tablet Commonly known as: TYLENOL Take 500 mg by mouth every 6 (six) hours as needed.   augmented betamethasone dipropionate 0.05 % ointment Commonly known as: DIPROLENE-AF Apply topically 2 (two) times daily as needed.   BARIATRIC MULTIVITAMINS/IRON PO Take 4 tablets by mouth daily.   buPROPion 150 MG 24 hr tablet Commonly known as: WELLBUTRIN XL Take 1 tablet (150 mg total) by mouth daily.   CALCIUM 500 PO Take 1,000 mg by mouth daily.   DULoxetine 30 MG capsule Commonly known as: CYMBALTA Take 3 capsules (90 mg total) by mouth daily.   fluocinonide 0.05 % external  solution Commonly known as: LIDEX Apply 1 application. topically 2 (two) times daily as needed (PSORIASIS).   methocarbamol 500 MG tablet Commonly known as: ROBAXIN TAKE 1 TABLET BY MOUTH 3  TIMES DAILY AS NEEDED FOR  MUSCLE SPASMS   pregabalin 150 MG capsule Commonly known as: LYRICA Take 1 capsule (150 mg total) by mouth 2 (two) times daily.   Taltz 80 MG/ML Soaj Generic drug: Ixekizumab INJECT  (1 PEN) UNDER THE SKIN EVERY 4 WEEKS   traMADol 50 MG tablet Commonly known as: ULTRAM Take 2 tablets (100 mg total) by mouth in the morning, at noon, and at bedtime.   traZODone 50 MG tablet Commonly known as: DESYREL Take 0.5-1 tablets (25-50 mg total) by mouth at bedtime as needed for sleep.   Vitamin D (Ergocalciferol) 1.25 MG (50000 UNIT) Caps capsule Commonly known as: DRISDOL TAKE 1 CAPSULE BY MOUTH EVERY 7  DAYS        Signature:  Coralyn Helling, MD [facility] Pulmonary/Critical Care Pager - [phone] 07/22/2022, 3:31 PM

## 2022-07-24 DIAGNOSIS — M62830 Muscle spasm of back: Secondary | ICD-10-CM | POA: Diagnosis not present

## 2022-07-24 DIAGNOSIS — M5416 Radiculopathy, lumbar region: Secondary | ICD-10-CM | POA: Diagnosis not present

## 2022-07-24 DIAGNOSIS — M9904 Segmental and somatic dysfunction of sacral region: Secondary | ICD-10-CM | POA: Diagnosis not present

## 2022-07-24 DIAGNOSIS — M9903 Segmental and somatic dysfunction of lumbar region: Secondary | ICD-10-CM | POA: Diagnosis not present

## 2022-07-29 ENCOUNTER — Ambulatory Visit
Admission: RE | Admit: 2022-07-29 | Discharge: 2022-07-29 | Disposition: A | Discharge status: Home / Self Care | Payer: Medicare HMO | Source: Ambulatory Visit | Attending: Family Medicine | Admitting: Family Medicine

## 2022-07-29 DIAGNOSIS — Z1231 Encounter for screening mammogram for malignant neoplasm of breast: Secondary | ICD-10-CM

## 2022-08-05 ENCOUNTER — Telehealth: Payer: Self-pay | Admitting: *Deleted

## 2022-08-05 ENCOUNTER — Other Ambulatory Visit: Payer: Self-pay | Admitting: Family Medicine

## 2022-08-05 DIAGNOSIS — R7303 Prediabetes: Secondary | ICD-10-CM

## 2022-08-05 DIAGNOSIS — E559 Vitamin D deficiency, unspecified: Secondary | ICD-10-CM

## 2022-08-05 DIAGNOSIS — E781 Pure hyperglyceridemia: Secondary | ICD-10-CM

## 2022-08-05 MED ORDER — TRAMADOL HCL 50 MG PO TABS: 100.0000 mg | ORAL_TABLET | Freq: Three times a day (TID) | ORAL | 0 refills | Status: DC

## 2022-08-05 NOTE — Telephone Encounter (Signed)
-----   Message from Alvina Chou sent at 08/04/2022 12:08 PM EDT ----- Regarding: Lab orders for Monday, 5.20.24 Patient is scheduled for CPX labs, please order future labs, Thanks , Camelia Eng

## 2022-08-05 NOTE — Telephone Encounter (Signed)
Prescription Request  08/05/2022  LOV: 08/19/2021  What is the name of the medication or equipment? traMADol (ULTRAM) 50 MG tablet   Have you contacted your pharmacy to request a refill? No   Which pharmacy would you like this sent to  [id number] - [place], [place] - [address] [place] [address] Phone: [phone] Fax: [phone]    Patient notified that their request is being sent to the clinical staff for review and that they should receive a response within 2 business days.   Please advise at Mobile [phone] (mobile)

## 2022-08-05 NOTE — Telephone Encounter (Signed)
Last office visit 08/19/21 for MWV.  Last refilled 07/07/2022 for #180 with no refills.  Next Appt: CPE 08/25/2022.

## 2022-08-06 ENCOUNTER — Telehealth: Payer: Self-pay | Admitting: Family Medicine

## 2022-08-06 NOTE — Telephone Encounter (Signed)
Pt called was told by pharmacy that she will need prior authorization to refill RX traMADol (ULTRAM) 50 MG tablet

## 2022-08-07 ENCOUNTER — Other Ambulatory Visit (HOSPITAL_COMMUNITY): Payer: Self-pay

## 2022-08-10 ENCOUNTER — Telehealth: Payer: Self-pay

## 2022-08-10 ENCOUNTER — Other Ambulatory Visit (HOSPITAL_COMMUNITY): Payer: Self-pay

## 2022-08-10 NOTE — Telephone Encounter (Signed)
PA being submitted, see additional encounter for updates.

## 2022-08-10 NOTE — Telephone Encounter (Signed)
PA has been APPROVED from 08/10/2022-03/30/2023

## 2022-08-10 NOTE — Telephone Encounter (Signed)
PA request received via provider for traMADol HCl 50MG  tablets  PA submitted to [employer] via CMM and is pending determination  *patient taking for fibromyalgia   Key: [facility]

## 2022-08-12 DIAGNOSIS — M62830 Muscle spasm of back: Secondary | ICD-10-CM | POA: Diagnosis not present

## 2022-08-12 DIAGNOSIS — M9904 Segmental and somatic dysfunction of sacral region: Secondary | ICD-10-CM | POA: Diagnosis not present

## 2022-08-12 DIAGNOSIS — M5416 Radiculopathy, lumbar region: Secondary | ICD-10-CM | POA: Diagnosis not present

## 2022-08-12 DIAGNOSIS — M9903 Segmental and somatic dysfunction of lumbar region: Secondary | ICD-10-CM | POA: Diagnosis not present

## 2022-08-14 DIAGNOSIS — G4733 Obstructive sleep apnea (adult) (pediatric): Secondary | ICD-10-CM | POA: Diagnosis not present

## 2022-08-17 ENCOUNTER — Other Ambulatory Visit (INDEPENDENT_AMBULATORY_CARE_PROVIDER_SITE_OTHER): Payer: Medicare HMO

## 2022-08-17 DIAGNOSIS — R7303 Prediabetes: Secondary | ICD-10-CM

## 2022-08-17 DIAGNOSIS — E559 Vitamin D deficiency, unspecified: Secondary | ICD-10-CM | POA: Diagnosis not present

## 2022-08-17 DIAGNOSIS — E781 Pure hyperglyceridemia: Secondary | ICD-10-CM | POA: Diagnosis not present

## 2022-08-17 LAB — LIPID PANEL
Cholesterol: 168 mg/dL (ref 0–200)
HDL: 41.1 mg/dL (ref >39.00)
LDL Cholesterol: 102 mg/dL — ABNORMAL HIGH (ref 0–99)
NonHDL: 126.63
Total CHOL/HDL Ratio: 4
Triglycerides: 125 mg/dL (ref 0.0–149.0)
VLDL: 25 mg/dL (ref 0.0–40.0)

## 2022-08-17 LAB — HEMOGLOBIN A1C: Hgb A1c MFr Bld: 5.4 % (ref 4.6–6.5)

## 2022-08-18 LAB — VITAMIN D 25 HYDROXY (VIT D DEFICIENCY, FRACTURES): VITD: 36.48 ng/mL (ref 30.00–100.00)

## 2022-08-18 NOTE — Progress Notes (Signed)
No critical labs need to be addressed urgently. We will discuss labs in detail at upcoming office visit.   

## 2022-08-20 ENCOUNTER — Ambulatory Visit (INDEPENDENT_AMBULATORY_CARE_PROVIDER_SITE_OTHER): Payer: Medicare HMO

## 2022-08-20 VITALS — Ht 67.0 in | Wt 185.0 lb

## 2022-08-20 DIAGNOSIS — Z1211 Encounter for screening for malignant neoplasm of colon: Secondary | ICD-10-CM | POA: Diagnosis not present

## 2022-08-20 DIAGNOSIS — Z0000 Encounter for general adult medical examination without abnormal findings: Secondary | ICD-10-CM | POA: Diagnosis not present

## 2022-08-20 NOTE — Progress Notes (Signed)
I connected with  Jillian [name] on 08/20/22 by a audio enabled telemedicine application and verified that I am speaking with the correct person using two identifiers.  Patient Location: Home  Provider Location: Home Office  I discussed the limitations of evaluation and management by telemedicine. The patient expressed understanding and agreed to proceed.  Subjective:   Jillian [name] is a 67 y.o. female who presents for Medicare Annual (Subsequent) preventive examination.  Review of Systems      Cardiac Risk Factors include: advanced age (>58men, >76 women);obesity (BMI >30kg/m2)     Objective:    Today's Vitals   08/20/22 0916  Weight: 185 lb (83.9 kg)  Height: 5\' 7"  (1.702 m)  PainSc: 0-No pain   Body mass index is 28.98 kg/m.     08/20/2022    9:30 AM 06/03/2021    6:00 PM 02/05/2021   12:23 PM 10/16/2020   11:10 AM 04/19/2019    9:58 AM 03/15/2019   10:36 AM 02/13/2019   11:18 AM  Advanced Directives  Does Patient Have a Medical Advance Directive? No No No No No No No  Would patient like information on creating a medical advance directive? No - Patient declined No - Patient declined No - Patient declined No - Patient declined No - Patient declined No - Patient declined No - Patient declined    Current Medications (verified) Outpatient Encounter Medications as of 08/20/2022  Medication Sig   acetaminophen (TYLENOL) 500 MG tablet Take 500 mg by mouth every 6 (six) hours as needed.   augmented betamethasone dipropionate (DIPROLENE-AF) 0.05 % ointment Apply topically 2 (two) times daily as needed.   buPROPion (WELLBUTRIN XL) 150 MG 24 hr tablet Take 1 tablet (150 mg total) by mouth daily.   Calcium Carbonate (CALCIUM 500 PO) Take 1,000 mg by mouth daily.   DULoxetine (CYMBALTA) 30 MG capsule Take 3 capsules (90 mg total) by mouth daily.   fluocinonide (LIDEX) 0.05 % external solution Apply 1 application. topically 2 (two) times daily as needed (PSORIASIS).    methocarbamol (ROBAXIN) 500 MG tablet TAKE 1 TABLET BY MOUTH 3  TIMES DAILY AS NEEDED FOR  MUSCLE SPASMS   Multiple Vitamins-Minerals (BARIATRIC MULTIVITAMINS/IRON PO) Take 4 tablets by mouth daily.   pregabalin (LYRICA) 150 MG capsule Take 1 capsule (150 mg total) by mouth 2 (two) times daily.   TALTZ 80 MG/ML SOAJ INJECT 80MG  (1 PEN) UNDER THE SKIN EVERY 4 WEEKS   traMADol (ULTRAM) 50 MG tablet Take 2 tablets (100 mg total) by mouth in the morning, at noon, and at bedtime.   traZODone (DESYREL) 50 MG tablet Take 0.5-1 tablets (25-50 mg total) by mouth at bedtime as needed for sleep.   Vitamin D, Ergocalciferol, (DRISDOL) 1.25 MG (50000 UNIT) CAPS capsule TAKE 1 CAPSULE BY MOUTH EVERY 7  DAYS   No facility-administered encounter medications on file as of 08/20/2022.    Allergies (verified) Patient has no known allergies.   History: Past Medical History:  Diagnosis Date   Anxiety    Cancer (HCC)    basal cell 2016   Dermatitis    per patient    Fibromyalgia    Prediabetes 12/03/2017   Primary localized osteoarthritis of right knee 02/07/2019   Sleep apnea    uses CPAP   UTI (urinary tract infection)    Past Surgical History:  Procedure Laterality Date   BIOPSY  02/05/2021   Procedure: BIOPSY;  Surgeon: Quentin Ore, MD;  Location: [facility];  Service: General;;   CESAREAN SECTION     times 2   ESOPHAGOGASTRODUODENOSCOPY N/A 02/05/2021   Procedure: ESOPHAGOGASTRODUODENOSCOPY (EGD);  Surgeon: Quentin Ore, MD;  Location: [facility];  Service: General;  Laterality: N/A;   EYE SURGERY Bilateral 04/24/2020   bilateral eyelids    GASTRIC BYPASS  06/03/2021   HERNIA REPAIR     PARTIAL KNEE ARTHROPLASTY Right 02/07/2019   Procedure: UNICOMPARTMENTAL KNEE;  Surgeon: Teryl Lucy, MD;  Location: [facility];  Service: Orthopedics;  Laterality: Right;   PARTIAL KNEE ARTHROPLASTY Left 03/21/2019   Procedure: UNICOMPARTMENTAL KNEE;  Surgeon: Teryl Lucy, MD;   Location: [facility];  Service: Orthopedics;  Laterality: Left;   UPPER GI ENDOSCOPY  03/2021   UPPER GI ENDOSCOPY N/A 06/03/2021   Procedure: UPPER GI ENDOSCOPY;  Surgeon: Quentin Ore, MD;  Location: [facility];  Service: General;  Laterality: N/A;   Family History  Problem Relation Age of Onset   Cancer Father        colon   Hyperlipidemia Father    Hypertension Father    Diabetes Sister    Psoriasis Sister    Healthy Daughter    Cancer Paternal Aunt        colon   Cancer Maternal Grandmother        colon   Hypertension Maternal Grandmother    Anuerysm Paternal Grandfather    Psoriasis Brother    Arthritis Brother    Osteoarthritis Brother    Diabetes Brother    Throat cancer Brother    Healthy Son    Breast cancer Neg Hx    Social History   Socioeconomic History   Marital status: Married    Spouse name: Not on file   Number of children: 2   Years of education: Not on file   Highest education level: Not on file  Occupational History   Occupation: RN  Tobacco Use   Smoking status: Former    Packs/day: 1.00    Years: 15.00    Additional pack years: 0.00    Total pack years: 15.00    Types: Cigarettes    Quit date: 03/30/1992    Years since quitting: 30.4    Passive exposure: Past   Smokeless tobacco: Never  Vaping Use   Vaping Use: Never used  Substance and Sexual Activity   Alcohol use: No   Drug use: No   Sexual activity: Not on file  Other Topics Concern   Not on file  Social History Narrative   Regular exercise-yes, but seasonal walks and swims   Diet: healthy, low carbohydrates, no caffeine   Social Determinants of Health   Financial Resource Strain: Low Risk  (08/20/2022)   Overall Financial Resource Strain (CARDIA)    Difficulty of Paying Living Expenses: Not hard at all  Food Insecurity: No Food Insecurity (08/20/2022)   Hunger Vital Sign    Worried About Running Out of Food in the Last Year: Never true    Ran Out of Food in the Last Year:  Never true  Transportation Needs: No Transportation Needs (08/20/2022)   PRAPARE - Administrator, Civil Service (Medical): No    Lack of Transportation (Non-Medical): No  Physical Activity: Insufficiently Active (08/20/2022)   Exercise Vital Sign    Days of Exercise per Week: 2 days    Minutes of Exercise per Session: 60 min  Stress: No Stress Concern Present (08/20/2022)   [employer] of Occupational Health - Occupational Stress Questionnaire  Feeling of Stress : Not at all  Social Connections: Socially Integrated (08/20/2022)   Social Connection and Isolation Panel [NHANES]    Frequency of Communication with Friends and Family: More than three times a week    Frequency of Social Gatherings with Friends and Family: More than three times a week    Attends Religious Services: More than 4 times per year    Active Member of [employer] or Organizations: Yes    Attends Banker Meetings: 1 to 4 times per year    Marital Status: Married    Tobacco Counseling Counseling given: Not Answered   Clinical Intake:  Pre-visit preparation completed: Yes  Pain : 0-10 Pain Score: 0-No pain     Nutritional Risks: None Diabetes: No  How often do you need to have someone help you when you read instructions, pamphlets, or other written materials from your doctor or pharmacy?: 1 - Never  Diabetic? no  Interpreter Needed?: No  Information entered by :: C.Kallen Delatorre LPN   Activities of Daily Living    08/20/2022    9:31 AM  In your present state of health, do you have any difficulty performing the following activities:  Hearing? 0  Vision? 0  Difficulty concentrating or making decisions? 1  Comment Occasionally  Walking or climbing stairs? 0  Dressing or bathing? 0  Doing errands, shopping? 0  Preparing Food and eating ? N  Using the Toilet? N  In the past six months, have you accidently leaked urine? N  Do you have problems with loss of bowel control? N   Managing your Medications? N  Managing your Finances? N  Housekeeping or managing your Housekeeping? N    Patient Care Team: Excell Seltzer, MD as PCP - General  Indicate any recent Medical Services you may have received from other than [facility] providers in the past year (date may be approximate).     Assessment:   This is a routine wellness examination for Jillian [name].  Hearing/Vision screen Hearing Screening - Comments:: No aids - no hearing issues Vision Screening - Comments:: Glasses - [facility]  Dietary issues and exercise activities discussed: Current Exercise Habits: Home exercise routine, Type of exercise: strength training/weights;calisthenics;walking, Time (Minutes): 60, Frequency (Times/Week): 2, Weekly Exercise (Minutes/Week): 120, Intensity: Moderate, Exercise limited by: None identified   Goals Addressed             This Visit's Progress    Patient Stated       Exercise more, get 10000 steps a day.       Depression Screen    08/19/2021    9:31 AM 10/16/2020   11:11 AM 08/16/2020    9:40 AM 03/10/2019    3:32 PM 12/03/2017    3:40 PM  PHQ 2/9 Scores  PHQ - 2 Score 0 0 0 0 0    Fall Risk    08/20/2022    9:31 AM 08/19/2021    9:31 AM 10/16/2020   11:10 AM 08/16/2020    9:36 AM  Fall Risk   Falls in the past year? 0 0 0 1  Number falls in past yr: 0   0  Injury with Fall? 0   1  Risk for fall due to : No Fall Risks     Follow up Falls prevention discussed;Falls evaluation completed       FALL RISK PREVENTION PERTAINING TO THE HOME:  Any stairs in or around the home? Yes  If so, are there any  without handrails? No  Home free of loose throw rugs in walkways, pet beds, electrical cords, etc? Yes  Adequate lighting in your home to reduce risk of falls? Yes   ASSISTIVE DEVICES UTILIZED TO PREVENT FALLS:  Life alert? No  Use of a cane, walker or w/c? No  Grab bars in the bathroom? Yes  Shower chair or bench in shower? No  Elevated toilet seat or a  handicapped toilet? No    Cognitive Function:        08/20/2022    9:32 AM  6CIT Screen  What Year? 0 points  What month? 0 points  What time? 0 points  Count back from 20 0 points  Months in reverse 0 points  Repeat phrase 2 points  Total Score 2 points    Immunizations Immunization History  Administered Date(s) Administered   Influenza-Unspecified 01/01/2020, 12/28/2020, 12/28/2021   Moderna Sars-Covid-2 Vaccination 05/23/2019, 07/04/2019   PNEUMOCOCCAL CONJUGATE-20 08/16/2020   Td 03/30/2004   Tdap 10/28/2015   Zoster, Live 08/30/2014    TDAP status: Up to date  Flu Vaccine status: Up to date  Pneumococcal vaccine status: Up to date  Covid-19 vaccine status: Information provided on how to obtain vaccines.   Qualifies for Shingles Vaccine? Yes   Zostavax completed No   Shingrix Completed?: Yes  Screening Tests Health Maintenance  Topic Date Due   Zoster Vaccines- Shingrix (1 of 2) Never done   COVID-19 Vaccine (3 - Moderna risk series) 08/01/2019   COLONOSCOPY (Pts 45-20yrs Insurance coverage will need to be confirmed)  10/06/2022   INFLUENZA VACCINE  10/29/2022   DEXA SCAN  01/30/2023   Medicare Annual Wellness (AWV)  08/20/2023   MAMMOGRAM  07/28/2024   DTaP/Tdap/Td (3 - Td or Tdap) 10/27/2025   Pneumonia Vaccine 67+ Years old  Completed   Hepatitis C Screening  Completed   HPV VACCINES  Aged Out    Health Maintenance  Health Maintenance Due  Topic Date Due   Zoster Vaccines- Shingrix (1 of 2) Never done   COVID-19 Vaccine (3 - Moderna risk series) 08/01/2019    Colorectal cancer screening: Type of screening: Colonoscopy. Completed 10/05/2017. Repeat every 5 years order placed.  Mammogram status: Completed 07/29/22. Repeat every year  Bone Density status: Completed 01/29/21. Results reflect: Bone density results: OSTEOPENIA. Repeat every 2 years. Pt declined will do next year at same time as mammogram.  Lung Cancer Screening: (Low Dose CT  Chest recommended if Age 17-80 years, 30 pack-year currently smoking OR have quit w/in 15years.) does not qualify.   Lung Cancer Screening Referral: no  Additional Screening:  Hepatitis C Screening: does qualify; Completed 08/28/2020  Vision Screening: Recommended annual ophthalmology exams for early detection of glaucoma and other disorders of the eye. Is the patient up to date with their annual eye exam?  Yes  Who is the provider or what is the name of the office in which the patient attends annual eye exams? [facility] If pt is not established with a provider, would they like to be referred to a provider to establish care? Yes .   Dental Screening: Recommended annual dental exams for proper oral hygiene  Community Resource Referral / Chronic Care Management: CRR required this visit?  No   CCM required this visit?  No      Plan:     I have personally reviewed and noted the following in the patient's chart:   Medical and social history Use of alcohol, tobacco or illicit  drugs  Current medications and supplements including opioid prescriptions. Patient is currently taking opioid prescriptions. Information provided to patient regarding non-opioid alternatives. Patient advised to discuss non-opioid treatment plan with their provider. Functional ability and status Nutritional status Physical activity Advanced directives List of other physicians Hospitalizations, surgeries, and ER visits in previous 12 months Vitals Screenings to include cognitive, depression, and falls Referrals and appointments  In addition, I have reviewed and discussed with patient certain preventive protocols, quality metrics, and best practice recommendations. A written personalized care plan for preventive services as well as general preventive health recommendations were provided to patient.     Maryan Puls, LPN   1/61/0960   Nurse Notes: Order placed for colonoscopy.

## 2022-08-20 NOTE — Patient Instructions (Signed)
Ms. Jillian [name] , Thank you for taking time to come for your Medicare Wellness Visit. I appreciate your ongoing commitment to your health goals. Please review the following plan we discussed and let me know if I can assist you in the future.   These are the goals we discussed:  Goals      Patient Stated     Exercise more, get 10000 steps a day.        This is a list of the screening recommended for you and due dates:  Health Maintenance  Topic Date Due   Zoster (Shingles) Vaccine (1 of 2) Never done   COVID-19 Vaccine (3 - Moderna risk series) 08/01/2019   Colon Cancer Screening  10/06/2022   Flu Shot  10/29/2022   DEXA scan (bone density measurement)  01/30/2023   Medicare Annual Wellness Visit  08/20/2023   Mammogram  07/28/2024   DTaP/Tdap/Td vaccine (3 - Td or Tdap) 10/27/2025   Pneumonia Vaccine  Completed   Hepatitis C Screening: USPSTF Recommendation to screen - Ages 65-79 yo.  Completed   HPV Vaccine  Aged Out    Advanced directives: none  Conditions/risks identified: Aim for 30 minutes of exercise or brisk walking, 6-8 glasses of water, and 5 servings of fruits and vegetables each day.   Next appointment: Follow up in one year for your annual wellness visit 08/24/23 @ 2:00 telephone   Preventive Care 65 Years and Older, Female Preventive care refers to lifestyle choices and visits with your health care provider that can promote health and wellness. What does preventive care include? A yearly physical exam. This is also called an annual well check. Dental exams once or twice a year. Routine eye exams. Ask your health care provider how often you should have your eyes checked. Personal lifestyle choices, including: Daily care of your teeth and gums. Regular physical activity. Eating a healthy diet. Avoiding tobacco and drug use. Limiting alcohol use. Practicing safe sex. Taking low-dose aspirin every day. Taking vitamin and mineral supplements as recommended by your  health care provider. What happens during an annual well check? The services and screenings done by your health care provider during your annual well check will depend on your age, overall health, lifestyle risk factors, and family history of disease. Counseling  Your health care provider may ask you questions about your: Alcohol use. Tobacco use. Drug use. Emotional well-being. Home and relationship well-being. Sexual activity. Eating habits. History of falls. Memory and ability to understand (cognition). Work and work Astronomer. Reproductive health. Screening  You may have the following tests or measurements: Height, weight, and BMI. Blood pressure. Lipid and cholesterol levels. These may be checked every 5 years, or more frequently if you are over 29 years old. Skin check. Lung cancer screening. You may have this screening every year starting at age 35 if you have a 30-pack-year history of smoking and currently smoke or have quit within the past 15 years. Fecal occult blood test (FOBT) of the stool. You may have this test every year starting at age 62. Flexible sigmoidoscopy or colonoscopy. You may have a sigmoidoscopy every 5 years or a colonoscopy every 10 years starting at age 60. Hepatitis C blood test. Hepatitis B blood test. Sexually transmitted disease (STD) testing. Diabetes screening. This is done by checking your blood sugar (glucose) after you have not eaten for a while (fasting). You may have this done every 1-3 years. Bone density scan. This is done to screen for osteoporosis. You  may have this done starting at age 57. Mammogram. This may be done every 1-2 years. Talk to your health care provider about how often you should have regular mammograms. Talk with your health care provider about your test results, treatment options, and if necessary, the need for more tests. Vaccines  Your health care provider may recommend certain vaccines, such as: Influenza vaccine.  This is recommended every year. Tetanus, diphtheria, and acellular pertussis (Tdap, Td) vaccine. You may need a Td booster every 10 years. Zoster vaccine. You may need this after age 16. Pneumococcal 13-valent conjugate (PCV13) vaccine. One dose is recommended after age 31. Pneumococcal polysaccharide (PPSV23) vaccine. One dose is recommended after age 60. Talk to your health care provider about which screenings and vaccines you need and how often you need them. This information is not intended to replace advice given to you by your health care provider. Make sure you discuss any questions you have with your health care provider. Document Released: 04/12/2015 Document Revised: 12/04/2015 Document Reviewed: 01/15/2015 Elsevier Interactive Patient Education  2017 [employer].  Fall Prevention in the Home Falls can cause injuries. They can happen to people of all ages. There are many things you can do to make your home safe and to help prevent falls. What can I do on the outside of my home? Regularly fix the edges of walkways and driveways and fix any cracks. Remove anything that might make you trip as you walk through a door, such as a raised step or threshold. Trim any bushes or trees on the path to your home. Use bright outdoor lighting. Clear any walking paths of anything that might make someone trip, such as rocks or tools. Regularly check to see if handrails are loose or broken. Make sure that both sides of any steps have handrails. Any raised decks and porches should have guardrails on the edges. Have any leaves, snow, or ice cleared regularly. Use sand or salt on walking paths during winter. Clean up any spills in your garage right away. This includes oil or grease spills. What can I do in the bathroom? Use night lights. Install grab bars by the toilet and in the tub and shower. Do not use towel bars as grab bars. Use non-skid mats or decals in the tub or shower. If you need to sit  down in the shower, use a plastic, non-slip stool. Keep the floor dry. Clean up any water that spills on the floor as soon as it happens. Remove soap buildup in the tub or shower regularly. Attach bath mats securely with double-sided non-slip rug tape. Do not have throw rugs and other things on the floor that can make you trip. What can I do in the bedroom? Use night lights. Make sure that you have a light by your bed that is easy to reach. Do not use any sheets or blankets that are too big for your bed. They should not hang down onto the floor. Have a firm chair that has side arms. You can use this for support while you get dressed. Do not have throw rugs and other things on the floor that can make you trip. What can I do in the kitchen? Clean up any spills right away. Avoid walking on wet floors. Keep items that you use a lot in easy-to-reach places. If you need to reach something above you, use a strong step stool that has a grab bar. Keep electrical cords out of the way. Do not use floor  polish or wax that makes floors slippery. If you must use wax, use non-skid floor wax. Do not have throw rugs and other things on the floor that can make you trip. What can I do with my stairs? Do not leave any items on the stairs. Make sure that there are handrails on both sides of the stairs and use them. Fix handrails that are broken or loose. Make sure that handrails are as long as the stairways. Check any carpeting to make sure that it is firmly attached to the stairs. Fix any carpet that is loose or worn. Avoid having throw rugs at the top or bottom of the stairs. If you do have throw rugs, attach them to the floor with carpet tape. Make sure that you have a light switch at the top of the stairs and the bottom of the stairs. If you do not have them, ask someone to add them for you. What else can I do to help prevent falls? Wear shoes that: Do not have high heels. Have rubber bottoms. Are  comfortable and fit you well. Are closed at the toe. Do not wear sandals. If you use a stepladder: Make sure that it is fully opened. Do not climb a closed stepladder. Make sure that both sides of the stepladder are locked into place. Ask someone to hold it for you, if possible. Clearly mark and make sure that you can see: Any grab bars or handrails. First and last steps. Where the edge of each step is. Use tools that help you move around (mobility aids) if they are needed. These include: Canes. Walkers. Scooters. Crutches. Turn on the lights when you go into a dark area. Replace any light bulbs as soon as they burn out. Set up your furniture so you have a clear path. Avoid moving your furniture around. If any of your floors are uneven, fix them. If there are any pets around you, be aware of where they are. Review your medicines with your doctor. Some medicines can make you feel dizzy. This can increase your chance of falling. Ask your doctor what other things that you can do to help prevent falls. This information is not intended to replace advice given to you by your health care provider. Make sure you discuss any questions you have with your health care provider. Document Released: 01/10/2009 Document Revised: 08/22/2015 Document Reviewed: 04/20/2014 Elsevier Interactive Patient Education  2017 [employer].

## 2022-08-21 DIAGNOSIS — G4733 Obstructive sleep apnea (adult) (pediatric): Secondary | ICD-10-CM | POA: Diagnosis not present

## 2022-08-25 ENCOUNTER — Other Ambulatory Visit: Payer: Self-pay | Admitting: *Deleted

## 2022-08-25 ENCOUNTER — Ambulatory Visit (INDEPENDENT_AMBULATORY_CARE_PROVIDER_SITE_OTHER): Payer: Medicare HMO | Admitting: Family Medicine

## 2022-08-25 ENCOUNTER — Encounter: Payer: Self-pay | Admitting: Family Medicine

## 2022-08-25 VITALS — BP 112/70 | HR 79 | Temp 98.1°F | Ht 67.5 in | Wt 189.4 lb

## 2022-08-25 DIAGNOSIS — L4050 Arthropathic psoriasis, unspecified: Secondary | ICD-10-CM | POA: Diagnosis not present

## 2022-08-25 DIAGNOSIS — R7303 Prediabetes: Secondary | ICD-10-CM | POA: Diagnosis not present

## 2022-08-25 DIAGNOSIS — E559 Vitamin D deficiency, unspecified: Secondary | ICD-10-CM | POA: Diagnosis not present

## 2022-08-25 DIAGNOSIS — E781 Pure hyperglyceridemia: Secondary | ICD-10-CM

## 2022-08-25 DIAGNOSIS — Z0000 Encounter for general adult medical examination without abnormal findings: Secondary | ICD-10-CM

## 2022-08-25 DIAGNOSIS — M797 Fibromyalgia: Secondary | ICD-10-CM

## 2022-08-25 MED ORDER — METHOCARBAMOL 500 MG PO TABS: ORAL_TABLET | ORAL | 0 refills | Status: DC

## 2022-08-25 MED ORDER — BETAMETHASONE DIPROPIONATE AUG 0.05 % EX OINT: TOPICAL_OINTMENT | Freq: Two times a day (BID) | CUTANEOUS | 2 refills | Status: AC | PRN

## 2022-08-25 MED ORDER — FLUOCINONIDE 0.05 % EX SOLN: 1.0000 | Freq: Two times a day (BID) | CUTANEOUS | 2 refills | Status: DC | PRN

## 2022-08-25 MED ORDER — VITAMIN D (ERGOCALCIFEROL) 1.25 MG (50000 UNIT) PO CAPS: 50000.0000 [IU] | ORAL_CAPSULE | ORAL | 2 refills | Status: DC

## 2022-08-25 MED ORDER — TRAZODONE HCL 50 MG PO TABS: 50.0000 mg | ORAL_TABLET | Freq: Every evening | ORAL | 0 refills | Status: DC | PRN

## 2022-08-25 NOTE — Progress Notes (Signed)
[facility]   Patient ID: Jillian [name], adult    DOB: [birth year], 67 y.o.   MRN: [id number]  This visit was conducted in person.  BP 112/70 (BP Location: Left Arm, Patient Position: Sitting, Cuff Size: Normal)   Pulse 79   Temp 98.1 F (36.7 C) (Temporal)   Ht 5' 7.5" (1.715 m)   Wt 189 lb 6 oz (85.9 kg)   SpO2 95%   BMI 29.22 kg/m    CC:  Chief Complaint  Patient presents with   Annual Exam    Part 2    Subjective:   HPI: Jillian [name] is a 67 y.o. adult presenting on 08/25/2022 for Annual Exam (Part 2)   The patient presents for annual medicare wellness, complete physical and review of chronic health problems. He/She also has the following acute concerns today:  The patient saw a LPN or RN for medicare wellness visit. 08/20/22  Prevention and wellness was reviewed in detail. Note reviewed and important notes copied below.   Advance directives and end of life planning reviewed in detail with patient and documented in EMR. Patient given handout on advance care directives if needed. HCPOA and living will updated if needed.    S/P sleeve gastrectomy in 05/2021, Starting weight 246  She is on bariatric vitamin with iron. Walking daily and working on building muscle.  Body mass index is 29.22 kg/m. Wt Readings from Last 3 Encounters:  08/25/22 189 lb 6 oz (85.9 kg)  08/20/22 185 lb (83.9 kg)  07/22/22 188 lb 9.6 oz (85.5 kg)    Psoriatic arthritis, fibromyalgia: followed by rheum. On talz. She would like to decrease tramadol to 1-2 daily as control is good. She  will continue cymbalta 90 mg daily, lyrica 150 mg BID.  Prediabetes  Lab Results  Component Value Date   HGBA1C 5.4 08/17/2022   Vit D: good control on supplement 50, 000 unit weekly.   The 10-year ASCVD risk score (Arnett DK, et al., 2019) is: 6%   Values used to calculate the score:     Age: 67 years     Sex: Female     Is Non-Hispanic African American: No     Diabetic: No     Tobacco  smoker: No     Systolic Blood Pressure: 112 mmHg     Is BP treated: No     HDL Cholesterol: 41.1 mg/dL     Total Cholesterol: 168 mg/dL      Relevant past medical, surgical, family and social history reviewed and updated as indicated. Interim medical history since our last visit reviewed. Allergies and medications reviewed and updated. Outpatient Medications Prior to Visit  Medication Sig Dispense Refill   acetaminophen (TYLENOL) 500 MG tablet Take 500 mg by mouth every 6 (six) hours as needed.     augmented betamethasone dipropionate (DIPROLENE-AF) 0.05 % ointment Apply topically 2 (two) times daily as needed. 50 g 0   buPROPion (WELLBUTRIN XL) 150 MG 24 hr tablet Take 1 tablet (150 mg total) by mouth daily. 100 tablet 0   Calcium Carbonate (CALCIUM 500 PO) Take 1,000 mg by mouth daily.     DULoxetine (CYMBALTA) 30 MG capsule Take 3 capsules (90 mg total) by mouth daily. 300 capsule 0   fluocinonide (LIDEX) 0.05 % external solution Apply 1 application. topically 2 (two) times daily as needed (PSORIASIS). 60 mL 2   Multiple Vitamins-Minerals (BARIATRIC MULTIVITAMINS/IRON PO) Take 4 tablets by mouth daily.  pregabalin (LYRICA) 150 MG capsule Take 1 capsule (150 mg total) by mouth 2 (two) times daily. 200 capsule 0   TALTZ 80 MG/ML SOAJ INJECT 80MG  (1 PEN) UNDER THE SKIN EVERY 4 WEEKS 3 mL 0   traMADol (ULTRAM) 50 MG tablet Take 100 mg by mouth daily as needed.     methocarbamol (ROBAXIN) 500 MG tablet TAKE 1 TABLET BY MOUTH 3  TIMES DAILY AS NEEDED FOR  MUSCLE SPASMS 270 tablet 0   traZODone (DESYREL) 50 MG tablet Take 0.5-1 tablets (25-50 mg total) by mouth at bedtime as needed for sleep. 100 tablet 0   Vitamin D, Ergocalciferol, (DRISDOL) 1.25 MG (50000 UNIT) CAPS capsule TAKE 1 CAPSULE BY MOUTH EVERY 7  DAYS 15 capsule 2   traMADol (ULTRAM) 50 MG tablet Take 2 tablets (100 mg total) by mouth in the morning, at noon, and at bedtime. 180 tablet 0   No facility-administered medications  prior to visit.     Per HPI unless specifically indicated in ROS section below Review of Systems  Constitutional:  Negative for fatigue and fever.  HENT:  Negative for ear pain.   Eyes:  Negative for pain.  Respiratory:  Negative for cough and shortness of breath.   Cardiovascular:  Negative for chest pain, palpitations and leg swelling.  Gastrointestinal:  Negative for abdominal pain.  Genitourinary:  Negative for dysuria.  Musculoskeletal:  Negative for arthralgias.  Neurological:  Negative for syncope, light-headedness and headaches.  Psychiatric/Behavioral:  Negative for dysphoric mood.    Objective:  BP 112/70 (BP Location: Left Arm, Patient Position: Sitting, Cuff Size: Normal)   Pulse 79   Temp 98.1 F (36.7 C) (Temporal)   Ht 5' 7.5" (1.715 m)   Wt 189 lb 6 oz (85.9 kg)   SpO2 95%   BMI 29.22 kg/m   Wt Readings from Last 3 Encounters:  08/25/22 189 lb 6 oz (85.9 kg)  08/20/22 185 lb (83.9 kg)  07/22/22 188 lb 9.6 oz (85.5 kg)      Physical Exam Vitals and nursing note reviewed.  Constitutional:      General: She is not in acute distress.    Appearance: Normal appearance. She is well-developed. She is not ill-appearing or toxic-appearing.  HENT:     Head: Normocephalic.     Right Ear: Hearing, tympanic membrane, ear canal and external ear normal.     Left Ear: Hearing, tympanic membrane, ear canal and external ear normal.     Nose: Nose normal.  Eyes:     General: Lids are normal. Lids are everted, no foreign bodies appreciated.     Conjunctiva/sclera: Conjunctivae normal.     Pupils: Pupils are equal, round, and reactive to light.  Neck:     Thyroid: No thyroid mass or thyromegaly.     Vascular: No carotid bruit.     Trachea: Trachea normal.  Cardiovascular:     Rate and Rhythm: Normal rate and regular rhythm.     Heart sounds: Normal heart sounds, S1 normal and S2 normal. No murmur heard.    No gallop.  Pulmonary:     Effort: Pulmonary effort is normal.  No respiratory distress.     Breath sounds: Normal breath sounds. No wheezing, rhonchi or rales.  Abdominal:     General: Bowel sounds are normal. There is no distension or abdominal bruit.     Palpations: Abdomen is soft. There is no fluid wave or mass.     Tenderness: There is no abdominal  tenderness. There is no guarding or rebound.     Hernia: No hernia is present.  Musculoskeletal:     Cervical back: Normal range of motion and neck supple.  Lymphadenopathy:     Cervical: No cervical adenopathy.  Skin:    General: Skin is warm and dry.     Findings: No rash.  Neurological:     Mental Status: She is alert.     Cranial Nerves: No cranial nerve deficit.     Sensory: No sensory deficit.  Psychiatric:        Mood and Affect: Mood is not anxious or depressed.        Speech: Speech normal.        Behavior: Behavior normal. Behavior is cooperative.        Judgment: Judgment normal.       Results for orders placed or performed in visit on 08/17/22  VITAMIN D 25 Hydroxy (Vit-D Deficiency, Fractures)  Result Value Ref Range   VITD 36.48 30.00 - 100.00 ng/mL  Lipid panel  Result Value Ref Range   Cholesterol 168 0 - 200 mg/dL   Triglycerides 161.0 0.0 - 149.0 mg/dL   HDL 96.04 >54.09 mg/dL   VLDL 81.1 0.0 - 91.4 mg/dL   LDL Cholesterol 782 (H) 0 - 99 mg/dL   Total CHOL/HDL Ratio 4    NonHDL 126.63   Hemoglobin A1c  Result Value Ref Range   Hgb A1c MFr Bld 5.4 4.6 - 6.5 %     COVID 19 screen:  No recent travel or known exposure to COVID19 The patient denies respiratory symptoms of COVID 19 at this time. The importance of social distancing was discussed today.   Assessment and Plan   The patient's preventative maintenance and recommended screening tests for an annual wellness exam were reviewed in full today. Brought up to date unless services declined.  Counselled on the importance of diet, exercise, and its role in overall health and mortality. The patient's FH and SH  was reviewed, including their home life, tobacco status, and drug and alcohol status.   Vaccines: Uptodate  Tdap,  given PNA 20 and rx for Shingrix given in past has had #1  Mammo:07/2022, q 2 years DVE/pap:nml pap/dve nml , neg co-testing 2017,   no further indicated Colon: Father with colon cancer age 68.. Colonoscopy 12/26/2010: no polyps  09/2016  Incomplete prep... Repeated 09/2017 Dr. Mechele Collin.. repeat planned 5 years... due 09/2022 Nonsmoker  Bone density: 01/2021  osteopenia -1.1.Marland KitchenMarland Kitchen repeat due in 01/2023.. will do at next mammogram.  Nonsmoker  Hep C: neg   Problem List Items Addressed This Visit     Fibromyalgia    Moderate contro  Continue cymbalta 90 mg daily and lyrica 150 mg BID.  Has weaned down to tramadol 1-2 daily prn if able. Using tylenol otherwise.      Relevant Medications   traMADol (ULTRAM) 50 MG tablet   methocarbamol (ROBAXIN) 500 MG tablet   traZODone (DESYREL) 50 MG tablet   HYPERTRIGLYCERIDEMIA    Lab Results  Component Value Date   CHOL 168 08/17/2022   HDL 41.10 08/17/2022   LDLCALC 102 (H) 08/17/2022   LDLDIRECT 94.0 08/12/2020   TRIG 125.0 08/17/2022   CHOLHDL 4 08/17/2022  Resolved S/P sleeve gastrectomy      Prediabetes    Resolved s/p gastric sleeve.      Psoriatic arthritis (HCC)    Followed by rheumatology.       Relevant Medications   traMADol (  ULTRAM) 50 MG tablet   methocarbamol (ROBAXIN) 500 MG tablet   Vitamin D deficiency    Stable, chronic.  Continue current medication.   Vit D 50,000 unit.. S/P sleeve gastrectomy continue high dose      Other Visit Diagnoses     Routine general medical examination at a health care facility    -  Primary     Kerby Nora, MD

## 2022-08-25 NOTE — Assessment & Plan Note (Addendum)
Moderate contro  Continue cymbalta 90 mg daily and lyrica 150 mg BID.  Has weaned down to tramadol 1-2 daily prn if able. Using tylenol otherwise.

## 2022-08-25 NOTE — Assessment & Plan Note (Signed)
Stable, chronic.  Continue current medication.   Vit D 50,000 unit.. S/P sleeve gastrectomy continue high dose 

## 2022-08-25 NOTE — Assessment & Plan Note (Signed)
Resolved s/p gastric sleeve.

## 2022-08-25 NOTE — Assessment & Plan Note (Signed)
Followed by rheumatology. 

## 2022-08-25 NOTE — Assessment & Plan Note (Signed)
Lab Results  Component Value Date   CHOL 168 08/17/2022   HDL 41.10 08/17/2022   LDLCALC 102 (H) 08/17/2022   LDLDIRECT 94.0 08/12/2020   TRIG 125.0 08/17/2022   CHOLHDL 4 08/17/2022   Resolved S/P sleeve gastrectomy

## 2022-08-25 NOTE — Telephone Encounter (Signed)
Last Fill: 05/07/2021 Betamethasone and 08/13/2021  Lidex  Next Visit: 9/172024  Last Visit: 07/13/2022  Dx: Psoriatic arthritis   Current Dose per office note on 07/13/2022:   Okay to refill Betamethasone DP 0.05% Cream and Lidex?

## 2022-08-25 NOTE — Patient Instructions (Signed)
Plan colonoscopy in 09/2022

## 2022-08-26 ENCOUNTER — Other Ambulatory Visit: Payer: Self-pay | Admitting: Family Medicine

## 2022-08-27 ENCOUNTER — Telehealth: Payer: Self-pay | Admitting: *Deleted

## 2022-08-27 ENCOUNTER — Other Ambulatory Visit: Payer: Self-pay | Admitting: *Deleted

## 2022-08-27 DIAGNOSIS — Z1211 Encounter for screening for malignant neoplasm of colon: Secondary | ICD-10-CM

## 2022-08-27 DIAGNOSIS — Z800 Family history of malignant neoplasm of digestive organs: Secondary | ICD-10-CM

## 2022-08-27 MED ORDER — GOLYTELY 236 G PO SOLR: 4000.0000 mL | Freq: Once | ORAL | 0 refills | Status: AC

## 2022-08-27 NOTE — Telephone Encounter (Signed)
Gastroenterology Pre-Procedure Review  Request Date: 10/21/2022  Requesting Physician: Dr. Allegra Lai  PATIENT REVIEW QUESTIONS: The patient responded to the following health history questions as indicated:    1. Are you having any GI issues? yes (IBS symptoms ) 2. Do you have a personal history of Polyps? no 3. Do you have a family history of Colon Cancer or Polyps? yes (aunt who colon cancer) 4. Diabetes Mellitus? no 5. Joint replacements in the past 12 months?no 6. Major health problems in the past 3 months?no 7. Any artificial heart valves, MVP, or defibrillator?no    MEDICATIONS & ALLERGIES:    Patient reports the following regarding taking any anticoagulation/antiplatelet therapy:   Plavix, Coumadin, Eliquis, Xarelto, Lovenox, Pradaxa, Brilinta, or Effient? no Aspirin? no  Patient confirms/reports the following medications:  Current Outpatient Medications  Medication Sig Dispense Refill   acetaminophen (TYLENOL) 500 MG tablet Take 500 mg by mouth every 6 (six) hours as needed.     augmented betamethasone dipropionate (DIPROLENE-AF) 0.05 % ointment Apply topically 2 (two) times daily as needed. 50 g 2   buPROPion (WELLBUTRIN XL) 150 MG 24 hr tablet Take 1 tablet (150 mg total) by mouth daily. 100 tablet 0   Calcium Carbonate (CALCIUM 500 PO) Take 1,000 mg by mouth daily.     DULoxetine (CYMBALTA) 30 MG capsule Take 3 capsules (90 mg total) by mouth daily. 300 capsule 0   fluocinonide (LIDEX) 0.05 % external solution Apply 1 Application topically 2 (two) times daily as needed (PSORIASIS). 60 mL 2   methocarbamol (ROBAXIN) 500 MG tablet Take 1 tablet (500 mg total) by mouth daily as needed for muscle spasms. 90 tablet 0   Multiple Vitamins-Minerals (BARIATRIC MULTIVITAMINS/IRON PO) Take 4 tablets by mouth daily.     pregabalin (LYRICA) 150 MG capsule Take 1 capsule (150 mg total) by mouth 2 (two) times daily. 200 capsule 0   TALTZ 80 MG/ML SOAJ INJECT 80MG  (1 PEN) UNDER THE SKIN EVERY 4  WEEKS 3 mL 0   traMADol (ULTRAM) 50 MG tablet Take 100 mg by mouth daily as needed.     traZODone (DESYREL) 50 MG tablet Take 1 tablet (50 mg total) by mouth at bedtime as needed for sleep. 90 tablet 0   Vitamin D, Ergocalciferol, (DRISDOL) 1.25 MG (50000 UNIT) CAPS capsule Take 1 capsule (50,000 Units total) by mouth every 7 (seven) days. 15 capsule 2   No current facility-administered medications for this visit.    Patient confirms/reports the following allergies:  No Known Allergies  No orders of the defined types were placed in this encounter.   AUTHORIZATION INFORMATION Primary Insurance: 1D#: Group #:  Secondary Insurance: 1D#: Group #:  SCHEDULE INFORMATION: Date: 10/21/2022 Time: Location: [facility]

## 2022-09-08 DIAGNOSIS — M9904 Segmental and somatic dysfunction of sacral region: Secondary | ICD-10-CM | POA: Diagnosis not present

## 2022-09-08 DIAGNOSIS — M5416 Radiculopathy, lumbar region: Secondary | ICD-10-CM | POA: Diagnosis not present

## 2022-09-08 DIAGNOSIS — M9903 Segmental and somatic dysfunction of lumbar region: Secondary | ICD-10-CM | POA: Diagnosis not present

## 2022-09-08 DIAGNOSIS — M62830 Muscle spasm of back: Secondary | ICD-10-CM | POA: Diagnosis not present

## 2022-09-14 DIAGNOSIS — G4733 Obstructive sleep apnea (adult) (pediatric): Secondary | ICD-10-CM | POA: Diagnosis not present

## 2022-09-21 DIAGNOSIS — G4733 Obstructive sleep apnea (adult) (pediatric): Secondary | ICD-10-CM | POA: Diagnosis not present

## 2022-10-05 ENCOUNTER — Other Ambulatory Visit: Payer: Self-pay | Admitting: Family Medicine

## 2022-10-06 DIAGNOSIS — M9903 Segmental and somatic dysfunction of lumbar region: Secondary | ICD-10-CM | POA: Diagnosis not present

## 2022-10-06 DIAGNOSIS — M9904 Segmental and somatic dysfunction of sacral region: Secondary | ICD-10-CM | POA: Diagnosis not present

## 2022-10-06 DIAGNOSIS — M62830 Muscle spasm of back: Secondary | ICD-10-CM | POA: Diagnosis not present

## 2022-10-06 DIAGNOSIS — M5416 Radiculopathy, lumbar region: Secondary | ICD-10-CM | POA: Diagnosis not present

## 2022-10-06 NOTE — Telephone Encounter (Signed)
Last office visit 08/25/2022 for CPE.  Last refilled Lyrica 07/07/22 for #200 with no refills.  Robaxin 08/26/22 for #90 with no refills.  Next Appt: No future appointments with PCP.

## 2022-10-07 ENCOUNTER — Other Ambulatory Visit: Payer: Self-pay | Admitting: Family Medicine

## 2022-10-07 NOTE — Telephone Encounter (Signed)
Please consider a formulary alternative. Please respond with prescription changes or obtain Prior Auth. Call [phone].  Please consider a formulary alternative. Please respond with prescription changes or obtain Prior Auth. Call [phone].   Formulary:  Flexeril 5 mg or Tizanidine.  Change Rx or complete PA for Robaxin?

## 2022-10-20 DIAGNOSIS — H524 Presbyopia: Secondary | ICD-10-CM | POA: Diagnosis not present

## 2022-10-21 ENCOUNTER — Encounter
Admission: RE | Disposition: A | Discharge status: Home / Self Care | Payer: Self-pay | Source: Home / Self Care | Attending: Gastroenterology

## 2022-10-21 ENCOUNTER — Ambulatory Visit: Payer: Medicare HMO | Admitting: General Practice

## 2022-10-21 ENCOUNTER — Ambulatory Visit
Admission: RE | Admit: 2022-10-21 | Discharge: 2022-10-21 | Disposition: A | Discharge status: Home / Self Care | Payer: Medicare HMO | Attending: Gastroenterology | Admitting: Gastroenterology

## 2022-10-21 DIAGNOSIS — Z800 Family history of malignant neoplasm of digestive organs: Secondary | ICD-10-CM | POA: Diagnosis not present

## 2022-10-21 DIAGNOSIS — F419 Anxiety disorder, unspecified: Secondary | ICD-10-CM | POA: Diagnosis not present

## 2022-10-21 DIAGNOSIS — G4733 Obstructive sleep apnea (adult) (pediatric): Secondary | ICD-10-CM | POA: Diagnosis not present

## 2022-10-21 DIAGNOSIS — Z1211 Encounter for screening for malignant neoplasm of colon: Secondary | ICD-10-CM | POA: Diagnosis not present

## 2022-10-21 DIAGNOSIS — G4730 Sleep apnea, unspecified: Secondary | ICD-10-CM | POA: Insufficient documentation

## 2022-10-21 DIAGNOSIS — Z87891 Personal history of nicotine dependence: Secondary | ICD-10-CM | POA: Insufficient documentation

## 2022-10-21 DIAGNOSIS — G709 Myoneural disorder, unspecified: Secondary | ICD-10-CM | POA: Diagnosis not present

## 2022-10-21 DIAGNOSIS — Z86010 Personal history of colonic polyps: Secondary | ICD-10-CM | POA: Diagnosis not present

## 2022-10-21 HISTORY — PX: COLONOSCOPY WITH PROPOFOL: SHX5780

## 2022-10-21 SURGERY — COLONOSCOPY WITH PROPOFOL
Anesthesia: General

## 2022-10-21 MED ORDER — PROPOFOL 500 MG/50ML IV EMUL
INTRAVENOUS | Status: DC | PRN
  Administered 2022-10-21: 180 ug/kg/min via INTRAVENOUS

## 2022-10-21 MED ORDER — SODIUM CHLORIDE 0.9 % IV SOLN: INTRAVENOUS | Status: DC

## 2022-10-21 MED ORDER — LIDOCAINE HCL (CARDIAC) PF 100 MG/5ML IV SOSY
PREFILLED_SYRINGE | INTRAVENOUS | Status: DC | PRN
  Administered 2022-10-21: 80 mg via INTRAVENOUS

## 2022-10-21 MED ORDER — PROPOFOL 10 MG/ML IV BOLUS
INTRAVENOUS | Status: DC | PRN
Start: 2022-10-21 — End: 2022-10-21
  Administered 2022-10-21: 90 mg via INTRAVENOUS
  Administered 2022-10-21: 10 mg via INTRAVENOUS

## 2022-10-21 NOTE — Op Note (Signed)
[facility] Patient Name: Jillian [name] Procedure Date: 10/21/2022 10:04 AM MRN: [id number] Account #: [id number] Date of Birth: May 17, 1955 Admit Type: Outpatient Age: 67 Room: [facility] ENDO ROOM 4 Gender: Female Note Status: Finalized Instrument Name: Nelda Marseille [id number] Procedure:             Colonoscopy Indications:           Screening for colon cancer: Family history of                         colorectal cancer in multiple 2nd degree relatives,                         Last colonoscopy: September 2012 Providers:             Toney Reil MD, MD Referring MD:          Excell Seltzer MD, MD (Referring MD) Medicines:             General Anesthesia Complications:         No immediate complications. Estimated blood loss: None. Procedure:             Pre-Anesthesia Assessment:                        - Prior to the procedure, a History and Physical was                         performed, and patient medications and allergies were                         reviewed. The patient is competent. The risks and                         benefits of the procedure and the sedation options and                         risks were discussed with the patient. All questions                         were answered and informed consent was obtained.                         Patient identification and proposed procedure were                         verified by the physician, the nurse, the                         anesthesiologist, the anesthetist and the technician                         in the pre-procedure area in the procedure room in the                         endoscopy suite. Mental Status Examination: alert and                         oriented. Airway Examination: normal oropharyngeal  airway and neck mobility. Respiratory Examination:                         clear to auscultation. CV Examination: normal.                         Prophylactic  Antibiotics: The patient does not require                         prophylactic antibiotics. Prior Anticoagulants: The                         patient has taken no anticoagulant or antiplatelet                         agents. ASA Grade Assessment: II - A patient with mild                         systemic disease. After reviewing the risks and                         benefits, the patient was deemed in satisfactory                         condition to undergo the procedure. The anesthesia                         plan was to use general anesthesia. Immediately prior                         to administration of medications, the patient was                         re-assessed for adequacy to receive sedatives. The                         heart rate, respiratory rate, oxygen saturations,                         blood pressure, adequacy of pulmonary ventilation, and                         response to care were monitored throughout the                         procedure. The physical status of the patient was                         re-assessed after the procedure.                        After obtaining informed consent, the colonoscope was                         passed under direct vision. Throughout the procedure,                         the patient's blood pressure, pulse, and oxygen  saturations were monitored continuously. The                         Colonoscope was introduced through the anus and                         advanced to the the cecum, identified by appendiceal                         orifice and ileocecal valve. The colonoscopy was                         performed without difficulty. The patient tolerated                         the procedure well. The quality of the bowel                         preparation was good. The ileocecal valve, appendiceal                         orifice, and rectum were photographed. Findings:      The perianal and digital  rectal examinations were normal. Pertinent       negatives include normal sphincter tone and no palpable rectal lesions.      The entire examined colon appeared normal.      The retroflexed view of the distal rectum and anal verge was normal and       showed no anal or rectal abnormalities. Impression:            - The entire examined colon is normal.                        - The distal rectum and anal verge are normal on                         retroflexion view.                        - No specimens collected. Recommendation:        - Discharge patient to home (with escort).                        - Resume previous diet today.                        - Continue present medications.                        - Repeat colonoscopy in 5 years for screening purposes. Procedure Code(s):     --- Professional ---                        B2841, Colorectal cancer screening; colonoscopy on                         individual not meeting criteria for high risk Diagnosis Code(s):     --- Professional ---  Z80.0, Family history of malignant neoplasm of                         digestive organs CPT copyright 2022 American Medical Association. All rights reserved. The codes documented in this report are preliminary and upon coder review may  be revised to meet current compliance requirements. Dr. Libby Maw Toney Reil MD, MD 10/21/2022 10:40:44 AM This report has been signed electronically. Number of Addenda: 0 Note Initiated On: 10/21/2022 10:04 AM Scope Withdrawal Time: 0 hours 12 minutes 23 seconds  Total Procedure Duration: 0 hours 16 minutes 49 seconds  Estimated Blood Loss:  Estimated blood loss: none.      [facility]

## 2022-10-21 NOTE — Anesthesia Postprocedure Evaluation (Signed)
Anesthesia Post Note  Patient: CLORIS FLIPPO  Procedure(s) Performed: COLONOSCOPY WITH PROPOFOL  Patient location during evaluation: Endoscopy Anesthesia Type: General Level of consciousness: awake and alert Pain management: pain level controlled Vital Signs Assessment: post-procedure vital signs reviewed and stable Respiratory status: spontaneous breathing, nonlabored ventilation, respiratory function stable and patient connected to nasal cannula oxygen Cardiovascular status: blood pressure returned to baseline and stable Postop Assessment: no apparent nausea or vomiting Anesthetic complications: no  No notable events documented.   Last Vitals:  Vitals:   10/21/22 0843 10/21/22 1040  BP: 101/66 (!) 89/47  Pulse: 72 72  Resp: 20 19  Temp: (!) 35.8 C (!) 36.3 C  SpO2: 99% 98%    Last Pain:  Vitals:   10/21/22 1100  TempSrc:   PainSc: 0-No pain                 Stephanie Coup

## 2022-10-21 NOTE — Anesthesia Preprocedure Evaluation (Signed)
Anesthesia Evaluation  Patient identified by MRN, date of birth, ID band Patient awake    Reviewed: Allergy & Precautions, NPO status , Patient's Chart, lab work & pertinent test results  Airway Mallampati: II  TM Distance: >3 FB Neck ROM: Full    Dental no notable dental hx. (+) Teeth Intact, Dental Advisory Given   Pulmonary sleep apnea and Continuous Positive Airway Pressure Ventilation , former smoker   Pulmonary exam normal breath sounds clear to auscultation       Cardiovascular Exercise Tolerance: Good Normal cardiovascular exam Rhythm:Regular Rate:Normal     Neuro/Psych  PSYCHIATRIC DISORDERS Anxiety      Neuromuscular disease    GI/Hepatic Neg liver ROS,,,  Endo/Other  negative endocrine ROS    Renal/GU Lab Results      Component                Value               Date                      CREATININE               0.87                05/21/2021                K                        4.5                 05/21/2021                    Musculoskeletal   Abdominal   Peds  Hematology Lab Results      Component                Value               Date                       HGB                      13.9                05/21/2021                HCT                      41.4                05/21/2021                    PLT                      233                 05/21/2021               Anesthesia Other Findings   Reproductive/Obstetrics                             Anesthesia Physical Anesthesia Plan  ASA: 2  Anesthesia Plan: General   Post-op Pain Management: Minimal or no pain anticipated   Induction: Intravenous  PONV Risk Score and Plan: 3  and Propofol infusion, TIVA and Ondansetron  Airway Management Planned: Nasal Cannula  Additional Equipment: None  Intra-op Plan:   Post-operative Plan:   Informed Consent: I have reviewed the patients History and Physical, chart,  labs and discussed the procedure including the risks, benefits and alternatives for the proposed anesthesia with the patient or authorized representative who has indicated his/her understanding and acceptance.     Dental advisory given  Plan Discussed with: CRNA and Surgeon  Anesthesia Plan Comments: (Discussed risks of anesthesia with patient, including possibility of difficulty with spontaneous ventilation under anesthesia necessitating airway intervention, PONV, and rare risks such as cardiac or respiratory or neurological events, and allergic reactions. Discussed the role of CRNA in patient's perioperative care. Patient understands.)       Anesthesia Quick Evaluation

## 2022-10-21 NOTE — H&P (Signed)
Arlyss Repress, MD [address]  [address]  [place], [place] [address]  Main: [phone]  Fax: [phone] Pager: [phone]  Primary Care Physician:  Excell Seltzer, MD Primary Gastroenterologist:  Dr. Arlyss Repress  Pre-Procedure History & Physical: HPI:  Jillian [name] is a 67 y.o. adult is here for an colonoscopy.   Past Medical History:  Diagnosis Date   Anxiety    Cancer (HCC)    basal cell 2016   Dermatitis    per patient    Fibromyalgia    Prediabetes 12/03/2017   Primary localized osteoarthritis of right knee 02/07/2019   Sleep apnea    uses CPAP   UTI (urinary tract infection)     Past Surgical History:  Procedure Laterality Date   BIOPSY  02/05/2021   Procedure: BIOPSY;  Surgeon: Quentin Ore, MD;  Location: [facility];  Service: General;;   CESAREAN SECTION     times 2   ESOPHAGOGASTRODUODENOSCOPY N/A 02/05/2021   Procedure: ESOPHAGOGASTRODUODENOSCOPY (EGD);  Surgeon: Quentin Ore, MD;  Location: [facility];  Service: General;  Laterality: N/A;   EYE SURGERY Bilateral 04/24/2020   bilateral eyelids    GASTRIC BYPASS  06/03/2021   HERNIA REPAIR     PARTIAL KNEE ARTHROPLASTY Right 02/07/2019   Procedure: UNICOMPARTMENTAL KNEE;  Surgeon: Teryl Lucy, MD;  Location: [facility];  Service: Orthopedics;  Laterality: Right;   PARTIAL KNEE ARTHROPLASTY Left 03/21/2019   Procedure: UNICOMPARTMENTAL KNEE;  Surgeon: Teryl Lucy, MD;  Location: [facility];  Service: Orthopedics;  Laterality: Left;   UPPER GI ENDOSCOPY  03/2021   UPPER GI ENDOSCOPY N/A 06/03/2021   Procedure: UPPER GI ENDOSCOPY;  Surgeon: Quentin Ore, MD;  Location: [facility];  Service: General;  Laterality: N/A;    Prior to Admission medications   Medication Sig Start Date End Date Taking? Authorizing Provider  acetaminophen (TYLENOL) 500 MG tablet Take 500 mg by mouth every 6 (six) hours as needed.   Yes [provider]  augmented betamethasone  dipropionate (DIPROLENE-AF) 0.05 % ointment Apply topically 2 (two) times daily as needed. 08/25/22  Yes Deveshwar, Janalyn Rouse, MD  buPROPion (WELLBUTRIN XL) 150 MG 24 hr tablet Take 1 tablet (150 mg total) by mouth daily. 07/07/22  Yes Bedsole, Amy E, MD  Calcium Carbonate (CALCIUM 500 PO) Take 1,000 mg by mouth daily.   Yes [provider]  cyclobenzaprine (FLEXERIL) 5 MG tablet TAKE 1 TABLET DAILY AS     NEEDED FOR MUSCLE SPASMS 10/07/22  Yes Bedsole, Amy E, MD  DULoxetine (CYMBALTA) 30 MG capsule Take 3 capsules (90 mg total) by mouth daily. 07/07/22  Yes Bedsole, Amy E, MD  fluocinonide (LIDEX) 0.05 % external solution Apply 1 Application topically 2 (two) times daily as needed (PSORIASIS). 08/25/22  Yes Deveshwar, Janalyn Rouse, MD  Multiple Vitamins-Minerals (BARIATRIC MULTIVITAMINS/IRON PO) Take 4 tablets by mouth daily.   Yes [provider]  pregabalin (LYRICA) 150 MG capsule TAKE 1 CAPSULE TWICE DAILY 10/06/22  Yes Bedsole, Amy E, MD  TALTZ 80 MG/ML SOAJ INJECT 80MG  (1 PEN) UNDER THE SKIN EVERY 4 WEEKS 06/15/22  Yes Gearldine Bienenstock, PA-C  traMADol (ULTRAM) 50 MG tablet Take 100 mg by mouth daily as needed.   Yes [provider]  traZODone (DESYREL) 50 MG tablet Take 1 tablet (50 mg total) by mouth at bedtime as needed for sleep. 08/25/22  Yes Bedsole, Amy E, MD  Vitamin D, Ergocalciferol, (DRISDOL) 1.25 MG (50000 UNIT) CAPS capsule Take  1 capsule (50,000 Units total) by mouth every 7 (seven) days. 08/25/22  Yes Excell Seltzer, MD    Allergies as of 08/27/2022   (No Known Allergies)    Family History  Problem Relation Age of Onset   Cancer Father        colon   Hyperlipidemia Father    Hypertension Father    Diabetes Sister    Psoriasis Sister    Healthy Daughter    Cancer Paternal Aunt        colon   Cancer Maternal Grandmother        colon   Hypertension Maternal Grandmother    Anuerysm Paternal Grandfather    Psoriasis Brother    Arthritis Brother    Osteoarthritis  Brother    Diabetes Brother    Throat cancer Brother    Healthy Son    Breast cancer Neg Hx     Social History   Socioeconomic History   Marital status: Married    Spouse name: Not on file   Number of children: 2   Years of education: Not on file   Highest education level: Not on file  Occupational History   Occupation: RN  Tobacco Use   Smoking status: Former    Current packs/day: 0.00    Average packs/day: 1 pack/day for 15.0 years (15.0 ttl pk-yrs)    Types: Cigarettes    Start date: 03/30/1977    Quit date: 03/30/1992    Years since quitting: 30.5    Passive exposure: Past   Smokeless tobacco: Never  Vaping Use   Vaping status: Never Used  Substance and Sexual Activity   Alcohol use: No   Drug use: No   Sexual activity: Not on file  Other Topics Concern   Not on file  Social History Narrative   Regular exercise-yes, but seasonal walks and swims   Diet: healthy, low carbohydrates, no caffeine   Social Determinants of Health   Financial Resource Strain: Low Risk  (08/20/2022)   Overall Financial Resource Strain (CARDIA)    Difficulty of Paying Living Expenses: Not hard at all  Food Insecurity: No Food Insecurity (08/20/2022)   Hunger Vital Sign    Worried About Running Out of Food in the Last Year: Never true    Ran Out of Food in the Last Year: Never true  Transportation Needs: No Transportation Needs (08/20/2022)   PRAPARE - Administrator, Civil Service (Medical): No    Lack of Transportation (Non-Medical): No  Physical Activity: Insufficiently Active (08/20/2022)   Exercise Vital Sign    Days of Exercise per Week: 2 days    Minutes of Exercise per Session: 60 min  Stress: No Stress Concern Present (08/20/2022)   [employer] of Occupational Health - Occupational Stress Questionnaire    Feeling of Stress : Not at all  Social Connections: Socially Integrated (08/20/2022)   Social Connection and Isolation Panel [NHANES]    Frequency of  Communication with Friends and Family: More than three times a week    Frequency of Social Gatherings with Friends and Family: More than three times a week    Attends Religious Services: More than 4 times per year    Active Member of [employer] or Organizations: Yes    Attends Banker Meetings: 1 to 4 times per year    Marital Status: Married  Catering manager Violence: Not At Risk (08/20/2022)   Humiliation, Afraid, Rape, and Kick questionnaire    Fear  of Current or Ex-Partner: No    Emotionally Abused: No    Physically Abused: No    Sexually Abused: No    Review of Systems: See HPI, otherwise negative ROS  Physical Exam: BP 101/66   Pulse 72   Temp (!) 96.4 F (35.8 C) (Temporal)   Resp 20   Ht 5\' 8"  (1.727 m)   Wt 86.5 kg   SpO2 99%   BMI 28.98 kg/m  General:   Alert,  pleasant and cooperative in NAD Head:  Normocephalic and atraumatic. Neck:  Supple; no masses or thyromegaly. Lungs:  Clear throughout to auscultation.    Heart:  Regular rate and rhythm. Abdomen:  Soft, nontender and nondistended. Normal bowel sounds, without guarding, and without rebound.   Neurologic:  Alert and  oriented x4;  grossly normal neurologically.  Impression/Plan: KIMORA STANKOVIC is here for an colonoscopy to be performed for colon cancer screening  Risks, benefits, limitations, and alternatives regarding  colonoscopy have been reviewed with the patient.  Questions have been answered.  All parties agreeable.   Lannette Donath, MD  10/21/2022, 9:16 AM

## 2022-10-21 NOTE — Transfer of Care (Signed)
Immediate Anesthesia Transfer of Care Note  Patient: Jillian [name]  Procedure(s) Performed: COLONOSCOPY WITH PROPOFOL  Patient Location: PACU  Anesthesia Type:General  Level of Consciousness: sedated  Airway & Oxygen Therapy: Patient Spontanous Breathing  Post-op Assessment: Report given to RN and Post -op Vital signs reviewed and stable  Post vital signs: Reviewed and stable  Last Vitals: See PACU flow sheet.  Vitals Value Taken Time  BP    Temp    Pulse 70 10/21/22 1040  Resp    SpO2 99 % 10/21/22 1040  Vitals shown include unfiled device data.  Last Pain:  Vitals:   10/21/22 0843  TempSrc: Temporal  PainSc: 0-No pain         Complications: No notable events documented.

## 2022-10-22 ENCOUNTER — Encounter: Payer: Self-pay | Admitting: Gastroenterology

## 2022-10-22 ENCOUNTER — Telehealth (HOSPITAL_BASED_OUTPATIENT_CLINIC_OR_DEPARTMENT_OTHER): Payer: Medicare HMO | Admitting: Pulmonary Disease

## 2022-10-28 DIAGNOSIS — H40003 Preglaucoma, unspecified, bilateral: Secondary | ICD-10-CM | POA: Diagnosis not present

## 2022-10-28 DIAGNOSIS — H2512 Age-related nuclear cataract, left eye: Secondary | ICD-10-CM | POA: Diagnosis not present

## 2022-10-28 DIAGNOSIS — H02403 Unspecified ptosis of bilateral eyelids: Secondary | ICD-10-CM | POA: Diagnosis not present

## 2022-10-28 DIAGNOSIS — H538 Other visual disturbances: Secondary | ICD-10-CM | POA: Diagnosis not present

## 2022-10-28 DIAGNOSIS — M3501 Sicca syndrome with keratoconjunctivitis: Secondary | ICD-10-CM | POA: Diagnosis not present

## 2022-11-03 DIAGNOSIS — M9904 Segmental and somatic dysfunction of sacral region: Secondary | ICD-10-CM | POA: Diagnosis not present

## 2022-11-03 DIAGNOSIS — M62830 Muscle spasm of back: Secondary | ICD-10-CM | POA: Diagnosis not present

## 2022-11-03 DIAGNOSIS — M5416 Radiculopathy, lumbar region: Secondary | ICD-10-CM | POA: Diagnosis not present

## 2022-11-03 DIAGNOSIS — M9903 Segmental and somatic dysfunction of lumbar region: Secondary | ICD-10-CM | POA: Diagnosis not present

## 2022-11-09 DIAGNOSIS — H2511 Age-related nuclear cataract, right eye: Secondary | ICD-10-CM | POA: Diagnosis not present

## 2022-11-09 DIAGNOSIS — H2512 Age-related nuclear cataract, left eye: Secondary | ICD-10-CM | POA: Diagnosis not present

## 2022-11-12 ENCOUNTER — Encounter: Payer: Self-pay | Admitting: Family Medicine

## 2022-11-12 ENCOUNTER — Other Ambulatory Visit: Payer: Self-pay | Admitting: Family Medicine

## 2022-11-12 ENCOUNTER — Telehealth (INDEPENDENT_AMBULATORY_CARE_PROVIDER_SITE_OTHER): Payer: Medicare HMO | Admitting: Family Medicine

## 2022-11-12 VITALS — Ht 67.5 in

## 2022-11-12 DIAGNOSIS — B370 Candidal stomatitis: Secondary | ICD-10-CM

## 2022-11-12 MED ORDER — NYSTATIN 100000 UNIT/ML MT SUSP: 5.0000 mL | Freq: Four times a day (QID) | OROMUCOSAL | 0 refills | Status: DC

## 2022-11-12 NOTE — Progress Notes (Signed)
VIRTUAL VISIT A virtual visit is felt to be most appropriate for this patient at this time.   I connected with the patient on 11/12/22 at 12:00 PM EDT by virtual telehealth platform and verified that I am speaking with the correct person using two identifiers.   I discussed the limitations, risks, security and privacy concerns of performing an evaluation and management service by  virtual telehealth platform and the availability of in person appointments. I also discussed with the patient that there may be a patient responsible charge related to this service. The patient expressed understanding and agreed to proceed.  Patient location: Home Provider Location: [facility] Participants: Kerby Nora and Janeal Holmes   Chief Complaint  Patient presents with   Jillian [name]    History of Present Illness:  67 y.o. adult patient of ,  E, MD presents with  new onset oral lesion.   She reports  new onset white plaques in mouth.. on roof of mouth, tongue. ongoing  Oral surfaces are sore. No plaque on buccal mucosa. Ongoing for several weeks.  She is using Talz for psoriasis... using betamethasone in right ear.   No SOB, no trouble swallowing      COVID 19 screen No recent travel or known exposure to COVID19 The patient denies respiratory symptoms of COVID 19 at this time.  The importance of social distancing was discussed today.   Review of Systems  Constitutional:  Negative for chills and fever.  HENT:  Negative for congestion and ear pain.   Eyes:  Negative for pain and redness.  Respiratory:  Negative for cough and shortness of breath.   Cardiovascular:  Negative for chest pain, palpitations and leg swelling.  Gastrointestinal:  Negative for abdominal pain, blood in stool, constipation, diarrhea, nausea and vomiting.  Genitourinary:  Negative for dysuria.  Musculoskeletal:  Negative for falls and myalgias.  Skin:  Negative for rash.  Neurological:  Negative for  dizziness.  Psychiatric/Behavioral:  Negative for depression. The patient is not nervous/anxious.       Past Medical History:  Diagnosis Date   Anxiety    Cancer (HCC)    basal cell 2016   Dermatitis    per patient    Fibromyalgia    Prediabetes 12/03/2017   Primary localized osteoarthritis of right knee 02/07/2019   Sleep apnea    uses CPAP   UTI (urinary tract infection)     reports that she quit smoking about 30 years ago. Her smoking use included cigarettes. She started smoking about 45 years ago. She has a 15 pack-year smoking history. She has been exposed to tobacco smoke. She has never used smokeless tobacco. She reports that she does not drink alcohol and does not use drugs.   Current Outpatient Medications:    acetaminophen (TYLENOL) 500 MG tablet, Take 500 mg by mouth every 6 (six) hours as needed., Disp: , Rfl:    augmented betamethasone dipropionate (DIPROLENE-AF) 0.05 % ointment, Apply topically 2 (two) times daily as needed., Disp: 50 g, Rfl: 2   buPROPion (WELLBUTRIN XL) 150 MG 24 hr tablet, Take 1 tablet (150 mg total) by mouth daily., Disp: 100 tablet, Rfl: 0   Calcium Carbonate (CALCIUM 500 PO), Take 1,000 mg by mouth daily., Disp: , Rfl:    cyclobenzaprine (FLEXERIL) 5 MG tablet, TAKE 1 TABLET DAILY AS     NEEDED FOR MUSCLE SPASMS, Disp: 30 tablet, Rfl: 0   DULoxetine (CYMBALTA) 30 MG capsule, Take 3 capsules (90 mg total)  by mouth daily., Disp: 300 capsule, Rfl: 0   fluocinonide (LIDEX) 0.05 % external solution, Apply 1 Application topically 2 (two) times daily as needed (PSORIASIS)., Disp: 60 mL, Rfl: 2   Multiple Vitamins-Minerals (BARIATRIC MULTIVITAMINS/IRON PO), Take 4 tablets by mouth daily., Disp: , Rfl:    nystatin (MYCOSTATIN) 100000 UNIT/ML suspension, Take 5 mLs (500,000 Units total) by mouth 4 (four) times daily., Disp: 473 mL, Rfl: 0   pregabalin (LYRICA) 150 MG capsule, TAKE 1 CAPSULE TWICE DAILY, Disp: 200 capsule, Rfl: 0   TALTZ 80 MG/ML SOAJ, INJECT  80MG  (1 PEN) UNDER THE SKIN EVERY 4 WEEKS, Disp: 3 mL, Rfl: 0   traMADol (ULTRAM) 50 MG tablet, Take 100 mg by mouth daily as needed., Disp: , Rfl:    traZODone (DESYREL) 50 MG tablet, Take 1 tablet (50 mg total) by mouth at bedtime as needed for sleep., Disp: 90 tablet, Rfl: 0   Vitamin D, Ergocalciferol, (DRISDOL) 1.25 MG (50000 UNIT) CAPS capsule, Take 1 capsule (50,000 Units total) by mouth every 7 (seven) days., Disp: 15 capsule, Rfl: 2   Observations/Objective: Height 5' 7.5" (1.715 m).  Physical Exam Constitutional:      General: The patient is not in acute distress. Pulmonary:     Effort: Pulmonary effort is normal. No respiratory distress.  Neurological:     Mental Status: The patient is alert and oriented to person, place, and time.  Psychiatric:        Mood and Affect: Mood normal.        Behavior: Behavior normal.    Assessment and Plan Oral candida Assessment & Plan: Acute, most likely secondary to  medication side effect causing oral candidiasis.   Complete QID Nystatin swish and spit, continue 48 hour after symptoms resolved. ER and  return precautions provided   Other orders -     Nystatin; Take 5 mLs (500,000 Units total) by mouth 4 (four) times daily.  Dispense: 473 mL; Refill: 0      I discussed the assessment and treatment plan with the patient. The patient was provided an opportunity to ask questions and all were answered. The patient agreed with the plan and demonstrated an understanding of the instructions.   The patient was advised to call back or seek an in-person evaluation if the symptoms worsen or if the condition fails to improve as anticipated.     Kerby Nora, MD

## 2022-11-12 NOTE — Telephone Encounter (Signed)
Last office visit 08/25/2022 for CPE.  Last refilled 08/05/2022 #180 with no refills.  Next Appt: 11/12/2022 Virtual for oral thrush.

## 2022-11-12 NOTE — Assessment & Plan Note (Signed)
Acute, most likely secondary to  medication side effect causing oral candidiasis.   Complete QID Nystatin swish and spit, continue 48 hour after symptoms resolved. ER and  return precautions provided

## 2022-11-21 ENCOUNTER — Other Ambulatory Visit: Payer: Self-pay | Admitting: Physician Assistant

## 2022-11-21 DIAGNOSIS — L409 Psoriasis, unspecified: Secondary | ICD-10-CM

## 2022-11-21 DIAGNOSIS — Z79899 Other long term (current) drug therapy: Secondary | ICD-10-CM

## 2022-11-21 DIAGNOSIS — L4050 Arthropathic psoriasis, unspecified: Secondary | ICD-10-CM

## 2022-11-21 DIAGNOSIS — G4733 Obstructive sleep apnea (adult) (pediatric): Secondary | ICD-10-CM | POA: Diagnosis not present

## 2022-11-23 NOTE — Telephone Encounter (Signed)
Last Fill: 06/15/2022  Labs: 07/13/2022 CBC and CMP are normal. LMOM labs are due  TB Gold: 07/13/2022  TB Gold is negative.   Next Visit: 12/10/2022  Last Visit: 07/13/2022  DX: Psoriatic arthritis   Current Dose per office note 07/13/2022: Taltz 80 mg subcu every 4 weeks   Okay to refill Taltz?

## 2022-11-23 NOTE — Telephone Encounter (Signed)
Overdue to update lab work

## 2022-11-25 ENCOUNTER — Telehealth: Payer: Self-pay | Admitting: *Deleted

## 2022-11-25 NOTE — Telephone Encounter (Signed)
Patient contacted the office stating she received the message stating she needs labs updated. Patient states she currently has thrush and a vaginal yeast infection. Patient states she is currently on Nystatin and Monastat over the counter for treatment. Patient states she does not plan on taking her medication until she has completed her medication and her infection has cleared. Patient states she has an appointment on 12/10/2022 and would like to know if she can wait until then to update her labs.

## 2022-11-25 NOTE — Telephone Encounter (Signed)
Ok to update lab work at [facility]

## 2022-11-25 NOTE — Telephone Encounter (Signed)
Attempted to contact the patient and left message to advise patient she is okay to update labs at her follow up visit.

## 2022-11-26 NOTE — Progress Notes (Unsigned)
Office Visit Note  Patient: Jillian [name]             Date of Birth: [birth year]           MRN: [id number]             PCP: Excell Seltzer, MD Referring: Excell Seltzer, MD Visit Date: 12/10/2022 Occupation: @GUAROCC @  Subjective:  Thrush    History of Present Illness: ARLESIA ZACCARIA is a 67 y.o. adult with history of psoriatic arthritis and osteoarthritis. Patient is prescribed Taltz 80 mg sq injections every 4 weeks. Patient initiated Altamease Oiler on 07/02/2021.  She is about 7 to 10 days overdue for Taltz injection.  She has been treated with nystatin oral solution for the past 3 weeks for oral thrush.  She completed the course of nystatin but has had recurrence of symptoms.  She is also noticing some vaginal yeast.  She plans to continue to hold Taltz until the infection is completely cleared. She started to notice a flare of psoriasis with the gap in therapy.  She has had a recurrence of some psoriasis on her elbows, ears, and palmar aspect of both hands.  She has been using topical steroids as needed. She denies any increased joint pain or joint swelling at this time.  She denies any nocturnal pain or difficulty with ADLs.  She has been walking on a daily basis as well as going to [employer] twice weekly.   Activities of Daily Living:  Patient reports morning stiffness for 4-5 hours.   Patient Denies nocturnal pain.  Difficulty dressing/grooming: Denies Difficulty climbing stairs: Denies Difficulty getting out of chair: Denies Difficulty using hands for taps, buttons, cutlery, and/or writing: Denies  Review of Systems  Constitutional:  Negative for fatigue.  HENT:  Positive for mouth sores and mouth dryness.   Eyes:  Positive for dryness.  Respiratory:  Negative for shortness of breath.   Cardiovascular:  Negative for chest pain and palpitations.  Gastrointestinal:  Positive for constipation. Negative for blood in stool and diarrhea.  Endocrine: Negative for increased  urination.  Genitourinary:  Negative for involuntary urination.  Musculoskeletal:  Positive for joint pain, gait problem, joint pain, myalgias, morning stiffness, muscle tenderness and myalgias. Negative for joint swelling and muscle weakness.  Skin:  Positive for color change. Negative for rash, hair loss and sensitivity to sunlight.  Allergic/Immunologic: Positive for susceptible to infections.  Neurological:  Negative for dizziness and headaches.  Hematological:  Negative for swollen glands.  Psychiatric/Behavioral:  Negative for depressed mood and sleep disturbance. The patient is not nervous/anxious.     PMFS History:  Patient Active Problem List   Diagnosis Date Noted   Oral candida 11/12/2022   Family history of colon cancer 10/21/2022   Screening for colon cancer 10/21/2022   Psoriatic arthritis (HCC) 05/07/2021   Psoriasis 05/07/2021   Primary osteoarthritis of both hands 05/07/2021   Primary osteoarthritis of both feet 05/07/2021   Pitting of nails 01/23/2020   OSA (obstructive sleep apnea) 04/12/2019   S/P left unicompartmental knee replacement 03/21/2019   Osteoarthritis of left knee 03/09/2019   Primary localized osteoarthritis of right knee 02/07/2019   Status post right partial knee replacement 02/07/2019   Prediabetes 12/03/2017   Non-restorative sleep 02/27/2016   Vitamin D deficiency 10/28/2015   Generalized anxiety disorder 11/25/2012   HYPERTRIGLYCERIDEMIA 12/20/2008   MENOPAUSE, EARLY 10/04/2008   Fibromyalgia 10/04/2008    Past Medical History:  Diagnosis Date   Anxiety  Cancer (HCC)    basal cell 2016   Dermatitis    per patient    Fibromyalgia    Prediabetes 12/03/2017   Primary localized osteoarthritis of right knee 02/07/2019   Sleep apnea    uses CPAP   Thrush    UTI (urinary tract infection)     Family History  Problem Relation Age of Onset   Cancer Father        colon   Hyperlipidemia Father    Hypertension Father    Diabetes  Sister    Psoriasis Sister    Psoriasis Brother    Arthritis Brother    Osteoarthritis Brother    Diabetes Brother    Stroke Brother    Throat cancer Brother    Cancer Maternal Grandmother        colon   Hypertension Maternal Grandmother    Anuerysm Paternal Grandfather    Healthy Daughter    Healthy Son    Cancer Paternal Aunt        colon   Breast cancer Neg Hx    Past Surgical History:  Procedure Laterality Date   BIOPSY  02/05/2021   Procedure: BIOPSY;  Surgeon: Stechschulte, Hyman Hopes, MD;  Location: [facility];  Service: General;;   CESAREAN SECTION     times 2   COLONOSCOPY WITH PROPOFOL N/A 10/21/2022   Procedure: COLONOSCOPY WITH PROPOFOL;  Surgeon: Toney Reil, MD;  Location: [facility];  Service: Gastroenterology;  Laterality: N/A;   ESOPHAGOGASTRODUODENOSCOPY N/A 02/05/2021   Procedure: ESOPHAGOGASTRODUODENOSCOPY (EGD);  Surgeon: Quentin Ore, MD;  Location: [facility];  Service: General;  Laterality: N/A;   EYE SURGERY Bilateral 04/24/2020   bilateral eyelids    GASTRIC BYPASS  06/03/2021   HERNIA REPAIR     PARTIAL KNEE ARTHROPLASTY Right 02/07/2019   Procedure: UNICOMPARTMENTAL KNEE;  Surgeon: Teryl Lucy, MD;  Location: [facility];  Service: Orthopedics;  Laterality: Right;   PARTIAL KNEE ARTHROPLASTY Left 03/21/2019   Procedure: UNICOMPARTMENTAL KNEE;  Surgeon: Teryl Lucy, MD;  Location: [facility];  Service: Orthopedics;  Laterality: Left;   UPPER GI ENDOSCOPY  03/2021   UPPER GI ENDOSCOPY N/A 06/03/2021   Procedure: UPPER GI ENDOSCOPY;  Surgeon: Quentin Ore, MD;  Location: [facility];  Service: General;  Laterality: N/A;   Social History   Social History Narrative   Regular exercise-yes, but seasonal walks and swims   Diet: healthy, low carbohydrates, no caffeine   Immunization History  Administered Date(s) Administered   Influenza-Unspecified 01/01/2020, 12/28/2020, 12/28/2021   Moderna Sars-Covid-2 Vaccination 05/23/2019,  07/04/2019   PNEUMOCOCCAL CONJUGATE-20 08/16/2020   Td 03/30/2004   Tdap 10/28/2015   Zoster Recombinant(Shingrix) 07/09/2021, 08/03/2022   Zoster, Live 08/30/2014     Objective: Vital Signs: BP 109/70 (BP Location: Left Arm, Patient Position: Sitting, Cuff Size: Normal)   Pulse 82   Resp 14   Ht 5\' 8"  (1.727 m)   Wt 182 lb (82.6 kg)   BMI 27.67 kg/m    Physical Exam Vitals and nursing note reviewed.  Constitutional:      Appearance: She is well-developed.  HENT:     Head: Normocephalic and atraumatic.  Eyes:     Conjunctiva/sclera: Conjunctivae normal.     Pupils: Pupils are equal, round, and reactive to light.  Cardiovascular:     Rate and Rhythm: Normal rate and regular rhythm.     Heart sounds: Normal heart sounds.  Pulmonary:     Effort: Pulmonary effort is normal.  Breath sounds: Normal breath sounds.  Abdominal:     General: Bowel sounds are normal.     Palpations: Abdomen is soft.  Musculoskeletal:     Cervical back: Normal range of motion and neck supple.  Skin:    General: Skin is warm and dry.     Capillary Refill: Capillary refill takes less than 2 seconds.     Comments: Erythema and mild scaling noted on palms of both hands and extensor surface of both elbows.   Neurological:     Mental Status: She is alert and oriented to person, place, and time.  Psychiatric:        Behavior: Behavior normal.      Musculoskeletal Exam: C-spine has good ROM.  Limited mobility of lumbar spine.  Tenderness of both SI joints-mild.  Shoulders joints have good ROM with some tenderness anteriorly.  Elbow joints, wrist joints, MCPs, PIPs, and DIPs good ROM with no synovitis.  PIP and DIP thickening noted.  Hip joints have good ROM.  Partial knee replacements have good ROM.  Ankle joints have good ROM with no tenderness or joint swelling.  No evidence of achilles tendonitis.   CDAI Exam: CDAI Score: -- Patient Global: --; Provider Global: -- Swollen: --; Tender: -- Joint  Exam 12/10/2022   No joint exam has been documented for this visit   There is currently no information documented on the homunculus. Go to the Rheumatology activity and complete the homunculus joint exam.  Investigation: No additional findings.  Imaging: No results found.  Recent Labs: Lab Results  Component Value Date   WBC 5.7 07/13/2022   HGB 12.9 07/13/2022   PLT 219 07/13/2022   NA 141 07/13/2022   K 4.6 07/13/2022   CL 105 07/13/2022   CO2 29 07/13/2022   GLUCOSE 92 07/13/2022   BUN 21 07/13/2022   CREATININE 0.76 07/13/2022   BILITOT 0.4 07/13/2022   ALKPHOS 76 09/19/2021   AST 18 07/13/2022   ALT 20 07/13/2022   PROT 6.4 07/13/2022   ALBUMIN 4.3 09/19/2021   CALCIUM 9.7 07/13/2022   GFRAA 83 07/16/2020   QFTBGOLDPLUS NEGATIVE 07/13/2022    Speciality Comments: tremfya -started September 16, 2020; Taltz started 07/02/21  Procedures:  No procedures performed Allergies: Patient has no known allergies.      Assessment / Plan:     Visit Diagnoses: Psoriatic arthritis (HCC) - inflammatory arthritis, psoriasis, plantar fasciitis, fingernail pitting, RF-, HLA-B27-, chronic SI joint pain, family hx of psoriasis and psoriatic arthritis: She has no synovitis or dactylitis on examination today.  No evidence of Achilles tendinitis or plantar fasciitis.  Mild SI joint tenderness noted bilaterally which is unchanged.  She has not had any signs or symptoms of uveitis.  Patient is prescribed Taltz 80 mg sq injections every 4 weeks.  She is about 7 to 10 days overdue for Taltz injection due to holding Taltz while using nystatin oral solution for oral thrush.  She tried using nystatin x 3 weeks but has had a recurrence of thrush.  Plan to send in a course of Diflucan  100 mg daily x7 days.  Patient will continue to hold Taltz until the thrush has completely resolved and there is no recurrence. She will notify us if she develop signs or symptoms of a flare.  She will follow-up in the  office in 3 months or sooner if needed.  Psoriasis - Diagnosed by Dr. Harlon Flor.  Palmar psoriasis has cleared since initiating Taltz.  Patient is about  7 to 10 days overdue for her Toltz injection and started to notice a recurrence of psoriasis on the palmar aspect of both hands and on the extensor surface of both elbows.  She has been using topical agents as prescribed.  Prior to the interruption therapy her psoriasis was well-controlled on Taltz.  High risk medication use - Taltz 80 mg sq injections every 4 weeks. Patient initiated Altamease Oiler on 07/02/2021. Previous therapy includes: Tremfya.  TB gold negative on 07/13/22.  CBC and CMP updated on 07/13/22.  Orders for CBC and CMP released today.  Her next lab work will be due in December and every 3 months to monitor for drug toxicity. Discussed the importance of holding Taltz if she develops signs or symptoms of an infection and to resume once infection has completely cleared.  She will continue to hold Taltz until the oral thrush has resolved and she has completed the course of Diflucan. - Plan: CBC with Differential/Platelet, COMPLETE METABOLIC PANEL WITH GFR  Pitting of nails  Primary osteoarthritis of both hands: PIP and DIP thickening consistent with osteoarthritis of both hands.  No synovitis or dactylitis noted.  Complete fist formation noted bilaterally.  Status post right partial knee replacement - Dr. Rogelio Seen on 02/07/2019.  Doing well.  Good range of motion with no discomfort.  S/P left unicompartmental knee replacement - Dr. Rogelio Seen on 03/21/2019.  Doing well.  Good range of motion with no discomfort.  No warmth or effusion noted.  Primary osteoarthritis of both feet: Ankle joints have good range of motion with no tenderness or synovitis.  Chronic SI joint pain: Mild tenderness over both SI joints noted.  Unchanged.  Family history of psoriatic arthritis - Brother and sister  Fibromyalgia: She takes tramadol 50 mg 1  tablet every 8 hours as needed for pain relief and Lyrica 150 mg twice daily.  She also remains on Cymbalta 90 mg daily.  Osteopenia of multiple sites - DEXA scan on January 29, 2021 DEXA scan showed T score of -1.1 in the left femoral neck.  Due to update DEXA November 2024  Oral thrush: Patient has been treated with nystatin oral solution x 3 weeks.  She has had a recurrence of oral thrush and has also started to notice some vaginal yeast.  Plan to send in a course of Diflucan 100 mg daily x7 days.  She will notify us if her symptoms persist or worsen.  She will continue to hold Taltz until the infection has completely cleared.  Other medical conditions are listed as follows:   Non-restorative sleep  Prediabetes  OSA (obstructive sleep apnea)  Generalized anxiety disorder  Vitamin D deficiency  Status post gastric bypass for obesity  Orders: Orders Placed This Encounter  Procedures   CBC with Differential/Platelet   COMPLETE METABOLIC PANEL WITH GFR   Meds ordered this encounter  Medications   fluconazole (DIFLUCAN) 100 MG tablet    Sig: Take 1 tablet (100 mg total) by mouth daily.    Dispense:  7 tablet    Refill:  0    Follow-Up Instructions: Return in about 3 months (around 03/11/2023) for Psoriatic arthritis.   Gearldine Bienenstock, PA-C  Note - This record has been created using Dragon software.  Chart creation errors have been sought, but may not always  have been located. Such creation errors do not reflect on  the standard of medical care.

## 2022-12-01 DIAGNOSIS — M9904 Segmental and somatic dysfunction of sacral region: Secondary | ICD-10-CM | POA: Diagnosis not present

## 2022-12-01 DIAGNOSIS — M9903 Segmental and somatic dysfunction of lumbar region: Secondary | ICD-10-CM | POA: Diagnosis not present

## 2022-12-01 DIAGNOSIS — M5416 Radiculopathy, lumbar region: Secondary | ICD-10-CM | POA: Diagnosis not present

## 2022-12-01 DIAGNOSIS — M62830 Muscle spasm of back: Secondary | ICD-10-CM | POA: Diagnosis not present

## 2022-12-07 ENCOUNTER — Encounter: Payer: Self-pay | Admitting: Ophthalmology

## 2022-12-09 ENCOUNTER — Other Ambulatory Visit: Payer: Self-pay | Admitting: Family Medicine

## 2022-12-09 MED ORDER — NYSTATIN 100000 UNIT/ML MT SUSP: 5.0000 mL | Freq: Four times a day (QID) | OROMUCOSAL | 0 refills | Status: DC

## 2022-12-10 ENCOUNTER — Encounter: Payer: Self-pay | Admitting: Physician Assistant

## 2022-12-10 ENCOUNTER — Ambulatory Visit: Payer: Medicare HMO | Attending: Physician Assistant | Admitting: Physician Assistant

## 2022-12-10 VITALS — BP 109/70 | HR 82 | Resp 14 | Ht 68.0 in | Wt 182.0 lb

## 2022-12-10 DIAGNOSIS — Z96652 Presence of left artificial knee joint: Secondary | ICD-10-CM | POA: Diagnosis not present

## 2022-12-10 DIAGNOSIS — F411 Generalized anxiety disorder: Secondary | ICD-10-CM

## 2022-12-10 DIAGNOSIS — M19042 Primary osteoarthritis, left hand: Secondary | ICD-10-CM

## 2022-12-10 DIAGNOSIS — M8589 Other specified disorders of bone density and structure, multiple sites: Secondary | ICD-10-CM

## 2022-12-10 DIAGNOSIS — Z840 Family history of diseases of the skin and subcutaneous tissue: Secondary | ICD-10-CM

## 2022-12-10 DIAGNOSIS — M19071 Primary osteoarthritis, right ankle and foot: Secondary | ICD-10-CM

## 2022-12-10 DIAGNOSIS — L4050 Arthropathic psoriasis, unspecified: Secondary | ICD-10-CM | POA: Diagnosis not present

## 2022-12-10 DIAGNOSIS — M533 Sacrococcygeal disorders, not elsewhere classified: Secondary | ICD-10-CM

## 2022-12-10 DIAGNOSIS — Z96651 Presence of right artificial knee joint: Secondary | ICD-10-CM | POA: Diagnosis not present

## 2022-12-10 DIAGNOSIS — G8929 Other chronic pain: Secondary | ICD-10-CM

## 2022-12-10 DIAGNOSIS — R7303 Prediabetes: Secondary | ICD-10-CM

## 2022-12-10 DIAGNOSIS — B370 Candidal stomatitis: Secondary | ICD-10-CM

## 2022-12-10 DIAGNOSIS — M19041 Primary osteoarthritis, right hand: Secondary | ICD-10-CM | POA: Diagnosis not present

## 2022-12-10 DIAGNOSIS — L608 Other nail disorders: Secondary | ICD-10-CM

## 2022-12-10 DIAGNOSIS — L409 Psoriasis, unspecified: Secondary | ICD-10-CM

## 2022-12-10 DIAGNOSIS — E559 Vitamin D deficiency, unspecified: Secondary | ICD-10-CM

## 2022-12-10 DIAGNOSIS — M797 Fibromyalgia: Secondary | ICD-10-CM

## 2022-12-10 DIAGNOSIS — G478 Other sleep disorders: Secondary | ICD-10-CM

## 2022-12-10 DIAGNOSIS — Z79899 Other long term (current) drug therapy: Secondary | ICD-10-CM | POA: Diagnosis not present

## 2022-12-10 DIAGNOSIS — G4733 Obstructive sleep apnea (adult) (pediatric): Secondary | ICD-10-CM

## 2022-12-10 DIAGNOSIS — Z9884 Bariatric surgery status: Secondary | ICD-10-CM

## 2022-12-10 DIAGNOSIS — M19072 Primary osteoarthritis, left ankle and foot: Secondary | ICD-10-CM

## 2022-12-10 MED ORDER — FLUCONAZOLE 100 MG PO TABS: 100.0000 mg | ORAL_TABLET | Freq: Every day | ORAL | 0 refills | Status: DC

## 2022-12-10 NOTE — Discharge Instructions (Signed)

## 2022-12-10 NOTE — Patient Instructions (Signed)
Standing Labs We placed an order today for your standing lab work.   Please have your standing labs drawn in December and every 3 months   Please have your labs drawn 2 weeks prior to your appointment so that the provider can discuss your lab results at your appointment, if possible.  Please note that you may see your imaging and lab results in MyChart before we have reviewed them. We will contact you once all results are reviewed. Please allow our office up to 72 hours to thoroughly review all of the results before contacting the office for clarification of your results.  WALK-IN LAB HOURS  Monday through Thursday from 8:00 am -12:30 pm and 1:00 pm-5:00 pm and Friday from 8:00 am-12:00 pm.  Patients with office visits requiring labs will be seen before walk-in labs.  You may encounter longer than normal wait times. Please allow additional time. Wait times may be shorter on  Monday and Thursday afternoons.  We do not book appointments for walk-in labs. We appreciate your patience and understanding with our staff.   Labs are drawn by [facility]. Please bring your co-pay at the time of your lab draw.  You may receive a bill from [facility] for your lab work.  Please note if you are on Hydroxychloroquine and and an order has been placed for a Hydroxychloroquine level,  you will need to have it drawn 4 hours or more after your last dose.  If you wish to have your labs drawn at another location, please call the office 24 hours in advance so we can fax the orders.  The office is located at [address], [place], [place] [address]   If you have any questions regarding directions or hours of operation,  please call [phone].   As a reminder, please drink plenty of water prior to coming for your lab work. Thanks!

## 2022-12-11 LAB — COMPLETE METABOLIC PANEL WITH GFR
AG Ratio: 2 (calc) (ref 1.0–2.5)
ALT: 20 U/L (ref 6–29)
AST: 19 U/L (ref 10–35)
Albumin: 4.3 g/dL (ref 3.6–5.1)
Alkaline phosphatase (APISO): 73 U/L (ref 37–153)
BUN: 17 mg/dL (ref 7–25)
CO2: 29 mmol/L (ref 20–32)
Calcium: 10 mg/dL (ref 8.6–10.4)
Chloride: 104 mmol/L (ref 98–110)
Creat: 0.95 mg/dL (ref 0.50–1.05)
Globulin: 2.1 g/dL (ref 1.9–3.7)
Glucose, Bld: 87 mg/dL (ref 65–99)
Potassium: 4.8 mmol/L (ref 3.5–5.3)
Sodium: 137 mmol/L (ref 135–146)
Total Bilirubin: 0.5 mg/dL (ref 0.2–1.2)
Total Protein: 6.4 g/dL (ref 6.1–8.1)
eGFR: 65 mL/min/{1.73_m2} (ref >=60)

## 2022-12-11 LAB — CBC WITH DIFFERENTIAL/PLATELET
Absolute Monocytes: 694 {cells}/uL (ref 200–950)
Basophils Absolute: 48 {cells}/uL (ref 0–200)
Basophils Relative: 0.7 %
Eosinophils Absolute: 163 {cells}/uL (ref 15–500)
Eosinophils Relative: 2.4 %
HCT: 37.2 % (ref 35.0–45.0)
Hemoglobin: 12.6 g/dL (ref 11.7–15.5)
Lymphs Abs: 2169 {cells}/uL (ref 850–3900)
MCH: 31.4 pg (ref 27.0–33.0)
MCHC: 33.9 g/dL (ref 32.0–36.0)
MCV: 92.8 fL (ref 80.0–100.0)
MPV: 11.4 fL (ref 7.5–12.5)
Monocytes Relative: 10.2 %
Neutro Abs: 3726 {cells}/uL (ref 1500–7800)
Neutrophils Relative %: 54.8 %
Platelets: 201 10*3/uL (ref 140–400)
RBC: 4.01 10*6/uL (ref 3.80–5.10)
RDW: 11.9 % (ref 11.0–15.0)
Total Lymphocyte: 31.9 %
WBC: 6.8 10*3/uL (ref 3.8–10.8)

## 2022-12-11 NOTE — Progress Notes (Signed)
CBC and CMP WNL

## 2022-12-15 ENCOUNTER — Ambulatory Visit: Payer: Medicare HMO | Admitting: Anesthesiology

## 2022-12-15 ENCOUNTER — Encounter
Admission: RE | Disposition: A | Discharge status: Home / Self Care | Payer: Self-pay | Source: Home / Self Care | Attending: Ophthalmology

## 2022-12-15 ENCOUNTER — Ambulatory Visit: Payer: Medicare HMO | Admitting: Physician Assistant

## 2022-12-15 ENCOUNTER — Encounter: Payer: Self-pay | Admitting: Ophthalmology

## 2022-12-15 ENCOUNTER — Ambulatory Visit
Admission: RE | Admit: 2022-12-15 | Discharge: 2022-12-15 | Disposition: A | Discharge status: Home / Self Care | Payer: Medicare HMO | Attending: Ophthalmology | Admitting: Ophthalmology

## 2022-12-15 ENCOUNTER — Other Ambulatory Visit: Payer: Self-pay

## 2022-12-15 DIAGNOSIS — Z9884 Bariatric surgery status: Secondary | ICD-10-CM | POA: Diagnosis not present

## 2022-12-15 DIAGNOSIS — H2512 Age-related nuclear cataract, left eye: Secondary | ICD-10-CM | POA: Insufficient documentation

## 2022-12-15 DIAGNOSIS — Z85828 Personal history of other malignant neoplasm of skin: Secondary | ICD-10-CM | POA: Diagnosis not present

## 2022-12-15 DIAGNOSIS — Z87891 Personal history of nicotine dependence: Secondary | ICD-10-CM | POA: Diagnosis not present

## 2022-12-15 DIAGNOSIS — M797 Fibromyalgia: Secondary | ICD-10-CM | POA: Insufficient documentation

## 2022-12-15 DIAGNOSIS — G4733 Obstructive sleep apnea (adult) (pediatric): Secondary | ICD-10-CM | POA: Diagnosis not present

## 2022-12-15 DIAGNOSIS — R7303 Prediabetes: Secondary | ICD-10-CM | POA: Diagnosis not present

## 2022-12-15 DIAGNOSIS — G4730 Sleep apnea, unspecified: Secondary | ICD-10-CM | POA: Insufficient documentation

## 2022-12-15 HISTORY — PX: CATARACT EXTRACTION W/PHACO: SHX586

## 2022-12-15 SURGERY — PHACOEMULSIFICATION, CATARACT, WITH IOL INSERTION
Anesthesia: Monitor Anesthesia Care | Laterality: Left

## 2022-12-15 MED ORDER — LACTATED RINGERS IV SOLN: INTRAVENOUS | Status: DC

## 2022-12-15 MED ORDER — BRIMONIDINE TARTRATE-TIMOLOL 0.2-0.5 % OP SOLN
OPHTHALMIC | Status: DC | PRN
  Administered 2022-12-15: 1 [drp] via OPHTHALMIC

## 2022-12-15 MED ORDER — TETRACAINE HCL 0.5 % OP SOLN
1.0000 [drp] | OPHTHALMIC | Status: DC | PRN
  Administered 2022-12-15 (×3): 1 [drp] via OPHTHALMIC

## 2022-12-15 MED ORDER — MOXIFLOXACIN HCL 0.5 % OP SOLN
OPHTHALMIC | Status: DC | PRN
  Administered 2022-12-15: .2 mL via OPHTHALMIC

## 2022-12-15 MED ORDER — MIDAZOLAM HCL 2 MG/2ML IJ SOLN
INTRAMUSCULAR | Status: DC | PRN
  Administered 2022-12-15: 1 mg via INTRAVENOUS

## 2022-12-15 MED ORDER — SIGHTPATH DOSE#1 NA CHONDROIT SULF-NA HYALURON 40-17 MG/ML IO SOLN
INTRAOCULAR | Status: DC | PRN
  Administered 2022-12-15: 1 mL via INTRAOCULAR

## 2022-12-15 MED ORDER — SIGHTPATH DOSE#1 BSS IO SOLN
INTRAOCULAR | Status: DC | PRN
  Administered 2022-12-15: 2 mL

## 2022-12-15 MED ORDER — SIGHTPATH DOSE#1 BSS IO SOLN
INTRAOCULAR | Status: DC | PRN
  Administered 2022-12-15: 15 mL via INTRAOCULAR

## 2022-12-15 MED ORDER — SIGHTPATH DOSE#1 BSS IO SOLN
INTRAOCULAR | Status: DC | PRN
  Administered 2022-12-15: 48 mL via OPHTHALMIC

## 2022-12-15 MED ORDER — FENTANYL CITRATE (PF) 100 MCG/2ML IJ SOLN
INTRAMUSCULAR | Status: DC | PRN
  Administered 2022-12-15: 50 ug via INTRAVENOUS

## 2022-12-15 MED ORDER — ARMC OPHTHALMIC DILATING DROPS
1.0000 | OPHTHALMIC | Status: DC | PRN
  Administered 2022-12-15 (×3): 1 via OPHTHALMIC

## 2022-12-15 SURGICAL SUPPLY — 11 items
ANGLE REVERSE CUT SHRT 25GA (CUTTER) ×1
CATARACT SUITE SIGHTPATH (MISCELLANEOUS) ×1
CYSTOTOME ANGL RVRS SHRT 25G (CUTTER) ×2 IMPLANT
FEE CATARACT SUITE SIGHTPATH (MISCELLANEOUS) ×2 IMPLANT
GLOVE BIOGEL PI IND STRL 8 (GLOVE) ×2 IMPLANT
GLOVE SURG LX STRL 8.0 MICRO (GLOVE) ×2 IMPLANT
LENS CLAREON VIVITY 26.0 ×1 IMPLANT
LENS IOL CLRN VT YLW 26.0 IMPLANT
NDL FILTER BLUNT 18X1 1/2 (NEEDLE) ×2 IMPLANT
NEEDLE FILTER BLUNT 18X1 1/2 (NEEDLE) ×1
SYR 3ML LL SCALE MARK (SYRINGE) ×2 IMPLANT

## 2022-12-15 NOTE — Transfer of Care (Signed)
Immediate Anesthesia Transfer of Care Note  Patient: Jillian [name]  Procedure(s) Performed: CATARACT EXTRACTION PHACO AND INTRAOCULAR LENS PLACEMENT (IOC) LEFT MALYUGIN  CLAREON VIVITY LENS 3.03 00:22.3 (Left)  Patient Location: PACU  Anesthesia Type: MAC  Level of Consciousness: awake, alert  and patient cooperative  Airway and Oxygen Therapy: Patient Spontanous Breathing and Patient connected to supplemental oxygen  Post-op Assessment: Post-op Vital signs reviewed, Patient's Cardiovascular Status Stable, Respiratory Function Stable, Patent Airway and No signs of Nausea or vomiting  Post-op Vital Signs: Reviewed and stable  Complications: No notable events documented.

## 2022-12-15 NOTE — Anesthesia Postprocedure Evaluation (Signed)
Anesthesia Post Note  Patient: Jillian [name]  Procedure(s) Performed: CATARACT EXTRACTION PHACO AND INTRAOCULAR LENS PLACEMENT (IOC) LEFT MALYUGIN  CLAREON VIVITY LENS 3.03 00:22.3 (Left)  Patient location during evaluation: PACU Anesthesia Type: MAC Level of consciousness: awake and alert Pain management: pain level controlled Vital Signs Assessment: post-procedure vital signs reviewed and stable Respiratory status: spontaneous breathing, nonlabored ventilation, respiratory function stable and patient connected to nasal cannula oxygen Cardiovascular status: stable and blood pressure returned to baseline Postop Assessment: no apparent nausea or vomiting Anesthetic complications: no   No notable events documented.   Last Vitals:  Vitals:   12/15/22 0931 12/15/22 0938  BP: 107/63 104/68  Pulse: 78 77  Resp:  12  Temp: (!) 36.4 C (!) 36.4 C  SpO2: 95% 97%    Last Pain:  Vitals:   12/15/22 0820  TempSrc: Temporal  PainSc: 0-No pain                 Yaffa Seckman C Tawana Pasch

## 2022-12-15 NOTE — Op Note (Signed)
PREOPERATIVE DIAGNOSIS:  Nuclear sclerotic cataract of the left eye.   POSTOPERATIVE DIAGNOSIS:  Nuclear sclerotic cataract of the left eye.   OPERATIVE PROCEDURE:ORPROCALL@   SURGEON:  Galen Manila, MD.   ANESTHESIA:  Anesthesiologist: Marisue Humble, MD CRNA: Genia Del, CRNA; Barbette Hair, CRNA  1.      Managed anesthesia care. 2.     0.40ml of Shugarcaine was instilled following the paracentesis   COMPLICATIONS:  None.   TECHNIQUE:   Stop and chop   DESCRIPTION OF PROCEDURE:  The patient was examined and consented in the preoperative holding area where the aforementioned topical anesthesia was applied to the left eye and then brought back to the Operating Room where the left eye was prepped and draped in the usual sterile ophthalmic fashion and a lid speculum was placed. A paracentesis was created with the side port blade and the anterior chamber was filled with viscoelastic. A near clear corneal incision was performed with the steel keratome. A continuous curvilinear capsulorrhexis was performed with a cystotome followed by the capsulorrhexis forceps. Hydrodissection and hydrodelineation were carried out with BSS on a blunt cannula. The lens was removed in a stop and chop  technique and the remaining cortical material was removed with the irrigation-aspiration handpiece. The capsular bag was inflated with viscoelastic and the Technis [id number] lens was placed in the capsular bag without complication. The remaining viscoelastic was removed from the eye with the irrigation-aspiration handpiece. The wounds were hydrated. The anterior chamber was flushed with BSS and the eye was inflated to physiologic pressure. 0.25ml Vigamox was placed in the anterior chamber. The wounds were found to be water tight. The eye was dressed with Combigan. The patient was given protective glasses to wear throughout the day and a shield with which to sleep tonight. The patient was also given drops with which to  begin a drop regimen today and will follow-up with me in one day. Implant Name Type Inv. Item Serial No. Manufacturer Lot No. LRB No. Used Action  CLAREON VIVITY IOL   [id number]   Left 1 Implanted    Procedure(s): CATARACT EXTRACTION PHACO AND INTRAOCULAR LENS PLACEMENT (IOC) LEFT MALYUGIN  CLAREON VIVITY LENS 3.03 00:22.3 (Left)  Electronically signed: Galen Manila 12/15/2022 9:30 AM

## 2022-12-15 NOTE — H&P (Signed)
[facility]   Primary Care Physician:  Excell Seltzer, MD Ophthalmologist: Dr. Maren Reamer  Pre-Procedure History & Physical: HPI:  Jillian [name] is a 67 y.o. adult here for cataract surgery.   Past Medical History:  Diagnosis Date   Anxiety    Cancer (HCC)    basal cell 2016   Dermatitis    per patient    Fibromyalgia    Prediabetes 12/03/2017   Primary localized osteoarthritis of right knee 02/07/2019   Sleep apnea    uses CPAP   Thrush    UTI (urinary tract infection)     Past Surgical History:  Procedure Laterality Date   BIOPSY  02/05/2021   Procedure: BIOPSY;  Surgeon: Quentin Ore, MD;  Location: [facility];  Service: General;;   CESAREAN SECTION     times 2   COLONOSCOPY WITH PROPOFOL N/A 10/21/2022   Procedure: COLONOSCOPY WITH PROPOFOL;  Surgeon: Toney Reil, MD;  Location: [facility];  Service: Gastroenterology;  Laterality: N/A;   ESOPHAGOGASTRODUODENOSCOPY N/A 02/05/2021   Procedure: ESOPHAGOGASTRODUODENOSCOPY (EGD);  Surgeon: Quentin Ore, MD;  Location: [facility];  Service: General;  Laterality: N/A;   EYE SURGERY Bilateral 04/24/2020   bilateral eyelids    GASTRIC BYPASS  06/03/2021   HERNIA REPAIR     PARTIAL KNEE ARTHROPLASTY Right 02/07/2019   Procedure: UNICOMPARTMENTAL KNEE;  Surgeon: Teryl Lucy, MD;  Location: [facility];  Service: Orthopedics;  Laterality: Right;   PARTIAL KNEE ARTHROPLASTY Left 03/21/2019   Procedure: UNICOMPARTMENTAL KNEE;  Surgeon: Teryl Lucy, MD;  Location: [facility];  Service: Orthopedics;  Laterality: Left;   UPPER GI ENDOSCOPY  03/2021   UPPER GI ENDOSCOPY N/A 06/03/2021   Procedure: UPPER GI ENDOSCOPY;  Surgeon: Quentin Ore, MD;  Location: [facility];  Service: General;  Laterality: N/A;    Prior to Admission medications   Medication Sig Start Date End Date Taking? Authorizing Provider  acetaminophen (TYLENOL) 500 MG tablet Take 500 mg by mouth every 6 (six) hours as needed.    Yes [provider]  buPROPion (WELLBUTRIN XL) 150 MG 24 hr tablet Take 1 tablet (150 mg total) by mouth daily. 07/07/22  Yes Bedsole, Amy E, MD  Calcium Carbonate (CALCIUM 500 PO) Take 1,000 mg by mouth daily.   Yes [provider]  DULoxetine (CYMBALTA) 30 MG capsule Take 3 capsules (90 mg total) by mouth daily. 07/07/22  Yes Bedsole, Amy E, MD  ibuprofen (ADVIL) 200 MG tablet Take 200 mg by mouth every 6 (six) hours as needed.   Yes [provider]  Multiple Vitamins-Minerals (BARIATRIC MULTIVITAMINS/IRON PO) Take 1 capsule by mouth daily.   Yes [provider]  pregabalin (LYRICA) 150 MG capsule TAKE 1 CAPSULE TWICE DAILY 10/06/22  Yes Bedsole, Amy E, MD  traMADol (ULTRAM) 50 MG tablet Take 1 tablet (50 mg total) by mouth every 8 (eight) hours as needed. 11/13/22  Yes Bedsole, Amy E, MD  traZODone (DESYREL) 50 MG tablet Take 1 tablet (50 mg total) by mouth at bedtime as needed for sleep. 08/25/22  Yes Bedsole, Amy E, MD  Vitamin D, Ergocalciferol, (DRISDOL) 1.25 MG (50000 UNIT) CAPS capsule Take 1 capsule (50,000 Units total) by mouth every 7 (seven) days. Patient not taking: Reported on 12/10/2022 08/25/22  Yes Bedsole, Amy E, MD  augmented betamethasone dipropionate (DIPROLENE-AF) 0.05 % ointment Apply topically 2 (two) times daily as needed. 08/25/22   Pollyann Savoy, MD  cholecalciferol (VITAMIN D3) 25 MCG (1000 UNIT) tablet  Take 1,000 Units by mouth daily.    [provider]  cyclobenzaprine (FLEXERIL) 5 MG tablet TAKE 1 TABLET DAILY AS     NEEDED FOR MUSCLE SPASMS Patient not taking: Reported on 12/10/2022 10/07/22   Excell Seltzer, MD  fluconazole (DIFLUCAN) 100 MG tablet Take 1 tablet (100 mg total) by mouth daily. 12/10/22   Gearldine Bienenstock, PA-C  fluocinonide (LIDEX) 0.05 % external solution Apply 1 Application topically 2 (two) times daily as needed (PSORIASIS). 08/25/22   Pollyann Savoy, MD  nystatin (MYCOSTATIN) 100000 UNIT/ML suspension Take 5 mLs  (500,000 Units total) by mouth 4 (four) times daily. 12/09/22   Eulis Foster, FNP  senna-docusate (SENOKOT S) 8.6-50 MG tablet Take 1 tablet by mouth daily.    [provider]  TALTZ 80 MG/ML SOAJ INJECT 80MG  (1 PEN) UNDER THE SKIN EVERY 4 WEEKS Patient not taking: Reported on 12/07/2022 11/23/22   Gearldine Bienenstock, PA-C    Allergies as of 11/02/2022   (No Known Allergies)    Family History  Problem Relation Age of Onset   Cancer Father        colon   Hyperlipidemia Father    Hypertension Father    Diabetes Sister    Psoriasis Sister    Psoriasis Brother    Arthritis Brother    Osteoarthritis Brother    Diabetes Brother    Stroke Brother    Throat cancer Brother    Cancer Maternal Grandmother        colon   Hypertension Maternal Grandmother    Anuerysm Paternal Grandfather    Healthy Daughter    Healthy Son    Cancer Paternal Aunt        colon   Breast cancer Neg Hx     Social History   Socioeconomic History   Marital status: Married    Spouse name: Not on file   Number of children: 2   Years of education: Not on file   Highest education level: Not on file  Occupational History   Occupation: RN  Tobacco Use   Smoking status: Former    Current packs/day: 0.00    Average packs/day: 1 pack/day for 15.0 years (15.0 ttl pk-yrs)    Types: Cigarettes    Start date: 03/30/1977    Quit date: 03/30/1992    Years since quitting: 30.7    Passive exposure: Past   Smokeless tobacco: Never  Vaping Use   Vaping status: Never Used  Substance and Sexual Activity   Alcohol use: No   Drug use: No   Sexual activity: Not on file  Other Topics Concern   Not on file  Social History Narrative   Regular exercise-yes, but seasonal walks and swims   Diet: healthy, low carbohydrates, no caffeine   Social Determinants of Health   Financial Resource Strain: Low Risk  (08/20/2022)   Overall Financial Resource Strain (CARDIA)    Difficulty of Paying Living Expenses: Not hard at  all  Food Insecurity: No Food Insecurity (08/20/2022)   Hunger Vital Sign    Worried About Running Out of Food in the Last Year: Never true    Ran Out of Food in the Last Year: Never true  Transportation Needs: No Transportation Needs (08/20/2022)   PRAPARE - Administrator, Civil Service (Medical): No    Lack of Transportation (Non-Medical): No  Physical Activity: Insufficiently Active (08/20/2022)   Exercise Vital Sign    Days of Exercise per Week:  2 days    Minutes of Exercise per Session: 60 min  Stress: No Stress Concern Present (08/20/2022)   [employer] of Occupational Health - Occupational Stress Questionnaire    Feeling of Stress : Not at all  Social Connections: Socially Integrated (08/20/2022)   Social Connection and Isolation Panel [NHANES]    Frequency of Communication with Friends and Family: More than three times a week    Frequency of Social Gatherings with Friends and Family: More than three times a week    Attends Religious Services: More than 4 times per year    Active Member of [employer] or Organizations: Yes    Attends Banker Meetings: 1 to 4 times per year    Marital Status: Married  Catering manager Violence: Not At Risk (08/20/2022)   Humiliation, Afraid, Rape, and Kick questionnaire    Fear of Current or Ex-Partner: No    Emotionally Abused: No    Physically Abused: No    Sexually Abused: No    Review of Systems: See HPI, otherwise negative ROS  Physical Exam: BP 108/64   Temp (!) 97.2 F (36.2 C) (Temporal)   Resp 16   Ht 5' 7.99" (1.727 m)   Wt 86.2 kg   SpO2 100%   BMI 28.91 kg/m  General:   Alert, cooperative in NAD Head:  Normocephalic and atraumatic. Respiratory:  Normal work of breathing. Cardiovascular:  RRR  Impression/Plan: Jillian [name] is here for cataract surgery.  Risks, benefits, limitations, and alternatives regarding cataract surgery have been reviewed with the patient.  Questions have been answered.   All parties agreeable.   Galen Manila, MD  12/15/2022, 9:10 AM

## 2022-12-15 NOTE — Anesthesia Preprocedure Evaluation (Signed)
Anesthesia Evaluation  Patient identified by MRN, date of birth, ID band Patient awake    Reviewed: Allergy & Precautions, H&P , NPO status , Patient's Chart, lab work & pertinent test results  Airway Mallampati: II  TM Distance: >3 FB Neck ROM: Full    Dental no notable dental hx.    Pulmonary sleep apnea and Continuous Positive Airway Pressure Ventilation , former smoker   Pulmonary exam normal breath sounds clear to auscultation       Cardiovascular negative cardio ROS Normal cardiovascular exam Rhythm:Regular Rate:Normal     Neuro/Psych  PSYCHIATRIC DISORDERS Anxiety      Neuromuscular disease    GI/Hepatic negative GI ROS, Neg liver ROS,,,  Endo/Other  negative endocrine ROS    Renal/GU negative Renal ROS  negative genitourinary   Musculoskeletal  (+) Arthritis ,  Fibromyalgia -  Abdominal   Peds negative pediatric ROS (+)  Hematology negative hematology ROS (+)   Anesthesia Other Findings Medical History   Cancer (HCC) Sleep apnea on CPAP Fibromyalgia Primary localized osteoarthritis of right knee Dermatitis Anxiety UTI (urinary tract infection) Prediabetes Thrush     Reproductive/Obstetrics negative OB ROS                              Anesthesia Physical Anesthesia Plan  ASA: 3  Anesthesia Plan: MAC   Post-op Pain Management:    Induction: Intravenous  PONV Risk Score and Plan:   Airway Management Planned: Natural Airway and Nasal Cannula  Additional Equipment:   Intra-op Plan:   Post-operative Plan:   Informed Consent: I have reviewed the patients History and Physical, chart, labs and discussed the procedure including the risks, benefits and alternatives for the proposed anesthesia with the patient or authorized representative who has indicated his/her understanding and acceptance.     Dental Advisory Given  Plan Discussed with: Anesthesiologist, CRNA  and Surgeon  Anesthesia Plan Comments: (Patient consented for risks of anesthesia including but not limited to:  - adverse reactions to medications - damage to eyes, teeth, lips or other oral mucosa - nerve damage due to positioning  - sore throat or hoarseness - Damage to heart, brain, nerves, lungs, other parts of body or loss of life  Patient voiced understanding.)         Anesthesia Quick Evaluation

## 2022-12-16 ENCOUNTER — Encounter: Payer: Self-pay | Admitting: Ophthalmology

## 2022-12-18 NOTE — Anesthesia Preprocedure Evaluation (Addendum)
Anesthesia Evaluation  Patient identified by MRN, date of birth, ID band Patient awake    Reviewed: Allergy & Precautions, H&P , NPO status , Patient's Chart, lab work & pertinent test results  Airway Mallampati: II  TM Distance: >3 FB     Dental   Pulmonary sleep apnea , former smoker          Cardiovascular negative cardio ROS      Neuro/Psych  PSYCHIATRIC DISORDERS Anxiety      Neuromuscular disease negative neurological ROS  negative psych ROS   GI/Hepatic negative GI ROS, Neg liver ROS,,,  Endo/Other  negative endocrine ROS    Renal/GU negative Renal ROS  negative genitourinary   Musculoskeletal  (+) Arthritis ,  Fibromyalgia -  Abdominal   Peds negative pediatric ROS (+)  Hematology negative hematology ROS (+)   Anesthesia Other Findings Cancer  Sleep apnea Fibromyalgia  Primary localized osteoarthritis of right knee Dermatitis  Anxiety UTI (urinary tract infection)  Prediabetes Thrush   Previous cataract surgery 12-15-22  Reproductive/Obstetrics negative OB ROS                              Anesthesia Physical Anesthesia Plan  ASA: 3  Anesthesia Plan: MAC   Post-op Pain Management:    Induction: Intravenous  PONV Risk Score and Plan:   Airway Management Planned: Natural Airway and Nasal Cannula  Additional Equipment:   Intra-op Plan:   Post-operative Plan:   Informed Consent: I have reviewed the patients History and Physical, chart, labs and discussed the procedure including the risks, benefits and alternatives for the proposed anesthesia with the patient or authorized representative who has indicated his/her understanding and acceptance.     Dental Advisory Given  Plan Discussed with: Anesthesiologist, CRNA and Surgeon  Anesthesia Plan Comments: (Patient consented for risks of anesthesia including but not limited to:  - adverse reactions to  medications - damage to eyes, teeth, lips or other oral mucosa - nerve damage due to positioning  - sore throat or hoarseness - Damage to heart, brain, nerves, lungs, other parts of body or loss of life  Patient voiced understanding.)         Anesthesia Quick Evaluation

## 2022-12-22 DIAGNOSIS — G4733 Obstructive sleep apnea (adult) (pediatric): Secondary | ICD-10-CM | POA: Diagnosis not present

## 2022-12-23 NOTE — Discharge Instructions (Signed)

## 2022-12-28 DIAGNOSIS — M9904 Segmental and somatic dysfunction of sacral region: Secondary | ICD-10-CM | POA: Diagnosis not present

## 2022-12-28 DIAGNOSIS — M62830 Muscle spasm of back: Secondary | ICD-10-CM | POA: Diagnosis not present

## 2022-12-28 DIAGNOSIS — M5416 Radiculopathy, lumbar region: Secondary | ICD-10-CM | POA: Diagnosis not present

## 2022-12-28 DIAGNOSIS — M9903 Segmental and somatic dysfunction of lumbar region: Secondary | ICD-10-CM | POA: Diagnosis not present

## 2022-12-29 ENCOUNTER — Ambulatory Visit: Payer: Medicare HMO | Admitting: Anesthesiology

## 2022-12-29 ENCOUNTER — Ambulatory Visit
Admission: RE | Admit: 2022-12-29 | Discharge: 2022-12-29 | Disposition: A | Discharge status: Home / Self Care | Payer: Medicare HMO | Attending: Ophthalmology | Admitting: Ophthalmology

## 2022-12-29 ENCOUNTER — Encounter
Admission: RE | Disposition: A | Discharge status: Home / Self Care | Payer: Self-pay | Source: Home / Self Care | Attending: Ophthalmology

## 2022-12-29 ENCOUNTER — Other Ambulatory Visit: Payer: Self-pay

## 2022-12-29 ENCOUNTER — Encounter: Payer: Self-pay | Admitting: Ophthalmology

## 2022-12-29 DIAGNOSIS — F419 Anxiety disorder, unspecified: Secondary | ICD-10-CM | POA: Insufficient documentation

## 2022-12-29 DIAGNOSIS — Z833 Family history of diabetes mellitus: Secondary | ICD-10-CM | POA: Insufficient documentation

## 2022-12-29 DIAGNOSIS — H2511 Age-related nuclear cataract, right eye: Secondary | ICD-10-CM | POA: Insufficient documentation

## 2022-12-29 DIAGNOSIS — Z83438 Family history of other disorder of lipoprotein metabolism and other lipidemia: Secondary | ICD-10-CM | POA: Diagnosis not present

## 2022-12-29 DIAGNOSIS — Z87891 Personal history of nicotine dependence: Secondary | ICD-10-CM | POA: Insufficient documentation

## 2022-12-29 DIAGNOSIS — Z8261 Family history of arthritis: Secondary | ICD-10-CM | POA: Insufficient documentation

## 2022-12-29 DIAGNOSIS — Z87440 Personal history of urinary (tract) infections: Secondary | ICD-10-CM | POA: Insufficient documentation

## 2022-12-29 DIAGNOSIS — Z8249 Family history of ischemic heart disease and other diseases of the circulatory system: Secondary | ICD-10-CM | POA: Insufficient documentation

## 2022-12-29 DIAGNOSIS — R7303 Prediabetes: Secondary | ICD-10-CM | POA: Insufficient documentation

## 2022-12-29 DIAGNOSIS — M797 Fibromyalgia: Secondary | ICD-10-CM | POA: Diagnosis not present

## 2022-12-29 DIAGNOSIS — G4730 Sleep apnea, unspecified: Secondary | ICD-10-CM | POA: Diagnosis not present

## 2022-12-29 DIAGNOSIS — M1711 Unilateral primary osteoarthritis, right knee: Secondary | ICD-10-CM | POA: Diagnosis not present

## 2022-12-29 HISTORY — PX: CATARACT EXTRACTION W/PHACO: SHX586

## 2022-12-29 SURGERY — PHACOEMULSIFICATION, CATARACT, WITH IOL INSERTION
Anesthesia: Monitor Anesthesia Care | Laterality: Right

## 2022-12-29 MED ORDER — TETRACAINE HCL 0.5 % OP SOLN
1.0000 [drp] | OPHTHALMIC | Status: DC | PRN
  Administered 2022-12-29 (×3): 1 [drp] via OPHTHALMIC

## 2022-12-29 MED ORDER — TETRACAINE HCL 0.5 % OP SOLN
OPHTHALMIC | Status: AC
  Filled 2022-12-29: qty 4

## 2022-12-29 MED ORDER — MIDAZOLAM HCL 2 MG/2ML IJ SOLN
INTRAMUSCULAR | Status: DC | PRN
  Administered 2022-12-29: 2 mg via INTRAVENOUS

## 2022-12-29 MED ORDER — MOXIFLOXACIN HCL 0.5 % OP SOLN
OPHTHALMIC | Status: DC | PRN
  Administered 2022-12-29: .2 mL via OPHTHALMIC

## 2022-12-29 MED ORDER — LACTATED RINGERS IV SOLN: INTRAVENOUS | Status: DC

## 2022-12-29 MED ORDER — SIGHTPATH DOSE#1 BSS IO SOLN
INTRAOCULAR | Status: DC | PRN
  Administered 2022-12-29: 1 mL

## 2022-12-29 MED ORDER — SIGHTPATH DOSE#1 NA CHONDROIT SULF-NA HYALURON 40-17 MG/ML IO SOLN
INTRAOCULAR | Status: DC | PRN
  Administered 2022-12-29: 1 mL via INTRAOCULAR

## 2022-12-29 MED ORDER — BRIMONIDINE TARTRATE-TIMOLOL 0.2-0.5 % OP SOLN
OPHTHALMIC | Status: DC | PRN
  Administered 2022-12-29: 1 [drp] via OPHTHALMIC

## 2022-12-29 MED ORDER — SIGHTPATH DOSE#1 BSS IO SOLN
INTRAOCULAR | Status: DC | PRN
  Administered 2022-12-29: 49 mL via OPHTHALMIC

## 2022-12-29 MED ORDER — MIDAZOLAM HCL 2 MG/2ML IJ SOLN
INTRAMUSCULAR | Status: AC
  Filled 2022-12-29: qty 2

## 2022-12-29 MED ORDER — ARMC OPHTHALMIC DILATING DROPS
1.0000 | OPHTHALMIC | Status: DC | PRN
  Administered 2022-12-29 (×3): 1 via OPHTHALMIC

## 2022-12-29 MED ORDER — FENTANYL CITRATE (PF) 100 MCG/2ML IJ SOLN
INTRAMUSCULAR | Status: AC
  Filled 2022-12-29: qty 2

## 2022-12-29 MED ORDER — ARMC OPHTHALMIC DILATING DROPS
OPHTHALMIC | Status: AC
  Filled 2022-12-29: qty 0.5

## 2022-12-29 MED ORDER — FENTANYL CITRATE (PF) 100 MCG/2ML IJ SOLN
INTRAMUSCULAR | Status: DC | PRN
  Administered 2022-12-29: 100 ug via INTRAVENOUS

## 2022-12-29 MED ORDER — SIGHTPATH DOSE#1 BSS IO SOLN
INTRAOCULAR | Status: DC | PRN
  Administered 2022-12-29: 15 mL via INTRAOCULAR

## 2022-12-29 SURGICAL SUPPLY — 11 items
ANGLE REVERSE CUT SHRT 25GA (CUTTER) ×1
CATARACT SUITE SIGHTPATH (MISCELLANEOUS) ×1
CYSTOTOME ANGL RVRS SHRT 25G (CUTTER) ×2 IMPLANT
FEE CATARACT SUITE SIGHTPATH (MISCELLANEOUS) ×2 IMPLANT
GLOVE BIOGEL PI IND STRL 8 (GLOVE) ×2 IMPLANT
GLOVE SURG LX STRL 8.0 MICRO (GLOVE) ×2 IMPLANT
LENS CLAREON VIVITY IOL 28.0 ×1 IMPLANT
LENS IOL CLRN VT YLW 28.0 IMPLANT
NDL FILTER BLUNT 18X1 1/2 (NEEDLE) ×2 IMPLANT
NEEDLE FILTER BLUNT 18X1 1/2 (NEEDLE) ×1
SYR 3ML LL SCALE MARK (SYRINGE) ×2 IMPLANT

## 2022-12-29 NOTE — H&P (Signed)
[facility]   Primary Care Physician:  Excell Seltzer, MD Ophthalmologist: Dr. Maren Reamer  Pre-Procedure History & Physical: HPI:  Jillian [name] is a 67 y.o. adult here for cataract surgery.   Past Medical History:  Diagnosis Date   Anxiety    Cancer (HCC)    basal cell 2016   Dermatitis    per patient    Fibromyalgia    Prediabetes 12/03/2017   Primary localized osteoarthritis of right knee 02/07/2019   Sleep apnea    uses CPAP   Thrush    UTI (urinary tract infection)     Past Surgical History:  Procedure Laterality Date   BIOPSY  02/05/2021   Procedure: BIOPSY;  Surgeon: Quentin Ore, MD;  Location: [facility];  Service: General;;   CATARACT EXTRACTION W/PHACO Left 12/15/2022   Procedure: CATARACT EXTRACTION PHACO AND INTRAOCULAR LENS PLACEMENT (IOC) LEFT MALYUGIN  CLAREON VIVITY LENS 3.03 00:22.3;  Surgeon: Galen Manila, MD;  Location: [facility];  Service: Ophthalmology;  Laterality: Left;   CESAREAN SECTION     times 2   COLONOSCOPY WITH PROPOFOL N/A 10/21/2022   Procedure: COLONOSCOPY WITH PROPOFOL;  Surgeon: Toney Reil, MD;  Location: [facility];  Service: Gastroenterology;  Laterality: N/A;   ESOPHAGOGASTRODUODENOSCOPY N/A 02/05/2021   Procedure: ESOPHAGOGASTRODUODENOSCOPY (EGD);  Surgeon: Quentin Ore, MD;  Location: [facility];  Service: General;  Laterality: N/A;   EYE SURGERY Bilateral 04/24/2020   bilateral eyelids    GASTRIC BYPASS  06/03/2021   HERNIA REPAIR     PARTIAL KNEE ARTHROPLASTY Right 02/07/2019   Procedure: UNICOMPARTMENTAL KNEE;  Surgeon: Teryl Lucy, MD;  Location: [facility];  Service: Orthopedics;  Laterality: Right;   PARTIAL KNEE ARTHROPLASTY Left 03/21/2019   Procedure: UNICOMPARTMENTAL KNEE;  Surgeon: Teryl Lucy, MD;  Location: [facility];  Service: Orthopedics;  Laterality: Left;   UPPER GI ENDOSCOPY  03/2021   UPPER GI ENDOSCOPY N/A 06/03/2021   Procedure: UPPER GI ENDOSCOPY;   Surgeon: Quentin Ore, MD;  Location: [facility];  Service: General;  Laterality: N/A;    Prior to Admission medications   Medication Sig Start Date End Date Taking? Authorizing Provider  acetaminophen (TYLENOL) 500 MG tablet Take 500 mg by mouth every 6 (six) hours as needed.   Yes [provider]  augmented betamethasone dipropionate (DIPROLENE-AF) 0.05 % ointment Apply topically 2 (two) times daily as needed. 08/25/22  Yes Deveshwar, Janalyn Rouse, MD  buPROPion (WELLBUTRIN XL) 150 MG 24 hr tablet Take 1 tablet (150 mg total) by mouth daily. 07/07/22  Yes Bedsole, Amy E, MD  Calcium Carbonate (CALCIUM 500 PO) Take 1,000 mg by mouth daily.   Yes [provider]  cholecalciferol (VITAMIN D3) 25 MCG (1000 UNIT) tablet Take 1,000 Units by mouth daily.   Yes [provider]  DULoxetine (CYMBALTA) 30 MG capsule Take 3 capsules (90 mg total) by mouth daily. 07/07/22  Yes Bedsole, Amy E, MD  fluconazole (DIFLUCAN) 100 MG tablet Take 1 tablet (100 mg total) by mouth daily. 12/10/22  Yes Gearldine Bienenstock, PA-C  fluocinonide (LIDEX) 0.05 % external solution Apply 1 Application topically 2 (two) times daily as needed (PSORIASIS). 08/25/22  Yes Deveshwar, Janalyn Rouse, MD  ibuprofen (ADVIL) 200 MG tablet Take 200 mg by mouth every 6 (six) hours as needed.   Yes [provider]  Multiple Vitamins-Minerals (BARIATRIC MULTIVITAMINS/IRON PO) Take 1 capsule by mouth daily.   Yes [provider]  nystatin (MYCOSTATIN) 100000 UNIT/ML suspension Take 5  mLs (500,000 Units total) by mouth 4 (four) times daily. 12/09/22  Yes Worthy Rancher B, FNP  pregabalin (LYRICA) 150 MG capsule TAKE 1 CAPSULE TWICE DAILY 10/06/22  Yes Bedsole, Amy E, MD  senna-docusate (SENOKOT S) 8.6-50 MG tablet Take 1 tablet by mouth daily.   Yes [provider]  traMADol (ULTRAM) 50 MG tablet Take 1 tablet (50 mg total) by mouth every 8 (eight) hours as needed. 11/13/22  Yes Bedsole, Amy E, MD  traZODone  (DESYREL) 50 MG tablet Take 1 tablet (50 mg total) by mouth at bedtime as needed for sleep. 08/25/22  Yes Bedsole, Amy E, MD  cyclobenzaprine (FLEXERIL) 5 MG tablet TAKE 1 TABLET DAILY AS     NEEDED FOR MUSCLE SPASMS Patient not taking: Reported on 12/10/2022 10/07/22   Excell Seltzer, MD  TALTZ 80 MG/ML SOAJ INJECT 80MG  (1 PEN) UNDER THE SKIN EVERY 4 WEEKS Patient not taking: Reported on 12/07/2022 11/23/22   Gearldine Bienenstock, PA-C  Vitamin D, Ergocalciferol, (DRISDOL) 1.25 MG (50000 UNIT) CAPS capsule Take 1 capsule (50,000 Units total) by mouth every 7 (seven) days. Patient not taking: Reported on 12/10/2022 08/25/22   Excell Seltzer, MD    Allergies as of 11/02/2022   (No Known Allergies)    Family History  Problem Relation Age of Onset   Cancer Father        colon   Hyperlipidemia Father    Hypertension Father    Diabetes Sister    Psoriasis Sister    Psoriasis Brother    Arthritis Brother    Osteoarthritis Brother    Diabetes Brother    Stroke Brother    Throat cancer Brother    Cancer Maternal Grandmother        colon   Hypertension Maternal Grandmother    Anuerysm Paternal Grandfather    Healthy Daughter    Healthy Son    Cancer Paternal Aunt        colon   Breast cancer Neg Hx     Social History   Socioeconomic History   Marital status: Married    Spouse name: Not on file   Number of children: 2   Years of education: Not on file   Highest education level: Not on file  Occupational History   Occupation: RN  Tobacco Use   Smoking status: Former    Current packs/day: 0.00    Average packs/day: 1 pack/day for 15.0 years (15.0 ttl pk-yrs)    Types: Cigarettes    Start date: 03/30/1977    Quit date: 03/30/1992    Years since quitting: 30.7    Passive exposure: Past   Smokeless tobacco: Never  Vaping Use   Vaping status: Never Used  Substance and Sexual Activity   Alcohol use: No   Drug use: No   Sexual activity: Not on file  Other Topics Concern   Not on file   Social History Narrative   Regular exercise-yes, but seasonal walks and swims   Diet: healthy, low carbohydrates, no caffeine   Social Determinants of Health   Financial Resource Strain: Low Risk  (08/20/2022)   Overall Financial Resource Strain (CARDIA)    Difficulty of Paying Living Expenses: Not hard at all  Food Insecurity: No Food Insecurity (08/20/2022)   Hunger Vital Sign    Worried About Running Out of Food in the Last Year: Never true    Ran Out of Food in the Last Year: Never true  Transportation Needs: No Transportation  Needs (08/20/2022)   PRAPARE - Administrator, Civil Service (Medical): No    Lack of Transportation (Non-Medical): No  Physical Activity: Insufficiently Active (08/20/2022)   Exercise Vital Sign    Days of Exercise per Week: 2 days    Minutes of Exercise per Session: 60 min  Stress: No Stress Concern Present (08/20/2022)   [employer] of Occupational Health - Occupational Stress Questionnaire    Feeling of Stress : Not at all  Social Connections: Socially Integrated (08/20/2022)   Social Connection and Isolation Panel [NHANES]    Frequency of Communication with Friends and Family: More than three times a week    Frequency of Social Gatherings with Friends and Family: More than three times a week    Attends Religious Services: More than 4 times per year    Active Member of [employer] or Organizations: Yes    Attends Banker Meetings: 1 to 4 times per year    Marital Status: Married  Catering manager Violence: Not At Risk (08/20/2022)   Humiliation, Afraid, Rape, and Kick questionnaire    Fear of Current or Ex-Partner: No    Emotionally Abused: No    Physically Abused: No    Sexually Abused: No    Review of Systems: See HPI, otherwise negative ROS  Physical Exam: BP 111/72   Pulse 77   Temp 97.9 F (36.6 C) (Temporal)   Resp 18   Ht 5\' 8"  (1.727 m)   Wt 86.6 kg   SpO2 (!) 77%   BMI 29.04 kg/m  General:   Alert,  cooperative in NAD Head:  Normocephalic and atraumatic. Respiratory:  Normal work of breathing. Cardiovascular:  RRR  Impression/Plan: Jillian [name] is here for cataract surgery.  Risks, benefits, limitations, and alternatives regarding cataract surgery have been reviewed with the patient.  Questions have been answered.  All parties agreeable.   Galen Manila, MD  12/29/2022, 11:38 AM

## 2022-12-29 NOTE — Transfer of Care (Signed)
Immediate Anesthesia Transfer of Care Note  Patient: Jillian [name]  Procedure(s) Performed: CATARACT EXTRACTION PHACO AND INTRAOCULAR LENS PLACEMENT (IOC) RIGHT MALYUGIN  CLAREON VIVITY LENS 3.15 00:21.2 (Right)  Patient Location: PACU  Anesthesia Type: MAC  Level of Consciousness: awake, alert  and patient cooperative  Airway and Oxygen Therapy: Patient Spontanous Breathing and Patient connected to supplemental oxygen  Post-op Assessment: Post-op Vital signs reviewed, Patient's Cardiovascular Status Stable, Respiratory Function Stable, Patent Airway and No signs of Nausea or vomiting  Post-op Vital Signs: Reviewed and stable  Complications: No notable events documented.

## 2022-12-29 NOTE — Op Note (Signed)
PREOPERATIVE DIAGNOSIS:  Nuclear sclerotic cataract of the right eye.   POSTOPERATIVE DIAGNOSIS:  Cataract   OPERATIVE PROCEDURE:ORPROCALL@   SURGEON:  Galen Manila, MD.   ANESTHESIA:  Anesthesiologist: Marisue Humble, MD CRNA: Domenic Moras, CRNA  1.      Managed anesthesia care. 2.      0.24ml of Shugarcaine was instilled in the eye following the paracentesis.   COMPLICATIONS:  None.   TECHNIQUE:   Stop and chop   DESCRIPTION OF PROCEDURE:  The patient was examined and consented in the preoperative holding area where the aforementioned topical anesthesia was applied to the right eye and then brought back to the Operating Room where the right eye was prepped and draped in the usual sterile ophthalmic fashion and a lid speculum was placed. A paracentesis was created with the side port blade and the anterior chamber was filled with viscoelastic. A near clear corneal incision was performed with the steel keratome. A continuous curvilinear capsulorrhexis was performed with a cystotome followed by the capsulorrhexis forceps. Hydrodissection and hydrodelineation were carried out with BSS on a blunt cannula. The lens was removed in a stop and chop  technique and the remaining cortical material was removed with the irrigation-aspiration handpiece. The capsular bag was inflated with viscoelastic and the Technis [id number]  lens was placed in the capsular bag without complication. The remaining viscoelastic was removed from the eye with the irrigation-aspiration handpiece. The wounds were hydrated. The anterior chamber was flushed with BSS and the eye was inflated to physiologic pressure. 0.58ml of Vigamox was placed in the anterior chamber. The wounds were found to be water tight. The eye was dressed with Combigan. The patient was given protective glasses to wear throughout the day and a shield with which to sleep tonight. The patient was also given drops with which to begin a drop regimen today and  will follow-up with me in one day. Implant Name Type Inv. Item Serial No. Manufacturer Lot No. LRB No. Used Action  LENS CLAREON VIVITY IOL 28.0 - [id number]  LENS CLAREON VIVITY IOL 28.0 [id number] [employer]  Right 1 Implanted   Procedure(s): CATARACT EXTRACTION PHACO AND INTRAOCULAR LENS PLACEMENT (IOC) RIGHT MALYUGIN  CLAREON VIVITY LENS 3.15 00:21.2 (Right)  Electronically signed: Galen Manila 12/29/2022 12:04 PM

## 2023-01-01 NOTE — Anesthesia Postprocedure Evaluation (Signed)
Anesthesia Post Note  Patient: ROSELL KHOURI  Procedure(s) Performed: CATARACT EXTRACTION PHACO AND INTRAOCULAR LENS PLACEMENT (IOC) RIGHT MALYUGIN  CLAREON VIVITY LENS 3.15 00:21.2 (Right)  Patient location during evaluation: PACU Anesthesia Type: MAC Level of consciousness: awake and alert Pain management: pain level controlled Vital Signs Assessment: post-procedure vital signs reviewed and stable Respiratory status: spontaneous breathing, nonlabored ventilation, respiratory function stable and patient connected to nasal cannula oxygen Cardiovascular status: blood pressure returned to baseline and stable Postop Assessment: no apparent nausea or vomiting Anesthetic complications: no   No notable events documented.   Last Vitals:  Vitals:   12/29/22 1206 12/29/22 1213  BP: 114/70   Pulse: 90   Resp: 20   Temp: 36.6 C (!) 36.2 C  SpO2: 94%     Last Pain:  Vitals:   12/29/22 1213  TempSrc:   PainSc: 0-No pain                 Malicia Blasdel C Shanica Castellanos

## 2023-01-07 ENCOUNTER — Encounter (HOSPITAL_COMMUNITY): Payer: Self-pay | Admitting: *Deleted

## 2023-01-11 ENCOUNTER — Other Ambulatory Visit: Payer: Self-pay | Admitting: Family Medicine

## 2023-01-12 NOTE — Telephone Encounter (Signed)
Last office visit 11/12/22 for oral candida.  Last refilled 10/06/22 for #200 with no refills.  Next Appt: No future appointments.

## 2023-01-21 ENCOUNTER — Other Ambulatory Visit: Payer: Self-pay | Admitting: Family Medicine

## 2023-01-21 DIAGNOSIS — G4733 Obstructive sleep apnea (adult) (pediatric): Secondary | ICD-10-CM | POA: Diagnosis not present

## 2023-01-21 NOTE — Telephone Encounter (Signed)
Last office visit 11/12/2022 for Oral Candida (virtual).  Last refilled 11/13/2022 for #180 with no refills. Next Appt: No future appointments with PCP.

## 2023-01-25 DIAGNOSIS — M9903 Segmental and somatic dysfunction of lumbar region: Secondary | ICD-10-CM | POA: Diagnosis not present

## 2023-01-25 DIAGNOSIS — M62830 Muscle spasm of back: Secondary | ICD-10-CM | POA: Diagnosis not present

## 2023-01-25 DIAGNOSIS — M5416 Radiculopathy, lumbar region: Secondary | ICD-10-CM | POA: Diagnosis not present

## 2023-01-25 DIAGNOSIS — M9904 Segmental and somatic dysfunction of sacral region: Secondary | ICD-10-CM | POA: Diagnosis not present

## 2023-02-15 ENCOUNTER — Other Ambulatory Visit: Payer: Self-pay | Admitting: Medical Genetics

## 2023-02-15 DIAGNOSIS — Z006 Encounter for examination for normal comparison and control in clinical research program: Secondary | ICD-10-CM

## 2023-02-21 DIAGNOSIS — G4733 Obstructive sleep apnea (adult) (pediatric): Secondary | ICD-10-CM | POA: Diagnosis not present

## 2023-02-22 DIAGNOSIS — M9904 Segmental and somatic dysfunction of sacral region: Secondary | ICD-10-CM | POA: Diagnosis not present

## 2023-02-22 DIAGNOSIS — M62830 Muscle spasm of back: Secondary | ICD-10-CM | POA: Diagnosis not present

## 2023-02-22 DIAGNOSIS — M5416 Radiculopathy, lumbar region: Secondary | ICD-10-CM | POA: Diagnosis not present

## 2023-02-22 DIAGNOSIS — M9903 Segmental and somatic dysfunction of lumbar region: Secondary | ICD-10-CM | POA: Diagnosis not present

## 2023-02-26 ENCOUNTER — Other Ambulatory Visit
Admission: RE | Admit: 2023-02-26 | Discharge: 2023-02-26 | Disposition: A | Discharge status: Home / Self Care | Payer: Medicare HMO | Source: Ambulatory Visit | Attending: Medical Genetics | Admitting: Medical Genetics

## 2023-02-26 DIAGNOSIS — Z006 Encounter for examination for normal comparison and control in clinical research program: Secondary | ICD-10-CM | POA: Insufficient documentation

## 2023-03-01 ENCOUNTER — Other Ambulatory Visit: Payer: Self-pay | Admitting: *Deleted

## 2023-03-01 DIAGNOSIS — Z79899 Other long term (current) drug therapy: Secondary | ICD-10-CM

## 2023-03-01 DIAGNOSIS — L409 Psoriasis, unspecified: Secondary | ICD-10-CM

## 2023-03-01 DIAGNOSIS — L4050 Arthropathic psoriasis, unspecified: Secondary | ICD-10-CM

## 2023-03-01 MED ORDER — TALTZ 80 MG/ML ~~LOC~~ SOAJ
SUBCUTANEOUS | 0 refills | Status: DC
Start: 2023-03-01 — End: 2023-06-07

## 2023-03-01 NOTE — Telephone Encounter (Signed)
Last Fill: 11/23/2022  Labs: 12/10/2022 CBC and CMP WNL   TB Gold: 07/13/2022 Neg    Next Visit: 03/16/2023  Last Visit: 12/10/2022  [id number] arthritis   Current Dose per office note 12/10/2022: Taltz 80 mg sq injections every 4 weeks   Patient to update labs at upcoming appointment on 03/16/2023  Okay to refill Taltz?

## 2023-03-02 NOTE — Progress Notes (Signed)
Office Visit Note  Patient: Jillian [name]             Date of Birth: [birth year]           MRN: [id number]             PCP: Excell Seltzer, MD Referring: Excell Seltzer, MD Visit Date: 03/16/2023 Occupation: @GUAROCC @  Subjective:  Fatigue   History of Present Illness: NEESA KNAPIK is a 67 y.o. adult with history of psoriatic arthritis and osteoarthritis. Patient remains on  Taltz 80 mg sq injections every 4 weeks. Patient initiated Altamease Oiler on 07/02/2021.  She is tolerating Taltz without any side effects or injection site reactions.  Overall she has noticed a 95% improvement in her symptoms while taking Taltz.  She denies any Achilles tendinitis or plantar fasciitis.  She denies any SI joint pain currently.  She denies any joint inflammation at this time.  Patient states that the psoriasis on her palms has been improving.  She has a few small patches of psoriasis on her scalp and uses flucinonide solution and requested a refill today. Patient continues to experience intermittent bouts of fatigue and myofascial pain which she attributes to fibromyalgia.  She has been going to [employer] classes for exercise which she has been enjoying. She denies any recent or recurrent infections.  She denies any new medical conditions.    Activities of Daily Living:  Patient reports morning stiffness for 2 hours.   Patient Denies nocturnal pain.  Difficulty dressing/grooming: Denies Difficulty climbing stairs: Denies Difficulty getting out of chair: Denies Difficulty using hands for taps, buttons, cutlery, and/or writing: Denies  Review of Systems  Constitutional:  Positive for fatigue.  HENT:  Positive for mouth dryness. Negative for mouth sores.   Eyes:  Positive for dryness.  Respiratory:  Negative for shortness of breath.   Cardiovascular:  Negative for chest pain and palpitations.  Gastrointestinal:  Positive for constipation. Negative for blood in stool and diarrhea.  Endocrine:  Negative for increased urination.  Genitourinary:  Negative for involuntary urination.  Musculoskeletal:  Positive for joint pain, gait problem, joint pain, myalgias, morning stiffness, muscle tenderness and myalgias. Negative for joint swelling and muscle weakness.  Skin:  Positive for rash and hair loss. Negative for color change and sensitivity to sunlight.  Allergic/Immunologic: Negative for susceptible to infections.  Neurological:  Negative for dizziness and headaches.  Hematological:  Negative for swollen glands.  Psychiatric/Behavioral:  Positive for sleep disturbance. Negative for depressed mood. The patient is not nervous/anxious.     PMFS History:  Patient Active Problem List   Diagnosis Date Noted   Oral candida 11/12/2022   Family history of colon cancer 10/21/2022   Screening for colon cancer 10/21/2022   Psoriatic arthritis (HCC) 05/07/2021   Psoriasis 05/07/2021   Primary osteoarthritis of both hands 05/07/2021   Primary osteoarthritis of both feet 05/07/2021   Pitting of nails 01/23/2020   OSA (obstructive sleep apnea) 04/12/2019   S/P left unicompartmental knee replacement 03/21/2019   Osteoarthritis of left knee 03/09/2019   Primary localized osteoarthritis of right knee 02/07/2019   Status post right partial knee replacement 02/07/2019   Prediabetes 12/03/2017   Non-restorative sleep 02/27/2016   Vitamin D deficiency 10/28/2015   Generalized anxiety disorder 11/25/2012   HYPERTRIGLYCERIDEMIA 12/20/2008   MENOPAUSE, EARLY 10/04/2008   Fibromyalgia 10/04/2008    Past Medical History:  Diagnosis Date   Anxiety    Cancer (HCC)    basal cell  2016   Dermatitis    per patient    Fibromyalgia    Prediabetes 12/03/2017   Primary localized osteoarthritis of right knee 02/07/2019   Sleep apnea    uses CPAP   Thrush    UTI (urinary tract infection)     Family History  Problem Relation Age of Onset   Cancer Father        colon   Hyperlipidemia Father     Hypertension Father    Diabetes Sister    Psoriasis Sister    Psoriasis Brother    Arthritis Brother    Osteoarthritis Brother    Diabetes Brother    Stroke Brother    Throat cancer Brother    Cancer Maternal Grandmother        colon   Hypertension Maternal Grandmother    Anuerysm Paternal Grandfather    Healthy Daughter    Healthy Son    Cancer Paternal Aunt        colon   Breast cancer Neg Hx    Past Surgical History:  Procedure Laterality Date   BIOPSY  02/05/2021   Procedure: BIOPSY;  Surgeon: Stechschulte, Hyman Hopes, MD;  Location: [facility];  Service: General;;   CATARACT EXTRACTION W/PHACO Left 12/15/2022   Procedure: CATARACT EXTRACTION PHACO AND INTRAOCULAR LENS PLACEMENT (IOC) LEFT MALYUGIN  CLAREON VIVITY LENS 3.03 00:22.3;  Surgeon: Galen Manila, MD;  Location: [facility];  Service: Ophthalmology;  Laterality: Left;   CATARACT EXTRACTION W/PHACO Right 12/29/2022   Procedure: CATARACT EXTRACTION PHACO AND INTRAOCULAR LENS PLACEMENT (IOC) RIGHT MALYUGIN  CLAREON VIVITY LENS 3.15 00:21.2;  Surgeon: Galen Manila, MD;  Location: [facility];  Service: Ophthalmology;  Laterality: Right;   CESAREAN SECTION     times 2   COLONOSCOPY WITH PROPOFOL N/A 10/21/2022   Procedure: COLONOSCOPY WITH PROPOFOL;  Surgeon: Toney Reil, MD;  Location: [facility];  Service: Gastroenterology;  Laterality: N/A;   ESOPHAGOGASTRODUODENOSCOPY N/A 02/05/2021   Procedure: ESOPHAGOGASTRODUODENOSCOPY (EGD);  Surgeon: Quentin Ore, MD;  Location: [facility];  Service: General;  Laterality: N/A;   EYE SURGERY Bilateral 04/24/2020   bilateral eyelids    GASTRIC BYPASS  06/03/2021   HERNIA REPAIR     PARTIAL KNEE ARTHROPLASTY Right 02/07/2019   Procedure: UNICOMPARTMENTAL KNEE;  Surgeon: Teryl Lucy, MD;  Location: [facility];  Service: Orthopedics;  Laterality: Right;   PARTIAL KNEE ARTHROPLASTY Left 03/21/2019   Procedure: UNICOMPARTMENTAL KNEE;   Surgeon: Teryl Lucy, MD;  Location: [facility];  Service: Orthopedics;  Laterality: Left;   UPPER GI ENDOSCOPY  03/2021   UPPER GI ENDOSCOPY N/A 06/03/2021   Procedure: UPPER GI ENDOSCOPY;  Surgeon: Quentin Ore, MD;  Location: [facility];  Service: General;  Laterality: N/A;   Social History   Social History Narrative   Regular exercise-yes, but seasonal walks and swims   Diet: healthy, low carbohydrates, no caffeine   Immunization History  Administered Date(s) Administered   Influenza-Unspecified 01/01/2020, 12/28/2020, 12/28/2021   Moderna Sars-Covid-2 Vaccination 05/23/2019, 07/04/2019   PNEUMOCOCCAL CONJUGATE-20 08/16/2020   Td 03/30/2004   Tdap 10/28/2015   Zoster Recombinant(Shingrix) 07/09/2021, 08/03/2022   Zoster, Live 08/30/2014     Objective: Vital Signs: BP 106/68 (BP Location: Right Arm, Patient Position: Sitting, Cuff Size: Normal)   Pulse 81   Resp 15   Ht 5\' 8"  (1.727 m)   Wt 192 lb (87.1 kg)   BMI 29.19 kg/m    Physical Exam Vitals and nursing note reviewed.  Constitutional:  Appearance: She is well-developed.  HENT:     Head: Normocephalic and atraumatic.  Eyes:     Conjunctiva/sclera: Conjunctivae normal.     Pupils: Pupils are equal, round, and reactive to light.  Cardiovascular:     Rate and Rhythm: Normal rate and regular rhythm.     Heart sounds: Normal heart sounds.  Pulmonary:     Effort: Pulmonary effort is normal.     Breath sounds: Normal breath sounds.  Abdominal:     General: Bowel sounds are normal.     Palpations: Abdomen is soft.  Musculoskeletal:     Cervical back: Normal range of motion and neck supple.  Skin:    General: Skin is warm and dry.     Capillary Refill: Capillary refill takes less than 2 seconds.  Neurological:     Mental Status: She is alert and oriented to person, place, and time.  Psychiatric:        Behavior: Behavior normal.      Musculoskeletal Exam: C-spine has good ROM.  No midline spinal  tenderness.  No SI joint tenderness.  Generalized hyperalgesia and positive tender points noted on examination today.  C-spine has good range of motion.  Some tenderness over the deltoid insertion site bilaterally.  Elbow joints, wrist joints, MCPs, PIPs, DIPs have good range of motion with no synovitis.  PIP and DIP thickening noted.  Hip joints have good range of motion with no groin pain.  Partial knee replacements have good range of motion with some tenderness along the patella tendon of the left knee.  Ankle joints have good range of motion no tenderness or joint swelling.  No evidence of Achilles tendinitis.  CDAI Exam: CDAI Score: -- Patient Global: --; Provider Global: -- Swollen: --; Tender: -- Joint Exam 03/16/2023   No joint exam has been documented for this visit   There is currently no information documented on the homunculus. Go to the Rheumatology activity and complete the homunculus joint exam.  Investigation: No additional findings.  Imaging: No results found.  Recent Labs: Lab Results  Component Value Date   WBC 6.8 12/10/2022   HGB 12.6 12/10/2022   PLT 201 12/10/2022   NA 137 12/10/2022   K 4.8 12/10/2022   CL 104 12/10/2022   CO2 29 12/10/2022   GLUCOSE 87 12/10/2022   BUN 17 12/10/2022   CREATININE 0.95 12/10/2022   BILITOT 0.5 12/10/2022   ALKPHOS 76 09/19/2021   AST 19 12/10/2022   ALT 20 12/10/2022   PROT 6.4 12/10/2022   ALBUMIN 4.3 09/19/2021   CALCIUM 10.0 12/10/2022   GFRAA 83 07/16/2020   QFTBGOLDPLUS NEGATIVE 07/13/2022    Speciality Comments: tremfya -started September 16, 2020; Taltz started 07/02/21  Procedures:  No procedures performed Allergies: Patient has no known allergies.   Assessment / Plan:     Visit Diagnoses: Psoriatic arthritis (HCC) - inflammatory arthritis, psoriasis, plantar fasciitis, fingernail pitting, RF-, HLA-B27-, chronic SI joint pain, family hx of psoriasis and psoriatic arthritis: She has no synovitis or dactylitis on  examination today.  No SI joint tenderness upon palpation.  No evidence of Achilles tendinitis or plantar fasciitis.  Her psoriasis has improved significantly since initiating Taltz.  Overall she has noticed a 95% improvement while on Taltz.  Patient will remain on Taltz 80 mg subcu days injections every 4 weeks.  She was advised to notify us if she develops any signs or symptoms of a flare.  She will follow-up in the office in  5 months or sooner if needed.  Psoriasis - Diagnosed by Dr. Harlon Flor.  Palmar psoriasis has cleared since initiating Taltz.  She has a few patches of psoriasis on her scalp and requested a refill of fluocinonide external solution.    High risk medication use - Taltz 80 mg sq injections every 4 weeks. Patient initiated Altamease Oiler on 07/02/2021. Previous therapy includes: Tremfya. - CBC and CMP updated on 12/10/22. Orders for CBC and CMP updated today.  Her next lab work will be due in March and every 3 months to monitor for drug toxicity. TB gold negative on 07/13/22.  No recurrent infections. Discussed the importance of holding taltz if she develops signs or symptoms of an infection and to resume once the infection has completely cleared.  Plan: CBC with Differential/Platelet, COMPLETE METABOLIC PANEL WITH GFR  Pitting of nails: Improving  Primary osteoarthritis of both hands: She has PIP and DIP thickening consistent with osteoarthritis of both hands.  No active synovitis or dactylitis noted.  Status post right partial knee replacement - Dr. Rogelio Seen on 02/07/2019.  S/P left unicompartmental knee replacement - - Dr. Rogelio Seen on 03/21/2019.  He has been experiencing some increased patella tendinitis which she attributes to kneeling while playing.  Primary osteoarthritis of both feet: Range of motion of both ankle joints with no tenderness or joint swelling.  No evidence of Achilles tendinitis or plantar fasciitis.  Chronic SI joint pain: No SI joint tenderness  upon palpation today.  Family history of psoriatic arthritis - Brother and sister  Fibromyalgia - Patient presents today experiencing increased myofascial pain and fatigue.  She feels as though she is starting to have a fibromyalgia flare.  Patient has been trying to remain active going to [employer] sne[employer] exercise classes which she has enjoyed.  She has been taking tramadol as needed for pain relief and remains on Lyrica and Cymbalta as prescribed.  She continues to take trazodone at bedtime for insomnia and has a prescription for Flexeril 5 mg 1 tablet daily as needed for muscle spasms.  Discussed the importance of regular exercise and good sleep hygiene.  Vitamin D deficiency: She is taking vitamin D 1000 units daily.  Osteopenia of multiple sites - DEXA scan on January 29, 2021 DEXA scan showed T score of -1.1 in the left femoral neck.  She remains on a calcium and vitamin D supplement daily.  Due to update DEXA November 2024  Other medical conditions are listed as follows:   Oral thrush: No recurrence.   Non-restorative sleep: She is taking trazodone 50 mg 1 tablet at bedtime for insomnia.  Prediabetes  OSA (obstructive sleep apnea)  Generalized anxiety disorder  Status post gastric bypass for obesity  Orders: Orders Placed This Encounter  Procedures   CBC with Differential/Platelet   COMPLETE METABOLIC PANEL WITH GFR   No orders of the defined types were placed in this encounter.    Follow-Up Instructions: Return in about 5 months (around 08/14/2023) for Psoriatic arthritis, Osteoarthritis.   Gearldine Bienenstock, PA-C  Note - This record has been created using Dragon software.  Chart creation errors have been sought, but may not always  have been located. Such creation errors do not reflect on  the standard of medical care.

## 2023-03-12 LAB — GENECONNECT MOLECULAR SCREEN: Genetic Analysis Overall Interpretation: NEGATIVE

## 2023-03-16 ENCOUNTER — Encounter: Payer: Self-pay | Admitting: Physician Assistant

## 2023-03-16 ENCOUNTER — Ambulatory Visit: Payer: Medicare HMO | Attending: Physician Assistant | Admitting: Physician Assistant

## 2023-03-16 VITALS — BP 106/68 | HR 81 | Resp 15 | Ht 68.0 in | Wt 192.0 lb

## 2023-03-16 DIAGNOSIS — Z9884 Bariatric surgery status: Secondary | ICD-10-CM

## 2023-03-16 DIAGNOSIS — M533 Sacrococcygeal disorders, not elsewhere classified: Secondary | ICD-10-CM

## 2023-03-16 DIAGNOSIS — R7303 Prediabetes: Secondary | ICD-10-CM

## 2023-03-16 DIAGNOSIS — L4050 Arthropathic psoriasis, unspecified: Secondary | ICD-10-CM

## 2023-03-16 DIAGNOSIS — Z96652 Presence of left artificial knee joint: Secondary | ICD-10-CM | POA: Diagnosis not present

## 2023-03-16 DIAGNOSIS — M19071 Primary osteoarthritis, right ankle and foot: Secondary | ICD-10-CM | POA: Diagnosis not present

## 2023-03-16 DIAGNOSIS — L409 Psoriasis, unspecified: Secondary | ICD-10-CM | POA: Diagnosis not present

## 2023-03-16 DIAGNOSIS — L608 Other nail disorders: Secondary | ICD-10-CM | POA: Diagnosis not present

## 2023-03-16 DIAGNOSIS — Z96651 Presence of right artificial knee joint: Secondary | ICD-10-CM | POA: Diagnosis not present

## 2023-03-16 DIAGNOSIS — G8929 Other chronic pain: Secondary | ICD-10-CM

## 2023-03-16 DIAGNOSIS — E559 Vitamin D deficiency, unspecified: Secondary | ICD-10-CM

## 2023-03-16 DIAGNOSIS — Z79899 Other long term (current) drug therapy: Secondary | ICD-10-CM | POA: Diagnosis not present

## 2023-03-16 DIAGNOSIS — M797 Fibromyalgia: Secondary | ICD-10-CM

## 2023-03-16 DIAGNOSIS — M19072 Primary osteoarthritis, left ankle and foot: Secondary | ICD-10-CM

## 2023-03-16 DIAGNOSIS — Z840 Family history of diseases of the skin and subcutaneous tissue: Secondary | ICD-10-CM | POA: Diagnosis not present

## 2023-03-16 DIAGNOSIS — M19042 Primary osteoarthritis, left hand: Secondary | ICD-10-CM

## 2023-03-16 DIAGNOSIS — G4733 Obstructive sleep apnea (adult) (pediatric): Secondary | ICD-10-CM

## 2023-03-16 DIAGNOSIS — M8589 Other specified disorders of bone density and structure, multiple sites: Secondary | ICD-10-CM

## 2023-03-16 DIAGNOSIS — F411 Generalized anxiety disorder: Secondary | ICD-10-CM

## 2023-03-16 DIAGNOSIS — G478 Other sleep disorders: Secondary | ICD-10-CM

## 2023-03-16 DIAGNOSIS — M19041 Primary osteoarthritis, right hand: Secondary | ICD-10-CM | POA: Diagnosis not present

## 2023-03-16 DIAGNOSIS — B370 Candidal stomatitis: Secondary | ICD-10-CM

## 2023-03-16 MED ORDER — FLUOCINONIDE 0.05 % EX SOLN: 1.0000 | Freq: Two times a day (BID) | CUTANEOUS | 2 refills | Status: AC | PRN

## 2023-03-17 LAB — CBC WITH DIFFERENTIAL/PLATELET
Absolute Lymphocytes: 2712 {cells}/uL (ref 850–3900)
Absolute Monocytes: 676 {cells}/uL (ref 200–950)
Basophils Absolute: 48 {cells}/uL (ref 0–200)
Basophils Relative: 0.7 %
Eosinophils Absolute: 193 {cells}/uL (ref 15–500)
Eosinophils Relative: 2.8 %
HCT: 37.9 % (ref 35.0–45.0)
Hemoglobin: 12.8 g/dL (ref 11.7–15.5)
MCH: 31.5 pg (ref 27.0–33.0)
MCHC: 33.8 g/dL (ref 32.0–36.0)
MCV: 93.3 fL (ref 80.0–100.0)
MPV: 11.3 fL (ref 7.5–12.5)
Monocytes Relative: 9.8 %
Neutro Abs: 3271 {cells}/uL (ref 1500–7800)
Neutrophils Relative %: 47.4 %
Platelets: 214 10*3/uL (ref 140–400)
RBC: 4.06 10*6/uL (ref 3.80–5.10)
RDW: 11.8 % (ref 11.0–15.0)
Total Lymphocyte: 39.3 %
WBC: 6.9 10*3/uL (ref 3.8–10.8)

## 2023-03-17 LAB — COMPLETE METABOLIC PANEL WITH GFR
AG Ratio: 2.4 (calc) (ref 1.0–2.5)
ALT: 23 U/L (ref 6–29)
AST: 20 U/L (ref 10–35)
Albumin: 4.5 g/dL (ref 3.6–5.1)
Alkaline phosphatase (APISO): 86 U/L (ref 37–153)
BUN: 16 mg/dL (ref 7–25)
CO2: 28 mmol/L (ref 20–32)
Calcium: 9.8 mg/dL (ref 8.6–10.4)
Chloride: 105 mmol/L (ref 98–110)
Creat: 0.81 mg/dL (ref 0.50–1.05)
Globulin: 1.9 g/dL (ref 1.9–3.7)
Glucose, Bld: 79 mg/dL (ref 65–99)
Potassium: 4.2 mmol/L (ref 3.5–5.3)
Sodium: 139 mmol/L (ref 135–146)
Total Bilirubin: 0.4 mg/dL (ref 0.2–1.2)
Total Protein: 6.4 g/dL (ref 6.1–8.1)
eGFR: 79 mL/min/{1.73_m2} (ref >=60)

## 2023-03-17 NOTE — Progress Notes (Signed)
CBC and CMP WNL

## 2023-03-20 ENCOUNTER — Other Ambulatory Visit: Payer: Self-pay | Admitting: Family Medicine

## 2023-03-22 ENCOUNTER — Other Ambulatory Visit: Payer: Self-pay | Admitting: Family Medicine

## 2023-03-22 DIAGNOSIS — M9903 Segmental and somatic dysfunction of lumbar region: Secondary | ICD-10-CM | POA: Diagnosis not present

## 2023-03-22 DIAGNOSIS — M5416 Radiculopathy, lumbar region: Secondary | ICD-10-CM | POA: Diagnosis not present

## 2023-03-22 DIAGNOSIS — M9904 Segmental and somatic dysfunction of sacral region: Secondary | ICD-10-CM | POA: Diagnosis not present

## 2023-03-22 DIAGNOSIS — M62830 Muscle spasm of back: Secondary | ICD-10-CM | POA: Diagnosis not present

## 2023-03-22 MED ORDER — DULOXETINE HCL 30 MG PO CPEP: 90.0000 mg | ORAL_CAPSULE | Freq: Every day | ORAL | 1 refills | Status: DC

## 2023-03-22 NOTE — Telephone Encounter (Signed)
Last office visit 11/12/2022 (video) for oral candida.  Last refilled 01/21/2023 for #180 with no refills.  Next Appt: No future appointments with PCP.

## 2023-03-23 ENCOUNTER — Other Ambulatory Visit: Payer: Self-pay | Admitting: *Deleted

## 2023-03-23 DIAGNOSIS — G4733 Obstructive sleep apnea (adult) (pediatric): Secondary | ICD-10-CM | POA: Diagnosis not present

## 2023-03-23 MED ORDER — TRAZODONE HCL 50 MG PO TABS: 50.0000 mg | ORAL_TABLET | Freq: Every evening | ORAL | 1 refills | Status: DC | PRN

## 2023-03-23 NOTE — Telephone Encounter (Signed)
Copied from CRM [id number]. Topic: Clinical - Medication Refill >> Mar 23, 2023 10:03 AM Clayton Bibles wrote: Most Recent Primary Care Visit:  Provider: Kerby Nora E  Department: [facility]  Visit Type: MYCHART VIDEO VISIT  Date: 11/12/2022  Medication: traZODone (DESYREL) 50 MG tablet  Has the patient contacted their pharmacy? Yes (Agent: If no, request that the patient contact the pharmacy for the refill. If patient does not wish to contact the pharmacy document the reason why and proceed with request.) (Agent: If yes, when and what did the pharmacy advise?)  Is this the correct [facility] this prescription? [facility] [place] [place] If no, delete pharmacy and type the correct one.  This is the patient's preferred pharmacy:  [facility] [place], [place] - [address] Portal to Registered [employer] Sites [address] [place] [address] Phone: [phone] Fax: [phone]  [id number] [place], [place] - [address] [place] [address] Phone: [phone] Fax: [phone]  [facility] - [place], [place] - [address] [place] [address] Phone: [phone] Fax: [phone]   Has the prescription been filled recently? Yes on 01/21/23 for a 2 month supply  Is the patient out of the medication? Yes - She pick up this medication on 01/29/23 and now pharmacy will not fill until 03/27/23 because she pick it up late. She needs an order for 9 pills to help her get through until 03/27/23 today. She will also need an order for her 2 month supply after she gets the 9 pills.   Has the patient been seen for an appointment in the last year OR does the patient have an upcoming appointment? Yes  Can we respond through MyChart?  No  Agent: Please be advised that Rx refills may take up to 3 business days. We ask that you follow-up with your pharmacy.

## 2023-03-23 NOTE — Telephone Encounter (Signed)
Refill sent as requested. 

## 2023-03-30 ENCOUNTER — Telehealth: Payer: Self-pay

## 2023-03-30 ENCOUNTER — Other Ambulatory Visit (HOSPITAL_COMMUNITY): Payer: Self-pay

## 2023-03-30 NOTE — Telephone Encounter (Signed)
 Pharmacy Patient Advocate Encounter   Received notification from Pt Calls Messages that prior authorization for DULoxetine  HCl 30MG  dr capsules is required/requested.   Insurance verification completed.   The patient is insured through [facility] .   Per test claim: PA required; PA submitted to above mentioned insurance via CoverMyMeds Key/confirmation #/EOC [id number] Status is pending

## 2023-03-30 NOTE — Telephone Encounter (Signed)
 Yes, that the expiration date on CMM and on the approval letter. Patient's plan must be changing for the new year. We can submit another PA before she runs out of medication.

## 2023-03-30 NOTE — Telephone Encounter (Signed)
Please submit PA for Duloxetine.

## 2023-03-30 NOTE — Telephone Encounter (Signed)
 PA request has been Submitted. New Encounter created for follow up. For additional info see Pharmacy Prior Auth telephone encounter from 03/30/23.

## 2023-03-30 NOTE — Telephone Encounter (Signed)
 Copied from CRM [id number]. Topic: Clinical - Medication Question >> Mar 30, 2023  9:43 AM Viola F wrote: Reason for CRM: Patient called regarding her DULoxetine  - her insurance says this med was denied. She's not sure if med needs prior auth. She has been out of this med since Friday 03/26/23.

## 2023-03-30 NOTE — Telephone Encounter (Signed)
Jillian [name] notified PA was approved for her Duloxetine.    Ashleigh,  is that the correct expiration date??

## 2023-03-30 NOTE — Telephone Encounter (Signed)
 Pharmacy Patient Advocate Encounter  Received notification from [facility] that Prior Authorization for DULoxetine HCl 30MG  dr capsules  has been APPROVED from 06/29/22 to 03/30/23   PA #/Case ID/Reference #: [id number]

## 2023-04-02 ENCOUNTER — Other Ambulatory Visit (HOSPITAL_COMMUNITY): Payer: Self-pay

## 2023-04-02 MED ORDER — DULOXETINE HCL 30 MG PO CPEP: 90.0000 mg | ORAL_CAPSULE | Freq: Every day | ORAL | 0 refills | Status: DC

## 2023-04-02 NOTE — Addendum Note (Signed)
 Addended by: Damita Lack on: 04/02/2023 11:28 AM   Modules accepted: Orders

## 2023-04-02 NOTE — Telephone Encounter (Signed)
[facility] [place]. 

## 2023-04-02 NOTE — Telephone Encounter (Signed)
 Patient called in regarding this refill. Relayed message below to patient and she stated that her insurance will not change until February 1st. She stated that if this was approved by her insurance she would like it to go to the [id number] - [place], [place] - [address] Thank you!

## 2023-04-06 ENCOUNTER — Other Ambulatory Visit: Payer: Self-pay | Admitting: Family Medicine

## 2023-04-07 NOTE — Telephone Encounter (Signed)
 Last office visit 11/12/2022 (Video) for oral candida.  Last refilled 01/12/2023 for #200 with no refills.  Next Appt: No future appointments with PCP.

## 2023-04-09 ENCOUNTER — Telehealth: Payer: Self-pay | Admitting: Pharmacist

## 2023-04-09 NOTE — Telephone Encounter (Signed)
 Submitted a Prior Authorization RENEWAL request to [facility] for TALTZ via CoverMyMeds. Will update once we receive a response.  Key: [id number]  Chesley Mires, PharmD, MPH, BCPS, CPP Clinical Pharmacist (Rheumatology and Pulmonology)

## 2023-04-09 NOTE — Telephone Encounter (Signed)
 Received notification from [facility] regarding a prior authorization for TALTZ . Authorization has been APPROVED from 03/31/2023 to 03/29/2024. Approval letter sent to scan center.  Sherry Pennant, PharmD, MPH, BCPS, CPP Clinical Pharmacist (Rheumatology and Pulmonology)

## 2023-04-19 DIAGNOSIS — M9904 Segmental and somatic dysfunction of sacral region: Secondary | ICD-10-CM | POA: Diagnosis not present

## 2023-04-19 DIAGNOSIS — M9903 Segmental and somatic dysfunction of lumbar region: Secondary | ICD-10-CM | POA: Diagnosis not present

## 2023-04-19 DIAGNOSIS — M5416 Radiculopathy, lumbar region: Secondary | ICD-10-CM | POA: Diagnosis not present

## 2023-04-19 DIAGNOSIS — M62830 Muscle spasm of back: Secondary | ICD-10-CM | POA: Diagnosis not present

## 2023-04-23 DIAGNOSIS — G4733 Obstructive sleep apnea (adult) (pediatric): Secondary | ICD-10-CM | POA: Diagnosis not present

## 2023-05-14 DIAGNOSIS — G4733 Obstructive sleep apnea (adult) (pediatric): Secondary | ICD-10-CM | POA: Diagnosis not present

## 2023-05-17 DIAGNOSIS — M9903 Segmental and somatic dysfunction of lumbar region: Secondary | ICD-10-CM | POA: Diagnosis not present

## 2023-05-17 DIAGNOSIS — M62830 Muscle spasm of back: Secondary | ICD-10-CM | POA: Diagnosis not present

## 2023-05-17 DIAGNOSIS — M5416 Radiculopathy, lumbar region: Secondary | ICD-10-CM | POA: Diagnosis not present

## 2023-05-17 DIAGNOSIS — M9904 Segmental and somatic dysfunction of sacral region: Secondary | ICD-10-CM | POA: Diagnosis not present

## 2023-05-24 DIAGNOSIS — G4733 Obstructive sleep apnea (adult) (pediatric): Secondary | ICD-10-CM | POA: Diagnosis not present

## 2023-05-25 ENCOUNTER — Other Ambulatory Visit: Payer: Self-pay | Admitting: Family Medicine

## 2023-05-25 NOTE — Telephone Encounter (Signed)
 Last office visit 11/12/2022 (Video) for oral candida.  Last refilled 03/22/2023 for #180 with no refills.  Next Appt: CPE 08/27/2023

## 2023-06-01 ENCOUNTER — Telehealth: Payer: Self-pay | Admitting: Pharmacist

## 2023-06-01 DIAGNOSIS — L4050 Arthropathic psoriasis, unspecified: Secondary | ICD-10-CM

## 2023-06-01 DIAGNOSIS — Z79899 Other long term (current) drug therapy: Secondary | ICD-10-CM

## 2023-06-01 DIAGNOSIS — L409 Psoriasis, unspecified: Secondary | ICD-10-CM

## 2023-06-01 NOTE — Telephone Encounter (Signed)
 Patient sent my email on 05/28/2023 requesting assistance with PAP renewal for [employer] Taltz  "updated. We have tried to do the on line application and it would not accept my insurance information. As of 05/29/23 my health insurance has changed back to [facility] [employer]. I can send in paper application but what should I do about the Prescribers Information"  I have advised that prescriber form will be completed by MD and updated PA will be needed.   Patient portion emailed to patient since she's having technical difficulties  Will place prescriber form in Dr. Fatima Sanger folder for signature once back on site  Chesley Mires, PharmD, MPH, BCPS, CPP Clinical Pharmacist (Rheumatology and Pulmonology)

## 2023-06-01 NOTE — Telephone Encounter (Signed)
 Submitted a Prior Authorization request to [facility] for TALTZ via CoverMyMeds. Will update once we receive a response.  Key: [id number]

## 2023-06-02 ENCOUNTER — Other Ambulatory Visit (HOSPITAL_COMMUNITY): Payer: Self-pay

## 2023-06-02 NOTE — Telephone Encounter (Signed)
 Received notification from [facility] regarding a prior authorization for TALTZ. Authorization has been APPROVED from 06/01/2023 to 03/29/2024. Approval letter sent to scan center.   Per test claim, copay for 28 days supply is $1810.13  Authorization # [id number]  Based on income that she wrote on application that she emailed back to me yesterday, she will not qualify for patient assistance program.    ATC patient to discuss above. Unable to reach. Left VM.  Chesley Mires, PharmD, MPH, BCPS, CPP Clinical Pharmacist (Rheumatology and Pulmonology)

## 2023-06-07 ENCOUNTER — Other Ambulatory Visit (HOSPITAL_COMMUNITY): Payer: Self-pay

## 2023-06-07 ENCOUNTER — Other Ambulatory Visit: Payer: Self-pay

## 2023-06-07 ENCOUNTER — Other Ambulatory Visit: Payer: Self-pay | Admitting: Family Medicine

## 2023-06-07 MED ORDER — TALTZ 80 MG/ML ~~LOC~~ SOAJ
SUBCUTANEOUS | 0 refills | Status: DC
  Filled 2023-06-07: qty 1, 28d supply, fill #0

## 2023-06-07 NOTE — Addendum Note (Signed)
 Addended by: Murrell Redden on: 06/07/2023 11:00 AM   Modules accepted: Orders

## 2023-06-07 NOTE — Telephone Encounter (Signed)
 Patient called, she said order the Altamease Oiler, she is going to get it through her insurance.

## 2023-06-07 NOTE — Progress Notes (Signed)
 Specialty Pharmacy Initial Fill Coordination Note  Jillian [name] is a 68 y.o. adult contacted today regarding initial fill of specialty medication(s) Ixekizumab Altamease Oiler)   Patient requested Delivery   Delivery date: 06/09/23   Verified address: [address]   [place] [address]   Medication will be filled on 06/08/23.   Patient is enrolled into the MPPP and is aware of $0 copayment. Per Sheria Lang at [facility], [place] called and provided billing info.

## 2023-06-07 NOTE — Progress Notes (Signed)
 Patient on Taltz 80mg  subcut every 28 days (maintenance) as monotherapy for PsA/PsO. She no longer qualifies for patient assistance and will move forward with paying through Medicare Prescription Payment Plan  Chesley Mires, PharmD, MPH, BCPS, CPP Clinical Pharmacist (Rheumatology and Pulmonology)

## 2023-06-07 NOTE — Telephone Encounter (Signed)
 Spoke with patient regarding income. She states that the income she wrote on application is correct since her and her husband are working and have additional income  She states that she will discuss with her husband about enrolling into Medicare Rx Payment Plan and call me back later today. Requested she leave VM  Chesley Mires, PharmD, MPH, BCPS, CPP Clinical Pharmacist (Rheumatology and Pulmonology)

## 2023-06-07 NOTE — Telephone Encounter (Signed)
 Rx sent to [facility] for onboarding.  Spoke with patient - she states that she will enroll into the [facility] Prescription payment Plan.  Chesley Mires, PharmD, MPH, BCPS, CPP Clinical Pharmacist (Rheumatology and Pulmonology)

## 2023-06-08 ENCOUNTER — Other Ambulatory Visit: Payer: Self-pay

## 2023-06-08 ENCOUNTER — Other Ambulatory Visit (HOSPITAL_COMMUNITY): Payer: Self-pay

## 2023-06-15 DIAGNOSIS — B9689 Other specified bacterial agents as the cause of diseases classified elsewhere: Secondary | ICD-10-CM | POA: Diagnosis not present

## 2023-06-15 DIAGNOSIS — Z03818 Encounter for observation for suspected exposure to other biological agents ruled out: Secondary | ICD-10-CM | POA: Diagnosis not present

## 2023-06-15 DIAGNOSIS — R051 Acute cough: Secondary | ICD-10-CM | POA: Diagnosis not present

## 2023-06-15 DIAGNOSIS — J019 Acute sinusitis, unspecified: Secondary | ICD-10-CM | POA: Diagnosis not present

## 2023-06-23 DIAGNOSIS — T8484XA Pain due to internal orthopedic prosthetic devices, implants and grafts, initial encounter: Secondary | ICD-10-CM | POA: Diagnosis not present

## 2023-06-24 ENCOUNTER — Other Ambulatory Visit: Payer: Self-pay | Admitting: Pharmacy Technician

## 2023-06-24 ENCOUNTER — Other Ambulatory Visit: Payer: Self-pay

## 2023-06-24 ENCOUNTER — Telehealth: Payer: Self-pay | Admitting: *Deleted

## 2023-06-24 ENCOUNTER — Other Ambulatory Visit: Payer: Self-pay | Admitting: Physician Assistant

## 2023-06-24 DIAGNOSIS — Z79899 Other long term (current) drug therapy: Secondary | ICD-10-CM

## 2023-06-24 DIAGNOSIS — L4050 Arthropathic psoriasis, unspecified: Secondary | ICD-10-CM

## 2023-06-24 DIAGNOSIS — Z9225 Personal history of immunosupression therapy: Secondary | ICD-10-CM

## 2023-06-24 DIAGNOSIS — Z111 Encounter for screening for respiratory tuberculosis: Secondary | ICD-10-CM

## 2023-06-24 DIAGNOSIS — L409 Psoriasis, unspecified: Secondary | ICD-10-CM

## 2023-06-24 MED ORDER — NYSTATIN 100000 UNIT/ML MT SUSP: 5.0000 mL | Freq: Four times a day (QID) | OROMUCOSAL | 0 refills | Status: DC

## 2023-06-24 MED ORDER — TALTZ 80 MG/ML ~~LOC~~ SOAJ
SUBCUTANEOUS | 0 refills | Status: DC
  Filled 2023-06-24: qty 1, 28d supply, fill #0

## 2023-06-24 NOTE — Progress Notes (Signed)
 Specialty Pharmacy Refill Coordination Note  Jillian [name] is a 68 y.o. adult contacted today regarding refills of specialty medication(s) Ixekizumab Altamease Oiler)   Patient requested (Patient-Rptd) Delivery   Delivery date: (Patient-Rptd) 07/02/23   Verified address: (Patient-Rptd) [address] [place] [address]   Medication will be filled on 07/01/23.

## 2023-06-24 NOTE — Telephone Encounter (Signed)
 Copied from CRM [id number]. Topic: Clinical - Prescription Issue >> Jun 24, 2023 11:36 AM Pascal Lux wrote: Reason for CRM: Patient stated she was on antibiotics due to thrush and is needing a prescription of Nystatin sent to [id number] [place], [place] - [address] AT [facility] [address] [place] [address] [place] [address] Phone: [phone] Fax: [phone]

## 2023-06-24 NOTE — Telephone Encounter (Signed)
 Last Fill: 06/07/2023 (30 day supply)  Labs: 03/16/2023  CBC and CMP WNL   TB Gold: 07/13/2022 Neg    Next Visit: 08/25/2023  Last Visit: 03/16/2023  [id number] arthritis   Current Dose per office note 03/16/2023: Taltz 80 mg sq injections every 4 weeks.   Patient advised she is due to update her lab work. Patient states she will come into the office to update.   Okay to refill Taltz?

## 2023-06-24 NOTE — Telephone Encounter (Signed)
 Jillian [name] notified via telephone that the  prescription for Nystatin has been sent to her pharmacy as requested.

## 2023-06-25 ENCOUNTER — Other Ambulatory Visit: Payer: Self-pay

## 2023-06-25 ENCOUNTER — Telehealth: Admitting: Family Medicine

## 2023-06-28 DIAGNOSIS — M5416 Radiculopathy, lumbar region: Secondary | ICD-10-CM | POA: Diagnosis not present

## 2023-06-28 DIAGNOSIS — M9904 Segmental and somatic dysfunction of sacral region: Secondary | ICD-10-CM | POA: Diagnosis not present

## 2023-06-28 DIAGNOSIS — M62830 Muscle spasm of back: Secondary | ICD-10-CM | POA: Diagnosis not present

## 2023-06-28 DIAGNOSIS — M9903 Segmental and somatic dysfunction of lumbar region: Secondary | ICD-10-CM | POA: Diagnosis not present

## 2023-06-30 ENCOUNTER — Other Ambulatory Visit: Payer: Self-pay | Admitting: Family Medicine

## 2023-06-30 NOTE — Telephone Encounter (Unsigned)
 Copied from CRM [id number]. Topic: Clinical - Medication Refill >> Jun 30, 2023 12:53 PM Shereese L wrote: Most Recent Primary Care Visit:  Provider: Kerby Nora E  Department: [facility]  Visit Type: MYCHART VIDEO VISIT  Date: 11/12/2022  Medication: pregabalin (LYRICA) 150 MG capsule  Has the patient contacted their pharmacy? Yes (Agent: If no, request that the patient contact the pharmacy for the refill. If patient does not wish to contact the pharmacy document the reason why and proceed with request.) (Agent: If yes, when and what did the pharmacy advise?)  Is this the correct pharmacy for this prescription? Yes If no, delete pharmacy and type the correct one.  This is the patient's preferred pharmacy:  [id number] - [place], [place] - [address] AT [facility] [address] [place] [address] [place] [address] Phone: [phone] Fax: [phone]   Has the prescription been filled recently? Yes  Is the patient out of the medication? Yes  Has the patient been seen for an appointment in the last year OR does the patient have an upcoming appointment? Yes  Can we respond through MyChart? Yes  Agent: Please be advised that Rx refills may take up to 3 business days. We ask that you follow-up with your pharmacy.

## 2023-06-30 NOTE — Telephone Encounter (Unsigned)
 Copied from CRM [id number]. Topic: Clinical - Medication Refill >> Jun 30, 2023 12:53 PM Shereese L wrote: Most Recent Primary Care Visit:  Provider: Kerby Nora E  Department: [facility]  Visit Type: MYCHART VIDEO VISIT  Date: 11/12/2022  Medication: pregabalin (LYRICA) 150 MG capsule  Has the patient contacted their pharmacy? Yes (Agent: If no, request that the patient contact the pharmacy for the refill. If patient does not wish to contact the pharmacy document the reason why and proceed with request.) (Agent: If yes, when and what did the pharmacy advise?)  Is this the correct pharmacy for this prescription? Yes If no, delete pharmacy and type the correct one.  This is the patient's preferred pharmacy:  [id number] - [place], [place] - [address] AT [facility] OF [address] [place] [address] [place] [address] Phone: [phone] Fax: [phone]   Has the prescription been filled recently? {yes/no:20286}  Is the patient out of the medication? Yes  Has the patient been seen for an appointment in the last year OR does the patient have an upcoming appointment? Yes  Can we respond through MyChart? Yes  Agent: Please be advised that Rx refills may take up to 3 business days. We ask that you follow-up with your pharmacy.

## 2023-07-01 ENCOUNTER — Other Ambulatory Visit: Payer: Self-pay

## 2023-07-01 MED ORDER — PREGABALIN 150 MG PO CAPS: 150.0000 mg | ORAL_CAPSULE | Freq: Two times a day (BID) | ORAL | 0 refills | Status: DC

## 2023-07-01 NOTE — Telephone Encounter (Signed)
 Last office visit 11/12/2022 for oral candida. Last refilled 04/07/23 for #200 with no refills.  Next Appt: CPE 08/27/2023.

## 2023-07-02 ENCOUNTER — Telehealth: Admitting: Family Medicine

## 2023-07-02 ENCOUNTER — Encounter: Payer: Self-pay | Admitting: Family Medicine

## 2023-07-02 VITALS — Ht 67.5 in

## 2023-07-02 DIAGNOSIS — L4050 Arthropathic psoriasis, unspecified: Secondary | ICD-10-CM

## 2023-07-02 DIAGNOSIS — M797 Fibromyalgia: Secondary | ICD-10-CM

## 2023-07-02 DIAGNOSIS — G894 Chronic pain syndrome: Secondary | ICD-10-CM | POA: Diagnosis not present

## 2023-07-02 MED ORDER — PREGABALIN 150 MG PO CAPS: 150.0000 mg | ORAL_CAPSULE | Freq: Three times a day (TID) | ORAL | 3 refills | Status: DC

## 2023-07-02 NOTE — Assessment & Plan Note (Signed)
 Chronic, recent worsening over the winter.  Will increase Lyrica back up to 150 mg 3 times a day.  She will continue tramadol 50 mg 3 times a day and Cymbalta 90 mg daily.  She is also using Wellbutrin 150 mg XL p.o. daily and trazodone for sleep. She has tolerated 3 times daily dosing of Lyrica in the past without sedation.  Will reassess in 1-2 month at her upcoming physical.

## 2023-07-02 NOTE — Progress Notes (Signed)
 VIRTUAL VISIT A virtual visit is felt to be most appropriate for this patient at this time.   I connected with the patient on 07/02/23 at  9:20 AM EDT by virtual telehealth platform and verified that I am speaking with the correct person using two identifiers.   I discussed the limitations, risks, security and privacy concerns of performing an evaluation and management service by  virtual telehealth platform and the availability of in person appointments. I also discussed with the patient that there may be a patient responsible charge related to this service. The patient expressed understanding and agreed to proceed.  Patient location: Home Provider Location: [facility] Participants: Kerby Nora and Janeal Holmes   Chief Complaint  Patient presents with   Pain    History of Present Illness:  68 y.o. adult patient of Shaka Cardin E, MD presents with chronic pain    She reports she has set up this appt given she is interested in increasing the lyrica to  3 times a day  She has noted an increase in  fibromyalgia pain .Marland Kitchen Total body pain and ache. Fatigue at the end of the day. Psoriatic arthritis and joints are not the source of the pain, it is mainly her fibromyalgia.  She has taken extra lyrica  given increased pain since the winter.  No to [place].. no oversedation.  Psoriatic arthritis, fibromyalgia: followed by rheum. On Taltz.  She is using tramadol 3 a day. She  will continue cymbalta 90 mg daily wellbutrin  She has upcoming OV for CPX in May   COVID 19 screen No recent travel or known exposure to COVID19 The patient denies respiratory symptoms of COVID 19 at this time.  The importance of social distancing was discussed today.   Review of Systems  Constitutional:  Positive for malaise/fatigue. Negative for chills and fever.  HENT:  Negative for congestion and ear pain.   Eyes:  Negative for pain and redness.  Respiratory:  Negative for cough and shortness of  breath.   Cardiovascular:  Negative for chest pain, palpitations and leg swelling.  Gastrointestinal:  Negative for abdominal pain, blood in stool, constipation, diarrhea, nausea and vomiting.  Genitourinary:  Negative for dysuria.  Musculoskeletal:  Positive for myalgias. Negative for falls.  Skin:  Negative for rash.  Neurological:  Negative for dizziness.  Psychiatric/Behavioral:  Negative for depression. The patient is not nervous/anxious.       Past Medical History:  Diagnosis Date   Anxiety    Cancer (HCC)    basal cell 2016   Dermatitis    per patient    Fibromyalgia    Prediabetes 12/03/2017   Primary localized osteoarthritis of right knee 02/07/2019   Sleep apnea    uses CPAP   Thrush    UTI (urinary tract infection)     reports that she quit smoking about 31 years ago. Her smoking use included cigarettes. She started smoking about 46 years ago. She has a 15 pack-year smoking history. She has been exposed to tobacco smoke. She has never used smokeless tobacco. She reports that she does not drink alcohol and does not use drugs.   Current Outpatient Medications:    acetaminophen (TYLENOL) 500 MG tablet, Take 500 mg by mouth every 6 (six) hours as needed., Disp: , Rfl:    augmented betamethasone dipropionate (DIPROLENE-AF) 0.05 % ointment, Apply topically 2 (two) times daily as needed., Disp: 50 g, Rfl: 2   buPROPion (WELLBUTRIN XL) 150 MG 24  hr tablet, TAKE 1 TABLET DAILY, Disp: 100 tablet, Rfl: 0   Calcium Carbonate (CALCIUM 500 PO), Take 1,000 mg by mouth daily., Disp: , Rfl:    cholecalciferol (VITAMIN D3) 25 MCG (1000 UNIT) tablet, Take 1,000 Units by mouth daily., Disp: , Rfl:    cyclobenzaprine (FLEXERIL) 5 MG tablet, TAKE 1 TABLET DAILY AS     NEEDED FOR MUSCLE SPASMS, Disp: 30 tablet, Rfl: 0   DULoxetine (CYMBALTA) 30 MG capsule, TAKE 3 CAPSULES BY MOUTH EVERY DAY, Disp: 270 capsule, Rfl: 0   fluocinonide (LIDEX) 0.05 % external solution, Apply 1 Application  topically 2 (two) times daily as needed (PSORIASIS)., Disp: 60 mL, Rfl: 2   ibuprofen (ADVIL) 200 MG tablet, Take 200 mg by mouth every 6 (six) hours as needed., Disp: , Rfl:    ixekizumab (TALTZ) 80 MG/ML pen, INJECT 80MG  (1 PEN) UNDER THE SKIN EVERY 4 WEEKS, Disp: 1 mL, Rfl: 0   Multiple Vitamins-Minerals (BARIATRIC MULTIVITAMINS/IRON PO), Take 1 capsule by mouth daily., Disp: , Rfl:    nystatin (MYCOSTATIN) 100000 UNIT/ML suspension, Take 5 mLs (500,000 Units total) by mouth 4 (four) times daily., Disp: 60 mL, Rfl: 0   pregabalin (LYRICA) 150 MG capsule, Take 1 capsule (150 mg total) by mouth in the morning, at noon, and at bedtime., Disp: 90 capsule, Rfl: 3   senna-docusate (SENOKOT S) 8.6-50 MG tablet, Take 1 tablet by mouth daily., Disp: , Rfl:    traMADol (ULTRAM) 50 MG tablet, TAKE 1 TABLET BY MOUTH EVERY 8 HOURS AS NEEDED, Disp: 180 tablet, Rfl: 0   traZODone (DESYREL) 50 MG tablet, Take 1 tablet (50 mg total) by mouth at bedtime as needed for sleep., Disp: 90 tablet, Rfl: 1   Observations/Objective: Height 5' 7.5" (1.715 m).  Physical Exam Constitutional:      General: The patient is not in acute distress. Pulmonary:     Effort: Pulmonary effort is normal. No respiratory distress.  Neurological:     Mental Status: The patient is alert and oriented to person, place, and time.  Psychiatric:        Mood and Affect: Mood normal.        Behavior: Behavior normal.    Assessment and Plan Fibromyalgia Assessment & Plan: Chronic, recent worsening over the winter.  Will increase Lyrica back up to 150 mg 3 times a day.  She will continue tramadol 50 mg 3 times a day and Cymbalta 90 mg daily.  She is also using Wellbutrin 150 mg XL p.o. daily and trazodone for sleep. She has tolerated 3 times daily dosing of Lyrica in the past without sedation.  Will reassess in 1-2 month at her upcoming physical.   Psoriatic arthritis (HCC) Assessment & Plan: Chronic, stable control per  rheumatology   Chronic pain syndrome  Other orders -     Pregabalin; Take 1 capsule (150 mg total) by mouth in the morning, at noon, and at bedtime.  Dispense: 90 capsule; Refill: 3      I discussed the assessment and treatment plan with the patient. The patient was provided an opportunity to ask questions and all were answered. The patient agreed with the plan and demonstrated an understanding of the instructions.   The patient was advised to call back or seek an in-person evaluation if the symptoms worsen or if the condition fails to improve as anticipated.     Kerby Nora, MD

## 2023-07-02 NOTE — Assessment & Plan Note (Signed)
 Chronic, stable control per rheumatology

## 2023-07-19 DIAGNOSIS — H43813 Vitreous degeneration, bilateral: Secondary | ICD-10-CM | POA: Diagnosis not present

## 2023-07-19 DIAGNOSIS — M3501 Sicca syndrome with keratoconjunctivitis: Secondary | ICD-10-CM | POA: Diagnosis not present

## 2023-07-19 DIAGNOSIS — Z961 Presence of intraocular lens: Secondary | ICD-10-CM | POA: Diagnosis not present

## 2023-07-20 ENCOUNTER — Other Ambulatory Visit: Payer: Self-pay

## 2023-07-21 ENCOUNTER — Other Ambulatory Visit: Payer: Self-pay | Admitting: Family Medicine

## 2023-07-21 NOTE — Telephone Encounter (Signed)
 Last office visit 07/02/2023 for Fibromyalgia/Chronic Pain Syndrome and Psoriatic Arthritis.  Last refilled 05/25/2023 for #180 with no refills.  Next Appt: CPE 08/27/2023.

## 2023-07-22 ENCOUNTER — Other Ambulatory Visit: Payer: Self-pay

## 2023-07-22 ENCOUNTER — Other Ambulatory Visit: Payer: Self-pay | Admitting: Pharmacy Technician

## 2023-07-22 ENCOUNTER — Other Ambulatory Visit (HOSPITAL_COMMUNITY): Payer: Self-pay

## 2023-07-22 ENCOUNTER — Other Ambulatory Visit: Payer: Self-pay | Admitting: Physician Assistant

## 2023-07-22 ENCOUNTER — Other Ambulatory Visit: Payer: Self-pay | Admitting: Family Medicine

## 2023-07-22 DIAGNOSIS — L409 Psoriasis, unspecified: Secondary | ICD-10-CM

## 2023-07-22 DIAGNOSIS — L4050 Arthropathic psoriasis, unspecified: Secondary | ICD-10-CM

## 2023-07-22 DIAGNOSIS — Z79899 Other long term (current) drug therapy: Secondary | ICD-10-CM

## 2023-07-22 MED ORDER — TRAMADOL HCL 50 MG PO TABS: 50.0000 mg | ORAL_TABLET | Freq: Three times a day (TID) | ORAL | 0 refills | Status: DC | PRN

## 2023-07-22 NOTE — Progress Notes (Signed)
 Specialty Pharmacy Refill Coordination Note  Jillian [name] is a 68 y.o. adult contacted today regarding refills of specialty medication(s) Ixekizumab  (Taltz )   Patient requested Delivery   Delivery date: 07/29/23   Verified address: [address] [place] [address]   Medication will be filled on 07/28/23.   This fill date is pending response to refill request from provider. Patient is aware and if they have not received fill by intended date they must follow up with pharmacy.

## 2023-07-22 NOTE — Telephone Encounter (Signed)
 Last office visit 07/02/23 (virtual) for Fibromyalgia. Last refilled 05/25/2023 for #180 with no refills.  Next Appt: CPE 08/27/2023.

## 2023-07-22 NOTE — Progress Notes (Signed)
 RR denied by provider. Patient needs to contact provider first. Left voicemail.

## 2023-07-23 ENCOUNTER — Other Ambulatory Visit: Payer: Self-pay

## 2023-07-23 DIAGNOSIS — Z79899 Other long term (current) drug therapy: Secondary | ICD-10-CM | POA: Diagnosis not present

## 2023-07-23 DIAGNOSIS — Z9225 Personal history of immunosupression therapy: Secondary | ICD-10-CM | POA: Diagnosis not present

## 2023-07-23 DIAGNOSIS — Z111 Encounter for screening for respiratory tuberculosis: Secondary | ICD-10-CM

## 2023-07-26 ENCOUNTER — Other Ambulatory Visit: Payer: Self-pay

## 2023-07-26 DIAGNOSIS — Z03818 Encounter for observation for suspected exposure to other biological agents ruled out: Secondary | ICD-10-CM | POA: Diagnosis not present

## 2023-07-26 DIAGNOSIS — J40 Bronchitis, not specified as acute or chronic: Secondary | ICD-10-CM | POA: Diagnosis not present

## 2023-07-26 NOTE — Progress Notes (Signed)
 Patient filled out questionnaire but refill request was denied on 4.24.25. Sent patient mychart.

## 2023-07-26 NOTE — Progress Notes (Signed)
 CBC and CMP WNL

## 2023-07-27 LAB — COMPREHENSIVE METABOLIC PANEL WITH GFR
AG Ratio: 2.1 (calc) (ref 1.0–2.5)
ALT: 18 U/L (ref 6–29)
AST: 17 U/L (ref 10–35)
Albumin: 4.2 g/dL (ref 3.6–5.1)
Alkaline phosphatase (APISO): 98 U/L (ref 37–153)
BUN: 18 mg/dL (ref 7–25)
CO2: 28 mmol/L (ref 20–32)
Calcium: 9.5 mg/dL (ref 8.6–10.4)
Chloride: 105 mmol/L (ref 98–110)
Creat: 0.78 mg/dL (ref 0.50–1.05)
Globulin: 2 g/dL (ref 1.9–3.7)
Glucose, Bld: 99 mg/dL (ref 65–99)
Potassium: 4.2 mmol/L (ref 3.5–5.3)
Sodium: 140 mmol/L (ref 135–146)
Total Bilirubin: 0.3 mg/dL (ref 0.2–1.2)
Total Protein: 6.2 g/dL (ref 6.1–8.1)
eGFR: 83 mL/min/{1.73_m2} (ref >=60)

## 2023-07-27 LAB — CBC WITH DIFFERENTIAL/PLATELET
Absolute Lymphocytes: 1372 {cells}/uL (ref 850–3900)
Absolute Monocytes: 616 {cells}/uL (ref 200–950)
Basophils Absolute: 40 {cells}/uL (ref 0–200)
Basophils Relative: 1 %
Eosinophils Absolute: 52 {cells}/uL (ref 15–500)
Eosinophils Relative: 1.3 %
HCT: 36.9 % (ref 35.0–45.0)
Hemoglobin: 12.4 g/dL (ref 11.7–15.5)
MCH: 31.4 pg (ref 27.0–33.0)
MCHC: 33.6 g/dL (ref 32.0–36.0)
MCV: 93.4 fL (ref 80.0–100.0)
MPV: 11.7 fL (ref 7.5–12.5)
Monocytes Relative: 15.4 %
Neutro Abs: 1920 {cells}/uL (ref 1500–7800)
Neutrophils Relative %: 48 %
Platelets: 179 10*3/uL (ref 140–400)
RBC: 3.95 10*6/uL (ref 3.80–5.10)
RDW: 12.3 % (ref 11.0–15.0)
Total Lymphocyte: 34.3 %
WBC: 4 10*3/uL (ref 3.8–10.8)

## 2023-07-27 LAB — QUANTIFERON-TB GOLD PLUS
Mitogen-NIL: 8.8 [IU]/mL
NIL: 0.07 [IU]/mL
QuantiFERON-TB Gold Plus: NEGATIVE
TB1-NIL: <0 [IU]/mL
TB2-NIL: <0 [IU]/mL

## 2023-07-27 NOTE — Progress Notes (Signed)
 TB gold negative

## 2023-08-05 ENCOUNTER — Telehealth: Payer: Self-pay | Admitting: *Deleted

## 2023-08-05 DIAGNOSIS — E781 Pure hyperglyceridemia: Secondary | ICD-10-CM

## 2023-08-05 DIAGNOSIS — E559 Vitamin D deficiency, unspecified: Secondary | ICD-10-CM

## 2023-08-05 DIAGNOSIS — R7303 Prediabetes: Secondary | ICD-10-CM

## 2023-08-05 NOTE — Telephone Encounter (Signed)
-----   Message from Gerry Krone sent at 08/04/2023  3:12 PM EDT ----- Regarding: Lab orders for Fri, 5.23.25 Patient is scheduled for CPX labs, please order future labs, Thanks , Anselmo Kings

## 2023-08-12 NOTE — Progress Notes (Signed)
 Office Visit Note  Patient: Jillian [name]             Date of Birth: 03-07-1956           MRN: [id number]             PCP: Judithann Novas, MD Referring: Judithann Novas, MD Visit Date: 08/25/2023 Occupation: @GUAROCC @  Subjective:  Medication management   History of Present Illness: [name] is a 68 y.o. adult with psoriatic arthritis and psoriasis.  She returns today after her last visit in December 2024.  She states she developed an upper respiratory tract infection in February for which she had to come off Taltz .  She states she was treated with antibiotics and and prednisone, she finally recovered.  She has not resumed Taltz  yet.  She states she has been having pain and discomfort in her shoulders, hands, knees, ankles and her feet.  She believes the pain is due to underlying osteoarthritis and not psoriatic arthritis.  She has not had flare of psoriasis on her palms.    Activities of Daily Living:  Patient reports morning stiffness for all day. Patient Denies nocturnal pain.  Difficulty dressing/grooming: Denies Difficulty climbing stairs: Denies Difficulty getting out of chair: Denies Difficulty using hands for taps, buttons, cutlery, and/or writing: Denies  Review of Systems  Constitutional:  Negative for fatigue.  HENT:  Positive for mouth dryness. Negative for mouth sores.   Eyes:  Positive for dryness.  Respiratory:  Negative for shortness of breath.   Cardiovascular:  Negative for chest pain and palpitations.  Gastrointestinal:  Positive for constipation. Negative for blood in stool and diarrhea.  Endocrine: Negative for increased urination.  Genitourinary:  Negative for involuntary urination.  Musculoskeletal:  Positive for joint pain, joint pain, myalgias, morning stiffness, muscle tenderness and myalgias. Negative for gait problem, joint swelling and muscle weakness.  Skin:  Negative for color change, rash, hair loss and sensitivity to sunlight.   Allergic/Immunologic: Positive for susceptible to infections.  Neurological:  Negative for dizziness and headaches.  Hematological:  Negative for swollen glands.  Psychiatric/Behavioral:  Positive for sleep disturbance. Negative for depressed mood. The patient is not nervous/anxious.     PMFS History:  Patient Active Problem List   Diagnosis Date Noted   Oral candida 11/12/2022   Family history of colon cancer 10/21/2022   Screening for colon cancer 10/21/2022   Psoriatic arthritis (HCC) 05/07/2021   Psoriasis 05/07/2021   Primary osteoarthritis of both hands 05/07/2021   Primary osteoarthritis of both feet 05/07/2021   Pitting of nails 01/23/2020   OSA (obstructive sleep apnea) 04/12/2019   S/P left unicompartmental knee replacement 03/21/2019   Osteoarthritis of left knee 03/09/2019   Primary localized osteoarthritis of right knee 02/07/2019   Status post right partial knee replacement 02/07/2019   Prediabetes 12/03/2017   Non-restorative sleep 02/27/2016   Vitamin D  deficiency 10/28/2015   Generalized anxiety disorder 11/25/2012   HYPERTRIGLYCERIDEMIA 12/20/2008   MENOPAUSE, EARLY 10/04/2008   Fibromyalgia 10/04/2008    Past Medical History:  Diagnosis Date   Anxiety    Cancer (HCC)    basal cell 2016   Dermatitis    per patient    Fibromyalgia    Prediabetes 12/03/2017   Primary localized osteoarthritis of right knee 02/07/2019   Sleep apnea    uses CPAP   Thrush    UTI (urinary tract infection)     Family History  Problem Relation Age of Onset  Cancer Father        colon   Hyperlipidemia Father    Hypertension Father    Diabetes Sister    Psoriasis Sister    Psoriasis Brother    Arthritis Brother    Osteoarthritis Brother    Diabetes Brother    Stroke Brother    Throat cancer Brother    Cancer Maternal Grandmother        colon   Hypertension Maternal Grandmother    Anuerysm Paternal Grandfather    Healthy Daughter    Healthy Son    Cancer  Paternal Aunt        colon   Breast cancer Neg Hx    Past Surgical History:  Procedure Laterality Date   BIOPSY  02/05/2021   Procedure: BIOPSY;  Surgeon: Stechschulte, Avon Boers, MD;  Location: [facility];  Service: General;;   CATARACT EXTRACTION W/PHACO Left 12/15/2022   Procedure: CATARACT EXTRACTION PHACO AND INTRAOCULAR LENS PLACEMENT (IOC) LEFT MALYUGIN  CLAREON VIVITY LENS 3.03 00:22.3;  Surgeon: Clair Crews, MD;  Location: [facility];  Service: Ophthalmology;  Laterality: Left;   CATARACT EXTRACTION W/PHACO Right 12/29/2022   Procedure: CATARACT EXTRACTION PHACO AND INTRAOCULAR LENS PLACEMENT (IOC) RIGHT MALYUGIN  CLAREON VIVITY LENS 3.15 00:21.2;  Surgeon: Clair Crews, MD;  Location: [facility];  Service: Ophthalmology;  Laterality: Right;   CESAREAN SECTION     times 2   COLONOSCOPY WITH PROPOFOL  N/A 10/21/2022   Procedure: COLONOSCOPY WITH PROPOFOL ;  Surgeon: Selena Daily, MD;  Location: [facility];  Service: Gastroenterology;  Laterality: N/A;   ESOPHAGOGASTRODUODENOSCOPY N/A 02/05/2021   Procedure: ESOPHAGOGASTRODUODENOSCOPY (EGD);  Surgeon: Junie Olds, MD;  Location: [facility];  Service: General;  Laterality: N/A;   EYE SURGERY Bilateral 04/24/2020   bilateral eyelids    GASTRIC BYPASS  06/03/2021   HERNIA REPAIR     PARTIAL KNEE ARTHROPLASTY Right 02/07/2019   Procedure: UNICOMPARTMENTAL KNEE;  Surgeon: Osa Blase, MD;  Location: [facility];  Service: Orthopedics;  Laterality: Right;   PARTIAL KNEE ARTHROPLASTY Left 03/21/2019   Procedure: UNICOMPARTMENTAL KNEE;  Surgeon: Osa Blase, MD;  Location: [facility];  Service: Orthopedics;  Laterality: Left;   UPPER GI ENDOSCOPY  03/2021   UPPER GI ENDOSCOPY N/A 06/03/2021   Procedure: UPPER GI ENDOSCOPY;  Surgeon: Junie Olds, MD;  Location: [facility];  Service: General;  Laterality: N/A;   Social History   Social History Narrative   Regular exercise-yes, but seasonal  walks and swims   Diet: healthy, low carbohydrates, no caffeine   Immunization History  Administered Date(s) Administered   Influenza-Unspecified 01/01/2020, 12/28/2020, 12/28/2021   Moderna Sars-Covid-2 Vaccination 05/23/2019, 07/04/2019   PNEUMOCOCCAL CONJUGATE-20 08/16/2020   Td 03/30/2004   Tdap 10/28/2015   Zoster Recombinant(Shingrix) 07/09/2021, 08/03/2022   Zoster, Live 08/30/2014     Objective: Vital Signs: BP 113/74 (BP Location: Left Arm, Patient Position: Sitting, Cuff Size: Normal)   Pulse 80   Resp 14   Ht 5\' 8"  (1.727 m)   Wt 194 lb 12.8 oz (88.4 kg)   BMI 29.62 kg/m    Physical Exam Vitals and nursing note reviewed.  Constitutional:      Appearance: She is well-developed.  HENT:     Head: Normocephalic and atraumatic.  Eyes:     Conjunctiva/sclera: Conjunctivae normal.     Pupils: Pupils are equal, round, and reactive to light.  Cardiovascular:     Rate and Rhythm: Normal rate and regular rhythm.  Heart sounds: Normal heart sounds.  Pulmonary:     Effort: Pulmonary effort is normal.     Breath sounds: Normal breath sounds.  Abdominal:     General: Bowel sounds are normal.     Palpations: Abdomen is soft.  Musculoskeletal:     Cervical back: Normal range of motion and neck supple.  Skin:    General: Skin is warm and dry.     Capillary Refill: Capillary refill takes less than 2 seconds.  Neurological:     Mental Status: She is alert and oriented to person, place, and time.  Psychiatric:        Behavior: Behavior normal.      Musculoskeletal Exam: Cervical spine with good range of motion.  She had no tenderness over thoracic or lumbar spine.  She had bilateral SI joint tenderness.  Shoulder joints, elbow joints, wrist joints, MCPs PIPs and DIPs were in good range of motion with no synovitis.  Bilateral PIP and DIP thickening was noted.  Nail pitting was noted.  Hip joints and knee joints in good range of motion.  She had discomfort range of motion  of her knee joints without any warmth swelling or effusion.  There was no tenderness over her ankles or MTPs.  CDAI Exam: CDAI Score: -- Patient Global: --; Provider Global: -- Swollen: --; Tender: -- Joint Exam 08/25/2023   No joint exam has been documented for this visit   There is currently no information documented on the homunculus. Go to the Rheumatology activity and complete the homunculus joint exam.  Investigation: No additional findings.  Imaging: No results found.  Recent Labs: Lab Results  Component Value Date   WBC 4.0 07/23/2023   HGB 12.4 07/23/2023   PLT 179 07/23/2023   NA 140 07/23/2023   K 4.2 07/23/2023   CL 105 07/23/2023   CO2 28 07/23/2023   GLUCOSE 99 07/23/2023   BUN 18 07/23/2023   CREATININE 0.78 07/23/2023   BILITOT 0.3 07/23/2023   ALKPHOS 76 09/19/2021   AST 17 07/23/2023   ALT 18 07/23/2023   PROT 6.2 07/23/2023   ALBUMIN  4.3 09/19/2021   CALCIUM 9.5 07/23/2023   GFRAA 83 07/16/2020   QFTBGOLDPLUS NEGATIVE 07/23/2023    Speciality Comments: tremfya  -started September 16, 2020; Taltz  started 07/02/21  Procedures:  No procedures performed Allergies: Patient has no known allergies.   Assessment / Plan:     Visit Diagnoses: Psoriatic arthritis (HCC) - inflammatory arthritis, psoriasis, plantar fasciitis, fingernail pitting, RF-, HLA-B27-, chronic SI joint pain, family hx of psoriasis and psoriatic arthritis: Patient has been off Taltz  since February 2025 due to infection.  She has not resumed Taltz .  She plans to resume Taltz  today.  She has been experiencing increased pain and discomfort.  Patient states it is difficult for her to save is due to underlying psoriatic arthritis flare or osteoarthritis.  No synovitis was noted on the examination.  She had tenderness over SI joints.  She discomfort with range of motion of bilateral knee joints.  She was advised to resume Taltz .  Psoriasis - Diagnosed by Dr. Gwendalyn Lemma.  Palmar psoriasis has cleared  since initiating Taltz .  She had no recurrence of psoriasis.  High risk medication use - Taltz  80 mg sq injections every 4 weeks.  July 23, 2023 CBC and CMP were normal.  She was advised to have repeat labs in July and every 3 months.  Information or immunization was placed in the AVS.  She was advised  to hold Taltz  if she develops an infection and resume after the infection resolves.  Patient initiated Taltz  on 07/02/2021.Previous therapy includes: Tremfya   Pitting of nails-name but still some pitting was noted.  Primary osteoarthritis of both hands-she bilateral PIP and DIP thickening with no synovitis.  Status post right partial knee replacement - Dr. Jerlean Mood on 02/07/2019.  She continues to have chronic discomfort.  S/P left unicompartmental knee replacement - Dr. Jerlean Mood on 03/21/2019.  Chronic pain.  Primary osteoarthritis of both feet-she gives chief intermittent discomfort.  Chronic SI joint pain-continues to have some discomfort in the SI joints.  Family history of psoriatic arthritis - Brother and sister  Fibromyalgia-she has generalized pain and discomfort from fibromyalgia.  Positive tender points are noted.  Need for regular exercise and stretching was discussed.  Vitamin D  deficiency-vitamin D  was 32.04 in May 2025.  Vitamin D  2000 units daily was advised.  Osteopenia of multiple sites-last s DEXA scan was in November 2022.  Other medical problems are listed as follows:   Non-restorative sleep - trazodone  50 mg 1 tablet at bedtime for insomnia.  Prediabetes  Generalized anxiety disorder  OSA (obstructive sleep apnea)  Status post gastric bypass for obesity  Orders: No orders of the defined types were placed in this encounter.  Meds ordered this encounter  Medications   ixekizumab  (TALTZ ) 80 MG/ML pen    Sig: INJECT 80MG  (1 PEN) UNDER THE SKIN EVERY 4 WEEKS    Dispense:  3 mL    Refill:  0    Prescription Type::   Renewal     Follow-Up  Instructions: Return in about 5 months (around 01/25/2024) for Psoriatic arthritis, Osteoarthritis.   Nicholas Bari, MD  Note - This record has been created using Animal nutritionist.  Chart creation errors have been sought, but may not always  have been located. Such creation errors do not reflect on  the standard of medical care.

## 2023-08-18 ENCOUNTER — Encounter: Payer: Self-pay | Admitting: Family Medicine

## 2023-08-18 MED ORDER — BUPROPION HCL ER (XL) 150 MG PO TB24: 150.0000 mg | ORAL_TABLET | Freq: Every day | ORAL | 0 refills | Status: DC

## 2023-08-18 NOTE — Addendum Note (Signed)
 Addended by: Wyn Heater on: 08/18/2023 11:22 AM   Modules accepted: Orders

## 2023-08-20 ENCOUNTER — Ambulatory Visit: Payer: Self-pay | Admitting: Family Medicine

## 2023-08-20 ENCOUNTER — Other Ambulatory Visit (INDEPENDENT_AMBULATORY_CARE_PROVIDER_SITE_OTHER)

## 2023-08-20 ENCOUNTER — Other Ambulatory Visit: Payer: Medicare HMO

## 2023-08-20 DIAGNOSIS — E559 Vitamin D deficiency, unspecified: Secondary | ICD-10-CM | POA: Diagnosis not present

## 2023-08-20 DIAGNOSIS — R7303 Prediabetes: Secondary | ICD-10-CM

## 2023-08-20 DIAGNOSIS — E781 Pure hyperglyceridemia: Secondary | ICD-10-CM | POA: Diagnosis not present

## 2023-08-20 LAB — HEMOGLOBIN A1C: Hgb A1c MFr Bld: 5.4 % (ref 4.6–6.5)

## 2023-08-20 LAB — LIPID PANEL
Cholesterol: 163 mg/dL (ref 0–200)
HDL: 36.2 mg/dL — ABNORMAL LOW (ref >39.00)
LDL Cholesterol: 84 mg/dL (ref 0–99)
NonHDL: 127.03
Total CHOL/HDL Ratio: 5
Triglycerides: 217 mg/dL — ABNORMAL HIGH (ref 0.0–149.0)
VLDL: 43.4 mg/dL — ABNORMAL HIGH (ref 0.0–40.0)

## 2023-08-20 LAB — VITAMIN D 25 HYDROXY (VIT D DEFICIENCY, FRACTURES): VITD: 32.04 ng/mL (ref 30.00–100.00)

## 2023-08-20 NOTE — Progress Notes (Signed)
 No critical labs need to be addressed urgently. We will discuss labs in detail at upcoming office visit.

## 2023-08-24 ENCOUNTER — Ambulatory Visit (INDEPENDENT_AMBULATORY_CARE_PROVIDER_SITE_OTHER): Payer: Medicare HMO

## 2023-08-24 VITALS — Ht 68.0 in | Wt 192.0 lb

## 2023-08-24 DIAGNOSIS — Z0000 Encounter for general adult medical examination without abnormal findings: Secondary | ICD-10-CM

## 2023-08-24 NOTE — Progress Notes (Signed)
 Subjective:   Jillian [name] is a 68 y.o. who presents for a Medicare Wellness preventive visit.  As a reminder, Annual Wellness Visits don't include a physical exam, and some assessments may be limited, especially if this visit is performed virtually. We may recommend an in-person follow-up visit with your provider if needed.  Visit Complete: Virtual I connected with  Jillian [name] on 08/24/23 by a audio enabled telemedicine application and verified that I am speaking with the correct person using two identifiers.  Patient Location: Home  Provider Location: Home Office  I discussed the limitations of evaluation and management by telemedicine. The patient expressed understanding and agreed to proceed.  Vital Signs: Because this visit was a virtual/telehealth visit, some criteria may be missing or patient reported. Any vitals not documented were not able to be obtained and vitals that have been documented are patient reported.  VideoDeclined- This patient declined Librarian, academic. Therefore the visit was completed with audio only.  Persons Participating in Visit: Patient.  AWV Questionnaire: No: Patient Medicare AWV questionnaire was not completed prior to this visit.  Cardiac Risk Factors include: advanced age (>55men, >68 women)     Objective:     Today's Vitals   08/24/23 1421 08/24/23 1422  Weight: 192 lb (87.1 kg)   Height: 5\' 8"  (1.727 m)   PainSc:  5    Body mass index is 29.19 kg/m.     08/24/2023    2:34 PM 12/29/2022   10:23 AM 12/15/2022    8:11 AM 10/21/2022    8:41 AM 08/20/2022    9:30 AM 06/03/2021    6:00 PM 02/05/2021   12:23 PM  Advanced Directives  Does Patient Have a Medical Advance Directive? No No No No No No No  Would patient like information on creating a medical advance directive?  No - Patient declined No - Patient declined  No - Patient declined No - Patient declined No - Patient declined    Current Medications  (verified) Outpatient Encounter Medications as of 08/24/2023  Medication Sig   acetaminophen  (TYLENOL ) 500 MG tablet Take 500 mg by mouth every 6 (six) hours as needed.   augmented betamethasone  dipropionate (DIPROLENE -AF) 0.05 % ointment Apply topically 2 (two) times daily as needed.   buPROPion  (WELLBUTRIN  XL) 150 MG 24 hr tablet Take 1 tablet (150 mg total) by mouth daily.   Calcium Carbonate (CALCIUM 500 PO) Take 1,000 mg by mouth daily.   cholecalciferol  (VITAMIN D3) 25 MCG (1000 UNIT) tablet Take 1,000 Units by mouth daily.   cyclobenzaprine (FLEXERIL) 5 MG tablet TAKE 1 TABLET DAILY AS     NEEDED FOR MUSCLE SPASMS   DULoxetine  (CYMBALTA ) 30 MG capsule TAKE 3 CAPSULES BY MOUTH EVERY DAY   fluocinonide  (LIDEX ) 0.05 % external solution Apply 1 Application topically 2 (two) times daily as needed (PSORIASIS).   ibuprofen (ADVIL) 200 MG tablet Take 200 mg by mouth every 6 (six) hours as needed.   ixekizumab  (TALTZ ) 80 MG/ML pen INJECT 80MG  (1 PEN) UNDER THE SKIN EVERY 4 WEEKS   Multiple Vitamins-Minerals (BARIATRIC MULTIVITAMINS/IRON PO) Take 1 capsule by mouth daily.   nystatin  (MYCOSTATIN ) 100000 UNIT/ML suspension Take 5 mLs (500,000 Units total) by mouth 4 (four) times daily.   pregabalin  (LYRICA ) 150 MG capsule Take 1 capsule (150 mg total) by mouth in the morning, at noon, and at bedtime.   senna-docusate (SENOKOT S) 8.6-50 MG tablet Take 1 tablet by mouth daily.   traMADol  (  ULTRAM ) 50 MG tablet Take 1 tablet (50 mg total) by mouth every 8 (eight) hours as needed.   traMADol  (ULTRAM ) 50 MG tablet Take 1 tablet (50 mg total) by mouth every 8 (eight) hours as needed.   traZODone  (DESYREL ) 50 MG tablet Take 1 tablet (50 mg total) by mouth at bedtime as needed for sleep.   No facility-administered encounter medications on file as of 08/24/2023.    Allergies (verified) Patient has no known allergies.   History: Past Medical History:  Diagnosis Date   Anxiety    Cancer (HCC)    basal  cell 2016   Dermatitis    per patient    Fibromyalgia    Prediabetes 12/03/2017   Primary localized osteoarthritis of right knee 02/07/2019   Sleep apnea    uses CPAP   Thrush    UTI (urinary tract infection)    Past Surgical History:  Procedure Laterality Date   BIOPSY  02/05/2021   Procedure: BIOPSY;  Surgeon: Junie Olds, MD;  Location: [facility];  Service: General;;   CATARACT EXTRACTION W/PHACO Left 12/15/2022   Procedure: CATARACT EXTRACTION PHACO AND INTRAOCULAR LENS PLACEMENT (IOC) LEFT MALYUGIN  CLAREON VIVITY LENS 3.03 00:22.3;  Surgeon: Clair Crews, MD;  Location: [facility];  Service: Ophthalmology;  Laterality: Left;   CATARACT EXTRACTION W/PHACO Right 12/29/2022   Procedure: CATARACT EXTRACTION PHACO AND INTRAOCULAR LENS PLACEMENT (IOC) RIGHT MALYUGIN  CLAREON VIVITY LENS 3.15 00:21.2;  Surgeon: Clair Crews, MD;  Location: [facility];  Service: Ophthalmology;  Laterality: Right;   CESAREAN SECTION     times 2   COLONOSCOPY WITH PROPOFOL  N/A 10/21/2022   Procedure: COLONOSCOPY WITH PROPOFOL ;  Surgeon: Selena Daily, MD;  [facility];  Service: Gastroenterology;  Laterality: N/A;   ESOPHAGOGASTRODUODENOSCOPY N/A 02/05/2021   Procedure: ESOPHAGOGASTRODUODENOSCOPY (EGD);  Surgeon: Junie Olds, MD;  Location: [facility];  Service: General;  Laterality: N/A;   EYE SURGERY Bilateral 04/24/2020   bilateral eyelids    GASTRIC BYPASS  06/03/2021   HERNIA REPAIR     PARTIAL KNEE ARTHROPLASTY Right 02/07/2019   Procedure: UNICOMPARTMENTAL KNEE;  Surgeon: Osa Blase, MD;  Location: [facility];  Service: Orthopedics;  Laterality: Right;   PARTIAL KNEE ARTHROPLASTY Left 03/21/2019   Procedure: UNICOMPARTMENTAL KNEE;  Surgeon: Osa Blase, MD;  Location: [facility];  Service: Orthopedics;  Laterality: Left;   UPPER GI ENDOSCOPY  03/2021   UPPER GI ENDOSCOPY N/A 06/03/2021   Procedure: UPPER GI ENDOSCOPY;  Surgeon:  Junie Olds, MD;  Location: [facility];  Service: General;  Laterality: N/A;   Family History  Problem Relation Age of Onset   Cancer Father        colon   Hyperlipidemia Father    Hypertension Father    Diabetes Sister    Psoriasis Sister    Psoriasis Brother    Arthritis Brother    Osteoarthritis Brother    Diabetes Brother    Stroke Brother    Throat cancer Brother    Cancer Maternal Grandmother        colon   Hypertension Maternal Grandmother    Anuerysm Paternal Grandfather    Healthy Daughter    Healthy Son    Cancer Paternal Aunt        colon   Breast cancer Neg Hx    Social History   Socioeconomic History   Marital status: Married    Spouse name: Not on file   Number of children: 2  Years of education: Not on file   Highest education level: Not on file  Occupational History   Occupation: RN  Tobacco Use   Smoking status: Former    Current packs/day: 0.00    Average packs/day: 1 pack/day for 15.0 years (15.0 ttl pk-yrs)    Types: Cigarettes    Start date: 03/30/1977    Quit date: 03/30/1992    Years since quitting: 31.4    Passive exposure: Past   Smokeless tobacco: Never  Vaping Use   Vaping status: Never Used  Substance and Sexual Activity   Alcohol use: No   Drug use: No   Sexual activity: Not on file  Other Topics Concern   Not on file  Social History Narrative   Regular exercise-yes, but seasonal walks and swims   Diet: healthy, low carbohydrates, no caffeine   Social Drivers of Corporate investment banker Strain: Low Risk  (08/24/2023)   Overall Financial Resource Strain (CARDIA)    Difficulty of Paying Living Expenses: Not hard at all  Food Insecurity: No Food Insecurity (08/24/2023)   Hunger Vital Sign    Worried About Running Out of Food in the Last Year: Never true    Ran Out of Food in the Last Year: Never true  Transportation Needs: No Transportation Needs (08/24/2023)   PRAPARE - Administrator, Civil Service  (Medical): No    Lack of Transportation (Non-Medical): No  Physical Activity: Insufficiently Active (08/24/2023)   Exercise Vital Sign    Days of Exercise per Week: 2 days    Minutes of Exercise per Session: 60 min  Stress: No Stress Concern Present (08/24/2023)   [employer] of Occupational Health - Occupational Stress Questionnaire    Feeling of Stress : Only a little  Social Connections: Socially Integrated (08/24/2023)   Social Connection and Isolation Panel [NHANES]    Frequency of Communication with Friends and Family: More than three times a week    Frequency of Social Gatherings with Friends and Family: More than three times a week    Attends Religious Services: More than 4 times per year    Active Member of [employer] or Organizations: Yes    Attends Engineer, structural: More than 4 times per year    Marital Status: Married    Tobacco Counseling Counseling given: Not Answered    Clinical Intake:  Pre-visit preparation completed: Yes  Pain : 0-10 Pain Score: 5  Pain Type: Chronic pain Pain Location: Leg (both leg and pelvic) Pain Descriptors / Indicators: Aching Pain Onset: More than a month ago Pain Frequency: Intermittent Pain Relieving Factors: IBU;exercises Effect of Pain on Daily Activities: times have to rest  Pain Relieving Factors: IBU;exercises  BMI - recorded: 29.19 Nutritional Status: BMI 25 -29 Overweight Nutritional Risks: None Diabetes: No  Lab Results  Component Value Date   HGBA1C 5.4 08/20/2023   HGBA1C 5.4 08/17/2022   HGBA1C 5.6 09/19/2021     How often do you need to have someone help you when you read instructions, pamphlets, or other written materials from your doctor or pharmacy?: 1 - Never  Interpreter Needed?: No  Comments: lives with husband Information entered by :: B.Nthony Lefferts,LPN   Activities of Daily Living     08/24/2023    2:34 PM 12/29/2022   10:19 AM  In your present state of health, do you have any  difficulty performing the following activities:  Hearing? 1 0  Vision? 0 0  Difficulty concentrating or  making decisions? 1 0  Walking or climbing stairs? 0 0  Dressing or bathing? 0 0  Doing errands, shopping? 0   Preparing Food and eating ? N   Using the Toilet? N   In the past six months, have you accidently leaked urine? N   Do you have problems with loss of bowel control? N   Managing your Medications? N   Managing your Finances? N   Housekeeping or managing your Housekeeping? N     Patient Care Team: Judithann Novas, MD as PCP - General  Indicate any recent Medical Services you may have received from other than [facility] providers in the past year (date may be approximate).     Assessment:    This is a routine wellness examination for Jillian [name].  Hearing/Vision screen Hearing Screening - Comments:: Pt says hearing is not as good Vision Screening - Comments:: Pt says her vision w/glasses Dr Pearle Bottoms surgery this year   Goals Addressed             This Visit's Progress    Maintain optimal adherence to therapy   On track    Patient is on track. Patient will maintain adherence, adhere to provider and/or lab appointments, and be monitored by provider to determine if a change in treatment plan is warranted      Patient Stated   On track    08/24/23-Exercise more, get 10000 steps a day and exercise a 3rd day       Depression Screen     08/24/2023    2:31 PM 08/25/2022   10:24 AM 08/19/2021    9:31 AM 10/16/2020   11:11 AM 08/16/2020    9:40 AM 03/10/2019    3:32 PM 12/03/2017    3:40 PM  PHQ 2/9 Scores  PHQ - 2 Score 0 0 0 0 0 0 0    Fall Risk     08/24/2023    2:28 PM 08/20/2022    9:31 AM 08/19/2021    9:31 AM 10/16/2020   11:10 AM 08/16/2020    9:36 AM  Fall Risk   Falls in the past year? 0 0 0 0 1  Number falls in past yr: 0 0   0  Injury with Fall? 0 0   1  Risk for fall due to : No Fall Risks No Fall Risks     Follow up Education provided;Falls  prevention discussed Falls prevention discussed;Falls evaluation completed       MEDICARE RISK AT HOME:  Medicare Risk at Home Any stairs in or around the home?: No If so, are there any without handrails?: No Home free of loose throw rugs in walkways, pet beds, electrical cords, etc?: Yes Adequate lighting in your home to reduce risk of falls?: Yes Life alert?: No Use of a cane, walker or w/c?: No Grab bars in the bathroom?: Yes Shower chair or bench in shower?: No Elevated toilet seat or a handicapped toilet?: No  TIMED UP AND GO:  Was the test performed?  No  Cognitive Function: 6CIT completed        08/24/2023    2:39 PM 08/20/2022    9:32 AM  6CIT Screen  What Year? 0 points 0 points  What month? 0 points 0 points  What time? 0 points 0 points  Count back from 20 0 points 0 points  Months in reverse 0 points 0 points  Repeat phrase 0 points 2 points  Total Score 0 points 2  points    Immunizations Immunization History  Administered Date(s) Administered   Influenza-Unspecified 01/01/2020, 12/28/2020, 12/28/2021   Moderna Sars-Covid-2 Vaccination 05/23/2019, 07/04/2019   PNEUMOCOCCAL CONJUGATE-20 08/16/2020   Td 03/30/2004   Tdap 10/28/2015   Zoster Recombinant(Shingrix) 07/09/2021, 08/03/2022   Zoster, Live 08/30/2014    Screening Tests Health Maintenance  Topic Date Due   COVID-19 Vaccine (3 - Moderna risk series) 08/01/2019   DEXA SCAN  01/30/2023   INFLUENZA VACCINE  10/29/2023   MAMMOGRAM  07/28/2024   Medicare Annual Wellness (AWV)  08/23/2024   DTaP/Tdap/Td (3 - Td or Tdap) 10/27/2025   Colonoscopy  10/21/2027   Pneumonia Vaccine 67+ Years old  Completed   Hepatitis C Screening  Completed   Zoster Vaccines- Shingrix  Completed   HPV VACCINES  Aged Out   Meningococcal B Vaccine  Aged Out    Health Maintenance  Health Maintenance Due  Topic Date Due   COVID-19 Vaccine (3 - Moderna risk series) 08/01/2019   DEXA SCAN  01/30/2023   Health  Maintenance Items Addressed: None needed at this time  Additional Screening:  Vision Screening: Recommended annual ophthalmology exams for early detection of glaucoma and other disorders of the eye.  Dental Screening: Recommended annual dental exams for proper oral hygiene  Community Resource Referral / Chronic Care Management: CRR required this visit?  No   CCM required this visit?  No   Plan:    I have personally reviewed and noted the following in the patient's chart:   Medical and social history Use of alcohol, tobacco or illicit drugs  Current medications and supplements including opioid prescriptions. Patient is not currently taking opioid prescriptions. Functional ability and status Nutritional status Physical activity Advanced directives List of other physicians Hospitalizations, surgeries, and ER visits in previous 12 months Vitals Screenings to include cognitive, depression, and falls Referrals and appointments  In addition, I have reviewed and discussed with patient certain preventive protocols, quality metrics, and best practice recommendations. A written personalized care plan for preventive services as well as general preventive health recommendations were provided to patient.   Nerissa Bannister, LPN   1/61/0960   After Visit Summary: (MyChart) Due to this being a telephonic visit, the after visit summary with patients personalized plan was offered to patient via MyChart   Notes: Nothing significant to report at this time.

## 2023-08-25 ENCOUNTER — Ambulatory Visit: Payer: Medicare HMO | Attending: Rheumatology | Admitting: Rheumatology

## 2023-08-25 ENCOUNTER — Other Ambulatory Visit: Payer: Self-pay

## 2023-08-25 ENCOUNTER — Other Ambulatory Visit: Payer: Self-pay | Admitting: Pharmacy Technician

## 2023-08-25 ENCOUNTER — Encounter: Payer: Self-pay | Admitting: Rheumatology

## 2023-08-25 VITALS — BP 113/74 | HR 80 | Resp 14 | Ht 68.0 in | Wt 194.8 lb

## 2023-08-25 DIAGNOSIS — M19041 Primary osteoarthritis, right hand: Secondary | ICD-10-CM

## 2023-08-25 DIAGNOSIS — Z79899 Other long term (current) drug therapy: Secondary | ICD-10-CM

## 2023-08-25 DIAGNOSIS — M533 Sacrococcygeal disorders, not elsewhere classified: Secondary | ICD-10-CM | POA: Diagnosis not present

## 2023-08-25 DIAGNOSIS — M8589 Other specified disorders of bone density and structure, multiple sites: Secondary | ICD-10-CM

## 2023-08-25 DIAGNOSIS — L409 Psoriasis, unspecified: Secondary | ICD-10-CM | POA: Diagnosis not present

## 2023-08-25 DIAGNOSIS — Z96651 Presence of right artificial knee joint: Secondary | ICD-10-CM | POA: Diagnosis not present

## 2023-08-25 DIAGNOSIS — B370 Candidal stomatitis: Secondary | ICD-10-CM

## 2023-08-25 DIAGNOSIS — Z96652 Presence of left artificial knee joint: Secondary | ICD-10-CM | POA: Diagnosis not present

## 2023-08-25 DIAGNOSIS — Z840 Family history of diseases of the skin and subcutaneous tissue: Secondary | ICD-10-CM | POA: Diagnosis not present

## 2023-08-25 DIAGNOSIS — M19071 Primary osteoarthritis, right ankle and foot: Secondary | ICD-10-CM

## 2023-08-25 DIAGNOSIS — E559 Vitamin D deficiency, unspecified: Secondary | ICD-10-CM | POA: Diagnosis not present

## 2023-08-25 DIAGNOSIS — M19042 Primary osteoarthritis, left hand: Secondary | ICD-10-CM

## 2023-08-25 DIAGNOSIS — G478 Other sleep disorders: Secondary | ICD-10-CM

## 2023-08-25 DIAGNOSIS — G4733 Obstructive sleep apnea (adult) (pediatric): Secondary | ICD-10-CM

## 2023-08-25 DIAGNOSIS — F411 Generalized anxiety disorder: Secondary | ICD-10-CM

## 2023-08-25 DIAGNOSIS — M19072 Primary osteoarthritis, left ankle and foot: Secondary | ICD-10-CM

## 2023-08-25 DIAGNOSIS — M797 Fibromyalgia: Secondary | ICD-10-CM | POA: Diagnosis not present

## 2023-08-25 DIAGNOSIS — Z9884 Bariatric surgery status: Secondary | ICD-10-CM

## 2023-08-25 DIAGNOSIS — L4050 Arthropathic psoriasis, unspecified: Secondary | ICD-10-CM | POA: Diagnosis not present

## 2023-08-25 DIAGNOSIS — L608 Other nail disorders: Secondary | ICD-10-CM | POA: Diagnosis not present

## 2023-08-25 DIAGNOSIS — R7303 Prediabetes: Secondary | ICD-10-CM

## 2023-08-25 DIAGNOSIS — G8929 Other chronic pain: Secondary | ICD-10-CM

## 2023-08-25 MED ORDER — TALTZ 80 MG/ML ~~LOC~~ SOAJ
SUBCUTANEOUS | 0 refills | Status: DC
  Filled 2023-08-25: qty 1, 28d supply, fill #0
  Filled 2023-09-20: qty 1, 28d supply, fill #1
  Filled 2023-10-14 – 2023-10-22 (×2): qty 1, 28d supply, fill #2

## 2023-08-25 NOTE — Progress Notes (Signed)
 Specialty Pharmacy Refill Coordination Note  Jillian [name] is a 68 y.o. adult contacted today regarding refills of specialty medication(s) Ixekizumab  (Taltz )   Patient requested Delivery   Delivery date: 08/27/23   Verified address: [address]  [place]   Medication will be filled on 08/26/23.

## 2023-08-25 NOTE — Patient Instructions (Signed)
 Standing Labs We placed an order today for your standing lab work.   Please have your standing labs drawn in July and every 3 months  Please have your labs drawn 2 weeks prior to your appointment so that the provider can discuss your lab results at your appointment, if possible.  Please note that you may see your imaging and lab results in MyChart before we have reviewed them. We will contact you once all results are reviewed. Please allow our office up to 72 hours to thoroughly review all of the results before contacting the office for clarification of your results.  WALK-IN LAB HOURS  Monday through Thursday from 8:00 am -12:30 pm and 1:00 pm-4:00 pm and Friday from 8:00 am-12:00 pm.  Patients with office visits requiring labs will be seen before walk-in labs.  You may encounter longer than normal wait times. Please allow additional time. Wait times may be shorter on  Monday and Thursday afternoons.  We do not book appointments for walk-in labs. We appreciate your patience and understanding with our staff.   Labs are drawn by [facility]. Please bring your co-pay at the time of your lab draw.  You may receive a bill from [facility] for your lab work.  Please note if you are on Hydroxychloroquine and and an order has been placed for a Hydroxychloroquine level,  you will need to have it drawn 4 hours or more after your last dose.  If you wish to have your labs drawn at another location, please call the office 24 hours in advance so we can fax the orders.  The office is located at [address], [place], [place] [address]   If you have any questions regarding directions or hours of operation,  please call [phone].   As a reminder, please drink plenty of water prior to coming for your lab work. Thanks!   Vaccines You are taking a medication(s) that can suppress your immune system.  The following immunizations are recommended: Flu annually Covid-19  RSV Td/Tdap (tetanus,  diphtheria, pertussis) every 10 years Pneumonia (Prevnar 15 then Pneumovax 23 at least 1 year apart.  Alternatively, can take Prevnar 20 without needing additional dose) Shingrix: 2 doses from 4 weeks to 6 months apart  Please check with your PCP to make sure you are up to date.   If you have signs or symptoms of an infection or start antibiotics: First, call your PCP for workup of your infection. Hold your medication through the infection, until you complete your antibiotics, and until symptoms resolve if you take the following: Injectable medication (Actemra, Benlysta, Cimzia, Cosentyx, Enbrel, Humira, Kevzara, Orencia, Remicade, Simponi, Stelara, Taltz, Tremfya) Methotrexate  Leflunomide (Arava) Mycophenolate (Cellcept) Xeljanz, Olumiant, or Rinvoq

## 2023-08-26 ENCOUNTER — Other Ambulatory Visit: Payer: Self-pay

## 2023-08-26 ENCOUNTER — Other Ambulatory Visit (HOSPITAL_COMMUNITY): Payer: Self-pay

## 2023-08-27 ENCOUNTER — Ambulatory Visit (INDEPENDENT_AMBULATORY_CARE_PROVIDER_SITE_OTHER): Payer: Medicare HMO | Admitting: Family Medicine

## 2023-08-27 ENCOUNTER — Encounter: Payer: Self-pay | Admitting: Family Medicine

## 2023-08-27 VITALS — BP 92/60 | HR 83 | Temp 98.4°F | Ht 67.5 in | Wt 190.5 lb

## 2023-08-27 DIAGNOSIS — E781 Pure hyperglyceridemia: Secondary | ICD-10-CM

## 2023-08-27 DIAGNOSIS — E559 Vitamin D deficiency, unspecified: Secondary | ICD-10-CM

## 2023-08-27 DIAGNOSIS — M797 Fibromyalgia: Secondary | ICD-10-CM

## 2023-08-27 DIAGNOSIS — R7303 Prediabetes: Secondary | ICD-10-CM

## 2023-08-27 DIAGNOSIS — Z0000 Encounter for general adult medical examination without abnormal findings: Secondary | ICD-10-CM | POA: Diagnosis not present

## 2023-08-27 DIAGNOSIS — M8580 Other specified disorders of bone density and structure, unspecified site: Secondary | ICD-10-CM

## 2023-08-27 DIAGNOSIS — L4050 Arthropathic psoriasis, unspecified: Secondary | ICD-10-CM

## 2023-08-27 MED ORDER — PREGABALIN 150 MG PO CAPS: 150.0000 mg | ORAL_CAPSULE | Freq: Two times a day (BID) | ORAL | Status: DC

## 2023-08-27 MED ORDER — TRAZODONE HCL 50 MG PO TABS: 100.0000 mg | ORAL_TABLET | Freq: Every evening | ORAL | 1 refills | Status: DC | PRN

## 2023-08-27 MED ORDER — CYCLOBENZAPRINE HCL 5 MG PO TABS: 5.0000 mg | ORAL_TABLET | Freq: Every day | ORAL | 0 refills | Status: DC

## 2023-08-27 NOTE — Progress Notes (Signed)
 [facility]   Patient ID: Jillian [name], adult    DOB: 08/30/1955, 68 y.o.   MRN: [id number]  This visit was conducted in person.  BP 92/60   Pulse 83   Temp 98.4 F (36.9 C) (Temporal)   Ht 5' 7.5" (1.715 m)   Wt 190 lb 8 oz (86.4 kg)   SpO2 97%   BMI 29.40 kg/m    CC:  Chief Complaint  Patient presents with   Annual Exam    Part 2 (MWV 08/24/2023)    Subjective:   HPI: Jillian [name] is a 68 y.o. adult presenting on 08/27/2023 for Annual Exam (Part 2 (MWV 08/24/2023))   The patient presents for complete physical and review of chronic health problems. He/She also has the following acute concerns today: none  The patient saw a LPN or RN for medicare wellness visit. 07/2723  Prevention and wellness was reviewed in detail. Note reviewed and important notes copied below.   Advance directives and end of life planning reviewed in detail with patient and documented in EMR. Patient given handout on advance care directives if needed. HCPOA and living will updated if needed.    S/P sleeve gastrectomy in 05/2021, Starting weight 246  She is on bariatric vitamin with iron. Walking daily and working on building muscle.  Body mass index is 29.4 kg/m. Wt Readings from Last 3 Encounters:  08/27/23 190 lb 8 oz (86.4 kg)  08/25/23 194 lb 12.8 oz (88.4 kg)  08/24/23 192 lb (87.1 kg)    Psoriatic arthritis, fibromyalgia: followed by rheum.  Was off Talz. Given injection, now back on.She would like to decrease tramadol  to 1-2 daily as control is good. She  will continue cymbalta  90 mg daily, lyrica  150 mg TID On 2400 mg daily of ibuprofen.  Prediabetes  Lab Results  Component Value Date   HGBA1C 5.4 08/20/2023   Vit D: good control on supplement . S/P gastric sleeve.   Likely  HDL lower given less activity with infections in last year. Back to silver sneakers. Lab Results  Component Value Date   CHOL 163 08/20/2023   HDL 36.20 (L) 08/20/2023   LDLCALC 84 08/20/2023    LDLDIRECT 94.0 08/12/2020   TRIG 217.0 (H) 08/20/2023   CHOLHDL 5 08/20/2023    The 10-year ASCVD risk score (Arnett DK, et al., 2019) is: 4.7%   Values used to calculate the score:     Age: 68 years     Sex: Female     Is Non-Hispanic African American: No     Diabetic: No     Tobacco smoker: No     Systolic Blood Pressure: 92 mmHg     Is BP treated: No     HDL Cholesterol: 36.2 mg/dL     Total Cholesterol: 163 mg/dL      Relevant past medical, surgical, family and social history reviewed and updated as indicated. Interim medical history since our last visit reviewed. Allergies and medications reviewed and updated. Outpatient Medications Prior to Visit  Medication Sig Dispense Refill   acetaminophen  (TYLENOL ) 500 MG tablet Take 500 mg by mouth every 6 (six) hours as needed.     augmented betamethasone  dipropionate (DIPROLENE -AF) 0.05 % ointment Apply topically 2 (two) times daily as needed. 50 g 2   buPROPion  (WELLBUTRIN  XL) 150 MG 24 hr tablet Take 1 tablet (150 mg total) by mouth daily. 100 tablet 0   Calcium Carbonate (CALCIUM 500 PO) Take 1,000 mg by mouth  daily.     cholecalciferol  (VITAMIN D3) 25 MCG (1000 UNIT) tablet Take 1,000 Units by mouth daily.     DULoxetine  (CYMBALTA ) 30 MG capsule TAKE 3 CAPSULES BY MOUTH EVERY DAY 270 capsule 0   fluocinonide  (LIDEX ) 0.05 % external solution Apply 1 Application topically 2 (two) times daily as needed (PSORIASIS). 60 mL 2   ibuprofen (ADVIL) 200 MG tablet Take 200 mg by mouth every 6 (six) hours as needed.     ixekizumab  (TALTZ ) 80 MG/ML pen INJECT 80MG  (1 PEN) UNDER THE SKIN EVERY 4 WEEKS 3 mL 0   Multiple Vitamins-Minerals (BARIATRIC MULTIVITAMINS/IRON PO) Take 1 capsule by mouth daily.     senna-docusate (SENOKOT S) 8.6-50 MG tablet Take 1 tablet by mouth daily.     traMADol  (ULTRAM ) 50 MG tablet Take 1 tablet (50 mg total) by mouth every 8 (eight) hours as needed. 180 tablet 0   cyclobenzaprine  (FLEXERIL ) 5 MG tablet TAKE 1  TABLET DAILY AS     NEEDED FOR MUSCLE SPASMS 30 tablet 0   pregabalin  (LYRICA ) 150 MG capsule Take 1 capsule (150 mg total) by mouth in the morning, at noon, and at bedtime. 90 capsule 3   traZODone  (DESYREL ) 50 MG tablet Take 1 tablet (50 mg total) by mouth at bedtime as needed for sleep. 90 tablet 1   nystatin  (MYCOSTATIN ) 100000 UNIT/ML suspension Take 5 mLs (500,000 Units total) by mouth 4 (four) times daily. (Patient not taking: Reported on 08/25/2023) 60 mL 0   No facility-administered medications prior to visit.     Per HPI unless specifically indicated in ROS section below Review of Systems  Constitutional:  Negative for fatigue and fever.  HENT:  Negative for ear pain.   Eyes:  Negative for pain.  Respiratory:  Negative for cough and shortness of breath.   Cardiovascular:  Negative for chest pain, palpitations and leg swelling.  Gastrointestinal:  Negative for abdominal pain.  Genitourinary:  Negative for dysuria.  Musculoskeletal:  Negative for arthralgias.  Neurological:  Negative for syncope, light-headedness and headaches.  Psychiatric/Behavioral:  Negative for dysphoric mood.    Objective:  BP 92/60   Pulse 83   Temp 98.4 F (36.9 C) (Temporal)   Ht 5' 7.5" (1.715 m)   Wt 190 lb 8 oz (86.4 kg)   SpO2 97%   BMI 29.40 kg/m   Wt Readings from Last 3 Encounters:  08/27/23 190 lb 8 oz (86.4 kg)  08/25/23 194 lb 12.8 oz (88.4 kg)  08/24/23 192 lb (87.1 kg)      Physical Exam Vitals and nursing note reviewed.  Constitutional:      General: She is not in acute distress.    Appearance: Normal appearance. She is well-developed. She is not ill-appearing or toxic-appearing.  HENT:     Head: Normocephalic.     Right Ear: Hearing, tympanic membrane, ear canal and external ear normal.     Left Ear: Hearing, tympanic membrane, ear canal and external ear normal.     Nose: Nose normal.  Eyes:     General: Lids are normal. Lids are everted, no foreign bodies appreciated.      Conjunctiva/sclera: Conjunctivae normal.     Pupils: Pupils are equal, round, and reactive to light.  Neck:     Thyroid : No thyroid  mass or thyromegaly.     Vascular: No carotid bruit.     Trachea: Trachea normal.  Cardiovascular:     Rate and Rhythm: Normal rate and regular rhythm.  Heart sounds: Normal heart sounds, S1 normal and S2 normal. No murmur heard.    No gallop.  Pulmonary:     Effort: Pulmonary effort is normal. No respiratory distress.     Breath sounds: Normal breath sounds. No wheezing, rhonchi or rales.  Abdominal:     General: Bowel sounds are normal. There is no distension or abdominal bruit.     Palpations: Abdomen is soft. There is no fluid wave or mass.     Tenderness: There is no abdominal tenderness. There is no guarding or rebound.     Hernia: No hernia is present.  Musculoskeletal:     Cervical back: Normal range of motion and neck supple.  Lymphadenopathy:     Cervical: No cervical adenopathy.  Skin:    General: Skin is warm and dry.     Findings: No rash.  Neurological:     Mental Status: She is alert.     Cranial Nerves: No cranial nerve deficit.     Sensory: No sensory deficit.  Psychiatric:        Mood and Affect: Mood is not anxious or depressed.        Speech: Speech normal.        Behavior: Behavior normal. Behavior is cooperative.        Judgment: Judgment normal.       Results for orders placed or performed in visit on 08/20/23  VITAMIN D  25 Hydroxy (Vit-D Deficiency, Fractures)   Collection Time: 08/20/23 12:35 PM  Result Value Ref Range   VITD 32.04 30.00 - 100.00 ng/mL  Lipid panel   Collection Time: 08/20/23 12:35 PM  Result Value Ref Range   Cholesterol 163 0 - 200 mg/dL   Triglycerides 811.9 (H) 0.0 - 149.0 mg/dL   HDL 14.78 (L) >29.56 mg/dL   VLDL 21.3 (H) 0.0 - 08.6 mg/dL   LDL Cholesterol 84 0 - 99 mg/dL   Total CHOL/HDL Ratio 5    NonHDL 127.03   Hemoglobin A1c   Collection Time: 08/20/23 12:35 PM  Result Value Ref  Range   Hgb A1c MFr Bld 5.4 4.6 - 6.5 %     COVID 19 screen:  No recent travel or known exposure to COVID19 The patient denies respiratory symptoms of COVID 19 at this time. The importance of social distancing was discussed today.   Assessment and Plan   The patient's preventative maintenance and recommended screening tests for an annual wellness exam were reviewed in full today. Brought up to date unless services declined.  Counselled on the importance of diet, exercise, and its role in overall health and mortality. The patient's FH and SH was reviewed, including their home life, tobacco status, and drug and alcohol status.   Vaccines: Uptodate  Tdap,  given PNA 20 and shingrix   Mammo:07/2022, q 2 years DVE/pap:nml pap/dve nml , neg co-testing 2017,   no further indicated Colon: Father with colon cancer age 48.. Colonoscopy 12/26/2010: no polyps  09/2016  Incomplete prep... Repeated 09/2017. 09/2022 Dr. Felicita Horns. Plan repeat in 5 years given family history Nonsmoker  Bone density: 01/2021  osteopenia -1.1.Aaron AasAaron Aas repeat due   Nonsmoker  Hep C: neg     Routine general medical examination at a health care facility  HYPERTRIGLYCERIDEMIA Assessment & Plan: Lab Results  Component Value Date   CHOL 163 08/20/2023   HDL 36.20 (L) 08/20/2023   LDLCALC 84 08/20/2023   LDLDIRECT 94.0 08/12/2020   TRIG 217.0 (H) 08/20/2023   CHOLHDL 5 08/20/2023  Resolved S/P sleeve gastrectomy   Prediabetes Assessment & Plan: Resolved s/p gastric sleeve.   Fibromyalgia Assessment & Plan: Chronic,  followed by Dr. Alvira Josephs.   Lyrica  back up to 150 mg 3 times a day but did not help.. back down to BID.  She will continue tramadol  50 mg 3 times a day and Cymbalta  90 mg daily.  She is also using Wellbutrin  150 mg XL p.o. daily and trazodone  for sleep.     Vitamin D  deficiency Assessment & Plan: Stable, chronic.  Continue current medication.    Increae to vit D 3 daily 1-2 K,  S/P sleeve  gastrectomy continue high dose   Psoriatic arthritis (HCC) Assessment & Plan:  Chronic, recent flare per rheum likely main cause of pain.  Has first dose to restart Talz today.   Osteopenia, unspecified location -     DG Bone Density; Future  Other orders -     traZODone  HCl; Take 2 tablets (100 mg total) by mouth at bedtime as needed for sleep.  Dispense: 180 tablet; Refill: 1 -     Pregabalin ; Take 1 capsule (150 mg total) by mouth 2 (two) times daily. -     Cyclobenzaprine HCl; Take 1 tablet (5 mg total) by mouth at bedtime.  Dispense: 30 tablet; Refill: 0    Herby Lolling, MD

## 2023-08-27 NOTE — Assessment & Plan Note (Signed)
Resolved s/p gastric sleeve.

## 2023-08-27 NOTE — Assessment & Plan Note (Addendum)
 Chronic,  followed by Dr. Alvira Josephs.   Lyrica  back up to 150 mg 3 times a day but did not help.. back down to BID.  She will continue tramadol  50 mg 3 times a day and Cymbalta  90 mg daily.  She is also using Wellbutrin  150 mg XL p.o. daily and trazodone  for sleep.

## 2023-08-27 NOTE — Assessment & Plan Note (Addendum)
 Chronic, recent flare per rheum likely main cause of pain.  Has first dose to restart Talz today.

## 2023-08-27 NOTE — Assessment & Plan Note (Signed)
 Lab Results  Component Value Date   CHOL 163 08/20/2023   HDL 36.20 (L) 08/20/2023   LDLCALC 84 08/20/2023   LDLDIRECT 94.0 08/12/2020   TRIG 217.0 (H) 08/20/2023   CHOLHDL 5 08/20/2023   Resolved S/P sleeve gastrectomy

## 2023-08-27 NOTE — Assessment & Plan Note (Addendum)
 Stable, chronic.  Continue current medication.    Increae to vit D 3 daily 1-2 K,  S/P sleeve gastrectomy continue high dose

## 2023-09-17 ENCOUNTER — Other Ambulatory Visit: Payer: Self-pay

## 2023-09-18 ENCOUNTER — Encounter (INDEPENDENT_AMBULATORY_CARE_PROVIDER_SITE_OTHER): Payer: Self-pay

## 2023-09-20 ENCOUNTER — Other Ambulatory Visit: Payer: Self-pay

## 2023-09-20 NOTE — Progress Notes (Signed)
 Specialty Pharmacy Refill Coordination Note  Jillian [name] is a 68 y.o. adult contacted today regarding refills of specialty medication(s) Ixekizumab  (Taltz )   Patient requested Delivery   Delivery date: 09/21/23   Verified address: [address]  [place]   Medication will be filled on 06.23.25.

## 2023-09-21 ENCOUNTER — Other Ambulatory Visit: Payer: Self-pay | Admitting: Family Medicine

## 2023-09-21 MED ORDER — TRAMADOL HCL 50 MG PO TABS: 50.0000 mg | ORAL_TABLET | Freq: Three times a day (TID) | ORAL | 0 refills | Status: DC | PRN

## 2023-09-21 NOTE — Telephone Encounter (Signed)
 Last office visit 08/27/2023 for CPE.  Last refilled 08/27/2023 for #180 with 1 refill.  Next Appt: CPE 08/30/2024.

## 2023-09-21 NOTE — Telephone Encounter (Unsigned)
 Copied from CRM [id number]. Topic: Clinical - Medication Refill >> Sep 21, 2023 10:01 AM Gennette ORN wrote: Medication:  traMADol  (ULTRAM ) 50 MG tablet    Has the patient contacted their pharmacy? Yes (Agent: If no, request that the patient contact the pharmacy for the refill. If patient does not wish to contact the pharmacy document the reason why and proceed with request.) (Agent: If yes, when and what did the pharmacy advise?)  This is the patient's preferred pharmacy:  [id number] [place], [place] - [address] AT [facility] OF [address] [place] [address] [place] [address] Phone: [phone] Fax: [phone]  Is this the correct pharmacy for this prescription? Yes If no, delete pharmacy and type the correct one.   Has the prescription been filled recently? Yes  Is the patient out of the medication? Yes  Has the patient been seen for an appointment in the last year OR does the patient have an upcoming appointment? Yes  Can we respond through MyChart? Yes  Agent: Please be advised that Rx refills may take up to 3 business days. We ask that you follow-up with your pharmacy.

## 2023-09-26 ENCOUNTER — Other Ambulatory Visit: Payer: Self-pay | Admitting: Family Medicine

## 2023-09-27 NOTE — Telephone Encounter (Signed)
 Last office visit 08/27/2023 for CPE.  Last refilled 08/27/2023 for #30 with no refills.  Next Appt:  CPE 08/30/2024

## 2023-09-28 ENCOUNTER — Other Ambulatory Visit (HOSPITAL_COMMUNITY): Payer: Self-pay

## 2023-10-14 ENCOUNTER — Other Ambulatory Visit: Payer: Self-pay

## 2023-10-18 ENCOUNTER — Other Ambulatory Visit (HOSPITAL_COMMUNITY): Payer: Self-pay

## 2023-10-22 ENCOUNTER — Other Ambulatory Visit: Payer: Self-pay

## 2023-10-22 NOTE — Progress Notes (Signed)
 Specialty Pharmacy Refill Coordination Note  Jillian [name] is a 68 y.o. adult contacted today regarding refills of specialty medication(s) Ixekizumab  (Taltz )   Patient requested Delivery   Delivery date: 10/26/23   Verified address: [address]  [place]    Medication will be filled on 10/25/23.

## 2023-10-25 ENCOUNTER — Other Ambulatory Visit: Payer: Self-pay

## 2023-10-28 ENCOUNTER — Other Ambulatory Visit: Payer: Self-pay | Admitting: Family Medicine

## 2023-10-28 NOTE — Telephone Encounter (Signed)
 Last office visit 08/27/2023 for CPE.   Last refilled 09/28/2023 for #30 with no refills.  Next Appt: CPE 08/30/2024

## 2023-11-15 ENCOUNTER — Other Ambulatory Visit: Payer: Self-pay

## 2023-11-16 ENCOUNTER — Other Ambulatory Visit: Payer: Self-pay | Admitting: Rheumatology

## 2023-11-16 ENCOUNTER — Other Ambulatory Visit: Payer: Self-pay

## 2023-11-16 DIAGNOSIS — L4050 Arthropathic psoriasis, unspecified: Secondary | ICD-10-CM

## 2023-11-16 DIAGNOSIS — L409 Psoriasis, unspecified: Secondary | ICD-10-CM

## 2023-11-16 MED ORDER — TALTZ 80 MG/ML ~~LOC~~ SOAJ
SUBCUTANEOUS | 0 refills | Status: DC
  Filled 2023-11-16: qty 1, 28d supply, fill #0
  Filled 2023-12-13: qty 1, 28d supply, fill #1
  Filled 2024-01-10 – 2024-01-12 (×2): qty 1, 28d supply, fill #2

## 2023-11-16 NOTE — Telephone Encounter (Signed)
 Last Fill: 08/25/2023  Labs: 07/23/2023 CBC and CMP WNL   TB Gold: 07/23/2023 negative    Next Visit: 01/26/2024  Last Visit: 08/25/2023  IK:Ednmpjupr arthritis   Current Dose per office note on 08/25/2023: Taltz  80 mg sq injections every 4 weeks.   Advised patient she is due to update labs. Patient states she will come update labs on Thursday. Lab orders are in place.   Okay to refill Taltz ?

## 2023-11-16 NOTE — Progress Notes (Signed)
 Specialty Pharmacy Ongoing Clinical Assessment Note  Jillian [name] is a 68 y.o. adult who is being followed by the specialty pharmacy service for RxSp Psoriatic Arthritis   Patient's specialty medication(s) reviewed today: Ixekizumab  (Taltz )   Missed doses in the last 4 weeks: 0   Patient/Caregiver did not have any additional questions or concerns.   Therapeutic benefit summary: Patient is achieving benefit   Adverse events/side effects summary: No adverse events/side effects   Patient's therapy is appropriate to: Continue    Goals Addressed             This Visit's Progress    Maintain optimal adherence to therapy   On track    Patient is on track. Patient will maintain adherence, adhere to provider and/or lab appointments, and be monitored by provider to determine if a change in treatment plan is warranted         Follow up: 12 months  Silvano LOISE Dolly Specialty Pharmacist

## 2023-11-16 NOTE — Progress Notes (Signed)
 Specialty Pharmacy Refill Coordination Note  Jillian [name] is a 68 y.o. adult contacted today regarding refills of specialty medication(s) Ixekizumab  (Taltz )   Patient requested Delivery   Delivery date: 11/19/23   Verified address: [address]  [place]   Medication will be filled on 11/18/23.  This fill date is pending response to refill request from provider. Patient is aware and if they have not received fill by intended date they must follow up with pharmacy.

## 2023-11-18 ENCOUNTER — Other Ambulatory Visit: Payer: Self-pay

## 2023-11-18 DIAGNOSIS — Z79899 Other long term (current) drug therapy: Secondary | ICD-10-CM

## 2023-11-18 LAB — CBC WITH DIFFERENTIAL/PLATELET
Absolute Lymphocytes: 2225 {cells}/uL (ref 850–3900)
Absolute Monocytes: 572 {cells}/uL (ref 200–950)
Basophils Absolute: 49 {cells}/uL (ref 0–200)
Basophils Relative: 0.9 %
Eosinophils Absolute: 189 {cells}/uL (ref 15–500)
Eosinophils Relative: 3.5 %
HCT: 40.3 % (ref 35.0–45.0)
Hemoglobin: 13.2 g/dL (ref 11.7–15.5)
MCH: 31.1 pg (ref 27.0–33.0)
MCHC: 32.8 g/dL (ref 32.0–36.0)
MCV: 94.8 fL (ref 80.0–100.0)
MPV: 11.7 fL (ref 7.5–12.5)
Monocytes Relative: 10.6 %
Neutro Abs: 2365 {cells}/uL (ref 1500–7800)
Neutrophils Relative %: 43.8 %
Platelets: 219 Thousand/uL (ref 140–400)
RBC: 4.25 Million/uL (ref 3.80–5.10)
RDW: 11.9 % (ref 11.0–15.0)
Total Lymphocyte: 41.2 %
WBC: 5.4 Thousand/uL (ref 3.8–10.8)

## 2023-11-18 LAB — COMPREHENSIVE METABOLIC PANEL WITH GFR
AG Ratio: 2 (calc) (ref 1.0–2.5)
ALT: 19 U/L (ref 6–29)
AST: 17 U/L (ref 10–35)
Albumin: 4.4 g/dL (ref 3.6–5.1)
Alkaline phosphatase (APISO): 85 U/L (ref 37–153)
BUN: 23 mg/dL (ref 7–25)
CO2: 26 mmol/L (ref 20–32)
Calcium: 9.8 mg/dL (ref 8.6–10.4)
Chloride: 103 mmol/L (ref 98–110)
Creat: 0.94 mg/dL (ref 0.50–1.05)
Globulin: 2.2 g/dL (ref 1.9–3.7)
Glucose, Bld: 89 mg/dL (ref 65–99)
Potassium: 4.4 mmol/L (ref 3.5–5.3)
Sodium: 138 mmol/L (ref 135–146)
Total Bilirubin: 0.3 mg/dL (ref 0.2–1.2)
Total Protein: 6.6 g/dL (ref 6.1–8.1)
eGFR: 66 mL/min/1.73m2 (ref >=60)

## 2023-11-22 ENCOUNTER — Encounter: Payer: Self-pay | Admitting: Family Medicine

## 2023-11-22 ENCOUNTER — Ambulatory Visit: Payer: Self-pay | Admitting: Physician Assistant

## 2023-11-22 NOTE — Progress Notes (Signed)
 CBC and CMP WNL

## 2023-11-23 ENCOUNTER — Other Ambulatory Visit: Payer: Self-pay | Admitting: Family Medicine

## 2023-11-23 MED ORDER — PREGABALIN 150 MG PO CAPS: 150.0000 mg | ORAL_CAPSULE | Freq: Three times a day (TID) | ORAL | Status: DC

## 2023-11-26 ENCOUNTER — Other Ambulatory Visit: Payer: Self-pay | Admitting: Family Medicine

## 2023-11-28 ENCOUNTER — Other Ambulatory Visit: Payer: Self-pay | Admitting: Family Medicine

## 2023-11-30 ENCOUNTER — Other Ambulatory Visit: Payer: Self-pay | Admitting: Family Medicine

## 2023-11-30 MED ORDER — PREGABALIN 150 MG PO CAPS: 150.0000 mg | ORAL_CAPSULE | Freq: Three times a day (TID) | ORAL | 0 refills | Status: DC

## 2023-11-30 NOTE — Progress Notes (Signed)
Medication refilled as patient requested

## 2023-11-30 NOTE — Telephone Encounter (Signed)
 Last office visit 08/27/23 for CPE.  Last refilled 10/28/23 for #30 with no refills.  Next Appt: CPE 08/30/2024.

## 2023-12-07 ENCOUNTER — Ambulatory Visit: Payer: Self-pay | Admitting: Podiatry

## 2023-12-13 ENCOUNTER — Other Ambulatory Visit: Payer: Self-pay

## 2023-12-13 NOTE — Progress Notes (Signed)
 Specialty Pharmacy Refill Coordination Note  Jillian [name] is a 68 y.o. adult contacted today regarding refills of specialty medication(s) Ixekizumab  (Taltz )   Patient requested Delivery   Delivery date: 12/17/23   Verified address: [address]  [place]   Medication will be filled on 09.18.25.

## 2023-12-14 ENCOUNTER — Ambulatory Visit

## 2023-12-14 ENCOUNTER — Ambulatory Visit (INDEPENDENT_AMBULATORY_CARE_PROVIDER_SITE_OTHER)

## 2023-12-14 ENCOUNTER — Ambulatory Visit: Payer: Self-pay | Admitting: Podiatry

## 2023-12-14 DIAGNOSIS — L4050 Arthropathic psoriasis, unspecified: Secondary | ICD-10-CM

## 2023-12-14 DIAGNOSIS — M19072 Primary osteoarthritis, left ankle and foot: Secondary | ICD-10-CM

## 2023-12-14 DIAGNOSIS — M19071 Primary osteoarthritis, right ankle and foot: Secondary | ICD-10-CM | POA: Diagnosis not present

## 2023-12-14 NOTE — Progress Notes (Signed)
 Chief Complaint  Patient presents with   Foot Orthotics    Bilateral diffuse foot pain - patient has psoriatic arthritis and firbromyalgia. She thinks orthotics might help her pain    HPI: 68 y.o. adult presenting today for evaluation of generalized bilateral foot pain and psoriatic arthritis with fibromyalgia.  She has tried different OTC arch supports.  She is very active and goes to the [employer].  Currently the feet are very tolerable and she has minimal achiness.  She is wondering if orthotics would help alleviate some of her symptoms further  Past Medical History:  Diagnosis Date   Anxiety    Cancer (HCC)    basal cell 2016   Dermatitis    per patient    Fibromyalgia    Prediabetes 12/03/2017   Primary localized osteoarthritis of right knee 02/07/2019   Sleep apnea    uses CPAP   Thrush    UTI (urinary tract infection)     Past Surgical History:  Procedure Laterality Date   BIOPSY  02/05/2021   Procedure: BIOPSY;  Surgeon: Lyndel Deward PARAS, MD;  Location: [facility];  Service: General;;   CATARACT EXTRACTION W/PHACO Left 12/15/2022   Procedure: CATARACT EXTRACTION PHACO AND INTRAOCULAR LENS PLACEMENT (IOC) LEFT MALYUGIN  CLAREON VIVITY LENS 3.03 00:22.3;  Surgeon: Jaye Fallow, MD;  Location: [facility];  Service: Ophthalmology;  Laterality: Left;   CATARACT EXTRACTION W/PHACO Right 12/29/2022   Procedure: CATARACT EXTRACTION PHACO AND INTRAOCULAR LENS PLACEMENT (IOC) RIGHT MALYUGIN  CLAREON VIVITY LENS 3.15 00:21.2;  Surgeon: Jaye Fallow, MD;  Location: [facility];  Service: Ophthalmology;  Laterality: Right;   CESAREAN SECTION     times 2   COLONOSCOPY WITH PROPOFOL  N/A 10/21/2022   Procedure: COLONOSCOPY WITH PROPOFOL ;  Surgeon: Unk Corinn Skiff, MD;  Location: [facility];  Service: Gastroenterology;  Laterality: N/A;   ESOPHAGOGASTRODUODENOSCOPY N/A 02/05/2021   Procedure: ESOPHAGOGASTRODUODENOSCOPY (EGD);  Surgeon:  Lyndel Deward PARAS, MD;  Location: [facility];  Service: General;  Laterality: N/A;   EYE SURGERY Bilateral 04/24/2020   bilateral eyelids    GASTRIC BYPASS  06/03/2021   HERNIA REPAIR     PARTIAL KNEE ARTHROPLASTY Right 02/07/2019   Procedure: UNICOMPARTMENTAL KNEE;  Surgeon: Josefina Chew, MD;  Location: [facility];  Service: Orthopedics;  Laterality: Right;   PARTIAL KNEE ARTHROPLASTY Left 03/21/2019   Procedure: UNICOMPARTMENTAL KNEE;  Surgeon: Josefina Chew, MD;  Location: [facility];  Service: Orthopedics;  Laterality: Left;   UPPER GI ENDOSCOPY  03/2021   UPPER GI ENDOSCOPY N/A 06/03/2021   Procedure: UPPER GI ENDOSCOPY;  Surgeon: Lyndel Deward PARAS, MD;  Location: [facility];  Service: General;  Laterality: N/A;    No Known Allergies   Physical Exam: General: The patient is alert and oriented x3 in no acute distress.  Dermatology: Skin is warm, dry and supple bilateral lower extremities.   Vascular: Palpable pedal pulses bilaterally. Capillary refill within normal limits.  No appreciable edema.  No erythema.  Neurological: Grossly intact via light touch  Musculoskeletal Exam: Mild flatfoot deformity.  Generalized tenderness throughout palpation to the foot  Radiographic Exam B/L feet 12/14/2023:  Normal osseous mineralization.  Mild degenerative changes noted throughout the foot.  Mild collapse of the medial longitudinal arch of the foot also noted on lateral view  Assessment/Plan of Care: 1.  Osteoarthritis bilateral foot 2.  Mild flatfoot deformity bilateral  -Patient evaluated.  X-rays reviewed -Prior to getting expensive custom orthotics I did recommend that she goes  to [employer] running store and tries good supportive tennis shoes.  If this seems to alleviate much of her foot symptoms and she is able to be active recommend just continuing good tennis shoes.  If she continues to have some tenderness recommend that she comes into our office with an appointment for custom  molded orthotics -Return to clinic PRN     Thresa EMERSON Sar, DPM [facility]  Dr. Thresa EMERSON Sar, DPM    [address] [place], [place] [address]                Office [phone]  Fax [phone]

## 2023-12-15 ENCOUNTER — Other Ambulatory Visit: Payer: Self-pay

## 2023-12-16 ENCOUNTER — Other Ambulatory Visit (HOSPITAL_COMMUNITY): Payer: Self-pay

## 2023-12-29 ENCOUNTER — Encounter (HOSPITAL_COMMUNITY): Payer: Self-pay | Admitting: *Deleted

## 2024-01-03 ENCOUNTER — Ambulatory Visit

## 2024-01-03 DIAGNOSIS — C4490 Unspecified malignant neoplasm of skin, unspecified: Secondary | ICD-10-CM

## 2024-01-03 DIAGNOSIS — L578 Other skin changes due to chronic exposure to nonionizing radiation: Secondary | ICD-10-CM

## 2024-01-03 DIAGNOSIS — L821 Other seborrheic keratosis: Secondary | ICD-10-CM

## 2024-01-03 DIAGNOSIS — W908XXA Exposure to other nonionizing radiation, initial encounter: Secondary | ICD-10-CM

## 2024-01-03 DIAGNOSIS — D229 Melanocytic nevi, unspecified: Secondary | ICD-10-CM

## 2024-01-03 DIAGNOSIS — D1801 Hemangioma of skin and subcutaneous tissue: Secondary | ICD-10-CM

## 2024-01-03 DIAGNOSIS — L814 Other melanin hyperpigmentation: Secondary | ICD-10-CM | POA: Diagnosis not present

## 2024-01-03 DIAGNOSIS — Z85828 Personal history of other malignant neoplasm of skin: Secondary | ICD-10-CM

## 2024-01-03 DIAGNOSIS — Z1283 Encounter for screening for malignant neoplasm of skin: Secondary | ICD-10-CM

## 2024-01-03 DIAGNOSIS — L409 Psoriasis, unspecified: Secondary | ICD-10-CM | POA: Diagnosis not present

## 2024-01-03 MED ORDER — CLOBETASOL PROPIONATE 0.05 % EX SOLN: CUTANEOUS | 5 refills | Status: AC

## 2024-01-03 MED ORDER — TACROLIMUS 0.1 % EX OINT: TOPICAL_OINTMENT | CUTANEOUS | 5 refills | Status: AC

## 2024-01-03 MED ORDER — CLOBETASOL PROPIONATE 0.05 % EX OINT: TOPICAL_OINTMENT | CUTANEOUS | 5 refills | Status: AC

## 2024-01-03 NOTE — Patient Instructions (Signed)
 Sunscreen  Who needs sunscreen? Everyone. Sunscreen use can help prevent skin cancer by protecting you from the sun's harmful ultraviolet rays. Anyone can get skin cancer, regardless of age, gender or race. In fact, it is estimated that one in five Americans will develop skin cancer in their lifetime.  Sunscreen alone cannot fully protect you. In addition to wearing sunscreen, dermatologists recommend taking the following steps to protect your skin and find skin cancer early:  Seek shade when appropriate, remembering that the sun's rays are strongest between 10 a.m. and 2 p.m. If your shadow is shorter than you are, seek shade. Dress to protect yourself from the sun by wearing a lightweight long-sleeved shirt, pants, a wide-brimmed hat and sunglasses, when possible.  Use extra caution near water , snow and sand as they reflect the damaging rays of the sun, which can increase your chance of sunburn.  Get vitamin D  safely through a healthy diet that may include vitamin supplements. Don't seek the sun. Avoid tanning beds. Ultraviolet light from the sun and tanning beds can cause skin cancer and wrinkling. If you want to look tan, you may wish to use a self-tanning product, but continue to use sunscreen with it.  When should I use sunscreen? Every day you go outside--even if you're just walking to and from your form of transportation. The sun emits harmful UV rays year-round. Even on cloudy days, up to 80 percent of the sun's harmful UV rays can penetrate your skin. Snow, sand and water  increase the need for sunscreen because they reflect the sun's rays.  How much sunscreen should I use, and how often should I apply it? Most people only apply 25-50 percent of the recommended amount of sunscreen. Apply enough sunscreen to cover all exposed skin. Most adults need about 1 ounce -- or enough to fill a shot glass -- to fully cover their body.  Don't forget to apply to the tops of your feet, your neck, your ears  and the top of your head. Apply sunscreen to dry skin 15 minutes before going outdoors.  Skin cancer also can form on the lips. To protect your lips, apply a lip balm or lipstick that contains sunscreen with an SPF of 30 or higher.  When outdoors, reapply sunscreen approximately every two hours, or after swimming or sweating, according to the directions on the bottle.   Broad-spectrum sunscreens protect against both UVA and UVB rays. What is the difference between the rays? Sunlight consists of two types of harmful rays that reach the earth -- UVA rays and UVB rays. Overexposure to either can lead to skin cancer. In addition to causing skin cancer, here's what each of these rays do:  UVA rays (or aging rays) can prematurely age your skin, causing wrinkles and age spots, and can pass through window glass. UVB rays (or burning rays) are the primary cause of sunburn and are blocked by window glass  There is no safe way to tan. Every time you tan, you damage your skin. As this damage builds, you speed up the aging of your skin and increase your risk for all types of skin cancer.  What is the difference between chemical and physical sunscreens? Chemical sunscreens work like a sponge, absorbing the sun's rays. They contain one or more of the following active ingredients: oxybenzone, avobenzone, octisalate, octocrylene, homosalate and octinoxate. These formulations tend to be easier to rub into the skin without leaving a white residue.   Physical sunscreens work like a shield,  sitting sit on the surface of your skin and deflecting the sun's rays. They contain the active ingredients zinc oxide and/or titanium dioxide. Use this sunscreen if you have sensitive skin.   What type of sunscreen should I use? The best type of sunscreen is the one you will use again and again. Just make sure it offers broad-spectrum (UVA and UVB) protection, has an SPF of 30+, and is water -resistant. The kind of sunscreen you use is  a matter of personal choice, and may vary depending on the area of the body to be protected. Available sunscreen options include lotions, creams, gels, ointments, wax sticks and sprays.  Recommended physical sunscreens for face: - Neutrogena Sheer Zinc - Aveeno Positively Mineral Sensitive - CeraVe Hydrating Mineral (also has a tinted version) - La Roche-Posay Anthelios Mineral Face (comes as a cream, lotion, light fluid, and there is also a tinted version).  - EltaMD UV Clear (also has a tinted version)  Recommended physical sunscreens for body: - Neutrogena Sheer Zinc Dry-Touch Sunscreen Sensitive Skin Lotion Broad Spectrum SPF 50 - Aveeno Positively Mineral Sensitive Skin Sunscreen Broad Spectrum SPF 50 - La Roche-Posay Anthelios SPF 50 Mineral Sunscreen - Gentle Lotion - CeraVe Hydrating Mineral Sunscreen SPF 50  Recommended chemical sunscreens for face: - Anthelios UV Correct Face Sunscreen SPF 70 with Niacinamide - Neutrogena Clear Face Oil-Free SPF 50 with Helioplex - Neutrogena Sport Face Oil-Free SPF 70+ with Helioplex - Aveeno Protect + Hydrate Sunscreen For Face SPF 70 - La Roche-Posay Anthelios Light Fluid Sunscreen for Face SPF 60  Recommended chemical sunscreens for body: - Neutrogena Ultra Sheer Dry-Touch Sunscreen SPF 70 - Aveeno Protect + Hydrate Broad Spectrum All-Day Hydration SPF 60 (comes in a big pump) - La Roche-Posay Anthelios Melt-In Milk Sunscreen SPF 60    Due to recent changes in healthcare laws, you may see results of your pathology and/or laboratory studies on MyChart before the doctors have had a chance to review them. We understand that in some cases there may be results that are confusing or concerning to you. Please understand that not all results are received at the same time and often the doctors may need to interpret multiple results in order to provide you with the best plan of care or course of treatment. Therefore, we ask that you please give us  2  business days to thoroughly review all your results before contacting the office for clarification. Should we see a critical lab result, you will be contacted sooner.   If You Need Anything After Your Visit  If you have any questions or concerns for your doctor, please call our main line at [phone] and press option 4 to reach your doctor's medical assistant. If no one answers, please leave a voicemail as directed and we will return your call as soon as possible. Messages left after 4 pm will be answered the following business day.   You may also send us  a message via MyChart. We typically respond to MyChart messages within 1-2 business days.  For prescription refills, please ask your pharmacy to contact our office. Our fax number is [phone].  If you have an urgent issue when the clinic is closed that cannot wait until the next business day, you can page your doctor at the number below.    Please note that while we do our best to be available for urgent issues outside of office hours, we are not available 24/7.   If you have an urgent issue and are  unable to reach us , you may choose to seek medical care at your doctor's office, retail clinic, urgent care center, or emergency room.  If you have a medical emergency, please immediately call 911 or go to the emergency department.  Pager Numbers  - Dr. Hester: [phone]  - Dr. Jackquline: [phone]  - Dr. Claudene: [phone]   In the event of inclement weather, please call our main line at [phone] for an update on the status of any delays or closures.  Dermatology Medication Tips: Please keep the boxes that topical medications come in in order to help keep track of the instructions about where and how to use these. Pharmacies typically print the medication instructions only on the boxes and not directly on the medication tubes.   If your medication is too expensive, please contact our office at [phone] option 4 or  send us  a message through MyChart.   We are unable to tell what your co-pay for medications will be in advance as this is different depending on your insurance coverage. However, we may be able to find a substitute medication at lower cost or fill out paperwork to get insurance to cover a needed medication.   If a prior authorization is required to get your medication covered by your insurance company, please allow us  1-2 business days to complete this process.  Drug prices often vary depending on where the prescription is filled and some pharmacies may offer cheaper prices.  The website www.goodrx.com contains coupons for medications through different pharmacies. The prices here do not account for what the cost may be with help from insurance (it may be cheaper with your insurance), but the website can give you the price if you did not use any insurance.  - You can print the associated coupon and take it with your prescription to the pharmacy.  - You may also stop by our office during regular business hours and pick up a GoodRx coupon card.  - If you need your prescription sent electronically to a different pharmacy, notify our office through [facility] or by phone at [phone] option 4.     Si Usted Necesita Algo Despus de Su Visita  Tambin puede enviarnos un mensaje a travs de Clinical cytogeneticist. Por lo general respondemos a los mensajes de MyChart en el transcurso de 1 a 2 das hbiles.  Para renovar recetas, por favor pida a su farmacia que se ponga en contacto con nuestra oficina. Randi lakes de fax es [place] [phone].  Si tiene un asunto urgente cuando la clnica est cerrada y que no puede esperar hasta el siguiente da hbil, puede llamar/localizar a su doctor(a) al nmero que aparece a continuacin.   Por favor, tenga en cuenta que aunque hacemos todo lo posible para estar disponibles para asuntos urgentes fuera del horario de [place], no estamos disponibles las 24 horas del  da, los 7 [address]  [address] de la [place].   Si tiene un problema urgente y no puede comunicarse con nosotros, puede optar por buscar atencin mdica  en el consultorio de su doctor(a), en una clnica privada, en un centro de atencin urgente o en una sala de emergencias.  Si tiene Engineer, drilling, por favor llame inmediatamente al 911 o vaya a la sala de emergencias.  Nmeros de bper  - Dr. Hester: [phone]  - Dra. Jackquline: [phone]  - Dr. Claudene: [phone]   En caso de inclemencias del tiempo, por favor llame a landry capes principal al [phone] para ignacia actualizacin sobre  el estado de cualquier retraso o cierre.  Consejos para la medicacin en dermatologa: Por favor, guarde las cajas en las que vienen los medicamentos de uso tpico para ayudarle a seguir las instrucciones sobre dnde y cmo usarlos. Las farmacias generalmente imprimen las instrucciones del medicamento slo en las cajas y no directamente en los tubos del [place].   Si su medicamento es muy caro, por favor, pngase en contacto con landry rieger llamando al [phone] y presione la opcin 4 o envenos un mensaje a travs de Clinical cytogeneticist.   No podemos decirle cul ser su copago por los medicamentos por adelantado ya que esto es diferente dependiendo de la cobertura de su seguro. Sin embargo, es posible que podamos encontrar un medicamento sustituto a Audiological scientist un formulario para que el seguro cubra el medicamento que se considera necesario.   Si se requiere una autorizacin previa para que su compaa de seguros malta su medicamento, por favor permtanos de 1 a 2 das hbiles para completar este proceso.  Los precios de los medicamentos varan con frecuencia dependiendo del Environmental consultant de dnde se surte la receta y alguna farmacias pueden ofrecer precios ms baratos.  El sitio web www.goodrx.com tiene cupones para medicamentos de Health and safety inspector. Los precios aqu no tienen en cuenta lo que podra  costar con la ayuda del seguro (puede ser ms barato con su seguro), pero el sitio web puede darle el precio si no utiliz Tourist information centre manager.  - Puede imprimir el cupn correspondiente y llevarlo con su receta a la farmacia.  - Tambin puede pasar por nuestra oficina durante el horario de atencin regular y Education officer, museum una tarjeta de cupones de GoodRx.  - Si necesita que su receta se enve electrnicamente a una farmacia diferente, informe a nuestra oficina a travs de MyChart de [place] o por telfono llamando al [phone] y presione la opcin 4.

## 2024-01-03 NOTE — Progress Notes (Signed)
 Subjective   Jillian [name] is a 68 y.o. adult who presents for the following: Total body skin exam for skin cancer screening and mole check. The patient has spots, moles and lesions to be evaluated, some may be new or changing and the patient may have concern these could be cancer.. Patient is new patient  Today patient reports: Place at left ankle present for about a year, is getting lighter, not itchy. Hx of BCC at right upper arm and back x2 around 2022 and 2016. Family hx of skin cancer father- SCC, sister- MM, and both brothers- BCC. Patient was a previous patient of [facility], has not had a skin check in a while. Reports hx psoriasis currently on Taltz  prescribed by rheumatology, uses betamethasone  for topical. Still with rash occasionally on scalp, face, hands.   Review of Systems:    No other skin or systemic complaints except as noted in HPI or Assessment and Plan.  The following portions of the chart were reviewed this encounter and updated as appropriate: medications, allergies, medical history  Relevant Medical History:  Personal history of non melanoma skin cancer - see medical history for full details  and Family history of skin cancer - BCC and MM.    Objective  Well appearing patient in no apparent distress; mood and affect are within normal limits. Examination was performed of the: Full Skin Examination: scalp, head, eyes, ears, nose, lips, neck, chest, axillae, abdomen, back, buttocks, bilateral upper extremities, bilateral lower extremities, hands, feet, fingers, toes, fingernails, and toenails.   Examination notable for: SKIN EXAM, Angioma(s): Scattered red vascular papule(s)  , Lentigo/lentigines: Scattered pigmented macules that are tan to brown in color and are somewhat non-uniform in shape and concentrated in the sun-exposed areas, Nevus/nevi: Scattered well-demarcated, regular, pigmented macule(s) and/or papule(s)  , Seborrheic Keratosis(es): Stuck-on  appearing keratotic papule(s) on the trunk, some  irritated with redness, crusting, edema, and/or partial avulsion, Actinic Damage/Elastosis: chronic sun damage: dyspigmentation, telangiectasia, and wrinkling, Actinic keratosis: Scaly erythematous macule(s) concentrated on sun exposed areas , Actinic Purpura: Diffuse atrophy with scattered purpuric patches 1-3 cm in size on the bilateral forearms and dorsal hands. There is evidence of post-inflammatory hyperpigmentation with an orange-brown color change  Scaly pink plaques hands and scalp  Examination limited by: Undergarments and Patient deferred removal       Assessment & Plan   BENIGN SKIN FINDINGS  - Lentigines  - Seborrheic keratoses  - Hemangiomas   - Nevus/Multiple Benign Nevi  - Reassurance provided regarding the benign appearance of lesions noted on exam today; no treatment is indicated in the absence of symptoms/changes. - Reinforced importance of photoprotective strategies including liberal and frequent sunscreen use of a broad-spectrum SPF 30 or greater, use of protective clothing, and sun avoidance for prevention of cutaneous malignancy and photoaging.  Counseled patient on the importance of regular self-skin monitoring as well as routine clinical skin examinations as scheduled.   ACTINIC DAMAGE - Chronic condition, secondary to cumulative UV/sun exposure - Recommend daily broad spectrum sunscreen SPF 30+ to sun-exposed areas, reapply every 2 hours as needed.  - Staying in the shade or wearing long sleeves, sun glasses (UVA+UVB protection) and wide brim hats (4-inch brim around the entire circumference of the hat) are also recommended for sun protection.  - Call for new or changing lesions.  Personal history of non melanoma skin cancer  - Reviewed medical history for full details  - Reviewed sun protective measures as above - Encouraged  full body skin exams     Psoriasis with PsA - mild, BSA% 1 - well controlled on Taltz  w/  intermittent flares  - follows with Rheum  Chronic and persistent condition with duration or expected duration over one year. Condition is symptomatic and bothersome to patient. Patient is flaring and not currently at treatment goal.  - Educated, discussed chronic nature and waxing/waning. - Continue taltz  - prescribed by Rheum  - Clobetasol ointment 0.05% twice daily for 2 weeks to thick plaques. Can similarly retreat for flares.  - Clobetasol 0.05% solution daily for scalp involvement  - Discussed risks including possibility of steroid atrophy with long-term usage Start tacrolimus ointment 1% twice daily on eyelids Educated about black box warning (and also that recent studies show no increased malignancy risk) and common adverse effects such as burning with application (advised to cool in fridge and only apply to bone-dry skin).  Level of service outlined above   Procedures, orders, diagnosis for this visit:  SKIN EXAM FOR MALIGNANT NEOPLASM   LENTIGO   SEBORRHEIC KERATOSIS   CHERRY ANGIOMA   MULTIPLE BENIGN NEVI   ACTINIC SKIN DAMAGE   NON-MELANOMA SKIN CANCER   PSORIASIS   Related Medications ixekizumab  (TALTZ ) 80 MG/ML pen INJECT 80MG  (1 PEN) UNDER THE SKIN EVERY 4 WEEKS  Skin exam for malignant neoplasm  Lentigo  Seborrheic keratosis  Cherry angioma  Multiple benign nevi  Actinic skin damage  Non-melanoma skin cancer  Psoriasis  Other orders -     Clobetasol Propionate; Apply 1 gram topically to affected area of skin twice daily. Stop once resolved and restart as needed for flares. Avoid use on face, armpits, groin unless otherwise indicated.  Dispense: 60 g; Refill: 5 -     Clobetasol Propionate; Apply 1 mL to affected areas of skin twice daily  Dispense: 50 mL; Refill: 5 -     Tacrolimus; Apply 2 grams twice daily to affected areas of skin  Dispense: 100 g; Refill: 5    Return to clinic: Return in about 1 year (around 01/02/2025) for TBSE,  HxBCC, w/ Dr. Raymund.  Documentation:  I, Jacquelynn V. Wilfred, CMA, am acting as scribe for Lauraine JAYSON Raymund, MD .  I have reviewed the above documentation for accuracy and completeness, and I agree with the above.  Lauraine JAYSON Raymund, MD

## 2024-01-06 ENCOUNTER — Other Ambulatory Visit: Payer: Self-pay

## 2024-01-10 ENCOUNTER — Other Ambulatory Visit: Payer: Self-pay

## 2024-01-12 ENCOUNTER — Other Ambulatory Visit (HOSPITAL_COMMUNITY): Payer: Self-pay

## 2024-01-12 NOTE — Progress Notes (Unsigned)
 Office Visit Note  Patient: Jillian [name]             Date of Birth: 1956-01-21           MRN: [id number]             PCP: Avelina Greig BRAVO, MD Referring: Avelina Greig BRAVO, MD Visit Date: 01/26/2024 Occupation: Data Unavailable  Subjective:  Medication monitoring  History of Present Illness: Jillian [name] is a 68 y.o. adult with history of psoriatic arthritis and osteoarthritis.  Patient remains on Taltz  80 mg sq injections every 4 weeks.  She is tolerating Taltz  without any side effects and has not had any recent gaps in therapy.  She denies any signs or symptoms of a flare.  Overall her symptoms have been well-controlled on the current treatment regimen.  She continues to have mild SI joint pain bilaterally.  She has not had any nocturnal pain or increased morning stiffness.  She denies any Achilles tendinitis or plantar fasciitis.  Patient has been able to increase her activity level and has been walking 35 to 40 minutes daily as well as going to [employer] sneakers twice a week.  Patient states that the psoriasis on her palms is almost completely cleared.  She continues to have occasional psoriasis on the scalp and groin at times.  She uses topical agents as needed.  She denies any new medical conditions.  She denies any upcoming surgeries.  She denies any recent or recurrent infections.  Activities of Daily Living:  Patient reports morning stiffness for the time varies.   Patient Denies nocturnal pain.  Difficulty dressing/grooming: Denies Difficulty climbing stairs: Denies Difficulty getting out of chair: Denies Difficulty using hands for taps, buttons, cutlery, and/or writing: Denies  Review of Systems  Constitutional:  Negative for fatigue.  HENT:  Negative for mouth sores and mouth dryness.   Eyes:  Positive for dryness.  Respiratory:  Negative for shortness of breath.   Cardiovascular:  Negative for chest pain and palpitations.  Gastrointestinal:  Positive for constipation.  Negative for blood in stool and diarrhea.  Endocrine: Negative for increased urination.  Genitourinary:  Negative for involuntary urination.  Musculoskeletal:  Positive for joint pain, gait problem, joint pain, myalgias, morning stiffness, muscle tenderness and myalgias. Negative for joint swelling and muscle weakness.  Skin:  Positive for rash and hair loss. Negative for color change and sensitivity to sunlight.  Allergic/Immunologic: Negative for susceptible to infections.  Neurological:  Negative for dizziness and headaches.  Hematological:  Negative for swollen glands.  Psychiatric/Behavioral:  Positive for sleep disturbance. Negative for depressed mood. The patient is not nervous/anxious.     PMFS History:  Patient Active Problem List   Diagnosis Date Noted   Oral candida 11/12/2022   Family history of colon cancer 10/21/2022   Screening for colon cancer 10/21/2022   Psoriatic arthritis (HCC) 05/07/2021   Psoriasis 05/07/2021   Primary osteoarthritis of both hands 05/07/2021   Primary osteoarthritis of both feet 05/07/2021   Pitting of nails 01/23/2020   OSA (obstructive sleep apnea) 04/12/2019   S/P left unicompartmental knee replacement 03/21/2019   Osteoarthritis of left knee 03/09/2019   Primary localized osteoarthritis of right knee 02/07/2019   Status post right partial knee replacement 02/07/2019   Prediabetes 12/03/2017   Non-restorative sleep 02/27/2016   Vitamin D  deficiency 10/28/2015   Generalized anxiety disorder 11/25/2012   HYPERTRIGLYCERIDEMIA 12/20/2008   MENOPAUSE, EARLY 10/04/2008   Fibromyalgia 10/04/2008    Past Medical  History:  Diagnosis Date   Anxiety    Cancer (HCC)    basal cell 2016   Dermatitis    per patient    Fibromyalgia    Prediabetes 12/03/2017   Primary localized osteoarthritis of right knee 02/07/2019   Sleep apnea    uses CPAP   Thrush    UTI (urinary tract infection)     Family History  Problem Relation Age of Onset    Cancer Father        colon   Hyperlipidemia Father    Hypertension Father    Diabetes Sister    Psoriasis Sister    Psoriasis Brother    Arthritis Brother    Osteoarthritis Brother    Diabetes Brother    Stroke Brother    Throat cancer Brother    Cancer Maternal Grandmother        colon   Hypertension Maternal Grandmother    Anuerysm Paternal Grandfather    Healthy Daughter    Healthy Son    Cancer Paternal Aunt        colon   Breast cancer Neg Hx    Past Surgical History:  Procedure Laterality Date   BIOPSY  02/05/2021   Procedure: BIOPSY;  Surgeon: Stechschulte, Deward PARAS, MD;  Location: [facility];  Service: General;;   CATARACT EXTRACTION W/PHACO Left 12/15/2022   Procedure: CATARACT EXTRACTION PHACO AND INTRAOCULAR LENS PLACEMENT (IOC) LEFT MALYUGIN  CLAREON VIVITY LENS 3.03 00:22.3;  Surgeon: Jaye Fallow, MD;  Location: [facility];  Service: Ophthalmology;  Laterality: Left;   CATARACT EXTRACTION W/PHACO Right 12/29/2022   Procedure: CATARACT EXTRACTION PHACO AND INTRAOCULAR LENS PLACEMENT (IOC) RIGHT MALYUGIN  CLAREON VIVITY LENS 3.15 00:21.2;  Surgeon: Jaye Fallow, MD;  Location: [facility];  Service: Ophthalmology;  Laterality: Right;   CESAREAN SECTION     times 2   COLONOSCOPY WITH PROPOFOL  N/A 10/21/2022   Procedure: COLONOSCOPY WITH PROPOFOL ;  Surgeon: Unk Corinn Skiff, MD;  Location: [facility];  Service: Gastroenterology;  Laterality: N/A;   ESOPHAGOGASTRODUODENOSCOPY N/A 02/05/2021   Procedure: ESOPHAGOGASTRODUODENOSCOPY (EGD);  Surgeon: Lyndel Deward PARAS, MD;  Location: [facility];  Service: General;  Laterality: N/A;   EYE SURGERY Bilateral 04/24/2020   bilateral eyelids    GASTRIC BYPASS  06/03/2021   HERNIA REPAIR     PARTIAL KNEE ARTHROPLASTY Right 02/07/2019   Procedure: UNICOMPARTMENTAL KNEE;  Surgeon: Josefina Chew, MD;  Location: [facility];  Service: Orthopedics;  Laterality: Right;   PARTIAL KNEE ARTHROPLASTY Left  03/21/2019   Procedure: UNICOMPARTMENTAL KNEE;  Surgeon: Josefina Chew, MD;  Location: [facility];  Service: Orthopedics;  Laterality: Left;   UPPER GI ENDOSCOPY  03/2021   UPPER GI ENDOSCOPY N/A 06/03/2021   Procedure: UPPER GI ENDOSCOPY;  Surgeon: Lyndel Deward PARAS, MD;  Location: [facility];  Service: General;  Laterality: N/A;   Social History   Tobacco Use   Smoking status: Former    Current packs/day: 0.00    Average packs/day: 1 pack/day for 15.0 years (15.0 ttl pk-yrs)    Types: Cigarettes    Start date: 03/30/1977    Quit date: 03/30/1992    Years since quitting: 31.8    Passive exposure: Past   Smokeless tobacco: Never  Vaping Use   Vaping status: Never Used  Substance Use Topics   Alcohol use: No   Drug use: No   Social History   Social History Narrative   Regular exercise-yes, but seasonal walks and swims   Diet: healthy,  low carbohydrates, no caffeine     Immunization History  Administered Date(s) Administered   Influenza-Unspecified 01/01/2020, 12/28/2020, 12/28/2021   Moderna Sars-Covid-2 Vaccination 05/23/2019, 07/04/2019   PNEUMOCOCCAL CONJUGATE-20 08/16/2020   Td 03/30/2004   Tdap 10/28/2015   Zoster Recombinant(Shingrix) 07/09/2021, 08/03/2022   Zoster, Live 08/30/2014     Objective: Vital Signs: BP 99/66   Pulse 74   Temp (!) 97.5 F (36.4 C)   Resp 14   Ht 5' 7 (1.702 m)   Wt 197 lb (89.4 kg)   BMI 30.85 kg/m    Physical Exam Vitals and nursing note reviewed.  Constitutional:      Appearance: She is well-developed.  HENT:     Head: Normocephalic and atraumatic.  Eyes:     Conjunctiva/sclera: Conjunctivae normal.     Pupils: Pupils are equal, round, and reactive to light.  Cardiovascular:     Rate and Rhythm: Normal rate and regular rhythm.     Heart sounds: Normal heart sounds.  Pulmonary:     Effort: Pulmonary effort is normal.     Breath sounds: Normal breath sounds.  Abdominal:     General: Bowel sounds are normal.      Palpations: Abdomen is soft.  Musculoskeletal:     Cervical back: Normal range of motion and neck supple.  Skin:    General: Skin is warm and dry.     Capillary Refill: Capillary refill takes less than 2 seconds.  Neurological:     Mental Status: She is alert and oriented to person, place, and time.  Psychiatric:        Behavior: Behavior normal.      Musculoskeletal Exam: C-spine, thoracic spine, lumbar spine have good range of motion.  No midline spinal tenderness.  Mild SI joint tenderness bilaterally.  Shoulder joints, elbow joints, wrist joints, MCPs, PIPs, DIPs have good range of motion with no synovitis.  PIP and DIP thickening.  Mild fingernail pitting noted.  Complete fist formation bilaterally.  Hip joints have good range of motion with no groin pain.  Bilateral partial knee replacements have good range of motion with no effusion.  Ankle joints have good range of motion no tenderness or joint swelling.  No evidence of Achilles tendinitis or plantar fasciitis.   CDAI Exam: CDAI Score: -- Patient Global: --; Provider Global: -- Swollen: --; Tender: -- Joint Exam 01/26/2024   No joint exam has been documented for this visit   There is currently no information documented on the homunculus. Go to the Rheumatology activity and complete the homunculus joint exam.  Investigation: No additional findings.  Imaging: No results found.   Recent Labs: Lab Results  Component Value Date   WBC 5.4 11/18/2023   HGB 13.2 11/18/2023   PLT 219 11/18/2023   NA 138 11/18/2023   K 4.4 11/18/2023   CL 103 11/18/2023   CO2 26 11/18/2023   GLUCOSE 89 11/18/2023   BUN 23 11/18/2023   CREATININE 0.94 11/18/2023   BILITOT 0.3 11/18/2023   ALKPHOS 76 09/19/2021   AST 17 11/18/2023   ALT 19 11/18/2023   PROT 6.6 11/18/2023   ALBUMIN  4.3 09/19/2021   CALCIUM 9.8 11/18/2023   GFRAA 83 07/16/2020   QFTBGOLDPLUS NEGATIVE 07/23/2023    Speciality Comments: tremfya  -started September 16, 2020; Taltz  started 07/02/21  Procedures:  No procedures performed Allergies: Patient has no known allergies.   Assessment / Plan:     Visit Diagnoses: Psoriatic arthritis (HCC) - inflammatory arthritis, psoriasis,  plantar fasciitis, fingernail pitting, RF-, HLA-B27-, chronic SI joint pain, family hx of psoriasis and psoriatic arthritis: She has no synovitis or dactylitis on examination today.  No evidence of Achilles tendinitis or plantar fasciitis.  She continues to have mild SI joint pain bilaterally.  No nocturnal pain.  Overall she is clinically been doing well on Taltz  80 mg sq injections once every 4 weeks.  She is tolerating Taltz  without any side effects or injection site reactions.  No medication changes will be made at this time.  Patient has been able to increase her activity level and has been walking 35 to 40 minutes daily as well as going to [employer] sneakers twice a week.  She was advised to notify us  if she develops signs or symptoms of a flare.  She will follow-up in the office in 5 months or sooner if needed.  Psoriasis - Diagnosed by Dr. Cyrus.  Palmar psoriasis has cleared since initiating Taltz .  Occasional patches of psoriasis on the scalp and groin.  She uses topical agents as needed.  No medication changes will be made at this time.   High risk medication use - Taltz  80 mg sq injections every 4 weeks.  CBC and CMP updated on 11/18/23-WNL.  Orders for CBC and CMP were released today. TB gold negative on 07/23/23.  Hgb A1c updated today.  No recent or recurrent infections. Discussed the importance of holding taltz  if she develops signs or symptoms of an infection and to resume once the infection has completely cleared.  - Plan: CBC with Differential/Platelet, Comprehensive metabolic panel with GFR  Pitting of nails: Mild fingernail pitting noted.  No nail dystrophy noted.  Primary osteoarthritis of both hands: She has PIP and DIP thickening consistent with osteoarthritis of  both hands.  No synovitis or dactylitis noted on examination today.  Complete fist formation bilaterally.  Status post right partial knee replacement - Dr. Pecolia on 02/07/2019.  Doing well.  No warmth or effusion noted.  S/P left unicompartmental knee replacement - Dr. Jeryl on 03/21/2019.  Doing well.  No warmth or effusion noted.  Primary osteoarthritis of both feet: She has good range of motion of both ankle joints with no tenderness or joint swelling.  No evidence of Achilles tendinitis or plantar fasciitis.  Chronic SI joint pain: Patient continues to experience intermittent SI joint pain bilaterally.  She has mild SI joint tenderness upon palpation.  She has not been experiencing any nocturnal pain.  Overall her symptoms have been manageable.  She will notify us  if her symptoms persist or worsen.  Family history of psoriatic arthritis  Fibromyalgia: She takes Flexeril  5 mg at bedtime, Cymbalta  90 mg daily, Lyrica  150 mg 3 times daily, and tramadol  50 mg 1 tablet 3 times daily for pain relief.  Vitamin D  deficiency: She takes vitamin D  2000 units daily.  Osteopenia of multiple sites: DEXA obtained on 01/29/2021: The BMD measured at Femur Neck Left is 0.883 g/cm2 with a T-score of -1.1.  She is taking calcium and vitamin D  supplement daily. Overdue to update DEXA  Other medical conditions are listed as follows:   Non-restorative sleep: She takes trazodone  100 mg at bedtime for insomnia.  Prediabetes  Generalized anxiety disorder  OSA (obstructive sleep apnea)  Status post gastric bypass for obesity  Orders: Orders Placed This Encounter  Procedures   CBC with Differential/Platelet   Comprehensive metabolic panel with GFR   No orders of the defined types were placed in this encounter.  Follow-Up Instructions: Return in about 5 months (around 06/25/2024) for Psoriatic arthritis.   Waddell CHRISTELLA Craze, PA-C  Note - This record has been created using  Dragon software.  Chart creation errors have been sought, but may not always  have been located. Such creation errors do not reflect on  the standard of medical care.

## 2024-01-12 NOTE — Progress Notes (Signed)
 Specialty Pharmacy Refill Coordination Note  Jillian [name] is a 68 y.o. adult contacted today regarding refills of specialty medication(s) Ixekizumab  (Taltz )   Patient requested Delivery   Delivery date: 01/18/24   Verified address: [address]  [name]   Medication will be filled on 01/17/24.

## 2024-01-15 ENCOUNTER — Other Ambulatory Visit: Payer: Self-pay | Admitting: Family

## 2024-01-17 ENCOUNTER — Other Ambulatory Visit: Payer: Self-pay | Admitting: Family Medicine

## 2024-01-17 ENCOUNTER — Other Ambulatory Visit: Payer: Self-pay

## 2024-01-17 NOTE — Telephone Encounter (Unsigned)
 Copied from CRM #[id number]. Topic: Clinical - Medication Refill >> Jan 17, 2024 12:59 PM Paige D wrote: Medication: Pregabalin  (LYRICA ) 150 MG capsule  traMADol  (ULTRAM ) 50 MG tablet  Pharmacy has been giving pt issues and pt is now out of medication would like all meds sent to [facility] going forward.    Has the patient contacted their pharmacy? Yes (Agent: If no, request that the patient contact the pharmacy for the refill. If patient does not wish to contact the pharmacy document the reason why and proceed with request.) (Agent: If yes, when and what did the pharmacy advise?)  This is the patient's preferred pharmacy:   Pharmacy at [facility] [phone] [address] [place], [place], [place] [address] Openuntil 7pm [phone]    Is this the correct pharmacy for this prescription? Yes If no, delete pharmacy and type the correct one.   Has the prescription been filled recently? No  Is the patient out of the medication? Yes  Has the patient been seen for an appointment in the last year OR does the patient have an upcoming appointment? Yes  Can we respond through MyChart? No, prefers phone call   Agent: Please be advised that Rx refills may take up to 3 business days. We ask that you follow-up with your pharmacy.

## 2024-01-17 NOTE — Telephone Encounter (Signed)
 Last office visit 07/31/2023 for CPE.  Last refilled 09/21/2023 for #180 with no refills.  Next Appt: CPE 08/30/2024.

## 2024-01-17 NOTE — Telephone Encounter (Unsigned)
 Copied from CRM [id number]. Topic: Clinical - Medication Refill >> Sep 21, 2023 10:01 AM Gennette ORN wrote: Medication:  traMADol  (ULTRAM ) 50 MG tablet    Has the patient contacted their pharmacy? Yes (Agent: If no, request that the patient contact the pharmacy for the refill. If patient does not wish to contact the pharmacy document the reason why and proceed with request.) (Agent: If yes, when and what did the pharmacy advise?)  This is the patient's preferred pharmacy:  [id number] [place],  - [address] AT [facility] OF [address] [place] [address] [place] [address] Phone: [phone] Fax: [phone]  Is this the correct pharmacy for this prescription? Yes If no, delete pharmacy and type the correct one.   Has the prescription been filled recently? Yes  Is the patient out of the medication? Yes  Has the patient been seen for an appointment in the last year OR does the patient have an upcoming appointment? Yes  Can we respond through MyChart? Yes  Agent: Please be advised that Rx refills may take up to 3 business days. We ask that you follow-up with your pharmacy. >> Jan 17, 2024  1:07 PM Paige D wrote: Pt is calling in regards to meds she does not want meds sent to [facility] no more. She would like all meds going forward sent to [facility] [phone] [address] [place], [place], [place] [address] Openuntil 7pm [phone]  >> Sep 21, 2023  2:52 PM Donna BRAVO wrote: Patient calling in asking if medication has been sent in to pharmacy, refill is Pending,  on 09/16/23 Patient stated requested refill through pharmacy app, pharmacy app stated is has been submitted to provider on 09/16/23.  Patient is out of medication at this time. Please call patient [phone]

## 2024-01-18 MED ORDER — TRAMADOL HCL 50 MG PO TABS: 50.0000 mg | ORAL_TABLET | Freq: Three times a day (TID) | ORAL | 0 refills | Status: DC | PRN

## 2024-01-18 NOTE — Telephone Encounter (Signed)
 Copied from CRM #[id number]. Topic: Clinical - Prescription Issue >> Jan 18, 2024  9:17 AM Berneda FALCON wrote: Reason for CRM: Patient states that she is still waiting on the traMADol  (ULTRAM ) 50 MG tablet and pregabalin  (LYRICA ) 150 MG capsule.  States that she has been trying to get these filled since last Thursday. She is completely out of Tramadol  for the last couple of days and almost out of the Lyrica .  [facility] [address] ([address]), [place] - [address] [place]) [place] [address] Phone: [phone] Fax: [phone] Hours: Not open 24 hours  Patient callback is [phone]

## 2024-01-19 MED ORDER — PREGABALIN 150 MG PO CAPS: 150.0000 mg | ORAL_CAPSULE | Freq: Three times a day (TID) | ORAL | 1 refills | Status: DC

## 2024-01-19 NOTE — Telephone Encounter (Signed)
 Last office visit 07/31/2023 09/02/02025  for #90 with no refills.  Next Appt: 01/20/24 for Rx

## 2024-01-19 NOTE — Addendum Note (Signed)
 Addended by: WENDELL ARLAND RAMAN on: 01/19/2024 12:30 PM   Modules accepted: Orders

## 2024-01-20 ENCOUNTER — Ambulatory Visit: Admitting: Family Medicine

## 2024-01-26 ENCOUNTER — Encounter: Payer: Self-pay | Admitting: Physician Assistant

## 2024-01-26 ENCOUNTER — Ambulatory Visit: Attending: Physician Assistant | Admitting: Physician Assistant

## 2024-01-26 VITALS — BP 99/66 | HR 74 | Temp 97.5°F | Resp 14 | Ht 67.0 in | Wt 197.0 lb

## 2024-01-26 DIAGNOSIS — M19041 Primary osteoarthritis, right hand: Secondary | ICD-10-CM

## 2024-01-26 DIAGNOSIS — M533 Sacrococcygeal disorders, not elsewhere classified: Secondary | ICD-10-CM

## 2024-01-26 DIAGNOSIS — M797 Fibromyalgia: Secondary | ICD-10-CM | POA: Diagnosis not present

## 2024-01-26 DIAGNOSIS — Z9884 Bariatric surgery status: Secondary | ICD-10-CM

## 2024-01-26 DIAGNOSIS — Z840 Family history of diseases of the skin and subcutaneous tissue: Secondary | ICD-10-CM | POA: Diagnosis not present

## 2024-01-26 DIAGNOSIS — M19042 Primary osteoarthritis, left hand: Secondary | ICD-10-CM

## 2024-01-26 DIAGNOSIS — L4050 Arthropathic psoriasis, unspecified: Secondary | ICD-10-CM

## 2024-01-26 DIAGNOSIS — G4733 Obstructive sleep apnea (adult) (pediatric): Secondary | ICD-10-CM

## 2024-01-26 DIAGNOSIS — M19072 Primary osteoarthritis, left ankle and foot: Secondary | ICD-10-CM

## 2024-01-26 DIAGNOSIS — M8589 Other specified disorders of bone density and structure, multiple sites: Secondary | ICD-10-CM

## 2024-01-26 DIAGNOSIS — G8929 Other chronic pain: Secondary | ICD-10-CM

## 2024-01-26 DIAGNOSIS — M19071 Primary osteoarthritis, right ankle and foot: Secondary | ICD-10-CM | POA: Diagnosis not present

## 2024-01-26 DIAGNOSIS — Z79899 Other long term (current) drug therapy: Secondary | ICD-10-CM

## 2024-01-26 DIAGNOSIS — Z96651 Presence of right artificial knee joint: Secondary | ICD-10-CM | POA: Diagnosis not present

## 2024-01-26 DIAGNOSIS — L409 Psoriasis, unspecified: Secondary | ICD-10-CM

## 2024-01-26 DIAGNOSIS — F411 Generalized anxiety disorder: Secondary | ICD-10-CM

## 2024-01-26 DIAGNOSIS — L608 Other nail disorders: Secondary | ICD-10-CM

## 2024-01-26 DIAGNOSIS — Z96652 Presence of left artificial knee joint: Secondary | ICD-10-CM | POA: Diagnosis not present

## 2024-01-26 DIAGNOSIS — E559 Vitamin D deficiency, unspecified: Secondary | ICD-10-CM

## 2024-01-26 DIAGNOSIS — R7303 Prediabetes: Secondary | ICD-10-CM

## 2024-01-26 DIAGNOSIS — G478 Other sleep disorders: Secondary | ICD-10-CM

## 2024-01-26 LAB — CBC WITH DIFFERENTIAL/PLATELET
Absolute Lymphocytes: 2045 {cells}/uL (ref 850–3900)
Absolute Monocytes: 698 {cells}/uL (ref 200–950)
Basophils Absolute: 50 {cells}/uL (ref 0–200)
Basophils Relative: 0.7 %
Eosinophils Absolute: 151 {cells}/uL (ref 15–500)
Eosinophils Relative: 2.1 %
HCT: 39 % (ref 35.0–45.0)
Hemoglobin: 12.9 g/dL (ref 11.7–15.5)
MCH: 30.6 pg (ref 27.0–33.0)
MCHC: 33.1 g/dL (ref 32.0–36.0)
MCV: 92.6 fL (ref 80.0–100.0)
MPV: 11.7 fL (ref 7.5–12.5)
Monocytes Relative: 9.7 %
Neutro Abs: 4255 {cells}/uL (ref 1500–7800)
Neutrophils Relative %: 59.1 %
Platelets: 211 Thousand/uL (ref 140–400)
RBC: 4.21 Million/uL (ref 3.80–5.10)
RDW: 12 % (ref 11.0–15.0)
Total Lymphocyte: 28.4 %
WBC: 7.2 Thousand/uL (ref 3.8–10.8)

## 2024-01-26 LAB — COMPREHENSIVE METABOLIC PANEL WITH GFR
AG Ratio: 2 (calc) (ref 1.0–2.5)
ALT: 18 U/L (ref 6–29)
AST: 17 U/L (ref 10–35)
Albumin: 4.5 g/dL (ref 3.6–5.1)
Alkaline phosphatase (APISO): 82 U/L (ref 37–153)
BUN: 22 mg/dL (ref 7–25)
CO2: 29 mmol/L (ref 20–32)
Calcium: 10.1 mg/dL (ref 8.6–10.4)
Chloride: 103 mmol/L (ref 98–110)
Creat: 0.82 mg/dL (ref 0.50–1.05)
Globulin: 2.3 g/dL (ref 1.9–3.7)
Glucose, Bld: 88 mg/dL (ref 65–99)
Potassium: 4.7 mmol/L (ref 3.5–5.3)
Sodium: 138 mmol/L (ref 135–146)
Total Bilirubin: 0.4 mg/dL (ref 0.2–1.2)
Total Protein: 6.8 g/dL (ref 6.1–8.1)
eGFR: 77 mL/min/1.73m2 (ref >=60)

## 2024-01-27 ENCOUNTER — Ambulatory Visit: Payer: Self-pay | Admitting: Physician Assistant

## 2024-01-27 NOTE — Progress Notes (Signed)
 CBC and CMP WNL

## 2024-01-28 ENCOUNTER — Other Ambulatory Visit

## 2024-02-03 ENCOUNTER — Telehealth: Payer: Self-pay

## 2024-02-03 NOTE — Telephone Encounter (Signed)
 Copied from CRM #[id number]. Topic: Clinical - Order For Equipment >> Feb 03, 2024  1:40 PM Isabell A wrote: Reason for CRM: Patient requesting a prescription for her CPAP supplies sent to [facility].   Fax # [phone]    Callback number: [phone]

## 2024-02-04 NOTE — Telephone Encounter (Signed)
 Left a message for the patient. She has not been seen in our office in over a year. She will need an appt with Dr. Jess to establish care and order new supplies.

## 2024-02-07 ENCOUNTER — Other Ambulatory Visit: Payer: Self-pay

## 2024-02-07 ENCOUNTER — Other Ambulatory Visit: Payer: Self-pay | Admitting: Rheumatology

## 2024-02-07 DIAGNOSIS — L409 Psoriasis, unspecified: Secondary | ICD-10-CM

## 2024-02-07 DIAGNOSIS — L4050 Arthropathic psoriasis, unspecified: Secondary | ICD-10-CM

## 2024-02-07 MED ORDER — TALTZ 80 MG/ML ~~LOC~~ SOAJ
SUBCUTANEOUS | 0 refills | Status: DC
  Filled 2024-02-07: qty 3, fill #0
  Filled 2024-02-09: qty 1, 28d supply, fill #0
  Filled 2024-03-06: qty 1, 28d supply, fill #1
  Filled 2024-04-04: qty 1, 28d supply, fill #2

## 2024-02-07 NOTE — Telephone Encounter (Signed)
 Last Fill: 11/16/2023  Labs: 01/26/2024 CBC and CMP WNL   TB Gold: 07/23/2023 Neg   Next Visit: 06/21/2024  Last Visit: 01/26/2024  IK:Ednmpjupr arthritis   Current Dose per office note 01/26/2024: Taltz  80 mg sq injections once every 4 weeks   Okay to refill Taltz ?

## 2024-02-08 ENCOUNTER — Other Ambulatory Visit: Payer: Self-pay

## 2024-02-08 NOTE — Telephone Encounter (Signed)
 Lm x2 and I have mailed her a letter.  Closing this encounter per office protocol.

## 2024-02-09 ENCOUNTER — Ambulatory Visit: Admitting: Sleep Medicine

## 2024-02-09 ENCOUNTER — Other Ambulatory Visit: Payer: Self-pay

## 2024-02-09 ENCOUNTER — Encounter: Payer: Self-pay | Admitting: Sleep Medicine

## 2024-02-09 VITALS — BP 132/84 | HR 78 | Ht 67.0 in | Wt 197.0 lb

## 2024-02-09 DIAGNOSIS — G4733 Obstructive sleep apnea (adult) (pediatric): Secondary | ICD-10-CM | POA: Diagnosis not present

## 2024-02-09 NOTE — Progress Notes (Signed)
 Name:Jillian [name] MRN: [id number] DOB: 13-Jun-1955   CHIEF COMPLAINT:  CPAP F/U   HISTORY OF PRESENT ILLNESS: Jillian [name] is a 68 y.o. w/ a h/o OSA, fibromyalgia and psoriatic arthritis and obesity who presents for CPAP F/U visit. Reports using CPAP therapy almost every night, which is confirmed by compliance data. She is using the Airfit F20 FFM, which is comfortable. Reports feeling more refreshed upon awakening with CPAP therapy.   PAST MEDICAL HISTORY :   has a past medical history of Anxiety, Cancer (HCC), Dermatitis, Fibromyalgia, Prediabetes (12/03/2017), Primary localized osteoarthritis of right knee (02/07/2019), Sleep apnea, Thrush, and UTI (urinary tract infection).  has a past surgical history that includes Hernia repair; Cesarean section; Partial knee arthroplasty (Right, 02/07/2019); Partial knee arthroplasty (Left, 03/21/2019); Eye surgery (Bilateral, 04/24/2020); Esophagogastroduodenoscopy (N/A, 02/05/2021); biopsy (02/05/2021); Upper gi endoscopy (03/2021); Upper gi endoscopy (N/A, 06/03/2021); Gastric bypass (06/03/2021); Colonoscopy with propofol  (N/A, 10/21/2022); Cataract extraction w/PHACO (Left, 12/15/2022); and Cataract extraction w/PHACO (Right, 12/29/2022). Prior to Admission medications   Medication Sig Start Date End Date Taking? Authorizing Provider  acetaminophen  (TYLENOL ) 500 MG tablet Take 500 mg by mouth every 6 (six) hours as needed.   Yes [provider]  augmented betamethasone  dipropionate (DIPROLENE -AF) 0.05 % ointment Apply topically 2 (two) times daily as needed. 08/25/22  Yes Deveshwar, Maya, MD  buPROPion  (WELLBUTRIN  XL) 150 MG 24 hr tablet TAKE 1 TABLET(150 MG) BY MOUTH DAILY 11/26/23  Yes Bedsole, Amy E, MD  Calcium Carbonate (CALCIUM 500 PO) Take 1,000 mg by mouth daily.   Yes [provider]  cholecalciferol  (VITAMIN D3) 25 MCG (1000 UNIT) tablet Take 1,000 Units by mouth daily.   Yes [provider]  clobetasol  (TEMOVATE) 0.05 % external solution Apply 1 mL to affected areas of skin twice daily 01/03/24  Yes Kitts, Lauraine BROCKS, MD  clobetasol ointment (TEMOVATE) 0.05 % Apply 1 gram topically to affected area of skin twice daily. Stop once resolved and restart as needed for flares. Avoid use on face, armpits, groin unless otherwise indicated. 01/03/24  Yes Kitts, Lauraine BROCKS, MD  cyclobenzaprine  (FLEXERIL ) 5 MG tablet TAKE 1 TABLET(5 MG) BY MOUTH AT BEDTIME 11/30/23  Yes Bedsole, Amy E, MD  DULoxetine  (CYMBALTA ) 30 MG capsule TAKE 3 CAPSULES BY MOUTH EVERY DAY 09/27/23  Yes Bedsole, Amy E, MD  fluocinonide  (LIDEX ) 0.05 % external solution Apply 1 Application topically 2 (two) times daily as needed (PSORIASIS). 03/16/23  Yes Cheryl Waddell CHRISTELLA, PA-C  ibuprofen (ADVIL) 200 MG tablet Take 200 mg by mouth every 6 (six) hours as needed.   Yes [provider]  ixekizumab  (TALTZ ) 80 MG/ML pen INJECT 80MG  (1 PEN) UNDER THE SKIN EVERY 4 WEEKS 02/07/24  Yes Cheryl Waddell CHRISTELLA, PA-C  Multiple Vitamins-Minerals (BARIATRIC MULTIVITAMINS/IRON PO) Take 1 capsule by mouth daily.   Yes [provider]  pregabalin  (LYRICA ) 150 MG capsule Take 1 capsule (150 mg total) by mouth 3 (three) times daily. 01/19/24  Yes Bedsole, Amy E, MD  senna-docusate (SENOKOT S) 8.6-50 MG tablet Take 1 tablet by mouth daily.   Yes [provider]  tacrolimus (PROTOPIC) 0.1 % ointment Apply 2 grams twice daily to affected areas of skin 01/03/24  Yes Kitts, Lauraine BROCKS, MD  traMADol  (ULTRAM ) 50 MG tablet Take 1 tablet (50 mg total) by mouth every 8 (eight) hours as needed. 01/18/24  Yes Bedsole, Amy E, MD  traZODone  (DESYREL ) 50 MG tablet Take 2 tablets (100 mg total) by  mouth at bedtime as needed for sleep. 08/27/23  Yes Bedsole, Amy E, MD   No Known Allergies  FAMILY HISTORY:  family history includes Anuerysm in her paternal grandfather; Arthritis in her brother; Cancer in her father, maternal grandmother, and paternal aunt; Diabetes in her  brother and sister; Healthy in her daughter and son; Hyperlipidemia in her father; Hypertension in her father and maternal grandmother; Osteoarthritis in her brother; Psoriasis in her brother and sister; Stroke in her brother; Throat cancer in her brother. SOCIAL HISTORY:  reports that she quit smoking about 31 years ago. Her smoking use included cigarettes. She started smoking about 46 years ago. She has a 15 pack-year smoking history. She has been exposed to tobacco smoke. She has never used smokeless tobacco. She reports that she does not drink alcohol and does not use drugs.   Review of Systems:  Gen:  Denies  fever, sweats, chills weight loss  HEENT: Denies blurred vision, double vision, ear pain, eye pain, hearing loss, nose bleeds, sore throat Cardiac:  No dizziness, chest pain or heaviness, chest tightness,edema, No JVD Resp:   No cough, -sputum production, -shortness of breath,-wheezing, -hemoptysis,  Gi: Denies swallowing difficulty, stomach pain, nausea or vomiting, diarrhea, constipation, bowel incontinence Gu:  Denies bladder incontinence, burning urine Ext:   Denies Joint pain, stiffness or swelling Skin: Denies  skin rash, easy bruising or bleeding or hives Endoc:  Denies polyuria, polydipsia , polyphagia or weight change Psych:   Denies depression, insomnia or hallucinations  Other:  All other systems negative  VITAL SIGNS: BP 132/84   Pulse 78   Ht 5' 7 (1.702 m)   Wt 197 lb (89.4 kg)   SpO2 95%   BMI 30.85 kg/m    Physical Examination:   General Appearance: No distress  EYES PERRLA, EOM intact.   NECK Supple, No JVD Pulmonary: normal breath sounds, No wheezing.  CardiovascularNormal S1,S2.  No m/r/g.   Abdomen: Benign, Soft, non-tender. Skin:   warm, no rashes, no ecchymosis  Extremities: normal, no cyanosis, clubbing. Neuro:without focal findings,  speech normal  PSYCHIATRIC: Mood, affect within normal limits.   ASSESSMENT AND PLAN  OSA Patient is  using and benefiting from CPAP therapy. Discussed the consequences of untreated sleep apnea. Advised not to drive drowsy for safety of patient and others. Will follow up in 1 year.    Patient  satisfied with Plan of action and management. All questions answered  I spent a total of 34 minutes reviewing chart data, face-to-face evaluation with the patient, counseling and coordination of care as detailed above.    Ilias Stcharles, M.D.  Sleep Medicine [facility]

## 2024-02-09 NOTE — Patient Instructions (Signed)

## 2024-02-11 ENCOUNTER — Other Ambulatory Visit: Payer: Self-pay

## 2024-02-11 NOTE — Progress Notes (Signed)
 Specialty Pharmacy Refill Coordination Note  Jillian [name] is a 68 y.o. adult contacted today regarding refills of specialty medication(s) Ixekizumab  (Taltz )   Patient requested Delivery   Delivery date: 02/15/24   Verified address: [address]  [place]   Medication will be filled on: 02/14/24

## 2024-02-14 ENCOUNTER — Other Ambulatory Visit: Payer: Self-pay

## 2024-02-26 ENCOUNTER — Other Ambulatory Visit: Payer: Self-pay | Admitting: Family Medicine

## 2024-02-29 ENCOUNTER — Encounter (INDEPENDENT_AMBULATORY_CARE_PROVIDER_SITE_OTHER): Payer: Self-pay

## 2024-03-06 ENCOUNTER — Other Ambulatory Visit (HOSPITAL_COMMUNITY): Payer: Self-pay

## 2024-03-08 ENCOUNTER — Other Ambulatory Visit: Payer: Self-pay

## 2024-03-08 NOTE — Progress Notes (Signed)
 Specialty Pharmacy Refill Coordination Note  Jillian [name] is a 68 y.o. adult contacted today regarding refills of specialty medication(s) Ixekizumab  (Taltz )   Patient requested Delivery   Delivery date: 03/14/24   Verified address: [address]  [name]   Medication will be filled on: 03/13/24

## 2024-03-13 ENCOUNTER — Other Ambulatory Visit: Payer: Self-pay

## 2024-03-17 ENCOUNTER — Other Ambulatory Visit: Payer: Self-pay | Admitting: Family Medicine

## 2024-03-17 NOTE — Telephone Encounter (Signed)
 Last office visit for CPE.  Last refilled 01/19/2024 for #90 with 1 refill.  Next appt: CPE 08/30/2024.

## 2024-03-20 ENCOUNTER — Other Ambulatory Visit: Payer: Self-pay | Admitting: Family Medicine

## 2024-03-20 NOTE — Telephone Encounter (Signed)
 Copied from CRM #[id number]. Topic: Clinical - Medication Refill >> Mar 20, 2024  4:12 PM Shereese L wrote: Medication: cyclobenzaprine  (FLEXERIL ) 5 MG tablet DULoxetine  (CYMBALTA ) 30 MG capsule traZODone  (DESYREL ) 50 MG tablet traMADol  (ULTRAM ) 50 MG tablet  Has the patient contacted their pharmacy? Yes (Agent: If no, request that the patient contact the pharmacy for the refill. If patient does not wish to contact the pharmacy document the reason why and proceed with request.) (Agent: If yes, when and what did the pharmacy advise?)  This is the patient's preferred pharmacy:  [facility] [address]), [place] - [address] [place]) [place] [address] Phone: [phone] Fax: [phone]  Is this the correct pharmacy for this prescription? Yes If no, delete pharmacy and type the correct one.   Has the prescription been filled recently? Yes  Is the patient out of the medication? Yes  Has the patient been seen for an appointment in the last year OR does the patient have an upcoming appointment? Yes  Can we respond through MyChart? Yes  Agent: Please be advised that Rx refills may take up to 3 business days. We ask that you follow-up with your pharmacy.

## 2024-03-21 ENCOUNTER — Other Ambulatory Visit: Payer: Self-pay | Admitting: Family Medicine

## 2024-03-21 ENCOUNTER — Encounter: Payer: Self-pay | Admitting: Family Medicine

## 2024-03-22 MED ORDER — CYCLOBENZAPRINE HCL 5 MG PO TABS: 5.0000 mg | ORAL_TABLET | Freq: Every day | ORAL | 0 refills | Status: AC

## 2024-03-22 MED ORDER — DULOXETINE HCL 30 MG PO CPEP: 90.0000 mg | ORAL_CAPSULE | Freq: Every day | ORAL | 1 refills | Status: AC

## 2024-03-22 MED ORDER — TRAZODONE HCL 50 MG PO TABS: 100.0000 mg | ORAL_TABLET | Freq: Every evening | ORAL | 1 refills | Status: AC | PRN

## 2024-03-22 NOTE — Telephone Encounter (Addendum)
 Last office visit 08/27/2023 for CPE.  Last refilled  Tramadol  01/18/2024 for #180 with no refill Cyclobenzaprine  11/30/23 for #30 with no refills.  Next appt: CPE 08/30/2024.

## 2024-03-24 ENCOUNTER — Other Ambulatory Visit: Payer: Self-pay | Admitting: Family Medicine

## 2024-04-03 ENCOUNTER — Telehealth: Payer: Self-pay | Admitting: Pharmacist

## 2024-04-03 NOTE — Telephone Encounter (Signed)
 Submitted a Prior Authorization request to [employer] for TALTZ  via CoverMyMeds. Will update once we receive a response.  Key: [id number]    This medication or product was previously approved on [id number] from 2024-03-30 to 2025-03-29.

## 2024-04-04 ENCOUNTER — Other Ambulatory Visit: Payer: Self-pay

## 2024-04-06 ENCOUNTER — Other Ambulatory Visit: Payer: Self-pay

## 2024-04-10 ENCOUNTER — Other Ambulatory Visit: Payer: Self-pay

## 2024-04-10 NOTE — Progress Notes (Signed)
 Clinical Intervention Note  Clinical Intervention Notes: Patient reported new medication, Linzess. No DDIs identified with Taltz .   Clinical Intervention Outcomes: Prevention of an adverse drug event   Advertising Account Planner

## 2024-04-10 NOTE — Progress Notes (Signed)
 Specialty Pharmacy Refill Coordination Note  Jillian [name] is a 69 y.o. adult contacted today regarding refills of specialty medication(s) Ixekizumab  (Taltz )   Patient requested Delivery   Delivery date: 04/12/24   Verified address: [address]  [name]   Medication will be filled on: 04/11/24

## 2024-04-11 ENCOUNTER — Other Ambulatory Visit: Payer: Self-pay

## 2024-04-11 ENCOUNTER — Other Ambulatory Visit (HOSPITAL_COMMUNITY): Payer: Self-pay

## 2024-04-18 MED ORDER — PREGABALIN 150 MG PO CAPS: 150.0000 mg | ORAL_CAPSULE | Freq: Three times a day (TID) | ORAL | 0 refills | Status: AC

## 2024-04-18 MED ORDER — TRAMADOL HCL 50 MG PO TABS: 50.0000 mg | ORAL_TABLET | Freq: Three times a day (TID) | ORAL | 0 refills | Status: AC | PRN

## 2024-04-18 NOTE — Addendum Note (Signed)
 Addended by: AVELINA NO E on: 04/18/2024 05:32 PM   Modules accepted: Orders

## 2024-05-02 ENCOUNTER — Other Ambulatory Visit: Payer: Self-pay | Admitting: Physician Assistant

## 2024-05-02 ENCOUNTER — Other Ambulatory Visit: Payer: Self-pay

## 2024-05-02 DIAGNOSIS — L409 Psoriasis, unspecified: Secondary | ICD-10-CM

## 2024-05-02 DIAGNOSIS — L4050 Arthropathic psoriasis, unspecified: Secondary | ICD-10-CM

## 2024-05-02 MED ORDER — TALTZ 80 MG/ML ~~LOC~~ SOAJ
SUBCUTANEOUS | 0 refills | Status: AC
  Filled 2024-05-02: qty 1, fill #0
  Filled 2024-05-03: qty 1, 28d supply, fill #0

## 2024-05-03 ENCOUNTER — Other Ambulatory Visit: Payer: Self-pay

## 2024-05-03 ENCOUNTER — Other Ambulatory Visit (HOSPITAL_COMMUNITY): Payer: Self-pay

## 2024-05-05 ENCOUNTER — Other Ambulatory Visit: Payer: Self-pay

## 2024-06-21 ENCOUNTER — Ambulatory Visit: Admitting: Physician Assistant

## 2024-08-23 ENCOUNTER — Other Ambulatory Visit

## 2024-08-24 ENCOUNTER — Ambulatory Visit

## 2024-08-29 ENCOUNTER — Ambulatory Visit

## 2024-08-30 ENCOUNTER — Encounter: Admitting: Family Medicine

## 2025-01-03 ENCOUNTER — Ambulatory Visit
